# Patient Record
Sex: Male | Born: 1945 | Race: Black or African American | Hispanic: No | Marital: Married | Smoking: Former smoker
Health system: Southern US, Community
[De-identification: ages and names within clinical notes are randomized; demographics above are authoritative.]

## PROBLEM LIST (undated history)

## (undated) DIAGNOSIS — R011 Cardiac murmur, unspecified: Secondary | ICD-10-CM

## (undated) DIAGNOSIS — N2889 Other specified disorders of kidney and ureter: Secondary | ICD-10-CM

## (undated) DIAGNOSIS — G629 Polyneuropathy, unspecified: Secondary | ICD-10-CM

## (undated) DIAGNOSIS — E878 Other disorders of electrolyte and fluid balance, not elsewhere classified: Secondary | ICD-10-CM

## (undated) DIAGNOSIS — E119 Type 2 diabetes mellitus without complications: Secondary | ICD-10-CM

## (undated) DIAGNOSIS — C9000 Multiple myeloma not having achieved remission: Secondary | ICD-10-CM

## (undated) DIAGNOSIS — D649 Anemia, unspecified: Secondary | ICD-10-CM

## (undated) DIAGNOSIS — Z95810 Presence of automatic (implantable) cardiac defibrillator: Secondary | ICD-10-CM

## (undated) DIAGNOSIS — I509 Heart failure, unspecified: Secondary | ICD-10-CM

## (undated) DIAGNOSIS — D472 Monoclonal gammopathy: Secondary | ICD-10-CM

## (undated) DIAGNOSIS — I447 Left bundle-branch block, unspecified: Secondary | ICD-10-CM

## (undated) DIAGNOSIS — N189 Chronic kidney disease, unspecified: Secondary | ICD-10-CM

## (undated) DIAGNOSIS — C801 Malignant (primary) neoplasm, unspecified: Secondary | ICD-10-CM

## (undated) DIAGNOSIS — I10 Essential (primary) hypertension: Secondary | ICD-10-CM

## (undated) DIAGNOSIS — Z950 Presence of cardiac pacemaker: Secondary | ICD-10-CM

## (undated) DIAGNOSIS — I499 Cardiac arrhythmia, unspecified: Secondary | ICD-10-CM

## (undated) DIAGNOSIS — I240 Acute coronary thrombosis not resulting in myocardial infarction: Secondary | ICD-10-CM

## (undated) HISTORY — DX: Malignant (primary) neoplasm, unspecified: C80.1

## (undated) HISTORY — PX: CARDIAC SURGERY: SHX584

## (undated) HISTORY — PX: OTHER SURGICAL HISTORY: SHX169

## (undated) HISTORY — PX: PACEMAKER PLACEMENT: SHX43

## (undated) HISTORY — PX: CORONARY ARTERY BYPASS GRAFT: SHX141

---

## 2005-10-22 ENCOUNTER — Ambulatory Visit: Payer: Self-pay | Admitting: Family Medicine

## 2010-06-28 ENCOUNTER — Emergency Department: Payer: Self-pay | Admitting: Emergency Medicine

## 2011-05-28 DIAGNOSIS — I7090 Unspecified atherosclerosis: Secondary | ICD-10-CM | POA: Insufficient documentation

## 2012-03-17 DIAGNOSIS — H3500 Unspecified background retinopathy: Secondary | ICD-10-CM | POA: Insufficient documentation

## 2012-03-17 DIAGNOSIS — I10 Essential (primary) hypertension: Secondary | ICD-10-CM | POA: Insufficient documentation

## 2012-03-17 DIAGNOSIS — R809 Proteinuria, unspecified: Secondary | ICD-10-CM | POA: Insufficient documentation

## 2012-03-17 DIAGNOSIS — I447 Left bundle-branch block, unspecified: Secondary | ICD-10-CM | POA: Insufficient documentation

## 2012-03-17 DIAGNOSIS — R972 Elevated prostate specific antigen [PSA]: Secondary | ICD-10-CM | POA: Insufficient documentation

## 2012-03-18 DIAGNOSIS — Z7689 Persons encountering health services in other specified circumstances: Secondary | ICD-10-CM | POA: Insufficient documentation

## 2012-03-18 DIAGNOSIS — D649 Anemia, unspecified: Secondary | ICD-10-CM | POA: Insufficient documentation

## 2012-03-18 DIAGNOSIS — N390 Urinary tract infection, site not specified: Secondary | ICD-10-CM | POA: Insufficient documentation

## 2012-03-18 DIAGNOSIS — A419 Sepsis, unspecified organism: Secondary | ICD-10-CM | POA: Insufficient documentation

## 2012-04-26 ENCOUNTER — Emergency Department: Payer: Self-pay | Admitting: Internal Medicine

## 2012-05-06 ENCOUNTER — Emergency Department: Payer: Self-pay | Admitting: Internal Medicine

## 2012-05-06 LAB — URINALYSIS, COMPLETE
Bacteria: NONE SEEN
Blood: NEGATIVE
Glucose,UR: NEGATIVE mg/dL (ref 0–75)
Ketone: NEGATIVE
Leukocyte Esterase: NEGATIVE
Nitrite: NEGATIVE
Protein: NEGATIVE
Specific Gravity: 1.009 (ref 1.003–1.030)
WBC UR: <1 /HPF (ref 0–5)

## 2012-06-12 DIAGNOSIS — R972 Elevated prostate specific antigen [PSA]: Secondary | ICD-10-CM | POA: Insufficient documentation

## 2012-06-12 DIAGNOSIS — N400 Enlarged prostate without lower urinary tract symptoms: Secondary | ICD-10-CM | POA: Insufficient documentation

## 2012-10-27 ENCOUNTER — Ambulatory Visit: Payer: Self-pay | Admitting: Family Medicine

## 2012-12-24 ENCOUNTER — Ambulatory Visit: Payer: Self-pay | Admitting: Unknown Physician Specialty

## 2013-01-01 DIAGNOSIS — K635 Polyp of colon: Secondary | ICD-10-CM | POA: Insufficient documentation

## 2014-05-11 DIAGNOSIS — I420 Dilated cardiomyopathy: Secondary | ICD-10-CM | POA: Insufficient documentation

## 2014-05-21 ENCOUNTER — Ambulatory Visit: Payer: Self-pay | Admitting: Family Medicine

## 2014-09-02 ENCOUNTER — Ambulatory Visit: Payer: Self-pay | Admitting: Physician Assistant

## 2014-10-18 DIAGNOSIS — N390 Urinary tract infection, site not specified: Secondary | ICD-10-CM | POA: Insufficient documentation

## 2014-10-18 DIAGNOSIS — N179 Acute kidney failure, unspecified: Secondary | ICD-10-CM | POA: Insufficient documentation

## 2014-10-18 DIAGNOSIS — N12 Tubulo-interstitial nephritis, not specified as acute or chronic: Secondary | ICD-10-CM | POA: Insufficient documentation

## 2014-10-25 DIAGNOSIS — I5022 Chronic systolic (congestive) heart failure: Secondary | ICD-10-CM | POA: Insufficient documentation

## 2015-03-02 DIAGNOSIS — I10 Essential (primary) hypertension: Secondary | ICD-10-CM | POA: Insufficient documentation

## 2015-03-22 DIAGNOSIS — I340 Nonrheumatic mitral (valve) insufficiency: Secondary | ICD-10-CM | POA: Insufficient documentation

## 2015-03-30 DIAGNOSIS — D509 Iron deficiency anemia, unspecified: Secondary | ICD-10-CM | POA: Insufficient documentation

## 2015-03-30 DIAGNOSIS — I10 Essential (primary) hypertension: Secondary | ICD-10-CM | POA: Insufficient documentation

## 2015-03-30 DIAGNOSIS — R0989 Other specified symptoms and signs involving the circulatory and respiratory systems: Secondary | ICD-10-CM | POA: Insufficient documentation

## 2015-04-05 ENCOUNTER — Other Ambulatory Visit: Payer: Self-pay | Admitting: Family Medicine

## 2015-04-05 DIAGNOSIS — R0989 Other specified symptoms and signs involving the circulatory and respiratory systems: Secondary | ICD-10-CM

## 2015-04-11 ENCOUNTER — Ambulatory Visit: Payer: Medicare Other

## 2015-04-11 DIAGNOSIS — I5041 Acute combined systolic (congestive) and diastolic (congestive) heart failure: Secondary | ICD-10-CM | POA: Insufficient documentation

## 2015-04-12 DIAGNOSIS — Z95810 Presence of automatic (implantable) cardiac defibrillator: Secondary | ICD-10-CM | POA: Insufficient documentation

## 2015-05-09 DIAGNOSIS — R600 Localized edema: Secondary | ICD-10-CM | POA: Insufficient documentation

## 2015-05-09 DIAGNOSIS — Z95810 Presence of automatic (implantable) cardiac defibrillator: Secondary | ICD-10-CM | POA: Insufficient documentation

## 2015-10-21 ENCOUNTER — Ambulatory Visit
Admission: EM | Admit: 2015-10-21 | Discharge: 2015-10-21 | Disposition: A | Discharge status: Home / Self Care | Payer: Medicare Other | Attending: Family Medicine | Admitting: Family Medicine

## 2015-10-21 DIAGNOSIS — N390 Urinary tract infection, site not specified: Secondary | ICD-10-CM | POA: Diagnosis not present

## 2015-10-21 HISTORY — DX: Type 2 diabetes mellitus without complications: E11.9

## 2015-10-21 HISTORY — DX: Anemia, unspecified: D64.9

## 2015-10-21 HISTORY — DX: Other disorders of electrolyte and fluid balance, not elsewhere classified: E87.8

## 2015-10-21 HISTORY — DX: Polyneuropathy, unspecified: G62.9

## 2015-10-21 HISTORY — DX: Acute coronary thrombosis not resulting in myocardial infarction: I24.0

## 2015-10-21 LAB — URINALYSIS COMPLETE WITH MICROSCOPIC (ARMC ONLY)
BILIRUBIN URINE: NEGATIVE
GLUCOSE, UA: NEGATIVE mg/dL
Hgb urine dipstick: NEGATIVE
KETONES UR: NEGATIVE mg/dL
NITRITE: NEGATIVE
Protein, ur: 100 mg/dL — AB
RBC / HPF: NONE SEEN RBC/hpf (ref 0–5)
SQUAMOUS EPITHELIAL / LPF: NONE SEEN
Specific Gravity, Urine: 1.015 (ref 1.005–1.030)
pH: 7 (ref 5.0–8.0)

## 2015-10-21 MED ORDER — CIPROFLOXACIN HCL 500 MG PO TABS: 500.0000 mg | ORAL_TABLET | Freq: Two times a day (BID) | ORAL | Status: DC

## 2015-10-21 NOTE — ED Provider Notes (Signed)
CSN: [id number]     Arrival date & time 10/21/15  0809 History   First MD Initiated Contact with Patient 10/21/15 0848     Chief Complaint  Patient presents with  . Hematuria   (Consider location/radiation/quality/duration/timing/severity/associated sxs/prior Treatment) Patient is a 70 y.o. male presenting with hematuria.  Hematuria This is a new problem. The current episode started yesterday. Episode frequency: intermittently. Pertinent negatives include no chest pain, no abdominal pain, no headaches and no shortness of breath. Associated symptoms comments: Denies abdominal pain, fevers, chills, flank pain, dysuria.    Past Medical History  Diagnosis Date  . Diabetes mellitus without complication ([place])   . Anemia   . Blockage of coronary artery of heart ([place])   . Hypochloremia   . Neuropathy [facility])    Past Surgical History  Procedure Laterality Date  . Cardiac surgery    . Heart bypass    . Pacemaker placement     Family History  Problem Relation Age of Onset  . Heart failure Paternal Grandmother    Social History  Substance Use Topics  . Smoking status: Former Research scientist (life sciences)  . Smokeless tobacco: Never Used  . Alcohol Use: No    Review of Systems  Respiratory: Negative for shortness of breath.   Cardiovascular: Negative for chest pain.  Gastrointestinal: Negative for abdominal pain.  Genitourinary: Positive for hematuria.  Neurological: Negative for headaches.    Allergies  Review of patient's allergies indicates no known allergies.  Home Medications   Prior to Admission medications   Medication Sig Start Date End Date Taking? Authorizing Provider  aspirin 81 MG tablet Take 81 mg by mouth daily.   Yes Historical Provider, MD  atorvastatin (LIPITOR) 10 MG tablet Take 10 mg by mouth daily.   Yes Historical Provider, MD  carvedilol (COREG) 25 MG tablet Take 25 mg by mouth 2 (two) times daily with a meal.   Yes Historical Provider, MD  Cyanocobalamin (VITAMIN B-12 PO)  Take by mouth.   Yes Historical Provider, MD  ferrous gluconate (FERGON) 324 MG tablet Take 324 mg by mouth daily with breakfast.   Yes Historical Provider, MD  furosemide (LASIX) 20 MG tablet Take 20 mg by mouth.   Yes Historical Provider, MD  glimepiride (AMARYL) 4 MG tablet Take 4 mg by mouth daily with breakfast.   Yes Historical Provider, MD  hydrALAZINE (APRESOLINE) 25 MG tablet Take 25 mg by mouth 3 (three) times daily.   Yes Historical Provider, MD  isosorbide dinitrate (ISORDIL) 10 MG tablet Take 10 mg by mouth 3 (three) times daily.   Yes Historical Provider, MD  metFORMIN (GLUCOPHAGE) 1000 MG tablet Take 1,000 mg by mouth 2 (two) times daily with a meal.   Yes Historical Provider, MD  sacubitril-valsartan (ENTRESTO) 49-51 MG Take 1 tablet by mouth 2 (two) times daily.   Yes Historical Provider, MD  sitaGLIPtin (JANUVIA) 100 MG tablet Take 100 mg by mouth daily.   Yes Historical Provider, MD  vitamin C (ASCORBIC ACID) 500 MG tablet Take 500 mg by mouth daily.   Yes Historical Provider, MD  ciprofloxacin (CIPRO) 500 MG tablet Take 1 tablet (500 mg total) by mouth every 12 (twelve) hours. 10/21/15   Norval Gable, MD  gabapentin (NEURONTIN) 100 MG capsule Take 100 mg by mouth as needed.    Historical Provider, MD   Meds Ordered and Administered this Visit  Medications - No data to display  BP 102/60 mmHg  Pulse 80  Temp(Src) 98 F (36.7 C) (  Oral)  Resp 17  Ht 6' (1.829 m)  Wt 195 lb (88.451 kg)  BMI 26.44 kg/m2  SpO2 97% No data found.   Physical Exam  Constitutional: He is oriented to person, place, and time. He appears well-developed and well-nourished. No distress.  HENT:  Head: Normocephalic and atraumatic.  Cardiovascular: Normal rate.   Pulmonary/Chest: Effort normal. No respiratory distress.  Abdominal: Soft. Bowel sounds are normal. He exhibits no distension and no mass. There is no tenderness. There is no rebound and no guarding.  Neurological: He is alert and  oriented to person, place, and time.  Skin: No rash noted. He is not diaphoretic.  Nursing note and vitals reviewed.   ED Course  Procedures (including critical care time)  Labs Review Labs Reviewed  URINALYSIS COMPLETEWITH MICROSCOPIC ([facility] ONLY) - Abnormal; Notable for the following:    APPearance HAZY (*)    Protein, ur 100 (*)    Leukocytes, UA 1+ (*)    Bacteria, UA MANY (*)    All other components within normal limits  URINE CULTURE    Imaging Review No results found.   Visual Acuity Review  Right Eye Distance:   Left Eye Distance:   Bilateral Distance:    Right Eye Near:   Left Eye Near:    Bilateral Near:         MDM   1. UTI (lower urinary tract infection)    Discharge Medication List as of 10/21/2015  9:18 AM    START taking these medications   Details  ciprofloxacin (CIPRO) 500 MG tablet Take 1 tablet (500 mg total) by mouth every 12 (twelve) hours., Starting 10/21/2015, Until Discontinued, Normal       1. Lab results and diagnosis reviewed with patient 2. rx as per orders above; reviewed possible side effects, interactions, risks and benefits  3. Recommend supportive treatment with increased water intake 4. Follow-up prn if symptoms worsen or don't improve    Norval Gable, MD 10/21/15 [phone]

## 2015-10-21 NOTE — ED Notes (Signed)
Patient c/o blood in his urine which he noticed yesterday afternoon.  However, he states that this morning he did not notice any blood in his urine.

## 2015-10-24 LAB — URINE CULTURE: Culture: >=100000

## 2016-07-02 ENCOUNTER — Other Ambulatory Visit (INDEPENDENT_AMBULATORY_CARE_PROVIDER_SITE_OTHER): Payer: Self-pay | Admitting: Vascular Surgery

## 2016-07-02 DIAGNOSIS — M79604 Pain in right leg: Secondary | ICD-10-CM

## 2016-07-02 DIAGNOSIS — M79605 Pain in left leg: Secondary | ICD-10-CM

## 2016-07-02 DIAGNOSIS — I739 Peripheral vascular disease, unspecified: Secondary | ICD-10-CM

## 2016-07-08 ENCOUNTER — Ambulatory Visit (INDEPENDENT_AMBULATORY_CARE_PROVIDER_SITE_OTHER): Payer: Medicare Other

## 2016-07-08 ENCOUNTER — Ambulatory Visit (INDEPENDENT_AMBULATORY_CARE_PROVIDER_SITE_OTHER): Payer: Medicare Other | Admitting: Vascular Surgery

## 2016-07-08 ENCOUNTER — Encounter (INDEPENDENT_AMBULATORY_CARE_PROVIDER_SITE_OTHER): Payer: Self-pay | Admitting: Vascular Surgery

## 2016-07-08 DIAGNOSIS — E785 Hyperlipidemia, unspecified: Secondary | ICD-10-CM | POA: Insufficient documentation

## 2016-07-08 DIAGNOSIS — I739 Peripheral vascular disease, unspecified: Secondary | ICD-10-CM

## 2016-07-08 DIAGNOSIS — M79604 Pain in right leg: Secondary | ICD-10-CM | POA: Diagnosis not present

## 2016-07-08 DIAGNOSIS — E782 Mixed hyperlipidemia: Secondary | ICD-10-CM | POA: Diagnosis not present

## 2016-07-08 DIAGNOSIS — I25118 Atherosclerotic heart disease of native coronary artery with other forms of angina pectoris: Secondary | ICD-10-CM | POA: Diagnosis not present

## 2016-07-08 DIAGNOSIS — I70213 Atherosclerosis of native arteries of extremities with intermittent claudication, bilateral legs: Secondary | ICD-10-CM | POA: Diagnosis not present

## 2016-07-08 DIAGNOSIS — E1151 Type 2 diabetes mellitus with diabetic peripheral angiopathy without gangrene: Secondary | ICD-10-CM | POA: Diagnosis not present

## 2016-07-08 DIAGNOSIS — E119 Type 2 diabetes mellitus without complications: Secondary | ICD-10-CM | POA: Insufficient documentation

## 2016-07-08 DIAGNOSIS — I2510 Atherosclerotic heart disease of native coronary artery without angina pectoris: Secondary | ICD-10-CM | POA: Insufficient documentation

## 2016-07-08 DIAGNOSIS — I70219 Atherosclerosis of native arteries of extremities with intermittent claudication, unspecified extremity: Secondary | ICD-10-CM | POA: Insufficient documentation

## 2016-07-08 DIAGNOSIS — M79605 Pain in left leg: Secondary | ICD-10-CM | POA: Diagnosis not present

## 2016-07-08 NOTE — Progress Notes (Signed)
MRN : [id number]  Adam Wall is a 70 y.o. ([birth year]) male who presents with chief complaint of  Chief Complaint  Patient presents with  . Follow-up  .  History of Present Illness:The patient returns to the office for followup and review of the noninvasive studies. There have been no interval changes in lower extremity symptoms. No interval shortening of the patient's claudication distance or development of rest pain symptoms. No new ulcers or wounds have occurred since the last visit.  There have been no significant changes to the patient's overall health care.  The patient denies amaurosis fugax or recent TIA symptoms. There are no recent neurological changes noted. The patient denies history of DVT, PE or superficial thrombophlebitis. The patient denies recent episodes of angina or shortness of breath.     Current Meds  Medication Sig  . aspirin 81 MG tablet Take 81 mg by mouth daily.  Marland Kitchen atorvastatin (LIPITOR) 10 MG tablet Take 10 mg by mouth daily.  . carvedilol (COREG) 25 MG tablet Take 25 mg by mouth 2 (two) times daily with a meal.  . Cyanocobalamin (VITAMIN B-12 PO) Take by mouth.  . ferrous gluconate (FERGON) 324 MG tablet Take 324 mg by mouth daily with breakfast.  . furosemide (LASIX) 20 MG tablet Take 20 mg by mouth.  . gabapentin (NEURONTIN) 100 MG capsule Take 100 mg by mouth as needed.  Marland Kitchen glimepiride (AMARYL) 4 MG tablet Take 4 mg by mouth daily with breakfast.  . hydrALAZINE (APRESOLINE) 25 MG tablet Take 25 mg by mouth 3 (three) times daily.  . isosorbide dinitrate (ISORDIL) 10 MG tablet Take 10 mg by mouth 3 (three) times daily.  . metFORMIN (GLUCOPHAGE) 1000 MG tablet Take 1,000 mg by mouth 2 (two) times daily with a meal.  . sacubitril-valsartan (ENTRESTO) 49-51 MG Take 1 tablet by mouth 2 (two) times daily.  . sitaGLIPtin (JANUVIA) 100 MG tablet Take 100 mg by mouth daily.  . vitamin C (ASCORBIC ACID) 500 MG tablet Take 500 mg by mouth daily.     Past Medical History:  Diagnosis Date  . Anemia   . Blockage of coronary artery of heart ([place])   . Diabetes mellitus without complication ([place])   . Hypochloremia   . Neuropathy [facility])     Past Surgical History:  Procedure Laterality Date  . CARDIAC SURGERY    . Heart Bypass    . PACEMAKER PLACEMENT      Social History Social History  Substance Use Topics  . Smoking status: Former Research scientist (life sciences)  . Smokeless tobacco: Never Used  . Alcohol use No    Family History Family History  Problem Relation Age of Onset  . Heart failure Paternal Grandmother     No Known Allergies   REVIEW OF SYSTEMS (Negative unless checked)  Constitutional: [] Weight loss  [] Fever  [] Chills Cardiac: [] Chest pain   [] Chest pressure   [] Palpitations   [] Shortness of breath when laying flat   [] Shortness of breath with exertion. Vascular:  [] Pain in legs with walking   [] Pain in legs at rest  [] History of DVT   [] Phlebitis   [] Swelling in legs   [] Varicose veins   [] Non-healing ulcers Pulmonary:   [] Uses home oxygen   [] Productive cough   [] Hemoptysis   [] Wheeze  [] COPD   [] Asthma Neurologic:  [] Dizziness   [] Seizures   [] History of stroke   [] History of TIA  [] Aphasia   [] Vissual changes   [] Weakness or numbness in arm   []   Weakness or numbness in leg Musculoskeletal:   [] Joint swelling   [] Joint pain   [] Low back pain Hematologic:  [] Easy bruising  [] Easy bleeding   [] Hypercoagulable state   [] Anemic Gastrointestinal:  [] Diarrhea   [] Vomiting  [] Gastroesophageal reflux/heartburn   [] Difficulty swallowing. Genitourinary:  [] Chronic kidney disease   [] Difficult urination  [] Frequent urination   [] Blood in urine Skin:  [] Rashes   [] Ulcers  Psychological:  [] History of anxiety   []  History of major depression.  Physical Examination  Vitals:   07/08/16 0912  BP: (!) 141/72  Pulse: 76  Resp: 17  Weight: 194 lb (88 kg)  Height: 6' (1.829 m)   Body mass index is 26.31 kg/m. Gen: WD/WN, NAD Head:  [place]/AT, No temporalis wasting.  Ear/Nose/Throat: Hearing grossly intact, nares w/o erythema or drainage, poor dentition Eyes: PER, EOMI, sclera nonicteric.  Neck: Supple, no masses.  No bruit or JVD.  Pulmonary:  Good air movement, clear to auscultation bilaterally, no use of accessory muscles.  Cardiac: RRR, normal S1, S2, no Murmurs. Vascular:  Vessel Right Left  Radial Palpable Palpable  Ulnar Palpable Palpable  Brachial Palpable Palpable  Carotid Palpable Palpable  Femoral Palpable Palpable  Popliteal Not Palpable Not Palpable  PT Not Palpable Not Palpable  DP Not Palpable Not Palpable   Gastrointestinal: soft, non-distended. No guarding/no peritoneal signs.  Musculoskeletal: M/S 5/5 throughout.  No deformity or atrophy.  Neurologic: CN 2-12 intact. Pain and light touch intact in extremities.  Symmetrical.  Speech is fluent. Motor exam as listed above. Psychiatric: Judgment intact, Mood & affect appropriate for pt's clinical situation. Dermatologic: No rashes or ulcers noted.  No changes consistent with cellulitis. Lymph : No Cervical lymphadenopathy, no lichenification or skin changes of chronic lymphedema.  CBC No results found for: WBC, HGB, HCT, MCV, PLT  BMET No results found for: NA, K, CL, CO2, GLUCOSE, BUN, CREATININE, CALCIUM, GFRNONAA, GFRAA CrCl cannot be calculated (No order found.).  COAG No results found for: INR, PROTIME  Radiology No results found.  Assessment/Plan 1. Atherosclerosis of native artery of both lower extremities with intermittent claudication (HCC)  Recommend:  The patient has evidence of atherosclerosis of the lower extremities with claudication.  The patient does not voice lifestyle limiting changes at this point in time.  Noninvasive studies do not suggest clinically significant change.  No invasive studies, angiography or surgery at this time The patient should continue walking and begin a more formal exercise program.  The patient  should continue antiplatelet therapy and aggressive treatment of the lipid abnormalities  No changes in the patient's medications at this time  The patient should continue wearing graduated compression socks 10-15 mmHg strength to control the mild edema.   - VAS [place] ABI WITH/WO TBI; Future - VAS [place] LOWER EXTREMITY ARTERIAL DUPLEX; Future  2. Mixed hyperlipidemia continue statin  3. 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  4. Type 2 diabetes mellitus with diabetic peripheral angiopathy without gangrene, without long-term current use of insulin ([place]) Continue hypoglycemic medications as already ordered and reviewed, no changes at this time.  Hortencia Pilar, MD  07/08/2016 1:07 PM

## 2016-07-08 NOTE — Progress Notes (Deleted)
rm 3

## 2016-07-19 ENCOUNTER — Ambulatory Visit
Admission: EM | Admit: 2016-07-19 | Discharge: 2016-07-19 | Disposition: A | Discharge status: Home / Self Care | Payer: Medicare Other | Attending: Family Medicine | Admitting: Family Medicine

## 2016-07-19 ENCOUNTER — Encounter: Payer: Self-pay | Admitting: *Deleted

## 2016-07-19 DIAGNOSIS — R059 Cough, unspecified: Secondary | ICD-10-CM

## 2016-07-19 DIAGNOSIS — R05 Cough: Secondary | ICD-10-CM | POA: Diagnosis not present

## 2016-07-19 DIAGNOSIS — R1084 Generalized abdominal pain: Secondary | ICD-10-CM

## 2016-07-19 LAB — URINALYSIS COMPLETE WITH MICROSCOPIC (ARMC ONLY)
Bilirubin Urine: NEGATIVE
GLUCOSE, UA: NEGATIVE mg/dL
HGB URINE DIPSTICK: NEGATIVE
Ketones, ur: NEGATIVE mg/dL
NITRITE: NEGATIVE
PH: 6.5 (ref 5.0–8.0)
Protein, ur: 100 mg/dL — AB
SPECIFIC GRAVITY, URINE: 1.015 (ref 1.005–1.030)
Squamous Epithelial / LPF: NONE SEEN

## 2016-07-19 LAB — GLUCOSE, CAPILLARY: GLUCOSE-CAPILLARY: 176 mg/dL — AB (ref 65–99)

## 2016-07-19 MED ORDER — ONDANSETRON HCL 8 MG PO TABS: 8.0000 mg | ORAL_TABLET | Freq: Three times a day (TID) | ORAL | 0 refills | Status: AC | PRN

## 2016-07-19 MED ORDER — ONDANSETRON 8 MG PO TBDP
8.0000 mg | ORAL_TABLET | Freq: Once | ORAL | Status: AC
  Administered 2016-07-19: 8 mg via ORAL

## 2016-07-19 NOTE — ED Provider Notes (Signed)
CSN: [id number]     Arrival date & time 07/19/16  1125 History   First MD Initiated Contact with Patient 07/19/16 1315     Chief Complaint  Patient presents with  . Abdominal Pain  . Cough  . Urinary Frequency   (Consider location/radiation/quality/duration/timing/severity/associated sxs/prior Treatment) Patient also is concern for pneumonia because of "this cough". Patient wants to make sure he is okay and wants to "catch it early" if we find anything. He reports the Coricidin is helping the cough.    The history is provided by the patient.  Abdominal Pain  Pain location:  Generalized Pain quality: aching   Pain radiates to:  Does not radiate Pain severity now: 4/10. Onset quality:  Sudden Duration:  2 days Timing: Constant last night, intermittent today. Progression:  Unable to specify Chronicity:  New Context: awakening from sleep   Context: not alcohol use, not diet changes, not previous surgeries, not recent travel, not sick contacts and not suspicious food intake   Relieved by:  Nothing Worsened by:  Nothing Ineffective treatments:  Acetaminophen (and corcidin) Associated symptoms: chills, cough, fatigue and nausea   Associated symptoms: no anorexia, no belching, no chest pain, no constipation, no diarrhea, no dysuria, no fever, no melena, no shortness of breath, no sore throat and no vomiting     Past Medical History:  Diagnosis Date  . Anemia   . Blockage of coronary artery of heart ([place])   . Diabetes mellitus without complication ([place])   . Hypochloremia   . Neuropathy [facility])    Past Surgical History:  Procedure Laterality Date  . CARDIAC SURGERY    . Heart Bypass    . PACEMAKER PLACEMENT     Family History  Problem Relation Age of Onset  . Heart failure Paternal Grandmother    Social History  Substance Use Topics  . Smoking status: Former Research scientist (life sciences)  . Smokeless tobacco: Never Used  . Alcohol use No    Review of Systems  Constitutional: Positive for  chills and fatigue. Negative for fever.  HENT: Negative for congestion, ear pain, rhinorrhea, sinus pain, sneezing and sore throat.   Respiratory: Positive for cough. Negative for shortness of breath.   Cardiovascular: Negative for chest pain and palpitations.  Gastrointestinal: Positive for abdominal pain and nausea. Negative for anorexia, constipation, diarrhea, melena and vomiting.       -blood in stool  Genitourinary: Negative for dysuria, frequency and urgency.  Neurological: Positive for light-headedness. Negative for headaches.    Allergies  Patient has no known allergies.  Home Medications   Prior to Admission medications   Medication Sig Start Date End Date Taking? Authorizing Provider  aspirin 81 MG tablet Take 81 mg by mouth daily.   Yes Historical Provider, MD  atorvastatin (LIPITOR) 10 MG tablet Take 10 mg by mouth daily.   Yes Historical Provider, MD  Cyanocobalamin (VITAMIN B-12 PO) Take by mouth.   Yes Historical Provider, MD  ferrous gluconate (FERGON) 324 MG tablet Take 324 mg by mouth daily with breakfast.   Yes Historical Provider, MD  furosemide (LASIX) 20 MG tablet Take 20 mg by mouth.   Yes Historical Provider, MD  gabapentin (NEURONTIN) 100 MG capsule Take 100 mg by mouth as needed.   Yes Historical Provider, MD  glimepiride (AMARYL) 4 MG tablet Take 4 mg by mouth daily with breakfast.   Yes Historical Provider, MD  hydrALAZINE (APRESOLINE) 25 MG tablet Take 25 mg by mouth 3 (three) times daily.   Yes  Historical Provider, MD  isosorbide dinitrate (ISORDIL) 10 MG tablet Take 10 mg by mouth 3 (three) times daily.   Yes Historical Provider, MD  metFORMIN (GLUCOPHAGE) 1000 MG tablet Take 1,000 mg by mouth 2 (two) times daily with a meal.   Yes Historical Provider, MD  sacubitril-valsartan (ENTRESTO) 49-51 MG Take 1 tablet by mouth 2 (two) times daily.   Yes Historical Provider, MD  sitaGLIPtin (JANUVIA) 100 MG tablet Take 100 mg by mouth daily.   Yes Historical  Provider, MD  vitamin C (ASCORBIC ACID) 500 MG tablet Take 500 mg by mouth daily.   Yes Historical Provider, MD  carvedilol (COREG) 25 MG tablet Take 25 mg by mouth 2 (two) times daily with a meal.    Historical Provider, MD  ciprofloxacin (CIPRO) 500 MG tablet Take 1 tablet (500 mg total) by mouth every 12 (twelve) hours. Patient not taking: Reported on 07/08/2016 10/21/15   Norval Gable, MD   Meds Ordered and Administered this Visit   Medications  ondansetron (ZOFRAN-ODT) disintegrating tablet 8 mg (not administered)    BP (!) 164/68 (BP Location: Left Arm)   Pulse 74   Temp 98 F (36.7 C) (Oral)   Resp 16   Ht 6' (1.829 m)   Wt 195 lb (88.5 kg)   SpO2 98%   BMI 26.45 kg/m  No data found.   Physical Exam  Constitutional: He is oriented to person, place, and time. He appears well-developed and well-nourished.  HENT:  Head: Normocephalic and atraumatic.  Right Ear: External ear normal.  Left Ear: External ear normal.  Nose: Nose normal.  Mouth/Throat: Oropharynx is clear and moist. No oropharyngeal exudate.  TM Normal bilaterally  Eyes: Conjunctivae and EOM are normal. Pupils are equal, round, and reactive to light.  Neck: Normal range of motion. Neck supple.  Cardiovascular: Normal rate, regular rhythm and intact distal pulses.   Murmur heard. Pulmonary/Chest: Effort normal and breath sounds normal. No respiratory distress. He has no wheezes.  Abdominal: Soft. Bowel sounds are normal. He exhibits no distension and no mass. There is no tenderness.  Lymphadenopathy:    He has no cervical adenopathy.  Neurological: He is alert and oriented to person, place, and time.  Skin: Skin is warm and dry.  Nursing note and vitals reviewed.   Urgent Care Course   Clinical Course     Procedures (including critical care time)  Labs Review Labs Reviewed  URINALYSIS COMPLETEWITH MICROSCOPIC ([facility] ONLY) - Abnormal; Notable for the following:       Result Value   Protein, ur  100 (*)    Leukocytes, UA SMALL (*)    Bacteria, UA RARE (*)    All other components within normal limits  GLUCOSE, CAPILLARY - Abnormal; Notable for the following:    Glucose-Capillary 176 (*)    All other components within normal limits  URINE CULTURE    Imaging Review No results found.   MDM   1. Generalized abdominal pain   2. Coughing    UA has Macroalbuminuria, small Leukocytes and Rare amt of Bacteria. His PCP already knows about the proteinuria.  Doubt UTI as he does not have dysuria, frequency or urgency. Will send urine off for urine culture  Abdominal pain and nausea most likely viral in etiology. RX for Zofran given. Informed to continue to monitor his abdominal pain at home. F/u with PCP next week if it does not improve, and ER return precaution strictly discussed.   May continue to take  Coricidin for cough. No respiratory abnormality noted on exam. Has no concern for pneumonia at this time.     Barry Dienes, NP 07/19/16 1408

## 2016-07-19 NOTE — ED Triage Notes (Signed)
Onset yesterday of diffuse abd pain and urinary frequency. Also c/o productive cough- clear. Denies N/V/D, & fever.

## 2016-07-19 NOTE — Discharge Instructions (Signed)
Follow up with your regular doctor next week if you pain does not improve. If it gets worst, then you need to go to the ER.

## 2016-07-23 LAB — URINE CULTURE: Culture: >=100000 — AB

## 2016-07-25 ENCOUNTER — Telehealth: Payer: Self-pay | Admitting: *Deleted

## 2016-07-25 MED ORDER — CIPROFLOXACIN HCL 500 MG PO TABS: 500.0000 mg | ORAL_TABLET | Freq: Two times a day (BID) | ORAL | 0 refills | Status: AC

## 2016-07-25 NOTE — Telephone Encounter (Signed)
Called patient, verified DOB, communicated positive for bacteria urine culture result. Directed patient to go to pharmacy on record to pick up prescription for antibiotic. Patient did report feeling about the same, and confirmed understanding of test results and antibiotic availability.

## 2016-10-04 ENCOUNTER — Inpatient Hospital Stay: Payer: Medicare Other

## 2016-10-04 ENCOUNTER — Encounter: Payer: Self-pay | Admitting: Oncology

## 2016-10-04 ENCOUNTER — Ambulatory Visit
Admission: RE | Admit: 2016-10-04 | Discharge: 2016-10-04 | Disposition: A | Discharge status: Home / Self Care | Payer: Medicare Other | Source: Ambulatory Visit | Attending: Oncology | Admitting: Oncology

## 2016-10-04 ENCOUNTER — Inpatient Hospital Stay: Payer: Medicare Other | Attending: Oncology | Admitting: Oncology

## 2016-10-04 VITALS — BP 159/84 | HR 80 | Temp 97.8°F | Ht 72.0 in | Wt 195.5 lb

## 2016-10-04 DIAGNOSIS — D509 Iron deficiency anemia, unspecified: Secondary | ICD-10-CM | POA: Insufficient documentation

## 2016-10-04 DIAGNOSIS — E878 Other disorders of electrolyte and fluid balance, not elsewhere classified: Secondary | ICD-10-CM | POA: Insufficient documentation

## 2016-10-04 DIAGNOSIS — D472 Monoclonal gammopathy: Secondary | ICD-10-CM | POA: Insufficient documentation

## 2016-10-04 DIAGNOSIS — E119 Type 2 diabetes mellitus without complications: Secondary | ICD-10-CM | POA: Diagnosis not present

## 2016-10-04 DIAGNOSIS — G629 Polyneuropathy, unspecified: Secondary | ICD-10-CM | POA: Diagnosis not present

## 2016-10-04 DIAGNOSIS — C9000 Multiple myeloma not having achieved remission: Secondary | ICD-10-CM | POA: Diagnosis present

## 2016-10-04 DIAGNOSIS — I2510 Atherosclerotic heart disease of native coronary artery without angina pectoris: Secondary | ICD-10-CM

## 2016-10-04 DIAGNOSIS — E785 Hyperlipidemia, unspecified: Secondary | ICD-10-CM | POA: Insufficient documentation

## 2016-10-04 DIAGNOSIS — I70219 Atherosclerosis of native arteries of extremities with intermittent claudication, unspecified extremity: Secondary | ICD-10-CM | POA: Diagnosis not present

## 2016-10-04 DIAGNOSIS — I509 Heart failure, unspecified: Secondary | ICD-10-CM | POA: Diagnosis not present

## 2016-10-04 DIAGNOSIS — Z7982 Long term (current) use of aspirin: Secondary | ICD-10-CM | POA: Insufficient documentation

## 2016-10-04 DIAGNOSIS — D563 Thalassemia minor: Secondary | ICD-10-CM | POA: Insufficient documentation

## 2016-10-04 DIAGNOSIS — Z7984 Long term (current) use of oral hypoglycemic drugs: Secondary | ICD-10-CM | POA: Insufficient documentation

## 2016-10-04 DIAGNOSIS — I10 Essential (primary) hypertension: Secondary | ICD-10-CM | POA: Insufficient documentation

## 2016-10-04 DIAGNOSIS — Z87891 Personal history of nicotine dependence: Secondary | ICD-10-CM

## 2016-10-04 DIAGNOSIS — Z79899 Other long term (current) drug therapy: Secondary | ICD-10-CM | POA: Insufficient documentation

## 2016-10-04 LAB — COMPREHENSIVE METABOLIC PANEL
ALT: 15 U/L — ABNORMAL LOW (ref 17–63)
ANION GAP: 5 (ref 5–15)
AST: 19 U/L (ref 15–41)
Albumin: 3.3 g/dL — ABNORMAL LOW (ref 3.5–5.0)
Alkaline Phosphatase: 97 U/L (ref 38–126)
BUN: 22 mg/dL — ABNORMAL HIGH (ref 6–20)
CHLORIDE: 103 mmol/L (ref 101–111)
CO2: 27 mmol/L (ref 22–32)
Calcium: 9.2 mg/dL (ref 8.9–10.3)
Creatinine, Ser: 1.15 mg/dL (ref 0.61–1.24)
GFR calc non Af Amer: >60 mL/min (ref >60)
Glucose, Bld: 231 mg/dL — ABNORMAL HIGH (ref 65–99)
POTASSIUM: 4.3 mmol/L (ref 3.5–5.1)
SODIUM: 135 mmol/L (ref 135–145)
Total Bilirubin: 0.6 mg/dL (ref 0.3–1.2)
Total Protein: 8 g/dL (ref 6.5–8.1)

## 2016-10-04 LAB — CBC
HCT: 31.4 % — ABNORMAL LOW (ref 40.0–52.0)
Hemoglobin: 10 g/dL — ABNORMAL LOW (ref 13.0–18.0)
MCH: 21.6 pg — AB (ref 26.0–34.0)
MCHC: 31.9 g/dL — AB (ref 32.0–36.0)
MCV: 67.8 fL — ABNORMAL LOW (ref 80.0–100.0)
PLATELETS: 195 10*3/uL (ref 150–440)
RBC: 4.64 MIL/uL (ref 4.40–5.90)
RDW: 17.4 % — ABNORMAL HIGH (ref 11.5–14.5)
WBC: 4.7 10*3/uL (ref 3.8–10.6)

## 2016-10-04 LAB — IRON AND TIBC
IRON: 39 ug/dL — AB (ref 45–182)
Saturation Ratios: 17 % — ABNORMAL LOW (ref 17.9–39.5)
TIBC: 230 ug/dL — ABNORMAL LOW (ref 250–450)
UIBC: 191 ug/dL

## 2016-10-04 LAB — FOLATE: Folate: 8.9 ng/mL (ref >5.9)

## 2016-10-04 LAB — VITAMIN B12: Vitamin B-12: 331 pg/mL (ref 180–914)

## 2016-10-04 LAB — RETICULOCYTES
RBC.: 4.7 MIL/uL (ref 4.40–5.90)
RETIC CT PCT: 0.8 % (ref 0.4–3.1)
Retic Count, Absolute: 37.6 10*3/uL (ref 19.0–183.0)

## 2016-10-04 LAB — FERRITIN: Ferritin: 22 ng/mL — ABNORMAL LOW (ref 24–336)

## 2016-10-04 LAB — LACTATE DEHYDROGENASE: LDH: 90 U/L — ABNORMAL LOW (ref 98–192)

## 2016-10-04 NOTE — Progress Notes (Signed)
Patient here for initial visit. Patient states that feels tired and has stiffness in joints for the past 3 or four months.

## 2016-10-04 NOTE — Progress Notes (Signed)
Hematology/Oncology Consult note [facility] [phone] [phone]  Patient Care Team: Clarisse Gouge, MD as PCP - General (Family Medicine)   Name of the patient: Adam Wall  [id number]  10/04/1945    Reason for referral- monoclonal gammopathy   Referring physician- Dr. Katy Apo  Date of visit: 10/04/16   History of presenting illness- patient is a 71 year old male with a past medical history significant for congestive heart failure with an ICD in place, hypertension and diabetes among other comorbidities. Recent SPEP on 09/19/2016 revealed a monoclonal M spike of 1.9 g of IgG kappa. Recent CBC showed white count of 5.5, H&H of 10.1/31.9 with an MCV of 69 of note patient has always been anemic with a hemoglobin between 9.6-10.7 over the last 2 years. Also his MCV has always been between 68-70 in the past. Iron studies from 06/13/2016 revealed ferritin of 18 and a low TIBC of 224. 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 CMP has always revealed normal kidney function and normal calcium level.  ECOG PS- 0  Pain scale- 0   Review of systems- Review of Systems  Constitutional: Negative for chills, fever, malaise/fatigue and weight loss.  HENT: Negative for congestion, ear discharge and nosebleeds.   Eyes: Negative for blurred vision.  Respiratory: Negative for cough, hemoptysis, sputum production, shortness of breath and wheezing.   Cardiovascular: Negative for chest pain, palpitations, orthopnea and claudication.  Gastrointestinal: Negative for abdominal pain, blood in stool, constipation, diarrhea, heartburn, melena, nausea and vomiting.  Genitourinary: Negative for dysuria, flank pain, frequency, hematuria and urgency.  Musculoskeletal: Negative for back pain, joint pain and myalgias.  Skin: Negative for rash.  Neurological: Negative for dizziness, tingling, focal weakness, seizures, weakness and headaches.  Endo/Heme/Allergies: Does  not bruise/bleed easily.  Psychiatric/Behavioral: Negative for depression and suicidal ideas. The patient does not have insomnia.     No Known Allergies  Patient Active Problem List   Diagnosis Date Noted  . Atherosclerosis of native arteries of extremity with intermittent claudication ([place]) 07/08/2016  . Hyperlipidemia 07/08/2016  . Coronary atherosclerosis 07/08/2016  . Diabetes ([place]) 07/08/2016     Past Medical History:  Diagnosis Date  . Anemia   . Blockage of coronary artery of heart ([place])   . Diabetes mellitus without complication ([place])   . Hypochloremia   . Neuropathy [facility])      Past Surgical History:  Procedure Laterality Date  . CARDIAC SURGERY    . Heart Bypass    . PACEMAKER PLACEMENT      Social History   Social History  . Marital status: Married    Spouse name: N/A  . Number of children: N/A  . Years of education: N/A   Occupational History  . Not on file.   Social History Main Topics  . Smoking status: Former Research scientist (life sciences)  . Smokeless tobacco: Never Used  . Alcohol use No  . Drug use: No  . Sexual activity: Not on file   Other Topics Concern  . Not on file   Social History Narrative  . No narrative on file     Family History  Problem Relation Age of Onset  . Heart failure Paternal Grandmother      Current Outpatient Prescriptions:  .  aspirin 81 MG tablet, Take 81 mg by mouth daily., Disp: , Rfl:  .  atorvastatin (LIPITOR) 10 MG tablet, Take 10 mg by mouth daily., Disp: , Rfl:  .  carvedilol (COREG) 25 MG tablet,  Take 25 mg by mouth 2 (two) times daily with a meal., Disp: , Rfl:  .  ciprofloxacin (CIPRO) 500 MG tablet, Take 1 tablet (500 mg total) by mouth every 12 (twelve) hours. (Patient not taking: Reported on 07/08/2016), Disp: 14 tablet, Rfl: 0 .  Cyanocobalamin (VITAMIN B-12 PO), Take by mouth., Disp: , Rfl:  .  ferrous gluconate (FERGON) 324 MG tablet, Take 324 mg by mouth daily with breakfast., Disp: , Rfl:  .  furosemide (LASIX)  20 MG tablet, Take 20 mg by mouth., Disp: , Rfl:  .  gabapentin (NEURONTIN) 100 MG capsule, Take 100 mg by mouth as needed., Disp: , Rfl:  .  glimepiride (AMARYL) 4 MG tablet, Take 4 mg by mouth daily with breakfast., Disp: , Rfl:  .  hydrALAZINE (APRESOLINE) 25 MG tablet, Take 25 mg by mouth 3 (three) times daily., Disp: , Rfl:  .  isosorbide dinitrate (ISORDIL) 10 MG tablet, Take 10 mg by mouth 3 (three) times daily., Disp: , Rfl:  .  metFORMIN (GLUCOPHAGE) 1000 MG tablet, Take 1,000 mg by mouth 2 (two) times daily with a meal., Disp: , Rfl:  .  sacubitril-valsartan (ENTRESTO) 49-51 MG, Take 1 tablet by mouth 2 (two) times daily., Disp: , Rfl:  .  sitaGLIPtin (JANUVIA) 100 MG tablet, Take 100 mg by mouth daily., Disp: , Rfl:  .  vitamin C (ASCORBIC ACID) 500 MG tablet, Take 500 mg by mouth daily., Disp: , Rfl:    Physical exam:  Vitals:   10/04/16 0851  BP: (!) 159/84  Pulse: 80  Temp: 97.8 F (36.6 C)  TempSrc: Tympanic  Weight: 195 lb 8.8 oz (88.7 kg)  Height: 6' (1.829 m)   Physical Exam  Constitutional: He is oriented to person, place, and time and well-developed, well-nourished, and in no distress.  HENT:  Head: Normocephalic and atraumatic.  Eyes: EOM are normal. Pupils are equal, round, and reactive to light.  Neck: Normal range of motion.  Cardiovascular: Normal rate, regular rhythm and normal heart sounds.   Pulmonary/Chest: Effort normal and breath sounds normal.  Abdominal: Soft. Bowel sounds are normal.  Neurological: He is alert and oriented to person, place, and time.  Skin: Skin is warm and dry.     Assessment and plan- Patient is a 71 y.o. 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 I will also check 24-hour urine protein with UPEP and urine immunofixation. I will also obtain a skeletal survey. Given that his recent  monoclonal protein was more than 1.5 g, I will obtain a bone marrow biopsy as well to rule out multiple myeloma although my suspicion for that is low at this point  2.  Patient does have chronic microcytic anemia which I don't think is related to his MGUS. He is always had a significantly low MCV disproportionate degree of anemia suggestive of thalassemia. I will obtain a complete anemia workup at this time including iron studies, B12, folate, reticulocyte count and hemoglobin electrophoresis   I will see the patient back in 2 weeks' time to discuss the results of his blood work and decide about further management at that time   Thank you for this kind referral and the opportunity to participate in the care of this patient   Visit Diagnosis 1. IgG monoclonal gammopathy   2. MGUS (monoclonal gammopathy of unknown significance)  Dr. Randa Evens, MD, MPH [place] at [facility] Pager- [phone] 10/04/2016 9:44 AM

## 2016-10-05 LAB — BETA 2 MICROGLOBULIN, SERUM: BETA 2 MICROGLOBULIN: 3.3 mg/L — AB (ref 0.6–2.4)

## 2016-10-05 LAB — IGG, IGA, IGM
IGG (IMMUNOGLOBIN G), SERUM: 2920 mg/dL — AB (ref 700–1600)
IgA: 26 mg/dL — ABNORMAL LOW (ref 61–437)
IgM, Serum: 19 mg/dL (ref 15–143)

## 2016-10-06 ENCOUNTER — Other Ambulatory Visit: Payer: Self-pay | Admitting: *Deleted

## 2016-10-06 DIAGNOSIS — D472 Monoclonal gammopathy: Secondary | ICD-10-CM

## 2016-10-07 DIAGNOSIS — C9000 Multiple myeloma not having achieved remission: Secondary | ICD-10-CM | POA: Diagnosis not present

## 2016-10-07 LAB — HEMOGLOBINOPATHY EVALUATION
HGB A2 QUANT: 4.9 % — AB (ref 1.8–3.2)
HGB F QUANT: 3.4 % — AB (ref 0.0–2.0)
HGB S QUANTITAION: 0 %
HGB VARIANT: 0 %
Hgb A: 91.7 % — ABNORMAL LOW (ref 96.4–98.8)
Hgb C: 0 %

## 2016-10-07 LAB — KAPPA/LAMBDA LIGHT CHAINS
KAPPA FREE LGHT CHN: 193.6 mg/L — AB (ref 3.3–19.4)
KAPPA, LAMDA LIGHT CHAIN RATIO: 14.45 — AB (ref 0.26–1.65)
LAMDA FREE LIGHT CHAINS: 13.4 mg/L (ref 5.7–26.3)

## 2016-10-08 ENCOUNTER — Other Ambulatory Visit: Payer: Self-pay

## 2016-10-08 DIAGNOSIS — D472 Monoclonal gammopathy: Secondary | ICD-10-CM

## 2016-10-09 ENCOUNTER — Telehealth: Payer: Self-pay | Admitting: *Deleted

## 2016-10-09 LAB — IFE+PROTEIN ELECTRO, 24-HR UR
% BETA, Urine: 9.7 %
ALPHA 1 URINE: 3.7 %
ALPHA 2 UR: 7.3 %
Albumin, U: 61.4 %
GAMMA GLOBULIN URINE: 17.8 %
M-SPIKE %, URINE: 10.8 % — AB
M-Spike, Mg/24 Hr: 66 mg/24 hr — ABNORMAL HIGH
Total Protein, Urine-Ur/day: 610 mg/24 hr — ABNORMAL HIGH (ref 30–150)
Total Protein, Urine: 46.9 mg/dL
Total Volume: 1300

## 2016-10-09 NOTE — Telephone Encounter (Signed)
Called pt to give date of biopsy which is 10/14/16 and pt to arrive at 8 am for a 9 am procedure.  Patient was told to be NPO after midnight and any meds the morning of procedure to take with sips of water only. He was told that he will need to have a driver to take him home and he is ok with that.  He has an appt.2/27 to see [place] and he is agreeable to the plan

## 2016-10-10 LAB — MULTIPLE MYELOMA PANEL, SERUM
ALBUMIN SERPL ELPH-MCNC: 3.4 g/dL (ref 2.9–4.4)
ALPHA2 GLOB SERPL ELPH-MCNC: 0.7 g/dL (ref 0.4–1.0)
Albumin/Glob SerPl: 0.8 (ref 0.7–1.7)
Alpha 1: 0.3 g/dL (ref 0.0–0.4)
B-Globulin SerPl Elph-Mcnc: 0.8 g/dL (ref 0.7–1.3)
Gamma Glob SerPl Elph-Mcnc: 2.5 g/dL — ABNORMAL HIGH (ref 0.4–1.8)
Globulin, Total: 4.3 g/dL — ABNORMAL HIGH (ref 2.2–3.9)
IGG (IMMUNOGLOBIN G), SERUM: 2875 mg/dL — AB (ref 700–1600)
IgA: 26 mg/dL — ABNORMAL LOW (ref 61–437)
IgM, Serum: 19 mg/dL (ref 15–143)
M PROTEIN SERPL ELPH-MCNC: 2.3 g/dL — AB
TOTAL PROTEIN ELP: 7.7 g/dL (ref 6.0–8.5)

## 2016-10-11 ENCOUNTER — Other Ambulatory Visit: Payer: Self-pay | Admitting: Radiology

## 2016-10-14 ENCOUNTER — Other Ambulatory Visit (HOSPITAL_COMMUNITY)
Admission: RE | Admit: 2016-10-14 | Disposition: A | Discharge status: Home / Self Care | Payer: Medicare Other | Source: Ambulatory Visit | Attending: Oncology | Admitting: Oncology

## 2016-10-14 ENCOUNTER — Ambulatory Visit
Admission: RE | Admit: 2016-10-14 | Discharge: 2016-10-14 | Disposition: A | Discharge status: Home / Self Care | Payer: Medicare Other | Source: Ambulatory Visit | Attending: Oncology | Admitting: Oncology

## 2016-10-14 DIAGNOSIS — D472 Monoclonal gammopathy: Secondary | ICD-10-CM | POA: Insufficient documentation

## 2016-10-14 DIAGNOSIS — D509 Iron deficiency anemia, unspecified: Secondary | ICD-10-CM | POA: Diagnosis not present

## 2016-10-14 LAB — CBC
HCT: 30.6 % — ABNORMAL LOW (ref 40.0–52.0)
Hemoglobin: 9.9 g/dL — ABNORMAL LOW (ref 13.0–18.0)
MCH: 21.8 pg — ABNORMAL LOW (ref 26.0–34.0)
MCHC: 32.4 g/dL (ref 32.0–36.0)
MCV: 67.1 fL — ABNORMAL LOW (ref 80.0–100.0)
PLATELETS: 200 10*3/uL (ref 150–440)
RBC: 4.55 MIL/uL (ref 4.40–5.90)
RDW: 17.6 % — AB (ref 11.5–14.5)
WBC: 6.2 10*3/uL (ref 3.8–10.6)

## 2016-10-14 LAB — PROTIME-INR
INR: 1.01
Prothrombin Time: 13.3 seconds (ref 11.4–15.2)

## 2016-10-14 LAB — DIFFERENTIAL
Basophils Absolute: 0 10*3/uL (ref 0–0.1)
Basophils Relative: 0 %
EOS PCT: 3 %
Eosinophils Absolute: 0.2 10*3/uL (ref 0–0.7)
LYMPHS ABS: 0.9 10*3/uL — AB (ref 1.0–3.6)
Lymphocytes Relative: 15 %
MONO ABS: 0.6 10*3/uL (ref 0.2–1.0)
Monocytes Relative: 10 %
Neutro Abs: 4.4 10*3/uL (ref 1.4–6.5)
Neutrophils Relative %: 72 %

## 2016-10-14 LAB — GLUCOSE, CAPILLARY: GLUCOSE-CAPILLARY: 174 mg/dL — AB (ref 65–99)

## 2016-10-14 LAB — APTT: APTT: 28 s (ref 24–36)

## 2016-10-14 MED ORDER — SODIUM CHLORIDE 0.9 % IV SOLN
INTRAVENOUS | Status: DC
  Administered 2016-10-14: 09:00:00 via INTRAVENOUS

## 2016-10-14 MED ORDER — MIDAZOLAM HCL 2 MG/2ML IJ SOLN
INTRAMUSCULAR | Status: AC | PRN
  Administered 2016-10-14 (×2): 1 mg via INTRAVENOUS

## 2016-10-14 MED ORDER — HYDROCODONE-ACETAMINOPHEN 5-325 MG PO TABS: 1.0000 | ORAL_TABLET | ORAL | Status: DC | PRN

## 2016-10-14 MED ORDER — FENTANYL CITRATE (PF) 100 MCG/2ML IJ SOLN
INTRAMUSCULAR | Status: AC | PRN
  Administered 2016-10-14 (×2): 50 ug via INTRAVENOUS

## 2016-10-14 NOTE — Procedures (Signed)
CT-guided LEFT iliac bone marrow aspiration and core biopsy No complication No blood loss. See complete dictation in [facility] PACS  

## 2016-10-14 NOTE — Discharge Instructions (Signed)
Needle Biopsy of the Bone °Introduction °A bone biopsy is a procedure in which a small sample of bone is removed. The sample is taken with a needle. Then, the bone sample is looked at under a microscope to check for abnormalities. The sample is usually taken from a bone that is close to the skin. This procedure may be done to check for various problems with the bone. You may need this procedure if imaging tests or blood tests have indicated a possible problem. This procedure may be done to help determine if a bone tumor is cancerous (malignant). A bone biopsy can help to diagnose problems such as: °· Tumors of the bone (sarcomas) and bone marrow (multiple myeloma). °· Bone that forms abnormally (Paget disease). °· Noncancerous (benign) bone cysts. °· Bony growths. °· Infections in the bone. °Tell a health care provider about: °· Any allergies you have. °· All medicines you are taking, including vitamins, herbs, eye drops, creams, and over-the-counter medicines. °· Any problems you or family members have had with anesthetic medicines. °· Any blood disorders you have. °· Any surgeries you have had. °· Any medical conditions you have. °What are the risks? °Generally, this is a safe procedure. However, problems may occur, including: °· Excessive bleeding. °· Infection. °· Injury to surrounding tissue. °What happens before the procedure? °· Ask your health care provider about: °¨ Changing or stopping your regular medicines. This is especially important if you are taking diabetes medicines or blood thinners. °¨ Taking medicines such as aspirin and ibuprofen. These medicines can thin your blood. Do not take these medicines before your procedure if your health care provider instructs you not to. °· Follow instructions from your health care provider about eating or drinking restrictions. °· Plan to have someone take you home after the procedure. °· If you go home right after the procedure, plan to have someone with you for  24 hours. °What happens during the procedure? °· An IV tube may be inserted into one of your veins. °· The injection site will be cleaned with a germ-killing solution (antiseptic). °· You will be given one or more of the following: °¨ A medicine to help you relax (sedative). °¨ A medicine to numb the area (local anesthetic). °· The sample of bone will be removed by putting a large needle through the skin and into the bone. °· The needle will be removed. °· A bandage (dressing) will be placed over the insertion site and taped in place. °The procedure may vary among health care providers and hospitals. °What happens after the procedure? °· Your blood pressure, heart rate, breathing rate, and blood oxygen level will be monitored often until the medicines you were given have worn off. °· Return to your normal activities as told by your health care provider. °This information is not intended to replace advice given to you by your health care provider. Make sure you discuss any questions you have with your health care provider. °Document Released: 06/20/2004 Document Revised: 01/18/2016 Document Reviewed: 09/19/2014 °© 2017 Elsevier ° °

## 2016-10-17 LAB — BONE MARROW EXAM

## 2016-10-21 NOTE — Progress Notes (Signed)
Hematology/Oncology Consult note [facility]  [phone] [phone]  Patient Care Team: Clarisse Gouge, MD as PCP - General (Family Medicine)   Name of the patient: Adam Wall  [id number]  Mar 02, 1946   Date of visit: 10/21/16  Diagnosis- smoldering multiple myeloma  Chief complaint/ Reason for visit- discuss results of bloodwork  Heme/Onc history: patient is a 71 year old male with a past medical history significant for congestive heart failure with an ICD in place, hypertension and diabetes among other comorbidities. Recent SPEP on 09/19/2016 revealed a monoclonal M spike of 1.9 g of IgG kappa. Recent CBC showed white count of 5.5, H&H of 10.1/31.9 with an MCV of 69 of note patient has always been anemic with a hemoglobin between 9.6-10.7 over the last 2 years. Also his MCV has always been between 68-70 in the past. Iron studies from 06/13/2016 revealed ferritin of 18 and a low TIBC of 224. 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 CMP has always revealed normal kidney function and normal calcium level.  Results of blood work from 10/04/2016 breast follows: CBC showed white count of 4.7, H&H of 10/31.4 with an MCV of 67.8 and a platelet count of 195. CMP was within normal limits with albumin of 3.3 and a calcium of 9.2. Beta 2 microglobulin was mildly elevated at 3. and LDH was mildly low at 90. Ferritin was low at 22 and iron studies showed a low serum iron of 39 and a TIBC low at 230. 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 B12 level was normal at 331.  Quantitative immunoglobulins revealed an elevated IgG of 2875 and IgG kappa monoclonal M spike of 2.3 g. kappa lambda free light chain ratio was abnormal at 14.45  Bone marrow biopsy showed hypercellular bone marrow with 36% atypical plasma cells. Cytogenetics and FISH studies are currently pending  Skeletal survey showed no definitive evidence of lytic destruction  or lesions in the skeleton.  24 hour urine protein showed 610 mg and M spike of 66 mg    Interval history- doing well. Has chronic back pain and leg pain. unchanged    Review of systems- Review of Systems  Constitutional: Negative for chills, fever, malaise/fatigue and weight loss.  HENT: Negative for congestion, ear discharge and nosebleeds.   Eyes: Negative for blurred vision.  Respiratory: Negative for cough, hemoptysis, sputum production, shortness of breath and wheezing.   Cardiovascular: Negative for chest pain, palpitations, orthopnea and claudication.  Gastrointestinal: Negative for abdominal pain, blood in stool, constipation, diarrhea, heartburn, melena, nausea and vomiting.  Genitourinary: Negative for dysuria, flank pain, frequency, hematuria and urgency.  Musculoskeletal: Positive for back pain. Negative for joint pain and myalgias.       Leg pain  Skin: Negative for rash.  Neurological: Negative for dizziness, tingling, focal weakness, seizures, weakness and headaches.  Endo/Heme/Allergies: Does not bruise/bleed easily.  Psychiatric/Behavioral: Negative for depression and suicidal ideas. The patient does not have insomnia.      Current treatment- observation  No Known Allergies   Past Medical History:  Diagnosis Date  . Anemia   . Blockage of coronary artery of heart ([place])   . Diabetes mellitus without complication ([place])   . Hypochloremia   . Neuropathy [facility])      Past Surgical History:  Procedure Laterality Date  . CARDIAC SURGERY    . Heart Bypass    . PACEMAKER PLACEMENT      Social History   Social History  .  Marital status: Married    Spouse name: N/A  . Number of children: N/A  . Years of education: N/A   Occupational History  . Not on file.   Social History Main Topics  . Smoking status: Former Smoker    Quit date: 10/04/1993  . Smokeless tobacco: Never Used  . Alcohol use No  . Drug use: No  . Sexual activity: Not on file   Other  Topics Concern  . Not on file   Social History Narrative  . No narrative on file    Family History  Problem Relation Age of Onset  . Heart failure Paternal Grandmother      Current Outpatient Prescriptions:  .  aspirin 81 MG tablet, Take 81 mg by mouth daily., Disp: , Rfl:  .  atorvastatin (LIPITOR) 10 MG tablet, Take 10 mg by mouth daily., Disp: , Rfl:  .  carvedilol (COREG) 25 MG tablet, Take 25 mg by mouth 2 (two) times daily with a meal., Disp: , Rfl:  .  Cyanocobalamin (VITAMIN B-12 PO), Take by mouth., Disp: , Rfl:  .  ferrous gluconate (FERGON) 324 MG tablet, Take 324 mg by mouth daily with breakfast., Disp: , Rfl:  .  furosemide (LASIX) 20 MG tablet, Take 20 mg by mouth., Disp: , Rfl:  .  gabapentin (NEURONTIN) 100 MG capsule, Take 100 mg by mouth as needed., Disp: , Rfl:  .  glimepiride (AMARYL) 4 MG tablet, Take 4 mg by mouth daily with breakfast., Disp: , Rfl:  .  hydrALAZINE (APRESOLINE) 25 MG tablet, Take 25 mg by mouth 3 (three) times daily., Disp: , Rfl:  .  isosorbide dinitrate (ISORDIL) 10 MG tablet, Take 10 mg by mouth 3 (three) times daily., Disp: , Rfl:  .  metFORMIN (GLUCOPHAGE) 1000 MG tablet, Take 1,000 mg by mouth 2 (two) times daily with a meal., Disp: , Rfl:  .  sacubitril-valsartan (ENTRESTO) 49-51 MG, Take 1 tablet by mouth 2 (two) times daily., Disp: , Rfl:  .  sitaGLIPtin (JANUVIA) 100 MG tablet, Take 100 mg by mouth daily., Disp: , Rfl:  .  vitamin C (ASCORBIC ACID) 500 MG tablet, Take 500 mg by mouth daily., Disp: , Rfl:   Physical exam:  Vitals:   10/22/16 0926  BP: (!) 186/83  Pulse: 80  Resp: 20  Temp: (!) 96.4 F (35.8 C)  TempSrc: Tympanic  Weight: 197 lb 5 oz (89.5 kg)   Physical Exam  Constitutional: He is oriented to person, place, and time and well-developed, well-nourished, and in no distress.  HENT:  Head: Normocephalic and atraumatic.  Eyes: EOM are normal. Pupils are equal, round, and reactive to light.  Neck: Normal range of  motion.  Cardiovascular: Normal rate, regular rhythm and normal heart sounds.   Pulmonary/Chest: Effort normal and breath sounds normal.  Abdominal: Soft. Bowel sounds are normal.  Neurological: He is alert and oriented to person, place, and time.  Skin: Skin is warm and dry.     CMP Latest Ref Rng & Units 10/04/2016  Glucose 65 - 99 mg/dL 231(H)  BUN 6 - 20 mg/dL 22(H)  Creatinine 0.61 - 1.24 mg/dL 1.15  Sodium 135 - 145 mmol/L 135  Potassium 3.5 - 5.1 mmol/L 4.3  Chloride 101 - 111 mmol/L 103  CO2 22 - 32 mmol/L 27  Calcium 8.9 - 10.3 mg/dL 9.2  Total Protein 6.5 - 8.1 g/dL 8.0  Total Bilirubin 0.3 - 1.2 mg/dL 0.6  Alkaline Phos 38 - 126 U/L  97  AST 15 - 41 U/L 19  ALT 17 - 63 U/L 15(L)   CBC Latest Ref Rng & Units 10/14/2016  WBC 3.8 - 10.6 K/uL 6.2  Hemoglobin 13.0 - 18.0 g/dL 9.9(L)  Hematocrit 40.0 - 52.0 % 30.6(L)  Platelets 150 - 440 K/uL 200    No images are attached to the encounter.  Dg Bone Survey Met  Result Date: 10/04/2016 CLINICAL DATA:  IgG monoclonal gammopathy. EXAM: METASTATIC BONE SURVEY COMPARISON:  None. FINDINGS: No definite evidence of lytic destruction or lesions seen in the skeleton, including the skull, spine, ribs, pelvis and extremities. IMPRESSION: No definite evidence of lytic destruction or lesions seen in the skeleton. Electronically Signed   By: Marijo Conception, M.D.   On: 10/04/2016 12:55   Ct Bone Marrow Biopsy & Aspiration  Result Date: 10/14/2016 CLINICAL DATA:  Multiple myeloma EXAM: CT GUIDED DEEP ILIAC BONE ASPIRATION AND CORE BIOPSY TECHNIQUE: The procedure, risks (including but not limited to bleeding, infection, organ damage ), benefits, and alternatives were explained to the patient. Questions regarding the procedure were encouraged and answered. The patient understands and consents to the procedure. Patient was placed supine on the CT gantry and limited axial scans through the pelvis were obtained. Appropriate skin entry site was  identified. Skin site was marked, prepped with chlorhexidine, draped in usual sterile fashion, and infiltrated locally with 1% lidocaine. Intravenous Fentanyl and Versed were administered as conscious sedation during continuous monitoring of the patient's level of consciousness and physiological / cardiorespiratory status by the radiology RN, with a total moderate sedation time of less than 30 minutes. Under CT fluoroscopic guidance an 11-gauge Cook trocar bone needle was advanced into the left iliac bone just lateral to the sacroiliac joint. Once needle tip position was confirmed, core x2 and aspiration samples were obtained. Post procedure scans show no hematoma or fracture. Patient tolerated procedure well. COMPLICATIONS: COMPLICATIONS none IMPRESSION: 1. Technically successful CT guided left iliac bone core and aspiration biopsy. Electronically Signed   By: Lucrezia Europe M.D.   On: 10/14/2016 10:24     Assessment and plan- Patient is a 71 y.o. male with newly diagnosed IgG kappa smoldering multiple myeloma   1. IgG kappa monoclonal protein- I discussed the results of the blood work with the patient which is consistent with smoldering multiple myeloma given that he does not yet meet IMWG criteria for multiple myeloma. He does not have any evidence of end organ damage including renal failure, hypercalcemia or bone lesions. He does have chronic microcytic anemia but I do not think that this is secondary to underlying myeloma given the reasons below. I will proceed with a PET CT scan to see if he has any bony lesions which was not seen on skeletal survey as that will upstage him to overt multiple myeloma.  2. Microcytic anemia- He does have evidence of chronic microcytic anemia- which is likely multifactorial secondary to iron deficiency anemia as well as pre-existing beta thalassemia minor. I will have him start taking oral iron 325 mg twice a day and recheck his iron studies in 3 months time.  I will see  him back in 2 weeks' time to discuss the results of blood work and hopefully we will have the [place] studies back fom his bone marrow as well at that time. If this is smoldering multiple myeloma, he does have 2 out of 3 high risk factors as per [facility] guidelines given that his free light chain ratio was  more than a as well as plasma cells in his bone marrow greater than 10% and the risk of progression to overt multiple myeloma at 5 years for him would be close to the 51%.    Total face to face encounter time for this patient visit was 30 min. >50% of the time was  spent in counseling and coordination of care.     Visit Diagnosis 1. Smoldering myeloma ([place])      Dr. Randa Evens, MD, MPH [facility] at [facility] Pager- [phone] 10/22/2016 11:56 AM

## 2016-10-22 ENCOUNTER — Inpatient Hospital Stay (HOSPITAL_BASED_OUTPATIENT_CLINIC_OR_DEPARTMENT_OTHER): Payer: Medicare Other | Admitting: Oncology

## 2016-10-22 VITALS — BP 186/83 | HR 80 | Temp 96.4°F | Resp 20 | Wt 197.3 lb

## 2016-10-22 DIAGNOSIS — E878 Other disorders of electrolyte and fluid balance, not elsewhere classified: Secondary | ICD-10-CM

## 2016-10-22 DIAGNOSIS — I70219 Atherosclerosis of native arteries of extremities with intermittent claudication, unspecified extremity: Secondary | ICD-10-CM

## 2016-10-22 DIAGNOSIS — I509 Heart failure, unspecified: Secondary | ICD-10-CM

## 2016-10-22 DIAGNOSIS — C9000 Multiple myeloma not having achieved remission: Secondary | ICD-10-CM

## 2016-10-22 DIAGNOSIS — Z87891 Personal history of nicotine dependence: Secondary | ICD-10-CM

## 2016-10-22 DIAGNOSIS — D563 Thalassemia minor: Secondary | ICD-10-CM | POA: Diagnosis not present

## 2016-10-22 DIAGNOSIS — E119 Type 2 diabetes mellitus without complications: Secondary | ICD-10-CM

## 2016-10-22 DIAGNOSIS — G629 Polyneuropathy, unspecified: Secondary | ICD-10-CM

## 2016-10-22 DIAGNOSIS — Z79899 Other long term (current) drug therapy: Secondary | ICD-10-CM

## 2016-10-22 DIAGNOSIS — E785 Hyperlipidemia, unspecified: Secondary | ICD-10-CM

## 2016-10-22 DIAGNOSIS — I10 Essential (primary) hypertension: Secondary | ICD-10-CM | POA: Diagnosis not present

## 2016-10-22 DIAGNOSIS — D509 Iron deficiency anemia, unspecified: Secondary | ICD-10-CM

## 2016-10-22 DIAGNOSIS — Z7984 Long term (current) use of oral hypoglycemic drugs: Secondary | ICD-10-CM

## 2016-10-22 DIAGNOSIS — I2510 Atherosclerotic heart disease of native coronary artery without angina pectoris: Secondary | ICD-10-CM

## 2016-10-22 DIAGNOSIS — D472 Monoclonal gammopathy: Secondary | ICD-10-CM

## 2016-10-22 DIAGNOSIS — Z7982 Long term (current) use of aspirin: Secondary | ICD-10-CM

## 2016-10-22 NOTE — Progress Notes (Signed)
No complaints at this time.

## 2016-10-23 ENCOUNTER — Telehealth: Payer: Self-pay | Admitting: *Deleted

## 2016-10-23 NOTE — Telephone Encounter (Signed)
-----   Message from [place] sent at 10/23/2016  9:26 AM EST ----- Pt is concerned on how to take meds before PET can you please call him to explain I got call by mistake.

## 2016-10-23 NOTE — Telephone Encounter (Signed)
Pt called with question about what to do about diabetic meds and eating for the morning of scan.  I called pt and he usually takes sugar level, takes diabetic pill and eats each morning. His scan was moved to 9 am arrival and for him to be NPO for 4hours he would not be able to eat. What to do.  I spoke with nuclear med. They rec: that pt have late night snack at bedtime the night before and then he hold his med. That am and bring med with him to procedure and they will check his sugar and give him snack after a scan. I checked to see what his sugars are every morning and he said 110 to 150.  I also gave him the number to nuclear med in case he has additional questions.

## 2016-10-24 ENCOUNTER — Ambulatory Visit: Payer: Medicare Other

## 2016-10-24 LAB — CHROMOSOME ANALYSIS, BONE MARROW

## 2016-10-24 LAB — TISSUE HYBRIDIZATION (BONE MARROW)-NCBH

## 2016-10-28 ENCOUNTER — Other Ambulatory Visit: Payer: Self-pay | Admitting: *Deleted

## 2016-10-28 ENCOUNTER — Ambulatory Visit
Admission: RE | Admit: 2016-10-28 | Discharge: 2016-10-28 | Disposition: A | Discharge status: Home / Self Care | Payer: Medicare Other | Source: Ambulatory Visit | Attending: Oncology | Admitting: Oncology

## 2016-10-28 DIAGNOSIS — K229 Disease of esophagus, unspecified: Secondary | ICD-10-CM | POA: Insufficient documentation

## 2016-10-28 DIAGNOSIS — C9000 Multiple myeloma not having achieved remission: Secondary | ICD-10-CM | POA: Diagnosis present

## 2016-10-28 DIAGNOSIS — D472 Monoclonal gammopathy: Secondary | ICD-10-CM

## 2016-10-28 LAB — GLUCOSE, CAPILLARY: GLUCOSE-CAPILLARY: 122 mg/dL — AB (ref 65–99)

## 2016-10-28 MED ORDER — FLUDEOXYGLUCOSE F - 18 (FDG) INJECTION
12.0000 | Freq: Once | INTRAVENOUS | Status: AC | PRN
  Administered 2016-10-28: 12.93 via INTRAVENOUS

## 2016-10-29 ENCOUNTER — Encounter (HOSPITAL_COMMUNITY): Payer: Self-pay

## 2016-11-06 ENCOUNTER — Telehealth: Payer: Self-pay | Admitting: *Deleted

## 2016-11-06 NOTE — Telephone Encounter (Signed)
Asking for PET scan results, please advise. He has no follow up scheduled

## 2016-11-07 ENCOUNTER — Other Ambulatory Visit: Payer: Self-pay | Admitting: *Deleted

## 2016-11-07 ENCOUNTER — Telehealth: Payer: Self-pay | Admitting: *Deleted

## 2016-11-07 DIAGNOSIS — R948 Abnormal results of function studies of other organs and systems: Secondary | ICD-10-CM

## 2016-11-07 DIAGNOSIS — C9000 Multiple myeloma not having achieved remission: Secondary | ICD-10-CM

## 2016-11-07 DIAGNOSIS — D472 Monoclonal gammopathy: Secondary | ICD-10-CM

## 2016-11-07 NOTE — Telephone Encounter (Signed)
I have called patient today

## 2016-11-07 NOTE — Telephone Encounter (Signed)
I discussed results of PET/CT scan with the patient. Patient does not have any bone lesions noted on PET/CT and hence he does not have overt multiple myeloma at this point. He does have smoldering multiple myeloma which needs to be monitored closely with blood work every 3 months.  Patient was also noted to have hilar and precarinal adenopathy. I discussed these findings at our tumor board and the consensus was to get a repeat CT scan in 3 months time as we do not have prior CT thorax for comparison.  All this was communicated to the patient and he agrees to have a CT scan and blood work in 3 months time and see me after that  Dr. Randa Evens, MD, MPH [place] at [facility] Pager- [phone] 11/07/2016 9:15 AM

## 2016-11-07 NOTE — Telephone Encounter (Signed)
Called the pt and let him know that after his phone call from Dr. Janese Banks she came and told me to do ct scan in 3 months and labs at same time and then see Dr. Janese Banks a week later to get all results and he said he is agreeable. I have entered the orders for labs and scan and sent message to scheduling to make appts and call pt.

## 2016-12-03 ENCOUNTER — Other Ambulatory Visit: Payer: Medicare Other

## 2016-12-06 ENCOUNTER — Encounter: Payer: Self-pay | Admitting: Oncology

## 2017-02-03 ENCOUNTER — Ambulatory Visit: Payer: Medicare Other

## 2017-02-03 ENCOUNTER — Inpatient Hospital Stay: Payer: Medicare Other

## 2017-02-07 ENCOUNTER — Inpatient Hospital Stay: Payer: Medicare Other | Admitting: Oncology

## 2017-02-19 ENCOUNTER — Ambulatory Visit
Admission: RE | Admit: 2017-02-19 | Discharge: 2017-02-19 | Disposition: A | Discharge status: Home / Self Care | Payer: Medicare Other | Source: Ambulatory Visit | Attending: Oncology | Admitting: Oncology

## 2017-02-19 ENCOUNTER — Inpatient Hospital Stay: Payer: Medicare Other | Attending: Oncology

## 2017-02-19 ENCOUNTER — Telehealth: Payer: Self-pay | Admitting: *Deleted

## 2017-02-19 DIAGNOSIS — D563 Thalassemia minor: Secondary | ICD-10-CM | POA: Diagnosis not present

## 2017-02-19 DIAGNOSIS — D509 Iron deficiency anemia, unspecified: Secondary | ICD-10-CM | POA: Diagnosis not present

## 2017-02-19 DIAGNOSIS — Z7982 Long term (current) use of aspirin: Secondary | ICD-10-CM | POA: Insufficient documentation

## 2017-02-19 DIAGNOSIS — Z950 Presence of cardiac pacemaker: Secondary | ICD-10-CM | POA: Insufficient documentation

## 2017-02-19 DIAGNOSIS — D472 Monoclonal gammopathy: Secondary | ICD-10-CM

## 2017-02-19 DIAGNOSIS — I10 Essential (primary) hypertension: Secondary | ICD-10-CM | POA: Insufficient documentation

## 2017-02-19 DIAGNOSIS — I700 Atherosclerosis of aorta: Secondary | ICD-10-CM | POA: Diagnosis not present

## 2017-02-19 DIAGNOSIS — R948 Abnormal results of function studies of other organs and systems: Secondary | ICD-10-CM | POA: Insufficient documentation

## 2017-02-19 DIAGNOSIS — Z87891 Personal history of nicotine dependence: Secondary | ICD-10-CM | POA: Diagnosis not present

## 2017-02-19 DIAGNOSIS — Z79899 Other long term (current) drug therapy: Secondary | ICD-10-CM | POA: Insufficient documentation

## 2017-02-19 DIAGNOSIS — R590 Localized enlarged lymph nodes: Secondary | ICD-10-CM | POA: Diagnosis not present

## 2017-02-19 DIAGNOSIS — I2510 Atherosclerotic heart disease of native coronary artery without angina pectoris: Secondary | ICD-10-CM | POA: Diagnosis not present

## 2017-02-19 DIAGNOSIS — G629 Polyneuropathy, unspecified: Secondary | ICD-10-CM | POA: Diagnosis not present

## 2017-02-19 DIAGNOSIS — Z7984 Long term (current) use of oral hypoglycemic drugs: Secondary | ICD-10-CM | POA: Insufficient documentation

## 2017-02-19 DIAGNOSIS — N2889 Other specified disorders of kidney and ureter: Secondary | ICD-10-CM | POA: Insufficient documentation

## 2017-02-19 DIAGNOSIS — E119 Type 2 diabetes mellitus without complications: Secondary | ICD-10-CM | POA: Diagnosis not present

## 2017-02-19 DIAGNOSIS — I509 Heart failure, unspecified: Secondary | ICD-10-CM | POA: Diagnosis not present

## 2017-02-19 DIAGNOSIS — C9000 Multiple myeloma not having achieved remission: Secondary | ICD-10-CM

## 2017-02-19 DIAGNOSIS — E878 Other disorders of electrolyte and fluid balance, not elsewhere classified: Secondary | ICD-10-CM | POA: Insufficient documentation

## 2017-02-19 HISTORY — DX: Essential (primary) hypertension: I10

## 2017-02-19 HISTORY — DX: Heart failure, unspecified: I50.9

## 2017-02-19 LAB — COMPREHENSIVE METABOLIC PANEL
ALK PHOS: 96 U/L (ref 38–126)
ALT: 10 U/L — ABNORMAL LOW (ref 17–63)
ANION GAP: 4 — AB (ref 5–15)
AST: 18 U/L (ref 15–41)
Albumin: 3.3 g/dL — ABNORMAL LOW (ref 3.5–5.0)
BILIRUBIN TOTAL: 0.5 mg/dL (ref 0.3–1.2)
BUN: 19 mg/dL (ref 6–20)
CALCIUM: 9.1 mg/dL (ref 8.9–10.3)
CO2: 27 mmol/L (ref 22–32)
Chloride: 105 mmol/L (ref 101–111)
Creatinine, Ser: 1.3 mg/dL — ABNORMAL HIGH (ref 0.61–1.24)
GFR, EST NON AFRICAN AMERICAN: 53 mL/min — AB (ref >60)
Glucose, Bld: 199 mg/dL — ABNORMAL HIGH (ref 65–99)
Potassium: 4.5 mmol/L (ref 3.5–5.1)
Sodium: 136 mmol/L (ref 135–145)
TOTAL PROTEIN: 7.7 g/dL (ref 6.5–8.1)

## 2017-02-19 LAB — CBC
HEMATOCRIT: 30.3 % — AB (ref 40.0–52.0)
HEMOGLOBIN: 9.7 g/dL — AB (ref 13.0–18.0)
MCH: 21.7 pg — AB (ref 26.0–34.0)
MCHC: 32.2 g/dL (ref 32.0–36.0)
MCV: 67.5 fL — AB (ref 80.0–100.0)
Platelets: 200 10*3/uL (ref 150–440)
RBC: 4.48 MIL/uL (ref 4.40–5.90)
RDW: 17.4 % — ABNORMAL HIGH (ref 11.5–14.5)
WBC: 6.1 10*3/uL (ref 3.8–10.6)

## 2017-02-19 LAB — IRON AND TIBC
IRON: 24 ug/dL — AB (ref 45–182)
Saturation Ratios: 11 % — ABNORMAL LOW (ref 17.9–39.5)
TIBC: 216 ug/dL — AB (ref 250–450)
UIBC: 192 ug/dL

## 2017-02-19 LAB — FERRITIN: Ferritin: 16 ng/mL — ABNORMAL LOW (ref 24–336)

## 2017-02-19 MED ORDER — IOPAMIDOL (ISOVUE-300) INJECTION 61%
75.0000 mL | Freq: Once | INTRAVENOUS | Status: AC | PRN
  Administered 2017-02-19: 75 mL via INTRAVENOUS

## 2017-02-19 NOTE — Telephone Encounter (Signed)
Called report results, I showed it to Dr Rogue Bussing and he said this could wait :   IMPRESSION: 1. There appears to be a mass associated with the right kidney, only partially imaged. This region was not as well evaluated on a previous PET-CT as above. However, I am concerned for a possible solid renal mass which could represent a renal cell carcinoma. The patient has a pacemaker and is likely not eligible for an MRI. Recommend a CT scan with a renal mass protocol for better assessment. 2. Mild adenopathy in the mediastinum and hila on the previous PET-CT which was mildly FDG avid is stable on CT images today. These nodes are not specific, especially given the possibility of a right renal mass. 3. Thickening of the distal esophagus is favored to represent esophagitis. 4. A few lucent lesions are seen in the upper thoracic spine as above. In retrospect, they are probably unchanged since the PET-CT. They demonstrated no abnormal FDG uptake on the PET-CT. Recommend attention on follow-up. 5. Atherosclerosis in the aorta. These results will be called to the ordering clinician or representative by the Radiologist Assistant, and communication documented in the PACS or zVision Dashboard.  Aortic Atherosclerosis (ICD10-I70.0).   Electronically Signed   By: Dorise Bullion III M.D   On: 02/19/2017 12:17

## 2017-02-20 LAB — KAPPA/LAMBDA LIGHT CHAINS
KAPPA FREE LGHT CHN: 271.3 mg/L — AB (ref 3.3–19.4)
KAPPA, LAMDA LIGHT CHAIN RATIO: 18.71 — AB (ref 0.26–1.65)
Lambda free light chains: 14.5 mg/L (ref 5.7–26.3)

## 2017-02-21 ENCOUNTER — Inpatient Hospital Stay (HOSPITAL_BASED_OUTPATIENT_CLINIC_OR_DEPARTMENT_OTHER): Payer: Medicare Other | Admitting: Oncology

## 2017-02-21 VITALS — BP 162/79 | HR 75 | Temp 97.2°F | Resp 18 | Wt 199.4 lb

## 2017-02-21 DIAGNOSIS — C9000 Multiple myeloma not having achieved remission: Secondary | ICD-10-CM

## 2017-02-21 DIAGNOSIS — I509 Heart failure, unspecified: Secondary | ICD-10-CM

## 2017-02-21 DIAGNOSIS — E119 Type 2 diabetes mellitus without complications: Secondary | ICD-10-CM

## 2017-02-21 DIAGNOSIS — E878 Other disorders of electrolyte and fluid balance, not elsewhere classified: Secondary | ICD-10-CM | POA: Diagnosis not present

## 2017-02-21 DIAGNOSIS — D509 Iron deficiency anemia, unspecified: Secondary | ICD-10-CM | POA: Diagnosis not present

## 2017-02-21 DIAGNOSIS — G629 Polyneuropathy, unspecified: Secondary | ICD-10-CM

## 2017-02-21 DIAGNOSIS — Z7982 Long term (current) use of aspirin: Secondary | ICD-10-CM

## 2017-02-21 DIAGNOSIS — Z87891 Personal history of nicotine dependence: Secondary | ICD-10-CM

## 2017-02-21 DIAGNOSIS — I10 Essential (primary) hypertension: Secondary | ICD-10-CM | POA: Diagnosis not present

## 2017-02-21 DIAGNOSIS — N2889 Other specified disorders of kidney and ureter: Secondary | ICD-10-CM

## 2017-02-21 DIAGNOSIS — I70213 Atherosclerosis of native arteries of extremities with intermittent claudication, bilateral legs: Secondary | ICD-10-CM | POA: Insufficient documentation

## 2017-02-21 DIAGNOSIS — I700 Atherosclerosis of aorta: Secondary | ICD-10-CM

## 2017-02-21 DIAGNOSIS — D563 Thalassemia minor: Secondary | ICD-10-CM

## 2017-02-21 DIAGNOSIS — Z79899 Other long term (current) drug therapy: Secondary | ICD-10-CM

## 2017-02-21 DIAGNOSIS — Z7984 Long term (current) use of oral hypoglycemic drugs: Secondary | ICD-10-CM

## 2017-02-21 DIAGNOSIS — R590 Localized enlarged lymph nodes: Secondary | ICD-10-CM | POA: Diagnosis not present

## 2017-02-21 LAB — MULTIPLE MYELOMA PANEL, SERUM
ALBUMIN/GLOB SERPL: 0.9 (ref 0.7–1.7)
ALPHA2 GLOB SERPL ELPH-MCNC: 0.7 g/dL (ref 0.4–1.0)
Albumin SerPl Elph-Mcnc: 3.4 g/dL (ref 2.9–4.4)
Alpha 1: 0.2 g/dL (ref 0.0–0.4)
B-GLOBULIN SERPL ELPH-MCNC: 0.7 g/dL (ref 0.7–1.3)
GLOBULIN, TOTAL: 4 g/dL — AB (ref 2.2–3.9)
Gamma Glob SerPl Elph-Mcnc: 2.4 g/dL — ABNORMAL HIGH (ref 0.4–1.8)
IGG (IMMUNOGLOBIN G), SERUM: 2797 mg/dL — AB (ref 700–1600)
IGM, SERUM: 18 mg/dL (ref 15–143)
IgA: 26 mg/dL — ABNORMAL LOW (ref 61–437)
M Protein SerPl Elph-Mcnc: 2.2 g/dL — ABNORMAL HIGH
Total Protein ELP: 7.4 g/dL (ref 6.0–8.5)

## 2017-02-21 NOTE — Progress Notes (Signed)
Patient offers no concerns today. 

## 2017-02-22 ENCOUNTER — Other Ambulatory Visit: Payer: Self-pay | Admitting: Oncology

## 2017-02-22 ENCOUNTER — Encounter: Payer: Self-pay | Admitting: Oncology

## 2017-02-22 DIAGNOSIS — D509 Iron deficiency anemia, unspecified: Secondary | ICD-10-CM

## 2017-02-22 NOTE — Progress Notes (Signed)
Hematology/Oncology Consult note [facility]  [phone] [phone]  Patient Care Team: [name], MD as PCP - General (Pediatrics)   Name of the patient: Adam Wall  [id number]  December 14, 1945   Date of visit: 02/22/17  Diagnosis- 1. Smoldering multiple myeloma 2. Right renal mass 3. Mediastinal adenopathy of uncertain etiology 4. Iron deficiency anemia  Chief complaint/ Reason for visit- discuss results of CT thorax  Heme/Onc history:  patient is a 71 year old male with a past medical history significant for congestive heart failure with an ICD in place, hypertension and diabetes among other comorbidities. Recent SPEP on 09/19/2016 revealed a monoclonal M spike of 1.9 g of IgG kappa. Recent CBC showed white count of 5.5, H&H of 10.1/31.9 with an MCV of 69 of note patient has always been anemic with a hemoglobin between 9.6-10.7 over the last 2 years. Also his MCV has always been between 68-70 inthe past. Iron studies from 06/13/2016 revealed ferritin of 18 and a low TIBC of 224. 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 CMP has always revealed normal kidney function and normal calcium level.  Results of blood work from 10/04/2016 breast follows: CBC showed white count of 4.7, H&H of 10/31.4 with an MCV of 67.8 and a platelet count of 195. CMP was within normal limits with albumin of 3.3 and a calcium of 9.2. Beta 2 microglobulin was mildly elevated at 3. and LDH was mildly low at 90. Ferritin was low at 22 and iron studies showed a low serum iron of 39 and a TIBC low at 230. 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 B12 level was normal at 331.  Quantitative immunoglobulins revealed an elevated IgG of 2875 and IgG kappa monoclonal M spike of 2.3 g. kappa lambda free light chain ratio was abnormal at 14.45  Bone marrow biopsy showed hypercellular bone marrow with 36% atypical plasma cells. Cytogenetics and FISH  studies are currently pending  Skeletal survey showed no definitive evidence of lytic destruction or lesions in the skeleton.  24 hour urine protein showed 610 mg and M spike of 66 mg   Interval history- doing well. Denies any complaints  ECOG PS- 0 Pain scale- 0   Review of systems- Review of Systems  Constitutional: Negative for chills, fever, malaise/fatigue and weight loss.  HENT: Negative for congestion, ear discharge and nosebleeds.   Eyes: Negative for blurred vision.  Respiratory: Negative for cough, hemoptysis, sputum production, shortness of breath and wheezing.   Cardiovascular: Negative for chest pain, palpitations, orthopnea and claudication.  Gastrointestinal: Negative for abdominal pain, blood in stool, constipation, diarrhea, heartburn, melena, nausea and vomiting.  Genitourinary: Negative for dysuria, flank pain, frequency, hematuria and urgency.  Musculoskeletal: Negative for back pain, joint pain and myalgias.  Skin: Negative for rash.  Neurological: Negative for dizziness, tingling, focal weakness, seizures, weakness and headaches.  Endo/Heme/Allergies: Does not bruise/bleed easily.  Psychiatric/Behavioral: Negative for depression and suicidal ideas. The patient does not have insomnia.       No Known Allergies   Past Medical History:  Diagnosis Date  . Anemia   . Blockage of coronary artery of heart ([place])   . CHF (congestive heart failure) ([place])   . Diabetes mellitus without complication ([place])   . Hypertension   . Hypochloremia   . Neuropathy      Past Surgical History:  Procedure Laterality Date  . CARDIAC SURGERY    . Heart Bypass    . PACEMAKER  PLACEMENT      Social History   Social History  . Marital status: Married    Spouse name: N/A  . Number of children: N/A  . Years of education: N/A   Occupational History  . Not on file.   Social History Main Topics  . Smoking status: Former Smoker    Quit date: 10/04/1993  . Smokeless  tobacco: Never Used  . Alcohol use No  . Drug use: No  . Sexual activity: Not on file   Other Topics Concern  . Not on file   Social History Narrative  . No narrative on file    Family History  Problem Relation Age of Onset  . Heart failure Paternal Grandmother      Current Outpatient Prescriptions:  .  aspirin 81 MG tablet, Take 81 mg by mouth daily., Disp: , Rfl:  .  atorvastatin (LIPITOR) 10 MG tablet, Take 10 mg by mouth daily., Disp: , Rfl:  .  carvedilol (COREG) 25 MG tablet, Take 25 mg by mouth 2 (two) times daily with a meal., Disp: , Rfl:  .  Cyanocobalamin (VITAMIN B-12 PO), Take by mouth., Disp: , Rfl:  .  ferrous gluconate (FERGON) 324 MG tablet, Take 324 mg by mouth daily with breakfast., Disp: , Rfl:  .  furosemide (LASIX) 20 MG tablet, Take 20 mg by mouth., Disp: , Rfl:  .  gabapentin (NEURONTIN) 100 MG capsule, Take 100 mg by mouth as needed., Disp: , Rfl:  .  glimepiride (AMARYL) 4 MG tablet, Take 4 mg by mouth daily with breakfast., Disp: , Rfl:  .  hydrALAZINE (APRESOLINE) 25 MG tablet, Take 25 mg by mouth 3 (three) times daily., Disp: , Rfl:  .  isosorbide dinitrate (ISORDIL) 10 MG tablet, Take 10 mg by mouth 3 (three) times daily., Disp: , Rfl:  .  metFORMIN (GLUCOPHAGE) 1000 MG tablet, Take 1,000 mg by mouth 2 (two) times daily with a meal., Disp: , Rfl:  .  sacubitril-valsartan (ENTRESTO) 49-51 MG, Take 1 tablet by mouth 2 (two) times daily., Disp: , Rfl:  .  sitaGLIPtin (JANUVIA) 100 MG tablet, Take 100 mg by mouth daily., Disp: , Rfl:  .  vitamin C (ASCORBIC ACID) 500 MG tablet, Take 500 mg by mouth daily., Disp: , Rfl:   Physical exam:  Vitals:   02/21/17 1506  BP: (!) 162/79  Pulse: 75  Resp: 18  Temp: 97.2 F (36.2 C)  TempSrc: Tympanic  Weight: 199 lb 7 oz (90.5 kg)   Physical Exam  Constitutional: He is oriented to person, place, and time and well-developed, well-nourished, and in no distress.  HENT:  Head: Normocephalic and atraumatic.    Eyes: EOM are normal. Pupils are equal, round, and reactive to light.  Neck: Normal range of motion.  Cardiovascular: Normal rate, regular rhythm and normal heart sounds.   Pulmonary/Chest: Effort normal and breath sounds normal.  Abdominal: Soft. Bowel sounds are normal.  Neurological: He is alert and oriented to person, place, and time.  Skin: Skin is warm and dry.     CMP Latest Ref Rng & Units 02/19/2017  Glucose 65 - 99 mg/dL 199(H)  BUN 6 - 20 mg/dL 19  Creatinine 0.61 - 1.24 mg/dL 1.30(H)  Sodium 135 - 145 mmol/L 136  Potassium 3.5 - 5.1 mmol/L 4.5  Chloride 101 - 111 mmol/L 105  CO2 22 - 32 mmol/L 27  Calcium 8.9 - 10.3 mg/dL 9.1  Total Protein 6.5 - 8.1 g/dL 7.7  Total Bilirubin 0.3 - 1.2 mg/dL 0.5  Alkaline Phos 38 - 126 U/L 96  AST 15 - 41 U/L 18  ALT 17 - 63 U/L 10(L)   CBC Latest Ref Rng & Units 02/19/2017  WBC 3.8 - 10.6 K/uL 6.1  Hemoglobin 13.0 - 18.0 g/dL 9.7(L)  Hematocrit 40.0 - 52.0 % 30.3(L)  Platelets 150 - 440 K/uL 200    No images are attached to the encounter.  Ct Chest W Contrast  Result Date: 02/19/2017 CLINICAL DATA:  Multiple myeloma.  Follow-up PET-CT. EXAM: CT CHEST WITH CONTRAST TECHNIQUE: Multidetector CT imaging of the chest was performed during intravenous contrast administration. CONTRAST:  38m ISOVUE-300 IOPAMIDOL (ISOVUE-300) INJECTION 61% COMPARISON:  PET-CT October 28, 2016 FINDINGS: Cardiovascular: Atherosclerotic changes are seen in the non aneurysmal aorta. Coronary artery calcifications are noted. The central pulmonary artery is normal. Mediastinum/Nodes: The thyroid is normal in appearance. Mild thickening of the distal esophagus may represent esophagitis as described on the previous PET-CT. A mildly prominent node posterior to the distal esophagus measures 9 mm, likely reactive. This is unchanged. The representative precarinal node measures 2.3 by 1.1 cm today versus 2.3 by 1.8 cm previously. The 1.8 cm dimension previously was likely an  overestimation as the node was harder to see on that study. I do not believe there has really been a true decrease in size. Other mediastinal nodes are stable. Some prominent nodes in the right hilum are difficult to compare given the lack of contrast on the PET-CT but there is calcification within these nodes suggesting sequela of previous granulomatous disease. No definitive new adenopathy. Lungs/Pleura: The central airways are normal. No pneumothorax. A few scattered calcified pulmonary nodules are identified consistent with previous granulomatous disease. No suspicious pulmonary nodules. No masses or infiltrates. Upper Abdomen: There appears to be an enhancing mass off the lateral right kidney, only partially visualized, measuring up to 3.6 cm. This region was not as well assessed on the previous PET-CT due to the lack of contrast on CT images and the fact that renal masses are often difficult to assess with PET imaging given the lack of significant uptake within many renal masses. However, in retrospect, there is likely a contour abnormality in this region which demonstrated an attenuation of 23 Hounsfield units. A low-attenuation lesion in the spleen is of doubtful significance and stable. Adrenal thickening on the left is similar in the interval, likely hyperplasia. There was no discrete abnormal uptake in the left adrenal gland on the previous PET-CT. No other abnormalities are seen in the upper abdomen. Musculoskeletal: There is a lucent lesion along the anterior inferior aspect of T2 which is unchanged since the previous PET-CT. There was no increased FDG uptake within this lesion. Mildly heterogeneous appearance to the T3 vertebral body, also without abnormal FDG uptake on the previous study. Anterior wedging of L1 is identified. IMPRESSION: 1. There appears to be a mass associated with the right kidney, only partially imaged. This region was not as well evaluated on a previous PET-CT as above. However, I  am concerned for a possible solid renal mass which could represent a renal cell carcinoma. The patient has a pacemaker and is likely not eligible for an MRI. Recommend a CT scan with a renal mass protocol for better assessment. 2. Mild adenopathy in the mediastinum and hila on the previous PET-CT which was mildly FDG avid is stable on CT images today. These nodes are not specific, especially given the possibility of a right renal mass.  3. Thickening of the distal esophagus is favored to represent esophagitis. 4. A few lucent lesions are seen in the upper thoracic spine as above. In retrospect, they are probably unchanged since the PET-CT. They demonstrated no abnormal FDG uptake on the PET-CT. Recommend attention on follow-up. 5. Atherosclerosis in the aorta. These results will be called to the ordering clinician or representative by the Radiologist Assistant, and communication documented in the PACS or zVision Dashboard. Aortic Atherosclerosis (ICD10-I70.0). Electronically Signed   By: Dorise Bullion III M.D   On: 02/19/2017 12:17     Assessment and plan- Patient is a 71 y.o. male who sees me for following issues:  1. Smoldering multiple myeloma- reviewed repeat myeloma labs which are stable continue close monitoring and repeat cbc, cmp, myeloma panel and free light chains in 3 months  2. microcytic anemia- combination of iron deficiency and thalassemia trait. Oral iron has not improved his anemia. Will plan to give 2 doses of feraheme weekly. Discussed risks and benefits of IV iron including all but not limited to headache, leg swelling, risk of infuison reactions. Patient understands and agrees to proceed. He is due for repeat colonoscopy this year but would would like to defer this discussion for 3 months down the line given ongoing issues  3. Mediastinal adenopathy- adenopathy persists 3 months later on CT as compared to PET/CT. Images were reviewed and discussed with pulmonary at tumor board  yesterday. These nodes are amenable to EBUS for definitive diagnosis. I will therefore refer him to pulmonary DR. Kasa for the same  4. Right kidney mass- this was less obvious in pet ct scan and is partially imaged on CT thorax. It warrants further work up. Patient has seen Dr. Bernardo Heater in the past and will get in touch with him. I will defer CT abdomen with renal mass protocol to be ordered by DR. Stoioff   Visit Diagnosis 1. Right renal mass   2. Multiple myeloma, remission status unspecified ([place])   3. Microcytic anemia   4. Iron deficiency anemia, unspecified iron deficiency anemia type   5. Mediastinal adenopathy      Dr. Randa Evens, MD, MPH [facility] at [facility] Pager- [phone] 02/22/2017 3:13 PM

## 2017-02-26 DIAGNOSIS — D472 Monoclonal gammopathy: Secondary | ICD-10-CM | POA: Insufficient documentation

## 2017-02-26 DIAGNOSIS — C9000 Multiple myeloma not having achieved remission: Secondary | ICD-10-CM | POA: Insufficient documentation

## 2017-02-28 ENCOUNTER — Inpatient Hospital Stay: Payer: Medicare Other | Attending: Oncology

## 2017-02-28 VITALS — BP 151/78 | HR 75 | Temp 98.9°F | Resp 18

## 2017-02-28 DIAGNOSIS — Z79899 Other long term (current) drug therapy: Secondary | ICD-10-CM | POA: Insufficient documentation

## 2017-02-28 DIAGNOSIS — D509 Iron deficiency anemia, unspecified: Secondary | ICD-10-CM | POA: Insufficient documentation

## 2017-02-28 MED ORDER — SODIUM CHLORIDE 0.9 % IV SOLN
510.0000 mg | Freq: Once | INTRAVENOUS | Status: AC
  Administered 2017-02-28: 510 mg via INTRAVENOUS
  Filled 2017-02-28: qty 17

## 2017-02-28 MED ORDER — SODIUM CHLORIDE 0.9 % IV SOLN
Freq: Once | INTRAVENOUS | Status: AC
  Administered 2017-02-28: 14:00:00 via INTRAVENOUS
  Filled 2017-02-28: qty 1000

## 2017-03-03 DIAGNOSIS — N2889 Other specified disorders of kidney and ureter: Secondary | ICD-10-CM | POA: Insufficient documentation

## 2017-03-07 ENCOUNTER — Inpatient Hospital Stay: Payer: Medicare Other

## 2017-03-07 VITALS — BP 184/84 | HR 71 | Temp 97.2°F | Resp 18

## 2017-03-07 DIAGNOSIS — D509 Iron deficiency anemia, unspecified: Secondary | ICD-10-CM

## 2017-03-07 MED ORDER — SODIUM CHLORIDE 0.9 % IV SOLN
Freq: Once | INTRAVENOUS | Status: AC
  Administered 2017-03-07: 13:00:00 via INTRAVENOUS
  Filled 2017-03-07: qty 1000

## 2017-03-07 MED ORDER — SODIUM CHLORIDE 0.9 % IV SOLN
510.0000 mg | Freq: Once | INTRAVENOUS | Status: AC
  Administered 2017-03-07: 510 mg via INTRAVENOUS
  Filled 2017-03-07: qty 17

## 2017-03-13 DIAGNOSIS — R0602 Shortness of breath: Secondary | ICD-10-CM | POA: Insufficient documentation

## 2017-03-14 ENCOUNTER — Other Ambulatory Visit: Payer: Self-pay | Admitting: *Deleted

## 2017-03-14 ENCOUNTER — Ambulatory Visit (INDEPENDENT_AMBULATORY_CARE_PROVIDER_SITE_OTHER): Payer: Medicare Other | Admitting: Pulmonary Disease

## 2017-03-14 ENCOUNTER — Encounter: Payer: Self-pay | Admitting: Pulmonary Disease

## 2017-03-14 VITALS — BP 100/78 | HR 85 | Resp 16 | Ht 72.0 in | Wt 197.0 lb

## 2017-03-14 DIAGNOSIS — R590 Localized enlarged lymph nodes: Secondary | ICD-10-CM | POA: Diagnosis not present

## 2017-03-14 NOTE — Patient Instructions (Addendum)
We will schedule an EBUS bronchoscopy with Dr. Mortimer Fries. Our office will call you with the timing of that and let you know when to hold the aspirin.  No follow-up has been scheduled for now. Dr. Mortimer Fries will decide whether further follow-up in this office as necessary

## 2017-03-16 NOTE — Progress Notes (Signed)
PULMONARY CONSULT NOTE  Requesting MD/Service: Janese Banks Date of initial consultation: 03/14/17 Reason for consultation:   PT PROFILE: 71 y.o. male referred for evaluation of mild mediastinal and hilar adenopathy  DATA: 10/28/16 PET scan: IMPRESSION: 1. FDG avid hilar and mediastinal nodes. These nodes are nonspecific but would represent an unusual metastatic pattern for multiple myeloma. These nodes could be reactive. Lymphoma or metastatic disease from a another unidentified source is not completely excluded on this study. Recommend clinical correlation and attention on follow-up. 2. Mild uptake in the left posterior T8 and T10 vertebral bodies with no CT correlate. These findings are not diagnostic for myelomatous involvement. Recommend attention on follow-up. 3. Uptake in the left posterior iliac bone is likely due to recent biopsy. 4. Uptake in the distal esophagus with mild esophageal wall thickening. Given the CT appearance and level of uptake, I suspect esophagitis rather than underlying neoplasm. This could be further worked up with direct visualization or upper GI as clinically warranted.  02/19/17 CT chest: IMPRESSION: 1. There appears to be a mass associated with the right kidney, only partially imaged. This region was not as well evaluated on a previous PET-CT as above. However, I am concerned for a possible solid renal mass which could represent a renal cell carcinoma. The patient has a pacemaker and is likely not eligible for an MRI. Recommend a CT scan with a renal mass protocol for better assessment. 2. Mild adenopathy in the mediastinum and hila on the previous PET-CT which was mildly FDG avid is stable on CT images today. These nodes are not specific, especially given the possibility of a right renal mass. 3. Thickening of the distal esophagus is favored to represent esophagitis. 4. A few lucent lesions are seen in the upper thoracic spine as above. In  retrospect, they are probably unchanged since the PET-CT. They demonstrated no abnormal FDG uptake on the PET-CT. Recommend attention on follow-up. 5. Atherosclerosis in the aorta. These results will be called to the ordering clinician or representative by the Radiologist Assistant, and communication documented in the PACS or zVision Dashboard.  HPI:  As above. The patient has been seen by heme onc for chronic anemia. A bone marrow biopsy was performed in February 2018 which revealed plasma cell neoplasm. A PET scan was performed as noted above. The CT chest was performed in evaluation of the adenopathy noted on PET scan. He is referred now for consideration of mediastinal lymph node biopsy. He presently has no significant restaurant symptoms. He has minimal smoking history (only smoked occasional cigars for a few years). He has no other respiratory symptoms. The plan is for resection of the apparent right kidney tumor assuming the mediastinal lymphadenopathy does not represent metastatic disease.  Past Medical History:  Diagnosis Date  . Anemia   . Blockage of coronary artery of heart ([place])   . CHF (congestive heart failure) ([place])   . Diabetes mellitus without complication ([place])   . Hypertension   . Hypochloremia   . Neuropathy     Past Surgical History:  Procedure Laterality Date  . CARDIAC SURGERY    . Heart Bypass    . PACEMAKER PLACEMENT      MEDICATIONS: I have reviewed all medications and confirmed regimen as documented  Social History   Social History  . Marital status: Married    Spouse name: N/A  . Number of children: N/A  . Years of education: N/A   Occupational History  . Not on file.   Social  History Main Topics  . Smoking status: Former Smoker    Quit date: 10/04/1993  . Smokeless tobacco: Never Used  . Alcohol use No  . Drug use: No  . Sexual activity: Not on file   Other Topics Concern  . Not on file   Social History Narrative  . No narrative on  file    Family History  Problem Relation Age of Onset  . Heart failure Paternal Grandmother     ROS: No fever, myalgias/arthralgias, unexplained weight loss or weight gain No new focal weakness or sensory deficits No otalgia, hearing loss, visual changes, nasal and sinus symptoms, mouth and throat problems No neck pain or adenopathy No abdominal pain, N/V/D, diarrhea, change in bowel pattern No dysuria, change in urinary pattern   Vitals:   03/14/17 1039  BP: 100/78  Pulse: 85  Resp: 16  SpO2: 97%  Weight: 197 lb (89.4 kg)  Height: 6' (1.829 m)     EXAM:  Gen: WDWN, No overt respiratory distress HEENT: NCAT, sclera white, oropharynx normal Neck: Supple without LAN, thyromegaly, JVD Lungs: breath sounds Full, percussion normal, no adventitious sounds  Cardiovascular: RRR, soft systolic murmur radiating towards neck Abdomen: Soft, nontender, normal BS Ext: without clubbing, cyanosis, edema Neuro: CNs grossly intact, motor and sensory intact Skin: Limited exam, no lesions noted  DATA:   BMP Latest Ref Rng & Units 02/19/2017 10/04/2016  Glucose 65 - 99 mg/dL 199(H) 231(H)  BUN 6 - 20 mg/dL 19 22(H)  Creatinine 0.61 - 1.24 mg/dL 1.30(H) 1.15  Sodium 135 - 145 mmol/L 136 135  Potassium 3.5 - 5.1 mmol/L 4.5 4.3  Chloride 101 - 111 mmol/L 105 103  CO2 22 - 32 mmol/L 27 27  Calcium 8.9 - 10.3 mg/dL 9.1 9.2    CBC Latest Ref Rng & Units 02/19/2017 10/14/2016 10/04/2016  WBC 3.8 - 10.6 K/uL 6.1 6.2 4.7  Hemoglobin 13.0 - 18.0 g/dL 9.7(L) 9.9(L) 10.0(L)  Hematocrit 40.0 - 52.0 % 30.3(L) 30.6(L) 31.4(L)  Platelets 150 - 440 K/uL 200 200 195    CXR:    IMPRESSION:   1) multiple myeloma 2) right kidney mass 3) mediastinal and hilar lymph adenopathy positive on PET scan. Concern for metastatic disease.  PLAN:  He was discussed previously at tumor board. The plan as outlined during that meeting was to proceed with EBUS and lymph node biopsy. I have discussed this procedure  with him in detail including its indication, risks and alternatives. Given his overall good cardiac and pulmonary status, he is low risk for major complications. He agrees to proceed. I have discussed this with Dr. Mortimer Fries who has also reviewed the CT chest. The procedure will be arranged by Dr. Mortimer Fries. Further follow-up in pulmonary office will be arranged as needed   Merton Border, MD PCCM service Mobile [phone] Pager [phone] 03/16/2017 2:27 PM

## 2017-03-26 ENCOUNTER — Encounter
Admission: RE | Admit: 2017-03-26 | Discharge: 2017-03-26 | Disposition: A | Discharge status: Home / Self Care | Payer: Medicare Other | Source: Ambulatory Visit | Attending: Internal Medicine | Admitting: Internal Medicine

## 2017-03-26 HISTORY — DX: Cardiac arrhythmia, unspecified: I49.9

## 2017-03-26 HISTORY — DX: Cardiac murmur, unspecified: R01.1

## 2017-03-26 HISTORY — DX: Presence of automatic (implantable) cardiac defibrillator: Z95.810

## 2017-03-26 HISTORY — DX: Other specified disorders of kidney and ureter: N28.89

## 2017-03-26 HISTORY — DX: Left bundle-branch block, unspecified: I44.7

## 2017-03-26 NOTE — Patient Instructions (Addendum)
  Your procedure is scheduled on: 04-03-17 THURSDAY Report to Same Day Surgery 2nd floor medical mall [facility] Entrance-take elevator on left to 2nd floor.  Check in with surgery information desk.) To find out your arrival time please call [phone] between 1PM - 3PM on 04-02-17 [facility]  Remember: Instructions that are not followed completely may result in serious medical risk, up to and including death, or upon the discretion of your surgeon and anesthesiologist your surgery may need to be rescheduled.    _x___ 1. Do not eat food or drink liquids after midnight. No gum chewing or hard candies.     __x__ 2. No Alcohol for 24 hours before or after surgery.   __x__3. No Smoking for 24 prior to surgery.   ____  4. Bring all medications with you on the day of surgery if instructed.    __x__ 5. Notify your doctor if there is any change in your medical condition     (cold, fever, infections).     Do not wear jewelry, make-up, hairpins, clips or nail polish.  Do not wear lotions, powders, or perfumes. You may wear deodorant.  Do not shave 48 hours prior to surgery. Men may shave face and neck.  Do not bring valuables to the hospital.    [facility] is not responsible for any belongings or valuables.               Contacts, dentures or bridgework may not be worn into surgery.  Leave your suitcase in the car. After surgery it may be brought to your room.  For patients admitted to the hospital, discharge time is determined by your treatment team.   Patients discharged the day of surgery will not be allowed to drive home.  You will need someone to drive you home and stay with you the night of your procedure.    Please read over the following fact sheets that you were given:     _x___ [place] WITH A SMALL SIP OF WATER. These include:  1. COREG (CARVEDILOL)  2. HYDRAZALINE  3 .ENTRESTO  4. ISOSORBIDE  5.  6.  ____Fleets enema or  Magnesium Citrate as directed.   ____ Use CHG Soap or sage wipes as directed on instruction sheet   ____ Use inhalers on the day of surgery and bring to hospital day of surgery  _X___ Stop Metformin and Janumet 2 days prior to surgery-LAST DOSE OF METFORMIN ON Monday, AUGUST 6TH    ____ Take 1/2 of usual insulin dose the night before surgery and none on the morning surgery.   _x___ Follow recommendations from Cardiologist, Pulmonologist or PCP regarding stopping Aspirin, Coumadin, Pllavix ,Eliquis, Effient, or Pradaxa, and Pletal-PT STATES HE WAS INSTRUCTED TO STOP ASPIRIN 7 DAYS PRIOR TO SURGERY  X____Stop Anti-inflammatories such as Advil, Aleve, Ibuprofen, Motrin, Naproxen, Naprosyn, Goodies powders or aspirin products NOW-OK to take Tylenol .   ____ Stop supplements until after surgery.  B   ____ Bring C-Pap to the hospital.

## 2017-03-26 NOTE — Pre-Procedure Instructions (Signed)
ECG 12-lead1/24/2018 [place] Component Name Value Ref Range  Vent Rate (bpm) 79   QRS Interval (msec) 142   QT Interval (msec) 428   QTc (msec) 490   Result Narrative  Ventricular-paced rhythm Abnormal ECG No previous ECGs available I reviewed and concur with this report. Electronically signed NU:[id number] MD, Darnell Level [phone]) on 09/24/2016 1:17:49 PM  Status Results Details    Office Visit on 09/18/2016 [place]")' [email or web address]

## 2017-03-26 NOTE — Pre-Procedure Instructions (Signed)
Flossie Dibble, MD - 03/13/2017 9:15 AM EDT Formatting of this note may be different from the original. Established Patient Visit   Chief Complaint: Chief Complaint  Patient presents with  . Follow-up  6 mo  . Hypertension  . Cardiomyopathy  Date of Service: 03/13/2017 Date of Birth: 1946-07-08 PCP: Janifer Adie, MD  History of Present Illness: Mr. Adam Wall is a 71 y.o.male patient  Shortness of breath The patient presents with acute on chronic shortness of breath worsening with increased severity over the last 3 months which occurs with moderate exertion but does not limits ADLs associated with walking fast and relived by rest and lasting intermittent (1-10 minutes). Other related symptoms include fatigue. The differential diagnosis includes congestive heart failure, hypertension, valve disease, anginal equivalent and decrease exercise tolerance\ Congestive heart failure The patient has acute on chronic moderate systolic heart failure secondary to coronary artery disease (CAD). Congestive Heart Failure has been manifested by dyspnea with a [place] Functional Class of III. The Patient appears euvolemic. and is on beta-blocker and statin therapy with diuretics unstable over the last 2 weeks  Coronary artery disease The patient does have a history of coronary atherosclerosis and coronary artery risk factors previously diagnosed by imaging studies and laboratory work and now has concerning signs and symptoms consistent with anginal equivalent and/or progression of this atherosclerosis.  Essential hypertension The patient currently has a diagnosis of essential hypertension with no evidence of secondary causes of high blood pressure at this time. The patient has been on appropriate medication management without current evidence of significant side effects. This regimen is for risk reduction of cardiovascular disease and possible future complication. The blood pressure appears stable today.  We have discussed treatment goals and current guidelines for hypertension therapy. The patient understands risks and benefits of medication management and risk factor modification. They agree to continuation of this current medical regimen.  Past Medical and Surgical History  Past Medical History Past Medical History:  Diagnosis Date  . ASHD (arteriosclerotic heart disease)  . Diabetes mellitus type 2, uncomplicated (CMS-HCC)  . Dilated cardiomyopathy (CMS-HCC)  . Hyperlipidemia, unspecified  . Hypertension  . 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 VHD (valvular heart disease)   Past Surgical History He has a past surgical history that includes Hernia repair (Right, 1968); Coronary artery bypass graft (2001); laser-retina (2004); and Colonoscopy (12/24/2012).   Medications and Allergies  Current Medications  Current Outpatient Prescriptions on File Prior to Visit  Medication Sig Dispense Refill  . aspirin 81 MG EC tablet Take 81 mg by mouth daily.  . blood glucose diagnostic test strip Check fasting blood sugar daily. E11.9 100 each 12  . blood glucose meter kit Check fasting blood sugar daily. E11.9 1 each 0  . FUROsemide (LASIX) 40 MG tablet Take 0.5 tablets (20 mg total) by mouth once daily as needed. 90 tablet 1  . gabapentin (NEURONTIN) 100 MG capsule Take 1 capsule (100 mg total) by mouth nightly. Increase to 3 as needed 270 capsule 3  . metFORMIN (GLUCOPHAGE) 1000 MG tablet 1 1/2 in am and 1 in pm 225 tablet 3  . sitaGLIPtin (JANUVIA) 100 MG tablet Take 1 tablet (100 mg total) by mouth once daily. 90 tablet 3   No current facility-administered medications on file prior to visit.   Allergies: Patient has no known allergies.  Social and Family History  Social History reports that he has quit smoking. He has never used smokeless tobacco. He reports that he  does not drink alcohol or use drugs.  Family History Family History  Problem Relation Age of Onset  . Coronary  Artery Disease (Blocked arteries around heart) Mother  . Alzheimer's disease Mother  . Cancer Neg Hx   Review of Systems   Review of Systems  Positive for sob Negative for weight gain weight loss, weakness, vision change, hearing loss, cough, congestion, PND, orthopnea, heartburn, nausea, diaphoresis, vomiting, diarrhea, bloody stool, melena, stomach pain, extremity pain, leg weakness, leg cramping, leg blood clots, blackouts, nosebleed, trouble swallowing, mouth pain, urinary frequency, urination at night, muscle weakness, skin lesions, skin rashes, tingling ,ulcers, numbness, anxiety, and/or depression Physical Examination   Vitals:BP 128/82  Pulse 85  Ht 182.9 cm (6')  Wt 88.9 kg (196 lb)  SpO2 98%  BMI 26.58 kg/m  Ht:182.9 cm (6') Wt:88.9 kg (196 lb) QAE:SLPN surface area is 2.13 meters squared. Body mass index is 26.58 kg/m. Appearance: well appearing in no acute distress HEENT: Pupils equally reactive to light and accomodation, no xanthalasma  Neck: Supple, no apparent thyromegaly, masses, or lymphadenopathy  Lungs: normal respiratory effort; no crackles, no rhonchi, no wheezes Heart: Regular rate and rhythm. Normal S1 S2 No gallops, 3+ AORTIC murmur, no rub, PMI is normal size and placement. carotid upstroke normal with bruit. Jugular venous pressure is normal Abdomen: soft, nontender, not distended with normal bowel sounds. No apparent hepatosplenomegally. Abdominal aorta is normal size without bruit Extremities:traceedema, no ulcers, no clubbing, no cyanosis Peripheral Pulses: 2+ in upper extremities, 2+ femoral pulses bilaterally, 2+lower extremity  Musculoskeletal; Normal muscle tone without kyphosis Neurological: Oriented and Alert, Cranial nerves intact  Assessment   71 y.o. male with  Encounter Diagnoses  Name Primary?  . Chronic systolic CHF (congestive heart failure), NYHA class 2 (CMS-HCC)  . Coronary artery disease involving coronary bypass graft of native  heart without angina pectoris  . Benign essential hypertension  . SOBOE (shortness of breath on exertion) Yes   Plan  -Persantine SESTAMIBI for shortness of breath and angina  -There has been a review of medication management for hypertension control and of the current medical regimen used. The patient understands all the risks and benefits of hypertension control with these medications to reduce possible future cardiovascular complications and risk of disease. There will be no changes in medication regimen necessary today.  Orders Placed This Encounter  Procedures  . NM myocardial perfusion SPECT multiple (stress and rest)  . ECG stress test only  . Echo complete   No Follow-up on file.  Flossie Dibble, MD       Plan of Treatment - as of this encounter  Upcoming Encounters Upcoming Encounters  Date Type Specialty Care Team Description  04/14/2017 Appointment Cardiology Flossie Dibble, MD  [address]  [facility]  [place], [place] [address]  [phone]  [phone] ([phone]    04/14/2017 Appointment Cardiology Flossie Dibble, MD  [address]  [facility]  [place], [place] [address]  [phone]  [phone] ([phone]    04/14/2017 Office Visit Cardiology Flossie Dibble, MD  [address]  [facility]  [place], [place] [address]  [phone]  [phone] ([phone]    06/03/2017 Office Visit Family Medicine [place] [place], Bella Kennedy, MD  [address] [facility]  [place], [place] [address]  [phone]  [phone] (Fax)     Scheduled Tests Scheduled Tests  Name Priority Associated Diagnoses Order Schedule  Echo complete Routine SOBOE (shortness of breath on exertion)  1 Occurrences  starting 03/13/2017 until 03/14/2018  NM myocardial perfusion SPECT multiple (stress and rest) Routine SOBOE (shortness of breath on exertion)  1 Occurrences starting 03/13/2017 until  03/14/2018  ECG stress test only Routine SOBOE (shortness of breath on exertion)  1 Occurrences starting 03/13/2017 until 03/14/2018   Visit Diagnoses   Diagnosis  SOBOE (shortness of breath on exertion) - Primary  Shortness of breath   Chronic systolic CHF (congestive heart failure), NYHA class 2 (CMS-HCC)  Coronary artery disease involving coronary bypass graft of native heart without angina pectoris  Benign essential hypertension  Essential hypertension, benign    Discontinued Medications - as of this encounter  Prescription Sig. Discontinue Reason Start Date End Date  atorvastatin (LIPITOR) 40 MG tablet  Take 1 tablet (40 mg total) by mouth once daily. Reorder 02/27/2017 03/13/2017  carvedilol (COREG) 25 MG tablet  Take 1 tablet (25 mg total) by mouth 2 (two) times daily with meals. Reorder 02/27/2017 03/13/2017  hydrALAZINE (APRESOLINE) 25 MG tablet  Take 1 tablet (25 mg total) by mouth 3 (three) times daily. Reorder 03/20/2016 03/13/2017  isosorbide dinitrate (ISORDIL) 10 MG tablet  Take 1 tablet (10 mg total) by mouth 3 (three) times daily. Reorder 03/20/2016 03/13/2017  sacubitril-valsartan (ENTRESTO) 49-51 mg tablet  Take 1 tablet by mouth 2 (two) times daily. Reorder 03/20/2016 03/13/2017   Images Document Information  Primary Care Provider Janifer Adie MD (Jul. 17, 2017 - Present) [phone] (Work) [phone] (Fax) [place], [place] [address]  Document Coverage Dates Jul. 19, 2018  [place], [place] [address]   Encounter Providers Flossie Dibble MD (Attending) [phone] (Work) [phone] (Fax) [place] [facility] [place], [place] [address]   Encounter Date Jul. 19, 2018

## 2017-03-28 ENCOUNTER — Telehealth: Payer: Self-pay | Admitting: Internal Medicine

## 2017-03-28 ENCOUNTER — Institutional Professional Consult (permissible substitution): Payer: Medicare Other | Admitting: Internal Medicine

## 2017-03-28 NOTE — Telephone Encounter (Signed)
Patient scheduled for EBUS on 04/03/17 at [facility] .  Patient needs instructions and arrival time .  Please advise.

## 2017-03-28 NOTE — Telephone Encounter (Signed)
Procedure 04/03/17 at 1 pm. Pt to arrive at [address] registration. Nothing to eat or drink after midnight the night before.

## 2017-03-28 NOTE — Telephone Encounter (Signed)
Pt aware of date/time/location. D/C asa 81 mg today.

## 2017-04-01 ENCOUNTER — Encounter
Admission: RE | Admit: 2017-04-01 | Discharge: 2017-04-01 | Disposition: A | Discharge status: Home / Self Care | Payer: Medicare Other | Source: Ambulatory Visit | Attending: Internal Medicine | Admitting: Internal Medicine

## 2017-04-01 DIAGNOSIS — Z950 Presence of cardiac pacemaker: Secondary | ICD-10-CM | POA: Diagnosis not present

## 2017-04-01 DIAGNOSIS — I509 Heart failure, unspecified: Secondary | ICD-10-CM | POA: Diagnosis not present

## 2017-04-01 DIAGNOSIS — D649 Anemia, unspecified: Secondary | ICD-10-CM | POA: Diagnosis not present

## 2017-04-01 DIAGNOSIS — I447 Left bundle-branch block, unspecified: Secondary | ICD-10-CM | POA: Diagnosis not present

## 2017-04-01 DIAGNOSIS — R599 Enlarged lymph nodes, unspecified: Secondary | ICD-10-CM | POA: Diagnosis not present

## 2017-04-01 DIAGNOSIS — C9000 Multiple myeloma not having achieved remission: Secondary | ICD-10-CM | POA: Diagnosis not present

## 2017-04-01 DIAGNOSIS — Z87891 Personal history of nicotine dependence: Secondary | ICD-10-CM | POA: Diagnosis not present

## 2017-04-01 DIAGNOSIS — E119 Type 2 diabetes mellitus without complications: Secondary | ICD-10-CM | POA: Diagnosis not present

## 2017-04-01 DIAGNOSIS — E878 Other disorders of electrolyte and fluid balance, not elsewhere classified: Secondary | ICD-10-CM | POA: Diagnosis not present

## 2017-04-01 DIAGNOSIS — I110 Hypertensive heart disease with heart failure: Secondary | ICD-10-CM | POA: Diagnosis not present

## 2017-04-01 HISTORY — DX: Multiple myeloma not having achieved remission: C90.00

## 2017-04-01 HISTORY — DX: Monoclonal gammopathy: D47.2

## 2017-04-01 LAB — POTASSIUM: POTASSIUM: 4.1 mmol/L (ref 3.5–5.1)

## 2017-04-01 LAB — HEMOGLOBIN: HEMOGLOBIN: 9.7 g/dL — AB (ref 13.0–18.0)

## 2017-04-03 ENCOUNTER — Ambulatory Visit: Payer: Medicare Other | Admitting: Certified Registered Nurse Anesthetist

## 2017-04-03 ENCOUNTER — Encounter
Admission: RE | Disposition: A | Discharge status: Home / Self Care | Payer: Self-pay | Source: Ambulatory Visit | Attending: Internal Medicine

## 2017-04-03 ENCOUNTER — Ambulatory Visit
Admission: RE | Admit: 2017-04-03 | Discharge: 2017-04-03 | Disposition: A | Discharge status: Home / Self Care | Payer: Medicare Other | Source: Ambulatory Visit | Attending: Internal Medicine | Admitting: Internal Medicine

## 2017-04-03 DIAGNOSIS — C9000 Multiple myeloma not having achieved remission: Secondary | ICD-10-CM | POA: Insufficient documentation

## 2017-04-03 DIAGNOSIS — R590 Localized enlarged lymph nodes: Secondary | ICD-10-CM | POA: Insufficient documentation

## 2017-04-03 DIAGNOSIS — R591 Generalized enlarged lymph nodes: Secondary | ICD-10-CM | POA: Diagnosis not present

## 2017-04-03 DIAGNOSIS — R599 Enlarged lymph nodes, unspecified: Secondary | ICD-10-CM

## 2017-04-03 DIAGNOSIS — I447 Left bundle-branch block, unspecified: Secondary | ICD-10-CM | POA: Insufficient documentation

## 2017-04-03 DIAGNOSIS — Z87891 Personal history of nicotine dependence: Secondary | ICD-10-CM | POA: Insufficient documentation

## 2017-04-03 DIAGNOSIS — E119 Type 2 diabetes mellitus without complications: Secondary | ICD-10-CM | POA: Insufficient documentation

## 2017-04-03 DIAGNOSIS — I110 Hypertensive heart disease with heart failure: Secondary | ICD-10-CM | POA: Insufficient documentation

## 2017-04-03 DIAGNOSIS — I509 Heart failure, unspecified: Secondary | ICD-10-CM | POA: Insufficient documentation

## 2017-04-03 DIAGNOSIS — D630 Anemia in neoplastic disease: Secondary | ICD-10-CM | POA: Insufficient documentation

## 2017-04-03 DIAGNOSIS — Z950 Presence of cardiac pacemaker: Secondary | ICD-10-CM | POA: Insufficient documentation

## 2017-04-03 HISTORY — PX: ENDOBRONCHIAL ULTRASOUND: SHX5096

## 2017-04-03 LAB — GLUCOSE, CAPILLARY
GLUCOSE-CAPILLARY: 183 mg/dL — AB (ref 65–99)
GLUCOSE-CAPILLARY: 163 mg/dL — AB (ref 65–99)

## 2017-04-03 SURGERY — ENDOBRONCHIAL ULTRASOUND (EBUS)
Anesthesia: General

## 2017-04-03 MED ORDER — SUGAMMADEX SODIUM 200 MG/2ML IV SOLN
INTRAVENOUS | Status: DC | PRN
  Administered 2017-04-03: 180 mg via INTRAVENOUS

## 2017-04-03 MED ORDER — FAMOTIDINE 20 MG PO TABS
ORAL_TABLET | ORAL | Status: AC
  Administered 2017-04-03: 20 mg via ORAL
  Filled 2017-04-03: qty 1

## 2017-04-03 MED ORDER — SODIUM CHLORIDE 0.9 % IV SOLN
INTRAVENOUS | Status: DC
  Administered 2017-04-03 (×2): via INTRAVENOUS

## 2017-04-03 MED ORDER — LIDOCAINE HCL (CARDIAC) 20 MG/ML IV SOLN
INTRAVENOUS | Status: DC | PRN
  Administered 2017-04-03: 60 mg via INTRAVENOUS

## 2017-04-03 MED ORDER — ONDANSETRON HCL 4 MG/2ML IJ SOLN
INTRAMUSCULAR | Status: DC | PRN
  Administered 2017-04-03: 4 mg via INTRAVENOUS

## 2017-04-03 MED ORDER — SUCCINYLCHOLINE CHLORIDE 20 MG/ML IJ SOLN
INTRAMUSCULAR | Status: DC | PRN
  Administered 2017-04-03: 100 mg via INTRAVENOUS

## 2017-04-03 MED ORDER — FENTANYL CITRATE (PF) 100 MCG/2ML IJ SOLN
INTRAMUSCULAR | Status: AC
  Filled 2017-04-03: qty 2

## 2017-04-03 MED ORDER — PROPOFOL 10 MG/ML IV BOLUS
INTRAVENOUS | Status: AC
  Filled 2017-04-03: qty 20

## 2017-04-03 MED ORDER — ROCURONIUM BROMIDE 100 MG/10ML IV SOLN
INTRAVENOUS | Status: DC | PRN
  Administered 2017-04-03: 10 mg via INTRAVENOUS
  Administered 2017-04-03: 5 mg via INTRAVENOUS

## 2017-04-03 MED ORDER — FENTANYL CITRATE (PF) 100 MCG/2ML IJ SOLN: 25.0000 ug | INTRAMUSCULAR | Status: DC | PRN

## 2017-04-03 MED ORDER — FENTANYL CITRATE (PF) 100 MCG/2ML IJ SOLN
INTRAMUSCULAR | Status: DC | PRN
  Administered 2017-04-03: 100 ug via INTRAVENOUS

## 2017-04-03 MED ORDER — PROPOFOL 10 MG/ML IV BOLUS
INTRAVENOUS | Status: DC | PRN
  Administered 2017-04-03: 150 mg via INTRAVENOUS

## 2017-04-03 MED ORDER — FAMOTIDINE 20 MG PO TABS
20.0000 mg | ORAL_TABLET | Freq: Once | ORAL | Status: AC
Start: 2017-04-03 — End: 2017-04-03
  Administered 2017-04-03: 20 mg via ORAL

## 2017-04-03 MED ORDER — ONDANSETRON HCL 4 MG/2ML IJ SOLN
INTRAMUSCULAR | Status: AC
  Filled 2017-04-03: qty 2

## 2017-04-03 MED ORDER — ROCURONIUM BROMIDE 50 MG/5ML IV SOLN
INTRAVENOUS | Status: AC
  Filled 2017-04-03: qty 1

## 2017-04-03 MED ORDER — ONDANSETRON HCL 4 MG/2ML IJ SOLN: 4.0000 mg | Freq: Once | INTRAMUSCULAR | Status: DC | PRN

## 2017-04-03 MED ORDER — DEXAMETHASONE SODIUM PHOSPHATE 10 MG/ML IJ SOLN
INTRAMUSCULAR | Status: AC
  Filled 2017-04-03: qty 1

## 2017-04-03 MED ORDER — SUCCINYLCHOLINE CHLORIDE 20 MG/ML IJ SOLN
INTRAMUSCULAR | Status: AC
  Filled 2017-04-03: qty 1

## 2017-04-03 MED ORDER — DEXAMETHASONE SODIUM PHOSPHATE 10 MG/ML IJ SOLN
INTRAMUSCULAR | Status: DC | PRN
  Administered 2017-04-03: 5 mg via INTRAVENOUS

## 2017-04-03 NOTE — Op Note (Signed)
PROCEDURE: ENDOBRONCHIAL ULTRASOUND   PROCEDURE DATE: 04/03/2017  TIME:  NAME:  Adam Wall  DOB:09/01/1945  MRN: [id number] LOC:  ARPO/None    HOSP DAY: @LENGTHOFSTAYDAYS @ CODE STATUS:   Code Status History    This patient does not have a recorded code status. Please follow your organizational policy for patients in this situation.          Indications/Preliminary Diagnosis: adenopathy  Consent: (Place X beside choice/s below)  The benefits, risks and possible complications of the procedure were        explained to:  __x_ patient  __x_ patient's family  ___ other:___________  who verbalized understanding and gave:  ___ verbal  ___ written  __x_ verbal and written  ___ telephone  ___ other:________ consent.      Unable to obtain consent; procedure performed on emergent basis.     Other:       PRESEDATION ASSESSMENT: History and Physical has been performed. Patient meds and allergies have been reviewed. Presedation airway examination has been performed and documented. Baseline vital signs, sedation score, oxygenation status, and cardiac rhythm were reviewed. Patient was deemed to be in satisfactory condition to undergo the procedure.    PREMEDICATIONS: SEE ANESTHESIOLOGY RECORDS   Airway Prep (Place X beside choice below)   1% Transtracheal Lidocaine Anesthetization 7 cc   Patient prepped per Bronchoscopy Lab Policy       Insertion Route (Place X beside choice below)   Nasal   Oral  x Endotracheal Tube   Tracheostomy   INTRAPROCEDURE MEDICATIONS: SEE ANESTHESIOLOGY RECORDS   PROCEDURE DETAILS: Timeout performed and correct patient, name, & ID confirmed. Following prep per Pulmonary policy, appropriate sedation was administered.  Airway exam proceeded with findings, technical procedures, and specimen collection as noted below. At the end of exam the scope was withdrawn without incident. Impression and Plan as noted below.       TECHNICAL PROCEDURES: (Place  X beside choice below)   Procedures  Description    None     Electrocautery     Cryotherapy     Balloon Dilatation     Bronchography     Stent Placement     Therapeutic Aspiration     Laser/Argon Plasma    Brachytherapy Catheter Placement    Foreign Body Removal     SPECIMENS (Sites): (Place X beside choice below)  Specimens Description   No Specimens Obtained     [place]    Lavage    Biopsies   x Fine Needle Aspirates 3 passes, transbronchial   Brushings    Sputum    FINDINGS: very small and discrete multiple lymph nodes, no masses seen, "shotty" lymph nodes around trachea, multiple blood vessels surrounding adenopathy  I stopped procedure due to increased risk of bleeding  ESTIMATED BLOOD LOSS: none COMPLICATIONS/RESOLUTION: none       IMPRESSION:POST-PROCEDURE DX:  Adenopathy ?etiology   RECOMMENDATION/PLAN:  Follow up pathology reports Recommend repeat CT chest   Juliani Laduke Patricia Pesa, M.D.  [facility]  Medical Director [place] Director [place] Department

## 2017-04-03 NOTE — Anesthesia Postprocedure Evaluation (Signed)
Anesthesia Post Note  Patient: Adam Wall  Procedure(s) Performed: Procedure(s) (LRB): ENDOBRONCHIAL ULTRASOUND (N/A)  Patient location during evaluation: PACU Anesthesia Type: General Level of consciousness: awake and alert Pain management: pain level controlled Vital Signs Assessment: post-procedure vital signs reviewed and stable Respiratory status: spontaneous breathing, nonlabored ventilation, respiratory function stable and patient connected to nasal cannula oxygen Cardiovascular status: blood pressure returned to baseline and stable Postop Assessment: no signs of nausea or vomiting Anesthetic complications: no     Last Vitals:  Vitals:   04/03/17 1432 04/03/17 1505  BP: (!) 199/79 (!) 196/73  Pulse: 65 66  Resp: 18 18  Temp: (!) 36.1 C   SpO2: 97% 98%    Last Pain:  Vitals:   04/03/17 1432  TempSrc: Temporal                 Martha Clan

## 2017-04-03 NOTE — Anesthesia Procedure Notes (Signed)
Procedure Name: Intubation Date/Time: 04/03/2017 1:06 PM Performed by: Dionne Bucy Pre-anesthesia Checklist: Patient identified, Patient being monitored, Timeout performed, Emergency Drugs available and Suction available Patient Re-evaluated:Patient Re-evaluated prior to induction Oxygen Delivery Method: Circle system utilized Preoxygenation: Pre-oxygenation with 100% oxygen Induction Type: IV induction Ventilation: Mask ventilation without difficulty Laryngoscope Size: Mac and 4 Grade View: Grade I Tube type: Oral Tube size: 8.0 mm Number of attempts: 1 Airway Equipment and Method: Stylet Placement Confirmation: ETT inserted through vocal cords under direct vision,  positive ETCO2 and breath sounds checked- equal and bilateral Secured at: 22 cm Tube secured with: Tape Dental Injury: Teeth and Oropharynx as per pre-operative assessment

## 2017-04-03 NOTE — Transfer of Care (Signed)
Immediate Anesthesia Transfer of Care Note  Patient: Adam Wall  Procedure(s) Performed: Procedure(s): ENDOBRONCHIAL ULTRASOUND (N/A)  Patient Location: PACU  Anesthesia Type:General  Level of Consciousness: sedated  Airway & Oxygen Therapy: Patient Spontanous Breathing and Patient connected to face mask oxygen  Post-op Assessment: Report given to RN and Post -op Vital signs reviewed and stable  Post vital signs: Reviewed and stable  Last Vitals:  Vitals:   04/03/17 1128 04/03/17 1341  BP: (!) 156/71 (!) (P) 201/77  Pulse: 69 72  Resp: 18 18  Temp: 36.4 C (!) (P) 36.1 C  SpO2: 99% 99%    Last Pain:  Vitals:   04/03/17 1128  TempSrc: Tympanic         Complications: No apparent anesthesia complications

## 2017-04-03 NOTE — Anesthesia Post-op Follow-up Note (Signed)
Anesthesia QCDR form completed.        

## 2017-04-03 NOTE — Discharge Instructions (Signed)

## 2017-04-03 NOTE — H&P (View-Only) (Signed)
PULMONARY CONSULT NOTE  Requesting MD/Service: Janese Banks Date of initial consultation: 03/14/17 Reason for consultation:   PT PROFILE: 71 y.o. male referred for evaluation of mild mediastinal and hilar adenopathy  DATA: 10/28/16 PET scan: IMPRESSION: 1. FDG avid hilar and mediastinal nodes. These nodes are nonspecific but would represent an unusual metastatic pattern for multiple myeloma. These nodes could be reactive. Lymphoma or metastatic disease from a another unidentified source is not completely excluded on this study. Recommend clinical correlation and attention on follow-up. 2. Mild uptake in the left posterior T8 and T10 vertebral bodies with no CT correlate. These findings are not diagnostic for myelomatous involvement. Recommend attention on follow-up. 3. Uptake in the left posterior iliac bone is likely due to recent biopsy. 4. Uptake in the distal esophagus with mild esophageal wall thickening. Given the CT appearance and level of uptake, I suspect esophagitis rather than underlying neoplasm. This could be further worked up with direct visualization or upper GI as clinically warranted.  02/19/17 CT chest: IMPRESSION: 1. There appears to be a mass associated with the right kidney, only partially imaged. This region was not as well evaluated on a previous PET-CT as above. However, I am concerned for a possible solid renal mass which could represent a renal cell carcinoma. The patient has a pacemaker and is likely not eligible for an MRI. Recommend a CT scan with a renal mass protocol for better assessment. 2. Mild adenopathy in the mediastinum and hila on the previous PET-CT which was mildly FDG avid is stable on CT images today. These nodes are not specific, especially given the possibility of a right renal mass. 3. Thickening of the distal esophagus is favored to represent esophagitis. 4. A few lucent lesions are seen in the upper thoracic spine as above. In  retrospect, they are probably unchanged since the PET-CT. They demonstrated no abnormal FDG uptake on the PET-CT. Recommend attention on follow-up. 5. Atherosclerosis in the aorta. These results will be called to the ordering clinician or representative by the Radiologist Assistant, and communication documented in the PACS or zVision Dashboard.  HPI:  As above. The patient has been seen by heme onc for chronic anemia. A bone marrow biopsy was performed in February 2018 which revealed plasma cell neoplasm. A PET scan was performed as noted above. The CT chest was performed in evaluation of the adenopathy noted on PET scan. He is referred now for consideration of mediastinal lymph node biopsy. He presently has no significant restaurant symptoms. He has minimal smoking history (only smoked occasional cigars for a few years). He has no other respiratory symptoms. The plan is for resection of the apparent right kidney tumor assuming the mediastinal lymphadenopathy does not represent metastatic disease.  Past Medical History:  Diagnosis Date  . Anemia   . Blockage of coronary artery of heart ([facility])   . CHF (congestive heart failure) ([facility])   . Diabetes mellitus without complication ([place])   . Hypertension   . Hypochloremia   . Neuropathy     Past Surgical History:  Procedure Laterality Date  . CARDIAC SURGERY    . Heart Bypass    . PACEMAKER PLACEMENT      MEDICATIONS: I have reviewed all medications and confirmed regimen as documented  Social History   Social History  . Marital status: Married    Spouse name: N/A  . Number of children: N/A  . Years of education: N/A   Occupational History  . Not on file.   Social  History Main Topics  . Smoking status: Former Smoker    Quit date: 10/04/1993  . Smokeless tobacco: Never Used  . Alcohol use No  . Drug use: No  . Sexual activity: Not on file   Other Topics Concern  . Not on file   Social History Narrative  . No narrative on  file    Family History  Problem Relation Age of Onset  . Heart failure Paternal Grandmother     ROS: No fever, myalgias/arthralgias, unexplained weight loss or weight gain No new focal weakness or sensory deficits No otalgia, hearing loss, visual changes, nasal and sinus symptoms, mouth and throat problems No neck pain or adenopathy No abdominal pain, N/V/D, diarrhea, change in bowel pattern No dysuria, change in urinary pattern   Vitals:   03/14/17 1039  BP: 100/78  Pulse: 85  Resp: 16  SpO2: 97%  Weight: 197 lb (89.4 kg)  Height: 6' (1.829 m)     EXAM:  Gen: WDWN, No overt respiratory distress HEENT: NCAT, sclera white, oropharynx normal Neck: Supple without LAN, thyromegaly, JVD Lungs: breath sounds Full, percussion normal, no adventitious sounds  Cardiovascular: RRR, soft systolic murmur radiating towards neck Abdomen: Soft, nontender, normal BS Ext: without clubbing, cyanosis, edema Neuro: CNs grossly intact, motor and sensory intact Skin: Limited exam, no lesions noted  DATA:   BMP Latest Ref Rng & Units 02/19/2017 10/04/2016  Glucose 65 - 99 mg/dL 199(H) 231(H)  BUN 6 - 20 mg/dL 19 22(H)  Creatinine 0.61 - 1.24 mg/dL 1.30(H) 1.15  Sodium 135 - 145 mmol/L 136 135  Potassium 3.5 - 5.1 mmol/L 4.5 4.3  Chloride 101 - 111 mmol/L 105 103  CO2 22 - 32 mmol/L 27 27  Calcium 8.9 - 10.3 mg/dL 9.1 9.2    CBC Latest Ref Rng & Units 02/19/2017 10/14/2016 10/04/2016  WBC 3.8 - 10.6 K/uL 6.1 6.2 4.7  Hemoglobin 13.0 - 18.0 g/dL 9.7(L) 9.9(L) 10.0(L)  Hematocrit 40.0 - 52.0 % 30.3(L) 30.6(L) 31.4(L)  Platelets 150 - 440 K/uL 200 200 195    CXR:    IMPRESSION:   1) multiple myeloma 2) right kidney mass 3) mediastinal and hilar lymph adenopathy positive on PET scan. Concern for metastatic disease.  PLAN:  He was discussed previously at tumor board. The plan as outlined during that meeting was to proceed with EBUS and lymph node biopsy. I have discussed this procedure  with him in detail including its indication, risks and alternatives. Given his overall good cardiac and pulmonary status, he is low risk for major complications. He agrees to proceed. I have discussed this with Dr. Mortimer Fries who has also reviewed the CT chest. The procedure will be arranged by Dr. Mortimer Fries. Further follow-up in pulmonary office will be arranged as needed   Merton Border, MD PCCM service Mobile [phone] Pager [phone] 03/16/2017 2:27 PM

## 2017-04-03 NOTE — Interval H&P Note (Signed)
History and Physical Interval Note:  04/03/2017 12:27 PM  Adam Wall  has presented today for surgery, with the diagnosis of LUNG MASS  The various methods of treatment have been discussed with the patient and family. After consideration of risks, benefits and other options for treatment, the patient has consented to  Procedure(s): ENDOBRONCHIAL ULTRASOUND (N/A) as a surgical intervention .  The patient's history has been reviewed, patient examined, no change in status, stable for surgery.  I have reviewed the patient's chart and labs.  Questions were answered to the patient's satisfaction.     Flora Lipps

## 2017-04-03 NOTE — Anesthesia Preprocedure Evaluation (Signed)
Anesthesia Evaluation  Patient identified by MRN, date of birth, ID band Patient awake    Reviewed: Allergy & Precautions, H&P , NPO status , Patient's Chart, lab work & pertinent test results, reviewed documented beta blocker date and time   History of Anesthesia Complications Negative for: history of anesthetic complications  Airway Mallampati: I  TM Distance: >3 FB Neck ROM: full    Dental  (+) Partial Lower, Dental Advidsory Given   Pulmonary neg pulmonary ROS, former smoker,           Cardiovascular Exercise Tolerance: Good hypertension, On Medications and On Home Beta Blockers (-) angina+ CAD, + CABG, + Peripheral Vascular Disease and +CHF  (-) Past MI and (-) Cardiac Stents + dysrhythmias (-) pacemaker+ Cardiac Defibrillator + Valvular Problems/Murmurs      Neuro/Psych negative neurological ROS  negative psych ROS   GI/Hepatic negative GI ROS, Neg liver ROS,   Endo/Other  diabetes  Renal/GU negative Renal ROS  negative genitourinary   Musculoskeletal   Abdominal   Peds  Hematology negative hematology ROS (+)   Anesthesia Other Findings Past Medical History: No date: AICD (automatic cardioverter/defibrillator) present No date: Anemia No date: Blockage of coronary artery of heart (HCC) No date: CHF (congestive heart failure) (HCC) No date: Diabetes mellitus without complication (HCC) No date: Dysrhythmia No date: Heart murmur No date: Hypertension No date: Hypochloremia No date: LBBB (left bundle branch block) No date: Neuropathy No date: Right renal mass No date: Smoldering myeloma (HCC)   Reproductive/Obstetrics negative OB ROS                             Anesthesia Physical Anesthesia Plan  ASA: III  Anesthesia Plan: General   Post-op Pain Management:    Induction: Intravenous  PONV Risk Score and Plan: 2 and Ondansetron and Dexamethasone  Airway Management  Planned: Oral ETT  Additional Equipment:   Intra-op Plan:   Post-operative Plan: Extubation in OR  Informed Consent: I have reviewed the patients History and Physical, chart, labs and discussed the procedure including the risks, benefits and alternatives for the proposed anesthesia with the patient or authorized representative who has indicated his/her understanding and acceptance.   Dental Advisory Given  Plan Discussed with: Anesthesiologist, CRNA and Surgeon  Anesthesia Plan Comments:         Anesthesia Quick Evaluation

## 2017-04-04 ENCOUNTER — Encounter: Payer: Self-pay | Admitting: Internal Medicine

## 2017-04-04 LAB — CYTOLOGY - NON PAP

## 2017-05-16 ENCOUNTER — Other Ambulatory Visit: Payer: Medicare Other

## 2017-05-22 DIAGNOSIS — N189 Chronic kidney disease, unspecified: Secondary | ICD-10-CM

## 2017-05-22 HISTORY — DX: Chronic kidney disease, unspecified: N18.9

## 2017-05-23 ENCOUNTER — Ambulatory Visit: Payer: Medicare Other | Admitting: Oncology

## 2017-07-29 ENCOUNTER — Other Ambulatory Visit: Payer: Medicare Other

## 2017-07-30 ENCOUNTER — Inpatient Hospital Stay: Payer: Medicare Other | Attending: Oncology

## 2017-07-30 DIAGNOSIS — M7989 Other specified soft tissue disorders: Secondary | ICD-10-CM | POA: Insufficient documentation

## 2017-07-30 DIAGNOSIS — C9000 Multiple myeloma not having achieved remission: Secondary | ICD-10-CM

## 2017-07-30 DIAGNOSIS — N2889 Other specified disorders of kidney and ureter: Secondary | ICD-10-CM

## 2017-07-30 DIAGNOSIS — I129 Hypertensive chronic kidney disease with stage 1 through stage 4 chronic kidney disease, or unspecified chronic kidney disease: Secondary | ICD-10-CM | POA: Insufficient documentation

## 2017-07-30 DIAGNOSIS — M549 Dorsalgia, unspecified: Secondary | ICD-10-CM | POA: Insufficient documentation

## 2017-07-30 DIAGNOSIS — F17210 Nicotine dependence, cigarettes, uncomplicated: Secondary | ICD-10-CM | POA: Insufficient documentation

## 2017-07-30 DIAGNOSIS — R590 Localized enlarged lymph nodes: Secondary | ICD-10-CM | POA: Diagnosis not present

## 2017-07-30 DIAGNOSIS — R011 Cardiac murmur, unspecified: Secondary | ICD-10-CM | POA: Diagnosis not present

## 2017-07-30 DIAGNOSIS — I509 Heart failure, unspecified: Secondary | ICD-10-CM | POA: Insufficient documentation

## 2017-07-30 DIAGNOSIS — D509 Iron deficiency anemia, unspecified: Secondary | ICD-10-CM | POA: Insufficient documentation

## 2017-07-30 DIAGNOSIS — N189 Chronic kidney disease, unspecified: Secondary | ICD-10-CM | POA: Insufficient documentation

## 2017-07-30 DIAGNOSIS — G629 Polyneuropathy, unspecified: Secondary | ICD-10-CM | POA: Insufficient documentation

## 2017-07-30 DIAGNOSIS — Z7982 Long term (current) use of aspirin: Secondary | ICD-10-CM | POA: Diagnosis not present

## 2017-07-30 DIAGNOSIS — Z95810 Presence of automatic (implantable) cardiac defibrillator: Secondary | ICD-10-CM | POA: Diagnosis not present

## 2017-07-30 DIAGNOSIS — D563 Thalassemia minor: Secondary | ICD-10-CM | POA: Insufficient documentation

## 2017-07-30 DIAGNOSIS — E119 Type 2 diabetes mellitus without complications: Secondary | ICD-10-CM | POA: Diagnosis not present

## 2017-07-30 DIAGNOSIS — Z79899 Other long term (current) drug therapy: Secondary | ICD-10-CM | POA: Diagnosis not present

## 2017-07-30 DIAGNOSIS — R5383 Other fatigue: Secondary | ICD-10-CM | POA: Insufficient documentation

## 2017-07-30 LAB — CBC
HCT: 22.9 % — ABNORMAL LOW (ref 40.0–52.0)
HEMOGLOBIN: 7.5 g/dL — AB (ref 13.0–18.0)
MCH: 23.7 pg — ABNORMAL LOW (ref 26.0–34.0)
MCHC: 32.5 g/dL (ref 32.0–36.0)
MCV: 72.8 fL — ABNORMAL LOW (ref 80.0–100.0)
PLATELETS: 131 10*3/uL — AB (ref 150–440)
RBC: 3.15 MIL/uL — AB (ref 4.40–5.90)
RDW: 21.1 % — ABNORMAL HIGH (ref 11.5–14.5)
WBC: 4.1 10*3/uL (ref 3.8–10.6)

## 2017-07-30 LAB — COMPREHENSIVE METABOLIC PANEL
ALBUMIN: 3.3 g/dL — AB (ref 3.5–5.0)
ALK PHOS: 120 U/L (ref 38–126)
ALT: 92 U/L — ABNORMAL HIGH (ref 17–63)
ANION GAP: 6 (ref 5–15)
AST: 57 U/L — ABNORMAL HIGH (ref 15–41)
BUN: 65 mg/dL — ABNORMAL HIGH (ref 6–20)
CALCIUM: 9.2 mg/dL (ref 8.9–10.3)
CHLORIDE: 103 mmol/L (ref 101–111)
CO2: 27 mmol/L (ref 22–32)
Creatinine, Ser: 3.26 mg/dL — ABNORMAL HIGH (ref 0.61–1.24)
GFR calc Af Amer: 20 mL/min — ABNORMAL LOW (ref >60)
GFR calc non Af Amer: 17 mL/min — ABNORMAL LOW (ref >60)
GLUCOSE: 128 mg/dL — AB (ref 65–99)
Potassium: 4.3 mmol/L (ref 3.5–5.1)
SODIUM: 136 mmol/L (ref 135–145)
Total Bilirubin: 0.5 mg/dL (ref 0.3–1.2)
Total Protein: 8.9 g/dL — ABNORMAL HIGH (ref 6.5–8.1)

## 2017-07-30 LAB — IRON AND TIBC
Iron: 43 ug/dL — ABNORMAL LOW (ref 45–182)
SATURATION RATIOS: 22 % (ref 17.9–39.5)
TIBC: 195 ug/dL — ABNORMAL LOW (ref 250–450)
UIBC: 152 ug/dL

## 2017-07-30 LAB — FERRITIN: FERRITIN: 169 ng/mL (ref 24–336)

## 2017-07-31 ENCOUNTER — Other Ambulatory Visit: Payer: Self-pay | Admitting: *Deleted

## 2017-07-31 ENCOUNTER — Encounter: Payer: Self-pay | Admitting: Oncology

## 2017-07-31 ENCOUNTER — Inpatient Hospital Stay (HOSPITAL_BASED_OUTPATIENT_CLINIC_OR_DEPARTMENT_OTHER): Payer: Medicare Other | Admitting: Oncology

## 2017-07-31 ENCOUNTER — Telehealth: Payer: Self-pay | Admitting: *Deleted

## 2017-07-31 ENCOUNTER — Other Ambulatory Visit: Payer: Self-pay | Admitting: Oncology

## 2017-07-31 ENCOUNTER — Inpatient Hospital Stay: Payer: Medicare Other

## 2017-07-31 ENCOUNTER — Telehealth: Payer: Self-pay | Admitting: Oncology

## 2017-07-31 VITALS — BP 135/70 | HR 71 | Temp 97.1°F | Resp 18 | Wt 207.9 lb

## 2017-07-31 DIAGNOSIS — D563 Thalassemia minor: Secondary | ICD-10-CM

## 2017-07-31 DIAGNOSIS — M7989 Other specified soft tissue disorders: Secondary | ICD-10-CM

## 2017-07-31 DIAGNOSIS — R011 Cardiac murmur, unspecified: Secondary | ICD-10-CM | POA: Diagnosis not present

## 2017-07-31 DIAGNOSIS — Z95810 Presence of automatic (implantable) cardiac defibrillator: Secondary | ICD-10-CM

## 2017-07-31 DIAGNOSIS — C9000 Multiple myeloma not having achieved remission: Secondary | ICD-10-CM

## 2017-07-31 DIAGNOSIS — F17210 Nicotine dependence, cigarettes, uncomplicated: Secondary | ICD-10-CM

## 2017-07-31 DIAGNOSIS — I509 Heart failure, unspecified: Secondary | ICD-10-CM | POA: Diagnosis not present

## 2017-07-31 DIAGNOSIS — N189 Chronic kidney disease, unspecified: Principal | ICD-10-CM

## 2017-07-31 DIAGNOSIS — R590 Localized enlarged lymph nodes: Secondary | ICD-10-CM

## 2017-07-31 DIAGNOSIS — G629 Polyneuropathy, unspecified: Secondary | ICD-10-CM

## 2017-07-31 DIAGNOSIS — E119 Type 2 diabetes mellitus without complications: Secondary | ICD-10-CM | POA: Diagnosis not present

## 2017-07-31 DIAGNOSIS — D509 Iron deficiency anemia, unspecified: Secondary | ICD-10-CM

## 2017-07-31 DIAGNOSIS — D631 Anemia in chronic kidney disease: Secondary | ICD-10-CM

## 2017-07-31 DIAGNOSIS — R5383 Other fatigue: Secondary | ICD-10-CM | POA: Diagnosis not present

## 2017-07-31 DIAGNOSIS — D472 Monoclonal gammopathy: Secondary | ICD-10-CM

## 2017-07-31 DIAGNOSIS — Z7982 Long term (current) use of aspirin: Secondary | ICD-10-CM

## 2017-07-31 DIAGNOSIS — D649 Anemia, unspecified: Secondary | ICD-10-CM

## 2017-07-31 DIAGNOSIS — Z79899 Other long term (current) drug therapy: Secondary | ICD-10-CM

## 2017-07-31 DIAGNOSIS — I129 Hypertensive chronic kidney disease with stage 1 through stage 4 chronic kidney disease, or unspecified chronic kidney disease: Secondary | ICD-10-CM | POA: Diagnosis not present

## 2017-07-31 LAB — PREPARE RBC (CROSSMATCH)

## 2017-07-31 LAB — KAPPA/LAMBDA LIGHT CHAINS
Kappa free light chain: 1030.3 mg/L — ABNORMAL HIGH (ref 3.3–19.4)
Kappa, lambda light chain ratio: 48.37 — ABNORMAL HIGH (ref 0.26–1.65)
Lambda free light chains: 21.3 mg/L (ref 5.7–26.3)

## 2017-07-31 LAB — ABO/RH: ABO/RH(D): O POS

## 2017-07-31 LAB — SAMPLE TO BLOOD BANK

## 2017-07-31 NOTE — Progress Notes (Addendum)
Hematology/Oncology Consult note [facility]  Telephone:([phone] [phone]  Patient Care Team: [name], MD as PCP - General (Pediatrics)   Name of the patient: Adam Wall  [id number]  [birth year]   Date of visit: 07/31/17  Diagnosis- 1. Smoldering multiple myeloma 2. RCC s/p right nephrectomy 3. Mediastinal adenopathy of uncertain etiology 4. Iron deficiency anemia   Chief complaint/ Reason for visit- routine f/u of smoldering multiple myeloma  Heme/Onc history: patient is a 70 year old male with a past medical history significant for congestive heart failure with an ICD in place, hypertension and diabetes among other comorbidities. Recent SPEP on 09/19/2016 revealed a monoclonal M spike of 1.9 g of IgG kappa. Recent CBC showed white count of 5.5, H&H of 10.1/31.9 with an MCV of 69 of note patient has always been anemic with a hemoglobin between 9.6-10.7 over the last 2 years. Also his MCV has always been between 68-70 inthe past. Iron studies from 06/13/2016 revealed ferritin of 18 and a low TIBC of 224. 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 CMP has always revealed normal kidney function and normal calcium level.  Results of blood work from 10/04/2016 breast follows: CBC showed white count of 4.7, H&H of 10/31.4 with an MCV of 67.8 and a platelet count of 195. CMP was within normal limits with albumin of 3.3 and a calcium of 9.2. Beta 2 microglobulin was mildly elevated at 3. and LDH was mildly low at 90. Ferritin was low at 22 and iron studies showed a low serum iron of 39 and a TIBC low at 230. 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 B12 level was normal at 331.  Patient's chronic anemia has been attributed to iron deficiency and thalassemia minor causing microcytosis  Quantitative immunoglobulins revealed an elevated IgG of 2875 and IgG kappa monoclonal M spike of 2.3 g. kappa lambda free light chain ratio  was abnormal at 14.45  Bone marrow biopsy showed hypercellular bone marrow with 36% atypical plasma cells. Cytogenetics and FISH studies were normal  Skeletal survey showed no definitive evidence of lytic destruction or lesions inthe skeleton.  PET scan showed no lytic lesions.  Right renal mass was noted as well as FDG avid mediastinal adenopathy  24 hour urine protein showed 610 mg and M spike of 66 mg  Patient was referred to pulmonary for a EBUS for mediastinal adenopathy.  At the time of procedure the lymph nodes were noted to be small and shotty and procedure was abandoned due to risk of bleeding.  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  Biopsy was complicated by bleeding complications for which patient was admitted.  He subsequently underwent laparoscopic right radical nephrectomy in September 2018.  Post surgery patient had worsening renal functions.  His postoperative course was complicated by bacteremia and urinary tract infection for which she was again hospitalized.  Creatinine at that time was 3.5.  Also his hemoglobin went down from a baseline of 10 down to 7.2.  He did receive blood transfusion sometime in October 2018.  He is also being followed by [facility] nephrology.   Interval history- patient has had 3 back-to-back hospitalizations over the last 3 months.  He does report feeling being fatigued.  Also reports some leg swelling.  ECOG PS- 1 Pain scale- 0   Review of systems- Review of Systems  Constitutional: Positive for malaise/fatigue.  Cardiovascular: Positive  for leg swelling.      No Known Allergies   Past Medical History:  Diagnosis Date  . AICD (automatic cardioverter/defibrillator) present   . Anemia   . Blockage of coronary artery of heart ([place])   . CHF (congestive heart failure) ([place])   . Diabetes mellitus without complication ([place])   . Dysrhythmia   . Heart murmur   .  Hypertension   . Hypochloremia   . LBBB (left bundle branch block)   . Neuropathy   . Right renal mass   . Smoldering myeloma [facility])      Past Surgical History:  Procedure Laterality Date  . CARDIAC SURGERY    . ENDOBRONCHIAL ULTRASOUND N/A 04/03/2017   Procedure: ENDOBRONCHIAL ULTRASOUND;  Surgeon: Flora Lipps, MD;  Location: [facility] ORS;  Service: Cardiopulmonary;  Laterality: N/A;  . Heart Bypass    . PACEMAKER PLACEMENT     ICD    Social History   Socioeconomic History  . Marital status: Married    Spouse name: Not on file  . Number of children: Not on file  . Years of education: Not on file  . Highest education level: Not on file  Social Needs  . Financial resource strain: Not on file  . Food insecurity - worry: Not on file  . Food insecurity - inability: Not on file  . Transportation needs - medical: Not on file  . Transportation needs - non-medical: Not on file  Occupational History  . Not on file  Tobacco Use  . Smoking status: Former Smoker    Years: 10.00    Types: Cigars    Last attempt to quit: 10/04/1993    Years since quitting: 23.8  . Smokeless tobacco: Never Used  . Tobacco comment: 3 CIGARS PER DAY  Substance and Sexual Activity  . Alcohol use: No  . Drug use: No  . Sexual activity: Not on file  Other Topics Concern  . Not on file  Social History Narrative  . Not on file    Family History  Problem Relation Age of Onset  . Heart failure Paternal Grandmother      Current Outpatient Medications:  .  acetaminophen (TYLENOL) 500 MG tablet, Take 1,000 mg by mouth every 8 (eight) hours as needed for mild pain or moderate pain., Disp: , Rfl:  .  aspirin 81 MG tablet, Take 81 mg by mouth daily., Disp: , Rfl:  .  atorvastatin (LIPITOR) 40 MG tablet, Take 40 mg by mouth at bedtime., Disp: , Rfl:  .  carvedilol (COREG) 25 MG tablet, Take 25 mg by mouth 2 (two) times daily with a meal., Disp: , Rfl:  .  furosemide (LASIX) 40 MG tablet, Take 20 mg by mouth  daily., Disp: , Rfl:  .  gabapentin (NEURONTIN) 100 MG capsule, Take 100 mg by mouth daily as needed. , Disp: , Rfl:  .  hydrALAZINE (APRESOLINE) 25 MG tablet, Take 25 mg by mouth 3 (three) times daily., Disp: , Rfl:  .  isosorbide dinitrate (ISORDIL) 10 MG tablet, Take 10 mg by mouth 3 (three) times daily., Disp: , Rfl:  .  metFORMIN (GLUCOPHAGE) 1000 MG tablet, Take 1,000-1,500 mg by mouth 2 (two) times daily with a meal. Takes 1500 mg in the morning and 1000 mg in the evening, Disp: , Rfl:  .  sacubitril-valsartan (ENTRESTO) 49-51 MG, Take 1 tablet by mouth 2 (two) times daily., Disp: , Rfl:  .  sitaGLIPtin (JANUVIA) 100 MG tablet, Take 100 mg by  mouth daily., Disp: , Rfl:  .  vitamin B-12 (CYANOCOBALAMIN) 1000 MCG tablet, Take 1,000 mcg by mouth daily., Disp: , Rfl:  .  vitamin C (ASCORBIC ACID) 500 MG tablet, Take 500 mg by mouth daily., Disp: , Rfl:   Physical exam:  Vitals:   07/31/17 1129  BP: 135/70  Pulse: 71  Resp: 18  Temp: (!) 97.1 F (36.2 C)  TempSrc: Tympanic  Weight: 207 lb 14.4 oz (94.3 kg)   Physical Exam  Constitutional: He is oriented to person, place, and time and well-developed, well-nourished, and in no distress.  HENT:  Head: Normocephalic and atraumatic.  Eyes: EOM are normal. Pupils are equal, round, and reactive to light.  Neck: Normal range of motion.  Cardiovascular: Normal rate, regular rhythm and normal heart sounds.  Pulmonary/Chest: Effort normal and breath sounds normal.  Abdominal: Soft. Bowel sounds are normal.  Laparoscopy scars of right radical nephrectomy have healed well.  Musculoskeletal: He exhibits edema (Trace bilateral).  Neurological: He is alert and oriented to person, place, and time.  Skin: Skin is warm and dry.     CMP Latest Ref Rng & Units 07/30/2017  Glucose 65 - 99 mg/dL 128(H)  BUN 6 - 20 mg/dL 65(H)  Creatinine 0.61 - 1.24 mg/dL 3.26(H)  Sodium 135 - 145 mmol/L 136  Potassium 3.5 - 5.1 mmol/L 4.3  Chloride 101 - 111  mmol/L 103  CO2 22 - 32 mmol/L 27  Calcium 8.9 - 10.3 mg/dL 9.2  Total Protein 6.5 - 8.1 g/dL 8.9(H)  Total Bilirubin 0.3 - 1.2 mg/dL 0.5  Alkaline Phos 38 - 126 U/L 120  AST 15 - 41 U/L 57(H)  ALT 17 - 63 U/L 92(H)   CBC Latest Ref Rng & Units 07/30/2017  WBC 3.8 - 10.6 K/uL 4.1  Hemoglobin 13.0 - 18.0 g/dL 7.5(L)  Hematocrit 40.0 - 52.0 % 22.9(L)  Platelets 150 - 440 K/uL 131(L)   Pathology report from right radical nephrectomy on 05/27/2017 showed:A: Lymph node, precaval, lymphadenectomy  Fibroadipose soft tissue, no carcinoma identified No lymph node for evaluation  B: Kidney, right, radical nephrectomy  Superior mass: Renal cell carcinoma, papillary type 1, ISUP grade 2, see synoptic report below Inferior mass: Renal cell carcinoma, papillary type 2, ISUP grade 2    Assessment and plan- Patient is a 71 y.o. male who sees me for following medical issues:  1.  Smoldering multiple myeloma: Myeloma labs that were done yesterday are currently pending.  Since patient's last visit he has undergone a right radical nephrectomy which was complicated by worsening kidney functions and his creatinine is increased from a baseline of 1.3-3.2.  Also his anemia is significantly worse from a baseline of 10-7.5 today.  This is likely due to acute events and not from progression of his existing smoldering multiple myeloma to overt multiple myeloma.  I have spoken to Dr. Elvera Maria from [facility] pathology to take a relook at his right kidney specimen and see if he is seeing any changes of multiple myeloma that would change management at this time.  I will continue to observe him and see which way his myeloma labs go as well as his kidney functions go in the future.  I will not be repeating a bone marrow biopsy at this time  2.  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  He did have evidence of iron deficiency in the past and we gave him 2 doses of Feraheme and recent  iron  studies that were done yesterday were consistent with anemia of chronic disease.Marland Kitchen  His anemia has been acutely worse over the last 3 months after his right radical nephrectomy which I suspect is due to anemia of acute on chronic kidney disease and not from myeloma.  Given that he has symptomatic fatigue I will proceed with 1 unit of blood transfusion tomorrow.  I will also start him on weekly Procrit 10,000 units for anemia of chronic kidney disease with a goal to keep his hemoglobin between 10-11.  Hold Procrit if his hemoglobin is more than 10.  I will discuss Procrit with the patient when he comes for his blood transfusion tomorrow.  3.  Mediastinal adenopathy: There is no evidence of pathologically enlarged lymph nodes at the time of ebus and subsequently a mediastinal adenopathy was not biopsied.  Patient has significant acute issues going on with his chronic kidney disease and anemia presently.  I will consider doing a CT chest without contrast in 6 months time  I will see him back in 3 months with CBC, CMP, myeloma panel serum free light chains.  I will be checking a 24-hour urine protein electrophoresis specimen at this time     Visit Diagnosis 1. Symptomatic anemia   2. Smoldering myeloma ([place])   3. Anemia of chronic renal failure, unspecified CKD stage   4. Mediastinal adenopathy      Dr. Randa Evens, MD, MPH [facility] Pager- [phone] 07/31/2017 1:38 PM   Addendum: got a call from [facility] pathology. Dr. Elvera Maria did not note any plasma cells in his right kidney that would be suggestive of myeloma

## 2017-07-31 NOTE — Progress Notes (Signed)
Patient had labs yesterday and is in need of a blood transfusion.  Going to see MD today and return tomorrow for transfusion.  Has been feeling weak with exertional SOBr. but on 05/22/17 he had a nephrectomy and states he has been trough a lot so he doesn't quite feel like himself since.

## 2017-07-31 NOTE — Telephone Encounter (Signed)
Called pt to let him know that hgb dropped and creat inc. Pt states that he only has 1 kidney now. He had nephrectomy 9/26 and went back in hosp. X 2 for infection. She wants him to come in today and get blood work and get 1 unit of blood tom.  In order to get blood we have to get type and screen to get his blood prepared. He is agreeable to coming in 11 am labs and 11:15 to see md

## 2017-07-31 NOTE — Addendum Note (Signed)
Addended by: Luella Cook on: 07/31/2017 05:38 PM   Modules accepted: Orders

## 2017-07-31 NOTE — Telephone Encounter (Signed)
No orders per 07/31/17 los.  OK to check out , per Sherry/verbal.

## 2017-08-01 ENCOUNTER — Inpatient Hospital Stay: Payer: Medicare Other

## 2017-08-01 ENCOUNTER — Other Ambulatory Visit: Payer: Self-pay | Admitting: Oncology

## 2017-08-01 DIAGNOSIS — D649 Anemia, unspecified: Secondary | ICD-10-CM

## 2017-08-01 DIAGNOSIS — C9000 Multiple myeloma not having achieved remission: Secondary | ICD-10-CM

## 2017-08-01 DIAGNOSIS — D472 Monoclonal gammopathy: Secondary | ICD-10-CM

## 2017-08-01 LAB — MULTIPLE MYELOMA PANEL, SERUM
ALBUMIN SERPL ELPH-MCNC: 3.7 g/dL (ref 2.9–4.4)
ALPHA2 GLOB SERPL ELPH-MCNC: 0.7 g/dL (ref 0.4–1.0)
Albumin/Glob SerPl: 0.8 (ref 0.7–1.7)
Alpha 1: 0.2 g/dL (ref 0.0–0.4)
B-GLOBULIN SERPL ELPH-MCNC: 0.7 g/dL (ref 0.7–1.3)
GAMMA GLOB SERPL ELPH-MCNC: 3.2 g/dL — AB (ref 0.4–1.8)
GLOBULIN, TOTAL: 4.8 g/dL — AB (ref 2.2–3.9)
IgA: 20 mg/dL — ABNORMAL LOW (ref 61–437)
IgG (Immunoglobin G), Serum: 4053 mg/dL — ABNORMAL HIGH (ref 700–1600)
IgM (Immunoglobulin M), Srm: 10 mg/dL — ABNORMAL LOW (ref 15–143)
M PROTEIN SERPL ELPH-MCNC: 3.1 g/dL — AB
Total Protein ELP: 8.5 g/dL (ref 6.0–8.5)

## 2017-08-01 MED ORDER — SODIUM CHLORIDE 0.9 % IV SOLN
250.0000 mL | Freq: Once | INTRAVENOUS | Status: AC
  Administered 2017-08-01: 250 mL via INTRAVENOUS
  Filled 2017-08-01: qty 250

## 2017-08-01 MED ORDER — ACETAMINOPHEN 325 MG PO TABS
650.0000 mg | ORAL_TABLET | Freq: Once | ORAL | Status: AC
  Administered 2017-08-01: 650 mg via ORAL
  Filled 2017-08-01: qty 2

## 2017-08-02 LAB — BPAM RBC
BLOOD PRODUCT EXPIRATION DATE: 201812272359
ISSUE DATE / TIME: 201812071037
Unit Type and Rh: 5100

## 2017-08-02 LAB — TYPE AND SCREEN
ABO/RH(D): O POS
ANTIBODY SCREEN: NEGATIVE
UNIT DIVISION: 0

## 2017-08-05 ENCOUNTER — Ambulatory Visit: Payer: Medicare Other | Admitting: Oncology

## 2017-08-06 ENCOUNTER — Telehealth: Payer: Self-pay | Admitting: *Deleted

## 2017-08-06 DIAGNOSIS — C9000 Multiple myeloma not having achieved remission: Secondary | ICD-10-CM | POA: Diagnosis not present

## 2017-08-06 NOTE — Telephone Encounter (Signed)
Called and spoke to staff and the pathologist is busy with procedures today, she will give him my number. Dr. Janese Banks would like the pathologist to review the kidney specimen and see if there are any m protein in it.  She had previously spoke to pathologist and did say that he did not see any myeloma in the specimen.

## 2017-08-07 ENCOUNTER — Other Ambulatory Visit: Payer: Self-pay

## 2017-08-07 DIAGNOSIS — C9000 Multiple myeloma not having achieved remission: Secondary | ICD-10-CM

## 2017-08-07 DIAGNOSIS — N189 Chronic kidney disease, unspecified: Principal | ICD-10-CM

## 2017-08-07 DIAGNOSIS — D631 Anemia in chronic kidney disease: Secondary | ICD-10-CM

## 2017-08-08 ENCOUNTER — Inpatient Hospital Stay: Payer: Medicare Other

## 2017-08-08 ENCOUNTER — Other Ambulatory Visit: Payer: Self-pay | Admitting: *Deleted

## 2017-08-08 VITALS — BP 161/79 | HR 62

## 2017-08-08 DIAGNOSIS — C9000 Multiple myeloma not having achieved remission: Secondary | ICD-10-CM | POA: Diagnosis not present

## 2017-08-08 DIAGNOSIS — N189 Chronic kidney disease, unspecified: Secondary | ICD-10-CM

## 2017-08-08 DIAGNOSIS — D631 Anemia in chronic kidney disease: Secondary | ICD-10-CM

## 2017-08-08 DIAGNOSIS — D509 Iron deficiency anemia, unspecified: Secondary | ICD-10-CM

## 2017-08-08 LAB — IFE+PROTEIN ELECTRO, 24-HR UR
% BETA, Urine: 10.7 %
ALPHA 1 URINE: 3.2 %
ALPHA 2 UR: 4.3 %
Albumin, U: 42 %
GAMMA GLOBULIN URINE: 39.8 %
M-SPIKE %, URINE: 29.3 % — AB
M-SPIKE, MG/24 HR: 701 mg/(24.h) — AB
TOTAL PROTEIN, URINE-UPE24: 119.7 mg/dL
TOTAL PROTEIN, URINE-UR/DAY: 2394 mg/(24.h) — AB (ref 30–150)
TOTAL VOLUME: 2000

## 2017-08-08 LAB — HEMOGLOBIN: HEMOGLOBIN: 8.2 g/dL — AB (ref 13.0–18.0)

## 2017-08-08 MED ORDER — EPOETIN ALFA 10000 UNIT/ML IJ SOLN
10000.0000 [IU] | Freq: Once | INTRAMUSCULAR | Status: AC
  Administered 2017-08-08: 10000 [IU] via SUBCUTANEOUS
  Filled 2017-08-08: qty 2

## 2017-08-08 NOTE — Progress Notes (Signed)
OK to proceed with procrit today with bp 161/79

## 2017-08-10 ENCOUNTER — Other Ambulatory Visit: Payer: Self-pay | Admitting: *Deleted

## 2017-08-10 DIAGNOSIS — D472 Monoclonal gammopathy: Secondary | ICD-10-CM

## 2017-08-10 DIAGNOSIS — C9000 Multiple myeloma not having achieved remission: Secondary | ICD-10-CM

## 2017-08-11 ENCOUNTER — Inpatient Hospital Stay: Payer: Medicare Other

## 2017-08-11 ENCOUNTER — Telehealth: Payer: Self-pay | Admitting: Oncology

## 2017-08-11 NOTE — Telephone Encounter (Signed)
Add MD appt to pre existing Lab & Inj appts on 08/10/17, per schd msg/Sherry. Appts conf with patient.

## 2017-08-12 ENCOUNTER — Other Ambulatory Visit: Payer: Self-pay | Admitting: *Deleted

## 2017-08-12 ENCOUNTER — Telehealth: Payer: Self-pay | Admitting: *Deleted

## 2017-08-12 NOTE — Telephone Encounter (Signed)
Spoke to patient via telephone about his bone marrow biopsy which is scheduled for 12/26 at 8:30; explained that patient is to arrive at the [facility] at 7:30; NPO after midnight the night before; meds with a sip of water the morning of the procedure; he will need a driver to take him home afterwards. Patient verbalized understanding.       dhs

## 2017-08-13 ENCOUNTER — Inpatient Hospital Stay: Payer: Medicare Other

## 2017-08-14 ENCOUNTER — Inpatient Hospital Stay: Payer: Medicare Other

## 2017-08-14 ENCOUNTER — Other Ambulatory Visit: Payer: Self-pay | Admitting: *Deleted

## 2017-08-14 DIAGNOSIS — D509 Iron deficiency anemia, unspecified: Secondary | ICD-10-CM

## 2017-08-15 ENCOUNTER — Telehealth: Payer: Self-pay | Admitting: Pharmacist

## 2017-08-15 ENCOUNTER — Inpatient Hospital Stay: Payer: Medicare Other | Admitting: *Deleted

## 2017-08-15 ENCOUNTER — Inpatient Hospital Stay (HOSPITAL_BASED_OUTPATIENT_CLINIC_OR_DEPARTMENT_OTHER): Payer: Medicare Other | Admitting: Oncology

## 2017-08-15 ENCOUNTER — Encounter: Payer: Self-pay | Admitting: Oncology

## 2017-08-15 ENCOUNTER — Telehealth: Payer: Self-pay | Admitting: *Deleted

## 2017-08-15 ENCOUNTER — Inpatient Hospital Stay: Payer: Medicare Other

## 2017-08-15 ENCOUNTER — Other Ambulatory Visit: Payer: Self-pay | Admitting: *Deleted

## 2017-08-15 VITALS — BP 162/74 | HR 90 | Temp 97.4°F | Resp 18 | Wt 206.0 lb

## 2017-08-15 DIAGNOSIS — Z79899 Other long term (current) drug therapy: Secondary | ICD-10-CM

## 2017-08-15 DIAGNOSIS — R590 Localized enlarged lymph nodes: Secondary | ICD-10-CM

## 2017-08-15 DIAGNOSIS — D509 Iron deficiency anemia, unspecified: Secondary | ICD-10-CM | POA: Diagnosis not present

## 2017-08-15 DIAGNOSIS — E119 Type 2 diabetes mellitus without complications: Secondary | ICD-10-CM

## 2017-08-15 DIAGNOSIS — R5383 Other fatigue: Secondary | ICD-10-CM

## 2017-08-15 DIAGNOSIS — D563 Thalassemia minor: Secondary | ICD-10-CM

## 2017-08-15 DIAGNOSIS — Z7982 Long term (current) use of aspirin: Secondary | ICD-10-CM

## 2017-08-15 DIAGNOSIS — I129 Hypertensive chronic kidney disease with stage 1 through stage 4 chronic kidney disease, or unspecified chronic kidney disease: Secondary | ICD-10-CM | POA: Diagnosis not present

## 2017-08-15 DIAGNOSIS — M7989 Other specified soft tissue disorders: Secondary | ICD-10-CM

## 2017-08-15 DIAGNOSIS — F17210 Nicotine dependence, cigarettes, uncomplicated: Secondary | ICD-10-CM

## 2017-08-15 DIAGNOSIS — C9000 Multiple myeloma not having achieved remission: Secondary | ICD-10-CM | POA: Diagnosis not present

## 2017-08-15 DIAGNOSIS — Z95810 Presence of automatic (implantable) cardiac defibrillator: Secondary | ICD-10-CM

## 2017-08-15 DIAGNOSIS — N189 Chronic kidney disease, unspecified: Secondary | ICD-10-CM

## 2017-08-15 DIAGNOSIS — I509 Heart failure, unspecified: Secondary | ICD-10-CM

## 2017-08-15 DIAGNOSIS — R011 Cardiac murmur, unspecified: Secondary | ICD-10-CM

## 2017-08-15 DIAGNOSIS — G629 Polyneuropathy, unspecified: Secondary | ICD-10-CM

## 2017-08-15 DIAGNOSIS — Z7189 Other specified counseling: Secondary | ICD-10-CM

## 2017-08-15 LAB — BASIC METABOLIC PANEL
Anion gap: 7 (ref 5–15)
BUN: 65 mg/dL — ABNORMAL HIGH (ref 6–20)
CALCIUM: 9.4 mg/dL (ref 8.9–10.3)
CO2: 25 mmol/L (ref 22–32)
CREATININE: 3.12 mg/dL — AB (ref 0.61–1.24)
Chloride: 103 mmol/L (ref 101–111)
GFR calc non Af Amer: 18 mL/min — ABNORMAL LOW (ref >60)
GFR, EST AFRICAN AMERICAN: 21 mL/min — AB (ref >60)
Glucose, Bld: 60 mg/dL — ABNORMAL LOW (ref 65–99)
Potassium: 4.2 mmol/L (ref 3.5–5.1)
Sodium: 135 mmol/L (ref 135–145)

## 2017-08-15 LAB — HEMOGLOBIN: HEMOGLOBIN: 8.2 g/dL — AB (ref 13.0–18.0)

## 2017-08-15 MED ORDER — LENALIDOMIDE 15 MG PO CAPS: 15.0000 mg | ORAL_CAPSULE | ORAL | 4 refills | Status: DC

## 2017-08-15 MED ORDER — OXYCODONE HCL 5 MG PO TABS: 5.0000 mg | ORAL_TABLET | Freq: Four times a day (QID) | ORAL | 0 refills | Status: DC | PRN

## 2017-08-15 MED ORDER — ACYCLOVIR 400 MG PO TABS: 400.0000 mg | ORAL_TABLET | Freq: Every day | ORAL | 5 refills | Status: DC

## 2017-08-15 NOTE — Telephone Encounter (Signed)
Oral Oncology Pharmacist Encounter  Received new prescription for Revlimid (lenalidomide) for the treatment of multiple myeloma in conjunction with dexamethasone and Velcade.  CBC and CMP from 07/30/17 assessed, SCr is elevated and Revlimid dose is being adjusted to reflect this. Prescription dose and frequency assessed.   Current medication list in Epic reviewed, no DDIs with Revlimid identified.  Prescription has been e-scribed to the [facility] for benefits analysis and approval.  Patient education Counseled patient and his wife following their MD visit on administration, dosing, side effects, monitoring, drug-food interactions, safe handling, storage, and disposal. Patient will take 49m by mouth every other day on day 1-14.  Side effects include but not limited to: rash, N/V/D, constipation, decrease WBC/hgb/plt.    Reviewed with patient importance of keeping a medication schedule and plan for any missed doses.  Also reviewed the use of aspirin and acyclovir.  Adam Wall voiced understanding and appreciation. All questions answered.  Provided them with a medication handout and oral chemo education binder. Will make a calendar for them to use we he starts his RVD.  Oral Oncology Clinic will continue to follow for insurance authorization, copayment issues, and start date.  [facility]. Patient knows to call the office with questions or concerns. Oral Chemotherapy Navigation Clinic will continue to follow.  ADarl Pikes PharmD, BCPS Hematology/Oncology Clinical Pharmacist [phone] 08/15/2017 4:01 PM

## 2017-08-15 NOTE — Telephone Encounter (Signed)
Patient to start Velcade and Revlimid together August 22, 2017.  Due to the holidays we are unable to accommodate chemotherapy teaching for Velcade in the standard chemotherapy class.  I spoke to [employer], and she is willing to allow me to teach the side effects of the Velcade as well as offering the patient the standard chemotherapy class the next following week.  I have discussed the Velcade with patient and wife.  The side effects of Velcade and up to 30% of patients may experience fatigue nausea and vomiting, diarrhea, constipation, low platelet count, fever, low red blood count.  And people less than 30% can experience headache, joint pain ,swelling of the face hands feet or legs, low white blood count, rash, muscle cramps, low blood pressure.  These are just some of the side effects that  Patient ca experience.  Patient was given information about when to contact the doctor based on the side effects that they may experience  And self-care tips.  Patient and wife had no additional questions.  Patient was giving a side effects list from Velcade through chemo cares to go home and read and if they have any additional questions they are welcome to call.  The patient was given the ability if he would like to take the chemo class that is offered but it would be the week of January 1 patient will think about it he has another appointment at [facility] neurology department.  He will think about it and if he wants to take the full chemo class he will at a later date.  Patient also knows that if we do not get the Revlimid and Velcade going in on December 28 for the first appointment r/s to alater date

## 2017-08-15 NOTE — Telephone Encounter (Signed)
Called wife and let her know that Dr. Janese Banks wanted to send in pain med and it is oxycodone instead of the hydrocodone because hydrocodone has tylenol in it and he can still take tylenol and use pain alternating but she did not want him to have too much tylenol so she chose oxycodone to take for pain and it does not have tylenol in it.  She will let husband know. It is already at pharmacy

## 2017-08-18 ENCOUNTER — Other Ambulatory Visit: Payer: Self-pay | Admitting: *Deleted

## 2017-08-18 ENCOUNTER — Other Ambulatory Visit: Payer: Self-pay | Admitting: Radiology

## 2017-08-18 DIAGNOSIS — Z7189 Other specified counseling: Secondary | ICD-10-CM | POA: Insufficient documentation

## 2017-08-18 DIAGNOSIS — C9000 Multiple myeloma not having achieved remission: Secondary | ICD-10-CM | POA: Insufficient documentation

## 2017-08-18 MED ORDER — LENALIDOMIDE 15 MG PO CAPS: 15.0000 mg | ORAL_CAPSULE | ORAL | 4 refills | Status: DC

## 2017-08-18 MED ORDER — DEXAMETHASONE 4 MG PO TABS: ORAL_TABLET | ORAL | 3 refills | Status: DC

## 2017-08-18 MED ORDER — ACYCLOVIR 400 MG PO TABS: 400.0000 mg | ORAL_TABLET | Freq: Two times a day (BID) | ORAL | 3 refills | Status: DC

## 2017-08-18 MED ORDER — PROCHLORPERAZINE MALEATE 10 MG PO TABS: 10.0000 mg | ORAL_TABLET | Freq: Four times a day (QID) | ORAL | 1 refills | Status: DC | PRN

## 2017-08-18 MED ORDER — LENALIDOMIDE 25 MG PO CAPS: ORAL_CAPSULE | ORAL | 3 refills | Status: DC

## 2017-08-18 MED ORDER — ONDANSETRON HCL 8 MG PO TABS: 8.0000 mg | ORAL_TABLET | Freq: Two times a day (BID) | ORAL | 1 refills | Status: DC | PRN

## 2017-08-18 MED ORDER — ACYCLOVIR 400 MG PO TABS: 400.0000 mg | ORAL_TABLET | Freq: Every day | ORAL | 5 refills | Status: DC

## 2017-08-18 MED ORDER — LORAZEPAM 0.5 MG PO TABS: 0.5000 mg | ORAL_TABLET | Freq: Four times a day (QID) | ORAL | 0 refills | Status: DC | PRN

## 2017-08-18 NOTE — Progress Notes (Signed)
Hematology/Oncology Consult note [facility]  Telephone:([phone] [phone]  Patient Care Team: [name], MD as PCP - General (Pediatrics)   Name of the patient: Adam Wall  [id number]  1945/08/30   Date of visit: 08/18/17  Diagnosis- 1. IgG kappa multiple  myeloma 2. RCC s/p right nephrectomy 3. Mediastinal adenopathy of uncertain etiology 4. Iron deficiency anemia    Chief complaint/ Reason for visit- discuss bloodwork results  Heme/Onc history: patient is a 71 year old male with a past medical history significant for congestive heart failure with an ICD in place, hypertension and diabetes among other comorbidities. Recent SPEP on 09/19/2016 revealed a monoclonal M spike of 1.9 g of IgG kappa. Recent CBC showed white count of 5.5, H&H of 10.1/31.9 with an MCV of 69 of note patient has always been anemic with a hemoglobin between 9.6-10.7 over the last 2 years. Also his MCV has always been between 68-70 inthe past. Iron studies from 06/13/2016 revealed ferritin of 18 and a low TIBC of 224. 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 CMP has always revealed normal kidney function and normal calcium level.  Results of blood work from 10/04/2016 breast follows: CBC showed white count of 4.7, H&H of 10/31.4 with an MCV of 67.8 and a platelet count of 195. CMP was within normal limits with albumin of 3.3 and a calcium of 9.2. Beta 2 microglobulin was mildly elevated at 3. and LDH was mildly low at 90. Ferritin was low at 22 and iron studies showed a low serum iron of 39 and a TIBC low at 230. 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 B12 level was normal at 331.  Patient's chronic anemia has been attributed to iron deficiency and thalassemia minor causing microcytosis  Quantitative immunoglobulins revealed an elevated IgG of 2875 and IgG kappa monoclonal M spike of 2.3 g. kappa lambda free light chain ratio was abnormal at  14.45  Bone marrow biopsy showed hypercellular bone marrow with 36% atypical plasma cells. Cytogenetics and FISH studies were normal  Skeletal survey showed no definitive evidence of lytic destruction or lesions inthe skeleton.  PET scan showed no lytic lesions.  Right renal mass was noted as well as FDG avid mediastinal adenopathy. 24 hour urine protein showed 610 mg and M spike of 66 mg  Patient was referred to pulmonary for a EBUS for mediastinal adenopathy.  At the time of procedure the lymph nodes were noted to be small and shotty and procedure was abandoned due to risk of bleeding.  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  Biopsy was complicated by bleeding complications for which patient was admitted.  He subsequently underwent laparoscopic right radical nephrectomy in September 2018.  Post surgery patient had worsening renal functions.  His postoperative course was complicated by bacteremia and urinary tract infection for which she was again hospitalized.  Creatinine at that time was 3.5.  Also his hemoglobin went down from a baseline of 10 down to 7.2.  He did receive blood transfusion sometime in October 2018.  He is also being followed by [facility] nephrology.   Myeloma labs from from 07/30/2017 showed elevated IgG of 4053 up from 2797.  M protein went up from 2.2-3.1 g.  Free light chains were elevated at 1030 from 271 from 6 months prior.  Kappa lambda light chain ratio elevated at 48.37 from a prior value of 18.71.  Interval history- feels fatigued. Complains of generalized body aches affecting his ambulation.   ECOG PS- 1 Pain scale- 8 Opioid associated constipation- no  Review of systems- Review of Systems  Constitutional: Positive for malaise/fatigue. Negative for chills, fever and weight loss.  HENT: Negative for congestion, ear discharge and nosebleeds.   Eyes: Negative for blurred vision.    Respiratory: Negative for cough, hemoptysis, sputum production, shortness of breath and wheezing.   Cardiovascular: Negative for chest pain, palpitations, orthopnea and claudication.  Gastrointestinal: Negative for abdominal pain, blood in stool, constipation, diarrhea, heartburn, melena, nausea and vomiting.  Genitourinary: Negative for dysuria, flank pain, frequency, hematuria and urgency.  Musculoskeletal: Positive for joint pain. Negative for back pain and myalgias.  Skin: Negative for rash.  Neurological: Positive for weakness. Negative for dizziness, tingling, focal weakness, seizures and headaches.  Endo/Heme/Allergies: Does not bruise/bleed easily.  Psychiatric/Behavioral: Negative for depression and suicidal ideas. The patient does not have insomnia.        No Known Allergies   Past Medical History:  Diagnosis Date  . AICD (automatic cardioverter/defibrillator) present   . Anemia   . Blockage of coronary artery of heart ([place])   . CHF (congestive heart failure) ([place])   . Diabetes mellitus without complication ([place])   . Dysrhythmia   . Heart murmur   . Hypertension   . Hypochloremia   . LBBB (left bundle branch block)   . Neuropathy   . Right renal mass   . 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 CARDIAC SURGERY    . ENDOBRONCHIAL ULTRASOUND N/A 04/03/2017   Procedure: ENDOBRONCHIAL ULTRASOUND;  Surgeon: Flora Lipps, MD;  Location: [facility] ORS;  Service: Cardiopulmonary;  Laterality: N/A;  . Heart Bypass    . PACEMAKER PLACEMENT     ICD    Social History   Socioeconomic History  . Marital status: Married    Spouse name: Not on file  . Number of children: Not on file  . Years of education: Not on file  . Highest education level: Not on file  Social Needs  . Financial resource strain: Not on file  . Food insecurity - worry: Not on file  . Food insecurity - inability: Not on file  . Transportation needs - medical:  Not on file  . Transportation needs - non-medical: Not on file  Occupational History  . Not on file  Tobacco Use  . Smoking status: Former Smoker    Years: 10.00    Types: Cigars    Last attempt to quit: 10/04/1993    Years since quitting: 23.8  . Smokeless tobacco: Never Used  . Tobacco comment: 3 CIGARS PER DAY  Substance and Sexual Activity  . Alcohol use: No  . Drug use: No  . Sexual activity: Not on file  Other Topics Concern  . Not on file  Social History Narrative  . Not on file    Family History  Problem Relation Age of Onset  . Heart failure Paternal Grandmother      Current Outpatient Medications:  .  acetaminophen (TYLENOL) 500 MG tablet, Take 1,000 mg by mouth every 8 (eight) hours as needed for mild pain or moderate pain., Disp: , Rfl:  .  aspirin 81 MG tablet, Take 81 mg by mouth daily., Disp: , Rfl:  .  atorvastatin (LIPITOR) 40 MG tablet, Take 40 mg by mouth at bedtime., Disp: , Rfl:  .  carvedilol (COREG) 25  MG tablet, Take 25 mg by mouth 2 (two) times daily with a meal., Disp: , Rfl:  .  furosemide (LASIX) 40 MG tablet, Take 20 mg by mouth daily., Disp: , Rfl:  .  gabapentin (NEURONTIN) 100 MG capsule, Take 100 mg by mouth daily as needed. , Disp: , Rfl:  .  hydrALAZINE (APRESOLINE) 25 MG tablet, Take 25 mg by mouth 3 (three) times daily., Disp: , Rfl:  .  insulin NPH-regular Human (NOVOLIN 70/30) (70-30) 100 UNIT/ML injection, Inject 16 Units into the skin., Disp: , Rfl:  .  isosorbide dinitrate (ISORDIL) 10 MG tablet, Take 10 mg by mouth 3 (three) times daily., Disp: , Rfl:  .  metFORMIN (GLUCOPHAGE) 1000 MG tablet, Take 1,000-1,500 mg by mouth 2 (two) times daily with a meal. Takes 1500 mg in the morning and 1000 mg in the evening, Disp: , Rfl:  .  sacubitril-valsartan (ENTRESTO) 49-51 MG, Take 1 tablet by mouth 2 (two) times daily., Disp: , Rfl:  .  sitaGLIPtin (JANUVIA) 100 MG tablet, Take 100 mg by mouth daily., Disp: , Rfl:  .  tamsulosin (FLOMAX) 0.4  MG CAPS capsule, Take 0.4 mg by mouth daily., Disp: , Rfl:  .  vitamin B-12 (CYANOCOBALAMIN) 1000 MCG tablet, Take 1,000 mcg by mouth daily., Disp: , Rfl:  .  vitamin C (ASCORBIC ACID) 500 MG tablet, Take 500 mg by mouth daily., Disp: , Rfl:  .  acyclovir (ZOVIRAX) 400 MG tablet, Take 1 tablet (400 mg total) by mouth 5 (five) times daily., Disp: 30 tablet, Rfl: 5 .  lenalidomide (REVLIMID) 15 MG capsule, Take 1 capsule (15 mg total) by mouth every other day., Disp: 15 capsule, Rfl: 4 .  oxyCODONE (OXY IR/ROXICODONE) 5 MG immediate release tablet, Take 1 tablet (5 mg total) by mouth every 6 (six) hours as needed for severe pain., Disp: 60 tablet, Rfl: 0  Physical exam:  Vitals:   08/15/17 1433  BP: (!) 162/74  Pulse: 90  Resp: 18  Temp: (!) 97.4 F (36.3 C)  TempSrc: Tympanic  Weight: 206 lb (93.4 kg)   Physical Exam  Constitutional: He is oriented to person, place, and time and well-developed, well-nourished, and in no distress.  HENT:  Head: Normocephalic and atraumatic.  Eyes: EOM are normal. Pupils are equal, round, and reactive to light.  Neck: Normal range of motion.  Cardiovascular: Normal rate, regular rhythm and normal heart sounds.  Pulmonary/Chest: Effort normal and breath sounds normal.  Abdominal: Soft. Bowel sounds are normal.  Musculoskeletal: He exhibits edema (trace).  Neurological: He is alert and oriented to person, place, and time.  Skin: Skin is warm and dry.     CMP Latest Ref Rng & Units 08/15/2017  Glucose 65 - 99 mg/dL 60(L)  BUN 6 - 20 mg/dL 65(H)  Creatinine 0.61 - 1.24 mg/dL 3.12(H)  Sodium 135 - 145 mmol/L 135  Potassium 3.5 - 5.1 mmol/L 4.2  Chloride 101 - 111 mmol/L 103  CO2 22 - 32 mmol/L 25  Calcium 8.9 - 10.3 mg/dL 9.4  Total Protein 6.5 - 8.1 g/dL -  Total Bilirubin 0.3 - 1.2 mg/dL -  Alkaline Phos 38 - 126 U/L -  AST 15 - 41 U/L -  ALT 17 - 63 U/L -   CBC Latest Ref Rng & Units 08/15/2017  WBC 3.8 - 10.6 K/uL -  Hemoglobin 13.0 -  18.0 g/dL 8.2(L)  Hematocrit 40.0 - 52.0 % -  Platelets 150 - 440 K/uL -  Assessment and plan- Patient is a 71 y.o. male who sees me for following issues:  1. Multiple myeloma-based on previous assessments 6 months ago patient was thought to have smoldering multiple myeloma.  At that time his creatinine was 1.3.  He had about 36% plasma cells in his bone marrow.  He had evidence of thalassemia trait and iron deficiency anemia which was contributing to his anemia.  His hemoglobin had been stable around 10 for the past few years and hence he did not meet any crab criteria for multiple myeloma.  Patient was supposed to follow-up with me in 3 months time but that got delayed because of multiple complications following his right nephrectomy.  Repeat labs at this time shows that his creatinine has now worsened from 1.3-3.2.  He is also significantly anemic from a baseline of 10 down to a 7.2.  All this happened right after his nephrectomy and was thought to be 1 of the possible reasons for the decline.  However his multiple myeloma labs reveal an increased M protein as well as increased serum free light chain ratio as compared to 6 months ago which is highly concerning for progression and I would like to treat him as such.  I will repeat a bone marrow biopsy at this time.  I will also repeat a PET scan given his diffuse body aches to rule out any lytic lesions.  I recommend induction chemotherapy with RVD for 6 months.  Given his CKD I will dose reduce Revlimid to 15 mg every 48 hours 2 weeks on and one-week off.  I will plan to give him Velcade at 1.3 mg/m square day 1 12/01/2009 2 weeks on 1 week off along with Decadron on days he receives his Velcade. Treatment will be given with a palliative intent.  Discussed risks and benefits of Revlimid including all but not limited to fatigue, skin rash, diarrhea, low blood counts and risk of DVT and PE.  Patient will be on aspirin for thromboprophylaxis.  Risk of  Velcade including all but not limited to low blood counts and peripheral neuropathy.  There is also a risk of shingles reactivation on Velcade and patient needs to be on zoster prophylaxis.  I will start him on acyclovir 400 mg once a day for the same.  Patient understands and agrees to proceed as planned   I will refer him to Dr. Amalia Hailey from [facility] for second opinion as well as consideration for bone marrow transplant down the line.  Prior to his nephrectomy patient had a good performance status but recently his performance status has declined and given his recent RCC I doubt that he would be a bone marrow transplant candidate down the line.  I will hold off on giving him Procrit for his anemia as I think that his anemia is likely worse because of progression of his myeloma and I will treat him with RVD to see if his anemia improves.  I will see him back in 3 weeks time and start Xgeva at that point  2.  Mediastinal adenopathy: When patient went to get his EBUS done, he did not have any appreciable adenopathy.  We will reevaluate that with PET/CT scan as well.   3. RCC- s/p resection. No role for adjuvant treatment. Continue to monitor.  4. I will start him on oxycodone 5 mg PO Q4 prn for his pain and bodyaches.    Total face to face encounter time for this patient visit was 45 min. >50%  of the time was  spent in counseling and coordination of care.     Visit Diagnosis 1. Multiple myeloma not having achieved remission ([place])   2. Goals of care, counseling/discussion      Dr. Randa Evens, MD, MPH [facility] Pager- [phone] 08/18/2017 9:29 AM

## 2017-08-20 ENCOUNTER — Other Ambulatory Visit: Payer: Self-pay | Admitting: *Deleted

## 2017-08-20 ENCOUNTER — Other Ambulatory Visit (HOSPITAL_COMMUNITY)
Admission: RE | Admit: 2017-08-20 | Disposition: A | Discharge status: Home / Self Care | Payer: Medicare Other | Source: Ambulatory Visit | Attending: Oncology | Admitting: Oncology

## 2017-08-20 ENCOUNTER — Ambulatory Visit
Admission: RE | Admit: 2017-08-20 | Discharge: 2017-08-20 | Disposition: A | Discharge status: Home / Self Care | Payer: Medicare Other | Source: Ambulatory Visit | Attending: Oncology | Admitting: Oncology

## 2017-08-20 ENCOUNTER — Other Ambulatory Visit: Payer: Self-pay | Admitting: Pharmacist

## 2017-08-20 DIAGNOSIS — D472 Monoclonal gammopathy: Secondary | ICD-10-CM

## 2017-08-20 DIAGNOSIS — C9000 Multiple myeloma not having achieved remission: Secondary | ICD-10-CM | POA: Insufficient documentation

## 2017-08-20 DIAGNOSIS — D649 Anemia, unspecified: Secondary | ICD-10-CM | POA: Insufficient documentation

## 2017-08-20 DIAGNOSIS — D696 Thrombocytopenia, unspecified: Secondary | ICD-10-CM | POA: Insufficient documentation

## 2017-08-20 HISTORY — DX: Presence of cardiac pacemaker: Z95.0

## 2017-08-20 HISTORY — DX: Chronic kidney disease, unspecified: N18.9

## 2017-08-20 LAB — BASIC METABOLIC PANEL
Anion gap: 5 (ref 5–15)
BUN: 63 mg/dL — ABNORMAL HIGH (ref 6–20)
CALCIUM: 9.6 mg/dL (ref 8.9–10.3)
CO2: 26 mmol/L (ref 22–32)
CREATININE: 3.26 mg/dL — AB (ref 0.61–1.24)
Chloride: 103 mmol/L (ref 101–111)
GFR calc non Af Amer: 17 mL/min — ABNORMAL LOW (ref >60)
GFR, EST AFRICAN AMERICAN: 20 mL/min — AB (ref >60)
Glucose, Bld: 128 mg/dL — ABNORMAL HIGH (ref 65–99)
Potassium: 4.3 mmol/L (ref 3.5–5.1)
Sodium: 134 mmol/L — ABNORMAL LOW (ref 135–145)

## 2017-08-20 LAB — CBC WITH DIFFERENTIAL/PLATELET
BASOS PCT: 0 %
Basophils Absolute: 0 10*3/uL (ref 0–0.1)
EOS PCT: 4 %
Eosinophils Absolute: 0.1 10*3/uL (ref 0–0.7)
HEMATOCRIT: 26.6 % — AB (ref 40.0–52.0)
Hemoglobin: 8.6 g/dL — ABNORMAL LOW (ref 13.0–18.0)
Lymphocytes Relative: 18 %
Lymphs Abs: 0.8 10*3/uL — ABNORMAL LOW (ref 1.0–3.6)
MCH: 23.1 pg — ABNORMAL LOW (ref 26.0–34.0)
MCHC: 32.1 g/dL (ref 32.0–36.0)
MCV: 71.8 fL — ABNORMAL LOW (ref 80.0–100.0)
MONO ABS: 0.7 10*3/uL (ref 0.2–1.0)
MONOS PCT: 17 %
NEUTROS ABS: 2.6 10*3/uL (ref 1.4–6.5)
Neutrophils Relative %: 61 %
PLATELETS: 128 10*3/uL — AB (ref 150–440)
RBC: 3.71 MIL/uL — ABNORMAL LOW (ref 4.40–5.90)
RDW: 19.8 % — AB (ref 11.5–14.5)
WBC: 4.2 10*3/uL (ref 3.8–10.6)

## 2017-08-20 LAB — APTT: APTT: 31 s (ref 24–36)

## 2017-08-20 LAB — PROTIME-INR
INR: 1.11
PROTHROMBIN TIME: 14.2 s (ref 11.4–15.2)

## 2017-08-20 MED ORDER — FENTANYL CITRATE (PF) 100 MCG/2ML IJ SOLN
INTRAMUSCULAR | Status: AC | PRN
  Administered 2017-08-20: 50 ug via INTRAVENOUS
  Administered 2017-08-20: 25 ug via INTRAVENOUS

## 2017-08-20 MED ORDER — FENTANYL CITRATE (PF) 100 MCG/2ML IJ SOLN
INTRAMUSCULAR | Status: AC
Start: 2017-08-20 — End: 2017-08-20
  Filled 2017-08-20: qty 4

## 2017-08-20 MED ORDER — SODIUM CHLORIDE 0.9 % IV SOLN
INTRAVENOUS | Status: DC
  Administered 2017-08-20: 09:00:00 via INTRAVENOUS

## 2017-08-20 MED ORDER — HEPARIN SOD (PORK) LOCK FLUSH 100 UNIT/ML IV SOLN
INTRAVENOUS | Status: AC
  Filled 2017-08-20: qty 5

## 2017-08-20 MED ORDER — DEXAMETHASONE 4 MG PO TABS: ORAL_TABLET | ORAL | 3 refills | Status: DC

## 2017-08-20 MED ORDER — LIDOCAINE HCL (PF) 1 % IJ SOLN
INTRAMUSCULAR | Status: AC | PRN
  Administered 2017-08-20: 10 mL

## 2017-08-20 MED ORDER — MIDAZOLAM HCL 5 MG/5ML IJ SOLN
INTRAMUSCULAR | Status: AC | PRN
  Administered 2017-08-20 (×3): 1 mg via INTRAVENOUS

## 2017-08-20 MED ORDER — LENALIDOMIDE 15 MG PO CAPS
15.0000 mg | ORAL_CAPSULE | ORAL | 4 refills | Status: DC
Start: 2017-08-20 — End: 2017-09-10

## 2017-08-20 MED ORDER — HEPARIN SOD (PORK) LOCK FLUSH 100 UNIT/ML IV SOLN
INTRAVENOUS | Status: AC | PRN
  Administered 2017-08-20: 50 [IU] via INTRAVENOUS

## 2017-08-20 MED ORDER — MIDAZOLAM HCL 5 MG/5ML IJ SOLN
INTRAMUSCULAR | Status: AC
  Filled 2017-08-20: qty 5

## 2017-08-20 NOTE — Progress Notes (Signed)
Oral Chemotherapy Pharmacist Encounter  Rerouted dexamethasone prescription to [facility] so it could be dispensed with his Revlimid.  Called his local pharmacy, where the prescription was originally sent, and had the old prescription cancelled.  Darl Pikes, PharmD, BCPS Hematology/Oncology Clinical Pharmacist [facility] [phone]  08/20/2017 8:27 AM

## 2017-08-20 NOTE — Discharge Instructions (Signed)
Bone Marrow Aspiration and Bone Marrow Biopsy, Adult, Care After °This sheet gives you information about how to care for yourself after your procedure. Your health care provider may also give you more specific instructions. If you have problems or questions, contact your health care provider. °What can I expect after the procedure? °After the procedure, it is common to have: °· Mild pain and tenderness. °· Swelling. °· Bruising. ° °Follow these instructions at home: °· Take over-the-counter or prescription medicines only as told by your health care provider. °· Do not take baths, swim, or use a hot tub until your health care provider approves. Ask if you can take a shower or have a sponge bath. °· Follow instructions from your health care provider about how to take care of the puncture site. Make sure you: °? Wash your hands with soap and water before you change your bandage (dressing). If soap and water are not available, use hand sanitizer. °? Change your dressing as told by your health care provider. °· Check your puncture site every day for signs of infection. Check for: °? More redness, swelling, or pain. °? More fluid or blood. °? Warmth. °? Pus or a bad smell. °· Return to your normal activities as told by your health care provider. Ask your health care provider what activities are safe for you. °· Do not drive for 24 hours if you were given a medicine to help you relax (sedative). °· Keep all follow-up visits as told by your health care provider. This is important. °Contact a health care provider if: °· You have more redness, swelling, or pain around the puncture site. °· You have more fluid or blood coming from the puncture site. °· Your puncture site feels warm to the touch. °· You have pus or a bad smell coming from the puncture site. °· You have a fever. °· Your pain is not controlled with medicine. °This information is not intended to replace advice given to you by your health care provider. Make sure  you discuss any questions you have with your health care provider. °Document Released: 03/01/2005 Document Revised: 03/01/2016 Document Reviewed: 01/24/2016 °Elsevier Interactive Patient Education © 2018 [employer] Inc. ° °Moderate Conscious Sedation, Adult, Care After °These instructions provide you with information about caring for yourself after your procedure. Your health care provider may also give you more specific instructions. Your treatment has been planned according to current medical practices, but problems sometimes occur. Call your health care provider if you have any problems or questions after your procedure. °What can I expect after the procedure? °After your procedure, it is common: °· To feel sleepy for several hours. °· To feel clumsy and have poor balance for several hours. °· To have poor judgment for several hours. °· To vomit if you eat too soon. ° °Follow these instructions at home: °For at least 24 hours after the procedure: ° °· Do not: °? Participate in activities where you could fall or become injured. °? Drive. °? Use heavy machinery. °? Drink alcohol. °? Take sleeping pills or medicines that cause drowsiness. °? Make important decisions or sign legal documents. °? Take care of children on your own. °· Rest. °Eating and drinking °· Follow the diet recommended by your health care provider. °· If you vomit: °? Drink water, juice, or soup when you can drink without vomiting. °? Make sure you have little or no nausea before eating solid foods. °General instructions °· Have a responsible adult stay with you until you   are awake and alert.  Take over-the-counter and prescription medicines only as told by your health care provider.  If you smoke, do not smoke without supervision.  Keep all follow-up visits as told by your health care provider. This is important. Contact a health care provider if:  You keep feeling nauseous or you keep vomiting.  You feel light-headed.  You develop a  rash.  You have a fever. Get help right away if:  You have trouble breathing. This information is not intended to replace advice given to you by your health care provider. Make sure you discuss any questions you have with your health care provider. Document Released: 06/02/2013 Document Revised: 01/15/2016 Document Reviewed: 12/02/2015 Elsevier Interactive Patient Education  [employer].

## 2017-08-20 NOTE — Progress Notes (Signed)
[id number]

## 2017-08-20 NOTE — Telephone Encounter (Signed)
This 24/18 and we are not giving a 901 day supply as requested by pharmacy

## 2017-08-20 NOTE — Progress Notes (Signed)
Pt sitting up in bed eating and drinking w/o difficulty in NAD, family at bedside, radiologist at bedside earlier to discuss procedure.  Discharge instructions reviewed with pt and family who verbalize understanding.

## 2017-08-20 NOTE — Procedures (Signed)
Interventional Radiology Procedure Note  Procedure: CT guided bone marrow aspiration and biopsy  Complications: None  Findings: Aspirate and core biopsy performed of bone marrow in right iliac bone.  Plan: Bedrest supine x 1 hrs  Fynlee Rowlands T. Kathlene Cote, M.D Pager:  [phone]

## 2017-08-20 NOTE — H&P (Addendum)
Chief Complaint: Patient was seen today for bone marrow biopsy at the request of Rao,Archana C  Referring Physician(s): Rao,Archana C  Patient Status: [facility] - Out-pt  History of Present Illness: Adam Wall is a 71 y.o. male with a history of previous smoldering multiple myeloma.  Known to our service from prior BM biopsy on 10/14/2016 under CT guidance by Dr. Vernard Gambles. 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  Hospital course was complicated at [facility] after nephrectomy. Renal insufficiency worse post-operatively and myeloma labs also now all worse.  BM biopsy in Feb, 2018 demonstrating 36% atypical plasma cells.  Now requires repeat bone marrow biopsy to establish current status of BM involvement.   Past Medical History:  Diagnosis Date  . AICD (automatic cardioverter/defibrillator) present   . Anemia   . Blockage of coronary artery of heart ([place])   . CHF (congestive heart failure) ([place])   . Chronic kidney disease 05/22/2017   nephrectomy-right  . Diabetes mellitus without complication ([place])   . Dysrhythmia   . Heart murmur   . Hypertension   . Hypochloremia   . LBBB (left bundle branch block)   . Neuropathy   . Presence of permanent cardiac pacemaker   . Right renal mass   . Smoldering myeloma [facility])     Past Surgical History:  Procedure Laterality Date  . CARDIAC SURGERY    . CORONARY ARTERY BYPASS GRAFT    . ENDOBRONCHIAL ULTRASOUND N/A 04/03/2017   Procedure: ENDOBRONCHIAL ULTRASOUND;  Surgeon: Flora Lipps, MD;  Location: [facility] ORS;  Service: Cardiopulmonary;  Laterality: N/A;  . Heart Bypass    . PACEMAKER PLACEMENT     ICD    Allergies: Patient has no known allergies.  Medications: Prior to Admission medications   Medication Sig Start Date End Date Taking? Authorizing Provider  acetaminophen (TYLENOL) 500 MG tablet Take 1,000 mg by  mouth every 8 (eight) hours as needed for mild pain or moderate pain.   Yes [provider]  aspirin 81 MG tablet Take 81 mg by mouth daily.   Yes [provider]  atorvastatin (LIPITOR) 40 MG tablet Take 40 mg by mouth at bedtime. 02/27/17  Yes [provider]  carvedilol (COREG) 25 MG tablet Take 25 mg by mouth 2 (two) times daily with a meal.   Yes [provider]  furosemide (LASIX) 40 MG tablet Take 20 mg by mouth daily.   Yes [provider]  gabapentin (NEURONTIN) 100 MG capsule Take 100 mg by mouth daily as needed.    Yes [provider]  hydrALAZINE (APRESOLINE) 25 MG tablet Take 25 mg by mouth 3 (three) times daily.   Yes [provider]  insulin NPH-regular Human (NOVOLIN 70/30) (70-30) 100 UNIT/ML injection Inject 16 Units into the skin.   Yes [provider]  isosorbide dinitrate (ISORDIL) 10 MG tablet Take 10 mg by mouth 3 (three) times daily.   Yes [provider]  oxyCODONE (OXY IR/ROXICODONE) 5 MG immediate release tablet Take 1 tablet (5 mg total) by mouth every 6 (six) hours as needed for severe pain. 08/15/17  Yes Sindy Guadeloupe, MD  tamsulosin (FLOMAX) 0.4 MG CAPS capsule Take 0.4 mg by mouth daily.   Yes [provider]  acyclovir (ZOVIRAX) 400 MG tablet Take 1 tablet (400 mg total) by mouth daily. Patient not taking: Reported on 08/20/2017 08/18/17  Sindy Guadeloupe, MD  dexamethasone (DECADRON) 4 MG tablet Take 10 tablets (40 mg) on days 1, 8, and 15 of chemo. Repeat every 21 days. Patient not taking: Reported on 08/20/2017 08/18/17   Sindy Guadeloupe, MD  dexamethasone (DECADRON) 4 MG tablet Take 10 tablets (40 mg) on days 1, 8, and 15 of chemo. Repeat every 21 days. 08/20/17   Sindy Guadeloupe, MD  lenalidomide (REVLIMID) 15 MG capsule Take 1 capsule (15 mg total) by mouth every other day. For 2 weeks on and 1 week off Patient not taking: Reported on 08/20/2017 08/18/17 09/17/17  Sindy Guadeloupe, MD  LORazepam (ATIVAN) 0.5 MG tablet Take 1 tablet (0.5 mg total) by mouth every 6 (six) hours as needed (Nausea or vomiting). Patient not taking: Reported on 08/20/2017 08/18/17   Sindy Guadeloupe, MD  metFORMIN (GLUCOPHAGE) 1000 MG tablet Take 1,000-1,500 mg by mouth 2 (two) times daily with a meal. Takes 1500 mg in the morning and 1000 mg in the evening    [provider]  ondansetron (ZOFRAN) 8 MG tablet Take 1 tablet (8 mg total) by mouth 2 (two) times daily as needed (Nausea or vomiting). Patient not taking: Reported on 08/20/2017 08/18/17   Sindy Guadeloupe, MD  prochlorperazine (COMPAZINE) 10 MG tablet Take 1 tablet (10 mg total) by mouth every 6 (six) hours as needed (Nausea or vomiting). Patient not taking: Reported on 08/20/2017 08/18/17   Sindy Guadeloupe, MD  sacubitril-valsartan (ENTRESTO) 49-51 MG Take 1 tablet by mouth 2 (two) times daily.    [provider]  sitaGLIPtin (JANUVIA) 100 MG tablet Take 100 mg by mouth daily.    [provider]  vitamin B-12 (CYANOCOBALAMIN) 1000 MCG tablet Take 1,000 mcg by mouth daily.    [provider]  vitamin C (ASCORBIC ACID) 500 MG tablet Take 500 mg by mouth daily.    [provider]     Family History  Problem Relation Age of Onset  . Heart failure Paternal Grandmother     Social History   Socioeconomic History  . Marital status: Married    Spouse name: None  . Number of children: None  . Years of education: None  . Highest education level: None  Social Needs  . Financial resource strain: None  . Food insecurity - worry: None  . Food insecurity - inability: None  . Transportation needs - medical: None  . Transportation needs - non-medical: None  Occupational History  . None  Tobacco Use  . Smoking status: Former Smoker    Years: 10.00    Types: Cigars    Last attempt to quit: 10/04/1993    Years since quitting: 23.8  . Smokeless tobacco: Never Used  . Tobacco comment: 3 CIGARS PER  DAY  Substance and Sexual Activity  . Alcohol use: No  . Drug use: No  . Sexual activity: None  Other Topics Concern  . None  Social History Narrative  . None    ECOG Status: 1 - Symptomatic but completely ambulatory  Review of Systems: A 12 point ROS discussed and pertinent positives are indicated in the HPI above.  All other systems are negative.  Review of Systems  Constitutional: Positive for fatigue.  HENT: Negative.   Respiratory: Negative.   Cardiovascular: Negative.   Gastrointestinal: Negative.   Genitourinary: Negative.   Musculoskeletal: Positive for arthralgias and back pain.  Neurological: Negative.     Vital Signs: BP (!) 176/65   Pulse  80   Temp 98.2 F (36.8 C) (Oral)   Resp 15   Ht 6' (1.829 m)   SpO2 98%   BMI 27.94 kg/m   Physical Exam  Constitutional: He is oriented to person, place, and time. No distress.  HENT:  Head: Normocephalic and atraumatic.  Neck: Neck supple.  Cardiovascular: Normal rate, regular rhythm and normal heart sounds. Exam reveals no gallop and no friction rub.  No murmur heard. Pulmonary/Chest: Effort normal and breath sounds normal. No stridor. No respiratory distress. He has no wheezes. He has no rales.  Abdominal: Soft. Bowel sounds are normal. He exhibits no distension and no mass. There is no tenderness. There is no rebound and no guarding.  Musculoskeletal: He exhibits no edema.  Lymphadenopathy:    He has no cervical adenopathy.  Neurological: He is alert and oriented to person, place, and time.  Skin: Skin is warm and dry. He is not diaphoretic.  Vitals reviewed.   Imaging: No results found.  Labs:  CBC: Recent Labs    10/14/16 0805 02/19/17 0950  07/30/17 0910 08/08/17 1440 08/15/17 1327 08/20/17 0748  WBC 6.2 6.1  --  4.1  --   --  4.2  HGB 9.9* 9.7*   < > 7.5* 8.2* 8.2* 8.6*  HCT 30.6* 30.3*  --  22.9*  --   --  26.6*  PLT 200 200  --  131*  --   --  128*   < > = values in this interval not  displayed.    COAGS: Recent Labs    10/14/16 0805 08/20/17 0748  INR 1.01 1.11  APTT 28 31    BMP: Recent Labs    02/19/17 0950 04/01/17 1055 07/30/17 0910 08/15/17 1625 08/20/17 0748  NA 136  --  136 135 134*  K 4.5 4.1 4.3 4.2 4.3  CL 105  --  103 103 103  CO2 27  --  27 25 26   GLUCOSE 199*  --  128* 60* 128*  BUN 19  --  65* 65* 63*  CALCIUM 9.1  --  9.2 9.4 9.6  CREATININE 1.30*  --  3.26* 3.12* 3.26*  GFRNONAA 53*  --  17* 18* 17*  GFRAA >60  --  20* 21* 20*    LIVER FUNCTION TESTS: Recent Labs    10/04/16 0948 02/19/17 0950 07/30/17 0910  BILITOT 0.6 0.5 0.5  AST 19 18 57*  ALT 15* 10* 92*  ALKPHOS 97 96 120  PROT 8.0 7.7 8.9*  ALBUMIN 3.3* 3.3* 3.3*     Assessment and Plan:  For CT guided bone marrow aspiration and biopsy today.  Risks and benefits discussed with the patient including, but not limited to bleeding, infection, damage to adjacent structures or low yield requiring additional tests. All of the patient's questions were answered, patient is agreeable to proceed. Consent signed and in chart.  Electronically Signed: Azzie Roup, MD 08/20/2017, 8:38 AM   I spent a total of 15 Minutes in face to face in clinical consultation, greater than 50% of which was counseling/coordinating care for bone marrow biopsy.

## 2017-08-21 ENCOUNTER — Telehealth: Payer: Self-pay | Admitting: *Deleted

## 2017-08-21 ENCOUNTER — Telehealth: Payer: Self-pay | Admitting: Oncology

## 2017-08-21 ENCOUNTER — Other Ambulatory Visit: Payer: Self-pay | Admitting: *Deleted

## 2017-08-21 NOTE — Telephone Encounter (Signed)
Prescription too early for refill

## 2017-08-21 NOTE — Telephone Encounter (Signed)
Oral Oncology Patient Advocate Encounter  Prior Authorization for Revlimid has been approved.    PA# [id number] Effective dates: 05/22/2017 through 08/21/2018  Oral Oncology Clinic will continue to follow.    [place] Patient Advocate [phone] 08/21/2017 2:37 PM

## 2017-08-21 NOTE — Telephone Encounter (Signed)
Oral Oncology Patient Advocate Encounter  Received notification from [employer] that prior authorization for Revlimid is required.  PA submitted on CoverMyMeds Key [id number] Status is pending  Oral Oncology Clinic will continue to follow.  [place] Patient Advocate [phone] 08/21/2017 2:35 PM

## 2017-08-21 NOTE — Telephone Encounter (Signed)
Called pt and let him know that we know that he is getting his drug tom. And we do not know what time it will be delivered to him.he states that it will be delivered around 3:30 or a little later.  I told him that since we do not know for sure if he will get it so we will start him on Monday. Pt states he has pet scan that morning so I will have to check with Janese Banks.  He is ok with above and I will call him tom.

## 2017-08-22 ENCOUNTER — Inpatient Hospital Stay: Payer: Medicare Other

## 2017-08-22 ENCOUNTER — Telehealth: Payer: Self-pay | Admitting: Oncology

## 2017-08-22 ENCOUNTER — Other Ambulatory Visit: Payer: Self-pay | Admitting: Oncology

## 2017-08-22 ENCOUNTER — Telehealth: Payer: Self-pay | Admitting: *Deleted

## 2017-08-22 NOTE — Telephone Encounter (Signed)
Oral Oncology Patient Advocate Encounter   Patients Revlimid was mailed out from [facility] 08/22/2017. Patient should receive today.    [place] Patient Advocate [phone] 08/22/2017 8:37 AM

## 2017-08-22 NOTE — Telephone Encounter (Signed)
Patient's wife contacted to let her know that we have canceled the appointment for today which they already knew.  Made a new schedule for Monday in [place].  We know the patient has a PET scan at 1030 and will see him at 1:30 in the afternoon for lab then see the doctor and then get his Velcade treatment and [place].  Wife agrees and understands above

## 2017-08-22 NOTE — Addendum Note (Signed)
Addended by: Darl Pikes on: 08/22/2017 02:59 PM   Modules accepted: Orders

## 2017-08-25 ENCOUNTER — Inpatient Hospital Stay: Payer: Medicare Other

## 2017-08-25 ENCOUNTER — Telehealth: Payer: Self-pay | Admitting: *Deleted

## 2017-08-25 ENCOUNTER — Encounter
Admission: RE | Admit: 2017-08-25 | Discharge: 2017-08-25 | Disposition: A | Discharge status: Home / Self Care | Payer: Medicare Other | Source: Ambulatory Visit | Attending: Oncology | Admitting: Oncology

## 2017-08-25 ENCOUNTER — Other Ambulatory Visit: Payer: Self-pay | Admitting: Oncology

## 2017-08-25 ENCOUNTER — Inpatient Hospital Stay (HOSPITAL_BASED_OUTPATIENT_CLINIC_OR_DEPARTMENT_OTHER): Payer: Medicare Other | Admitting: Oncology

## 2017-08-25 ENCOUNTER — Encounter: Payer: Self-pay | Admitting: Oncology

## 2017-08-25 VITALS — BP 144/73 | HR 69 | Temp 96.9°F | Resp 18

## 2017-08-25 DIAGNOSIS — R5383 Other fatigue: Secondary | ICD-10-CM

## 2017-08-25 DIAGNOSIS — G893 Neoplasm related pain (acute) (chronic): Secondary | ICD-10-CM

## 2017-08-25 DIAGNOSIS — D563 Thalassemia minor: Secondary | ICD-10-CM

## 2017-08-25 DIAGNOSIS — C9000 Multiple myeloma not having achieved remission: Secondary | ICD-10-CM

## 2017-08-25 DIAGNOSIS — Z79899 Other long term (current) drug therapy: Secondary | ICD-10-CM

## 2017-08-25 DIAGNOSIS — Z7189 Other specified counseling: Secondary | ICD-10-CM

## 2017-08-25 DIAGNOSIS — M549 Dorsalgia, unspecified: Secondary | ICD-10-CM

## 2017-08-25 DIAGNOSIS — D509 Iron deficiency anemia, unspecified: Secondary | ICD-10-CM

## 2017-08-25 DIAGNOSIS — M7989 Other specified soft tissue disorders: Secondary | ICD-10-CM

## 2017-08-25 DIAGNOSIS — I129 Hypertensive chronic kidney disease with stage 1 through stage 4 chronic kidney disease, or unspecified chronic kidney disease: Secondary | ICD-10-CM | POA: Diagnosis not present

## 2017-08-25 DIAGNOSIS — I509 Heart failure, unspecified: Secondary | ICD-10-CM | POA: Diagnosis not present

## 2017-08-25 DIAGNOSIS — R590 Localized enlarged lymph nodes: Secondary | ICD-10-CM

## 2017-08-25 DIAGNOSIS — E119 Type 2 diabetes mellitus without complications: Secondary | ICD-10-CM | POA: Diagnosis not present

## 2017-08-25 DIAGNOSIS — Z5111 Encounter for antineoplastic chemotherapy: Secondary | ICD-10-CM

## 2017-08-25 DIAGNOSIS — Z95810 Presence of automatic (implantable) cardiac defibrillator: Secondary | ICD-10-CM

## 2017-08-25 DIAGNOSIS — F17210 Nicotine dependence, cigarettes, uncomplicated: Secondary | ICD-10-CM

## 2017-08-25 DIAGNOSIS — G629 Polyneuropathy, unspecified: Secondary | ICD-10-CM

## 2017-08-25 DIAGNOSIS — N189 Chronic kidney disease, unspecified: Secondary | ICD-10-CM

## 2017-08-25 DIAGNOSIS — R011 Cardiac murmur, unspecified: Secondary | ICD-10-CM

## 2017-08-25 DIAGNOSIS — Z7982 Long term (current) use of aspirin: Secondary | ICD-10-CM

## 2017-08-25 LAB — CBC WITH DIFFERENTIAL/PLATELET
Basophils Absolute: 0 10*3/uL (ref 0–0.1)
Basophils Relative: 0 %
EOS PCT: 4 %
Eosinophils Absolute: 0.2 10*3/uL (ref 0–0.7)
HEMATOCRIT: 24.5 % — AB (ref 40.0–52.0)
Hemoglobin: 7.9 g/dL — ABNORMAL LOW (ref 13.0–18.0)
LYMPHS ABS: 0.9 10*3/uL — AB (ref 1.0–3.6)
LYMPHS PCT: 20 %
MCH: 22.9 pg — AB (ref 26.0–34.0)
MCHC: 32.1 g/dL (ref 32.0–36.0)
MCV: 71.3 fL — AB (ref 80.0–100.0)
MONO ABS: 0.6 10*3/uL (ref 0.2–1.0)
MONOS PCT: 13 %
NEUTROS ABS: 2.9 10*3/uL (ref 1.4–6.5)
Neutrophils Relative %: 63 %
PLATELETS: 123 10*3/uL — AB (ref 150–440)
RBC: 3.43 MIL/uL — ABNORMAL LOW (ref 4.40–5.90)
RDW: 19.1 % — AB (ref 11.5–14.5)
WBC: 4.6 10*3/uL (ref 3.8–10.6)

## 2017-08-25 LAB — COMPREHENSIVE METABOLIC PANEL
ALBUMIN: 3.4 g/dL — AB (ref 3.5–5.0)
ALT: 145 U/L — ABNORMAL HIGH (ref 17–63)
ANION GAP: 8 (ref 5–15)
AST: 86 U/L — AB (ref 15–41)
Alkaline Phosphatase: 125 U/L (ref 38–126)
BILIRUBIN TOTAL: 0.6 mg/dL (ref 0.3–1.2)
BUN: 70 mg/dL — AB (ref 6–20)
CHLORIDE: 102 mmol/L (ref 101–111)
CO2: 23 mmol/L (ref 22–32)
Calcium: 9.4 mg/dL (ref 8.9–10.3)
Creatinine, Ser: 3.36 mg/dL — ABNORMAL HIGH (ref 0.61–1.24)
GFR calc Af Amer: 19 mL/min — ABNORMAL LOW (ref >60)
GFR calc non Af Amer: 17 mL/min — ABNORMAL LOW (ref >60)
GLUCOSE: 182 mg/dL — AB (ref 65–99)
POTASSIUM: 4 mmol/L (ref 3.5–5.1)
SODIUM: 133 mmol/L — AB (ref 135–145)
TOTAL PROTEIN: 9.3 g/dL — AB (ref 6.5–8.1)

## 2017-08-25 LAB — GLUCOSE, CAPILLARY: Glucose-Capillary: 109 mg/dL — ABNORMAL HIGH (ref 65–99)

## 2017-08-25 LAB — LACTATE DEHYDROGENASE: LDH: 129 U/L (ref 98–192)

## 2017-08-25 MED ORDER — PROCHLORPERAZINE MALEATE 10 MG PO TABS
10.0000 mg | ORAL_TABLET | Freq: Once | ORAL | Status: AC
  Administered 2017-08-25: 10 mg via ORAL
  Filled 2017-08-25: qty 1

## 2017-08-25 MED ORDER — MORPHINE SULFATE 2 MG/ML IJ SOLN
4.0000 mg | Freq: Once | INTRAMUSCULAR | Status: AC
  Administered 2017-08-25: 4 mg via INTRAVENOUS

## 2017-08-25 MED ORDER — MORPHINE SULFATE (PF) 2 MG/ML IV SOLN
INTRAVENOUS | Status: AC
  Filled 2017-08-25: qty 2

## 2017-08-25 MED ORDER — OXYCODONE HCL 5 MG PO TABS: 10.0000 mg | ORAL_TABLET | Freq: Once | ORAL | Status: DC

## 2017-08-25 MED ORDER — BORTEZOMIB CHEMO SQ INJECTION 3.5 MG (2.5MG/ML)
1.3000 mg/m2 | Freq: Once | INTRAMUSCULAR | Status: AC
  Administered 2017-08-25: 2.75 mg via SUBCUTANEOUS
  Filled 2017-08-25: qty 2.75

## 2017-08-25 MED ORDER — FLUDEOXYGLUCOSE F - 18 (FDG) INJECTION
12.7300 | Freq: Once | INTRAVENOUS | Status: AC | PRN
  Administered 2017-08-25: 12.73 via INTRAVENOUS

## 2017-08-25 MED ORDER — DEXAMETHASONE 4 MG PO TABS
40.0000 mg | ORAL_TABLET | Freq: Once | ORAL | Status: AC
  Administered 2017-08-25: 40 mg via ORAL
  Filled 2017-08-25: qty 10

## 2017-08-25 NOTE — Telephone Encounter (Signed)
Left VM message and faxed today's office note to Dr. Alfonse Alpers at [facility]. Patient needs to be seen relatively soon per Dr. Janese Banks.     dhs

## 2017-08-25 NOTE — Patient Instructions (Signed)
Dexamethasone tablets What is this medicine? DEXAMETHASONE (dex a METH a sone) is a corticosteroid. It is commonly used to treat inflammation of the skin, joints, lungs, and other organs. Common conditions treated include asthma, allergies, and arthritis. It is also used for other conditions, such as blood disorders and diseases of the adrenal glands. This medicine may be used for other purposes; ask your health care provider or pharmacist if you have questions. COMMON BRAND NAME(S): CUSHINGS SYNDROME DIAGNOSTIC, Decadron, DexPak Jr TaperPak, DexPak TaperPak, Zema-Pak, ZoDex, ZonaCort 11 Day, ZonaCort 7 Day What should I tell my health care provider before I take this medicine? They need to know if you have any of these conditions: -Cushing's syndrome -diabetes -glaucoma -heart problems or disease -high blood pressure -infection like herpes, measles, tuberculosis, or chickenpox -kidney disease -liver disease -mental problems -myasthenia gravis -osteoporosis -previous heart attack -seizures -stomach, ulcer or intestine disease including colitis and diverticulitis -thyroid problem -an unusual or allergic reaction to dexamethasone, corticosteroids, other medicines, lactose, foods, dyes, or preservatives -pregnant or trying to get pregnant -breast-feeding How should I use this medicine? Take this medicine by mouth with a drink of water. Follow the directions on the prescription label. Take it with food or milk to avoid stomach upset. If you are taking this medicine once a day, take it in the morning. Do not take more medicine than you are told to take. Do not suddenly stop taking your medicine because you may develop a severe reaction. Your doctor will tell you how much medicine to take. If your doctor wants you to stop the medicine, the dose may be slowly lowered over time to avoid any side effects. Talk to your pediatrician regarding the use of this medicine in children. Special care may be  needed. Patients over 93 years old may have a stronger reaction and need a smaller dose. Overdosage: If you think you have taken too much of this medicine contact a poison control center or emergency room at once. NOTE: This medicine is only for you. Do not share this medicine with others. What if I miss a dose? If you miss a dose, take it as soon as you can. If it is almost time for your next dose, talk to your doctor or health care professional. You may need to miss a dose or take an extra dose. Do not take double or extra doses without advice. What may interact with this medicine? Do not take this medicine with any of the following medications: -mifepristone, RU-486 -vaccines This medicine may also interact with the following medications: -amphotericin B -antibiotics like clarithromycin, erythromycin, and troleandomycin -aspirin and aspirin-like drugs -barbiturates like phenobarbital -carbamazepine -cholestyramine -cholinesterase inhibitors like donepezil, galantamine, rivastigmine, and tacrine -cyclosporine -digoxin -diuretics -ephedrine -male hormones, like estrogens or progestins and birth control pills -indinavir -isoniazid -ketoconazole -medicines for diabetes -medicines that improve muscle tone or strength for conditions like myasthenia gravis -NSAIDs, medicines for pain and inflammation, like ibuprofen or naproxen -phenytoin -rifampin -thalidomide -warfarin This list may not describe all possible interactions. Give your health care provider a list of all the medicines, herbs, non-prescription drugs, or dietary supplements you use. Also tell them if you smoke, drink alcohol, or use illegal drugs. Some items may interact with your medicine. What should I watch for while using this medicine? Visit your doctor or health care professional for regular checks on your progress. If you are taking this medicine over a prolonged period, carry an identification card with your name  and address, the  type and dose of your medicine, and your doctor's name and address. This medicine may increase your risk of getting an infection. Stay away from people who are sick. Tell your doctor or health care professional if you are around anyone with measles or chickenpox. If you are going to have surgery, tell your doctor or health care professional that you have taken this medicine within the last twelve months. Ask your doctor or health care professional about your diet. You may need to lower the amount of salt you eat. The medicine can increase your blood sugar. If you are a diabetic check with your doctor if you need help adjusting the dose of your diabetic medicine. What side effects may I notice from receiving this medicine? Side effects that you should report to your doctor or health care professional as soon as possible: -allergic reactions like skin rash, itching or hives, swelling of the face, lips, or tongue -changes in vision -fever, sore throat, sneezing, cough, or other signs of infection, wounds that will not heal -increased thirst -mental depression, mood swings, mistaken feelings of self importance or of being mistreated -pain in hips, back, ribs, arms, shoulders, or legs -redness, blistering, peeling or loosening of the skin, including inside the mouth -trouble passing urine or change in the amount of urine -swelling of feet or lower legs -unusual bleeding or bruising Side effects that usually do not require medical attention (report to your doctor or health care professional if they continue or are bothersome): -headache -nausea, vomiting -skin problems, acne, thin and shiny skin -weight gain This list may not describe all possible side effects. Call your doctor for medical advice about side effects. You may report side effects to FDA at 1-800-FDA-1088. Where should I keep my medicine? Keep out of the reach of children. Store at room temperature between 20 and 25  degrees C (68 and 77 degrees F). Protect from light. Throw away any unused medicine after the expiration date. NOTE: This sheet is a summary. It may not cover all possible information. If you have questions about this medicine, talk to your doctor, pharmacist, or health care provider.  2018 Elsevier/Gold Standard (2007-12-03 14:02:13) Bortezomib injection What is this medicine? BORTEZOMIB (bor TEZ oh mib) is a medicine that targets proteins in cancer cells and stops the cancer cells from growing. It is used to treat multiple myeloma and mantle-cell lymphoma. This medicine may be used for other purposes; ask your health care provider or pharmacist if you have questions. COMMON BRAND NAME(S): Velcade What should I tell my health care provider before I take this medicine? They need to know if you have any of these conditions: -diabetes -heart disease -irregular heartbeat -liver disease -on hemodialysis -low blood counts, like low white blood cells, platelets, or hemoglobin -peripheral neuropathy -taking medicine for blood pressure -an unusual or allergic reaction to bortezomib, mannitol, boron, other medicines, foods, dyes, or preservatives -pregnant or trying to get pregnant -breast-feeding How should I use this medicine? This medicine is for injection into a vein or for injection under the skin. It is given by a health care professional in a hospital or clinic setting. Talk to your pediatrician regarding the use of this medicine in children. Special care may be needed. Overdosage: If you think you have taken too much of this medicine contact a poison control center or emergency room at once. NOTE: This medicine is only for you. Do not share this medicine with others. What if I miss a dose? It is  important not to miss your dose. Call your doctor or health care professional if you are unable to keep an appointment. What may interact with this medicine? This medicine may interact with the  following medications: -ketoconazole -rifampin -ritonavir -St. John's Wort This list may not describe all possible interactions. Give your health care provider a list of all the medicines, herbs, non-prescription drugs, or dietary supplements you use. Also tell them if you smoke, drink alcohol, or use illegal drugs. Some items may interact with your medicine. What should I watch for while using this medicine? You may get drowsy or dizzy. Do not drive, use machinery, or do anything that needs mental alertness until you know how this medicine affects you. Do not stand or sit up quickly, especially if you are an older patient. This reduces the risk of dizzy or fainting spells. In some cases, you may be given additional medicines to help with side effects. Follow all directions for their use. Call your doctor or health care professional for advice if you get a fever, chills or sore throat, or other symptoms of a cold or flu. Do not treat yourself. This drug decreases your body's ability to fight infections. Try to avoid being around people who are sick. This medicine may increase your risk to bruise or bleed. Call your doctor or health care professional if you notice any unusual bleeding. You may need blood work done while you are taking this medicine. In some patients, this medicine may cause a serious brain infection that may cause death. If you have any problems seeing, thinking, speaking, walking, or standing, tell your doctor right away. If you cannot reach your doctor, urgently seek other source of medical care. Check with your doctor or health care professional if you get an attack of severe diarrhea, nausea and vomiting, or if you sweat a lot. The loss of too much body fluid can make it dangerous for you to take this medicine. Do not become pregnant while taking this medicine or for at least 2 months after stopping it. Women should inform their doctor if they wish to become pregnant or think they  might be pregnant. Men should not father a child while taking this medicine and for at least 2 months after stopping it. There is a potential for serious side effects to an unborn child. Talk to your health care professional or pharmacist for more information. Do not breast-feed an infant while taking this medicine or for 2 months after stopping it. This medicine may interfere with the ability to have a child. You should talk with your doctor or health care professional if you are concerned about your fertility. What side effects may I notice from receiving this medicine? Side effects that you should report to your doctor or health care professional as soon as possible: -allergic reactions like skin rash, itching or hives, swelling of the face, lips, or tongue -breathing problems -changes in hearing -changes in vision -fast, irregular heartbeat -feeling faint or lightheaded, falls -pain, tingling, numbness in the hands or feet -right upper belly pain -seizures -swelling of the ankles, feet, hands -unusual bleeding or bruising -unusually weak or tired -vomiting -yellowing of the eyes or skin Side effects that usually do not require medical attention (report to your doctor or health care professional if they continue or are bothersome): -changes in emotions or moods -constipation -diarrhea -loss of appetite -headache -irritation at site where injected -nausea This list may not describe all possible side effects. Call your doctor  for medical advice about side effects. You may report side effects to FDA at 1-800-FDA-1088. Where should I keep my medicine? This drug is given in a hospital or clinic and will not be stored at home. NOTE: This sheet is a summary. It may not cover all possible information. If you have questions about this medicine, talk to your doctor, pharmacist, or health care provider.  2018 Elsevier/Gold Standard (2016-07-11 15:53:51)

## 2017-08-25 NOTE — Progress Notes (Signed)
START ON PATHWAY REGIMEN - Multiple Myeloma and Other Plasma Cell Dyscrasias     A cycle is every 21 days:     Bortezomib      Lenalidomide      Dexamethasone   **Always confirm dose/schedule in your pharmacy ordering system**  Patient Characteristics: Newly Diagnosed, Transplant Eligible, Unknown or Awaiting Test Results R-ISS Staging: Unknown Disease Classification: Newly Diagnosed Is Patient Eligible for Transplant<= Transplant Eligible Risk Status: Awaiting Test Results Intent of Therapy: Non-Curative / Palliative Intent, Discussed with Patient 

## 2017-08-25 NOTE — Progress Notes (Signed)
Hematology/Oncology Consult note [facility]  Telephone:([phone] [phone]  Patient Care Team: [name], MD as PCP - General (Pediatrics)   Name of the patient: Adam Wall  [id number]  [birth year]   Date of visit: 08/25/17  Diagnosis- 1. IgG kappa multiple  myeloma 2.RCC s/p right nephrectomy 3. Mediastinal adenopathy of uncertain etiology 4. Iron deficiency anemia   Chief complaint/ Reason for visit- discuss bone marrow biopsy results and start cycle 1 day1 of velcade  Heme/Onc history: patient is a 71 year old male with a past medical history significant for congestive heart failure with an ICD in place, hypertension and diabetes among other comorbidities. Recent SPEP on 09/19/2016 revealed a monoclonal M spike of 1.9 g of IgG kappa. Recent CBC showed white count of 5.5, H&H of 10.1/31.9 with an MCV of 69 of note patient has always been anemic with a hemoglobin between 9.6-10.7 over the last 2 years. Also his MCV has always been between 68-70 inthe past. Iron studies from 06/13/2016 revealed ferritin of 18 and a low TIBC of 224. 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 CMP has always revealed normal kidney function and normal calcium level.  Results of blood work from 10/04/2016 breast follows: CBC showed white count of 4.7, H&H of 10/31.4 with an MCV of 67.8 and a platelet count of 195. CMP was within normal limits with albumin of 3.3 and a calcium of 9.2. Beta 2 microglobulin was mildly elevated at 3. and LDH was mildly low at 90. Ferritin was low at 22 and iron studies showed a low serum iron of 39 and a TIBC low at 230. 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 B12 level was normal at 331.Patient's chronic anemia has been attributed to iron deficiency and thalassemia minor causing microcytosis  Quantitative immunoglobulins revealed an elevated IgG of 2875 and IgG kappa monoclonal M spike of 2.3 g. kappa  lambda free light chain ratio was abnormal at 14.45  Bone marrow biopsy showed hypercellular bone marrow with 36% atypical plasma cells. Cytogenetics and FISH studieswere normal  Skeletal survey showed no definitive evidence of lytic destruction or lesions inthe skeleton.PET scan showed no lytic lesions. Right renal mass was noted as well as FDG avid mediastinal adenopathy. 24 hour urine protein showed 610 mg and M spike of 66 mg  Patient was referred to pulmonary for a EBUSfor mediastinal adenopathy. At the time of procedure the lymph nodes were noted to be small and shotty and procedure was abandoned due to risk of bleeding.  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 Biopsy was complicated by bleeding complications for which patient was admitted. He subsequently underwent laparoscopic right radical nephrectomy in September 2018. Post surgery patient had worsening renal functions. His postoperative course was complicated by bacteremia and urinary tract infection for which she was again hospitalized. Creatinine at that time was 3.5. Also his hemoglobin went down from a baseline of 10 down to 7.2. He did receive blood transfusion sometime in October 2018. He is also being followed by [facility] nephrology.   Myeloma labs from from 07/30/2017 showed elevated IgG of 4053 up from 2797.  M protein went up from 2.2-3.1 g.  Free light chains were elevated at 1030 from 271 from 6 months prior.  Kappa lambda light chain ratio elevated at 48.37 from a prior value of 18.71.  Repeat bone marrow biopsy on 08/20/17 showed 80%  plasma cells in bone marrow consistent with multiple myeloma  PET/CT scan done on 08/25/17 showed: 1. Interval development of hypermetabolism in the L5 vertebral body. Axial CT imaging raises the question of fracture, possibly pathologic. 2. Activity seen previously at T8 and T10 is not evident on  today's exam. 3. Similar but less conspicuous hypermetabolic lymph nodes in the mediastinum and hilar regions. 4. Focal hypermetabolism in the soft tissues posterior to the right calcaneus with diffuse soft tissue edema seen in the lower extremities bilaterally.   Interval history- Patient complains of low back pain gradually getting worse. Reports fatigue  ECOG PS- 1 Pain scale- 5 Opioid associated constipation- no  Review of systems- Review of Systems  Constitutional: Positive for malaise/fatigue. Negative for chills, fever and weight loss.  HENT: Negative for congestion, ear discharge and nosebleeds.   Eyes: Negative for blurred vision.  Respiratory: Negative for cough, hemoptysis, sputum production, shortness of breath and wheezing.   Cardiovascular: Negative for chest pain, palpitations, orthopnea and claudication.  Gastrointestinal: Negative for abdominal pain, blood in stool, constipation, diarrhea, heartburn, melena, nausea and vomiting.  Genitourinary: Negative for dysuria, flank pain, frequency, hematuria and urgency.  Musculoskeletal: Positive for back pain. Negative for joint pain and myalgias.  Skin: Negative for rash.  Neurological: Negative for dizziness, tingling, focal weakness, seizures, weakness and headaches.  Endo/Heme/Allergies: Does not bruise/bleed easily.  Psychiatric/Behavioral: Negative for depression and suicidal ideas. The patient does not have insomnia.       No Known Allergies   Past Medical History:  Diagnosis Date  . AICD (automatic cardioverter/defibrillator) present   . Anemia   . Blockage of coronary artery of heart ([place])   . CHF (congestive heart failure) ([place])   . Chronic kidney disease 05/22/2017   nephrectomy-right  . Diabetes mellitus without complication ([place])   . Dysrhythmia   . Heart murmur   . Hypertension   . Hypochloremia   . LBBB (left bundle branch block)   . Neuropathy   . Presence of permanent cardiac pacemaker   .  Right renal mass   . 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 CARDIAC SURGERY    . CORONARY ARTERY BYPASS GRAFT    . ENDOBRONCHIAL ULTRASOUND N/A 04/03/2017   Procedure: ENDOBRONCHIAL ULTRASOUND;  Surgeon: Flora Lipps, MD;  Location: [facility] ORS;  Service: Cardiopulmonary;  Laterality: N/A;  . Heart Bypass    . PACEMAKER PLACEMENT     ICD    Social History   Socioeconomic History  . Marital status: Married    Spouse name: Not on file  . Number of children: Not on file  . Years of education: Not on file  . Highest education level: Not on file  Social Needs  . Financial resource strain: Not on file  . Food insecurity - worry: Not on file  . Food insecurity - inability: Not on file  . Transportation needs - medical: Not on file  . Transportation needs - non-medical: Not on file  Occupational History  . Not on file  Tobacco Use  . Smoking status: Former Smoker    Years: 10.00    Types: Cigars    Last attempt to quit: 10/04/1993    Years since quitting: 23.9  . Smokeless tobacco: Never Used  . Tobacco comment: 3 CIGARS PER DAY  Substance and Sexual Activity  . Alcohol use: No  . Drug use: No  . Sexual activity: Not on file  Other Topics Concern  . Not on file  Social History Narrative  . Not on file    Family History  Problem Relation Age of Onset  . Heart failure Paternal Grandmother      Current Outpatient Medications:  .  acetaminophen (TYLENOL) 500 MG tablet, Take 1,000 mg by mouth every 8 (eight) hours as needed for mild pain or moderate pain., Disp: , Rfl:  .  acyclovir (ZOVIRAX) 400 MG tablet, Take 1 tablet (400 mg total) by mouth daily. (Patient not taking: Reported on 08/20/2017), Disp: 30 tablet, Rfl: 5 .  aspirin 81 MG tablet, Take 81 mg by mouth daily., Disp: , Rfl:  .  atorvastatin (LIPITOR) 40 MG tablet, Take 40 mg by mouth at bedtime., Disp: , Rfl:  .  carvedilol (COREG) 25 MG tablet, Take 25 mg by mouth 2  (two) times daily with a meal., Disp: , Rfl:  .  dexamethasone (DECADRON) 4 MG tablet, Take 10 tablets (40 mg) on days 1, 8, and 15 of chemo. Repeat every 21 days., Disp: 30 tablet, Rfl: 3 .  furosemide (LASIX) 40 MG tablet, Take 20 mg by mouth daily., Disp: , Rfl:  .  gabapentin (NEURONTIN) 100 MG capsule, Take 100 mg by mouth daily as needed. , Disp: , Rfl:  .  hydrALAZINE (APRESOLINE) 25 MG tablet, Take 25 mg by mouth 3 (three) times daily., Disp: , Rfl:  .  insulin NPH-regular Human (NOVOLIN 70/30) (70-30) 100 UNIT/ML injection, Inject 16 Units into the skin., Disp: , Rfl:  .  isosorbide dinitrate (ISORDIL) 10 MG tablet, Take 10 mg by mouth 3 (three) times daily., Disp: , Rfl:  .  lenalidomide (REVLIMID) 15 MG capsule, Take 1 capsule (15 mg total) by mouth every other day. For 2 weeks on and 1 week off, Disp: 8 capsule, Rfl: 4 .  LORazepam (ATIVAN) 0.5 MG tablet, Take 1 tablet (0.5 mg total) by mouth every 6 (six) hours as needed (Nausea or vomiting). (Patient not taking: Reported on 08/20/2017), Disp: 30 tablet, Rfl: 0 .  metFORMIN (GLUCOPHAGE) 1000 MG tablet, Take 1,000-1,500 mg by mouth 2 (two) times daily with a meal. Takes 1500 mg in the morning and 1000 mg in the evening, Disp: , Rfl:  .  ondansetron (ZOFRAN) 8 MG tablet, Take 1 tablet (8 mg total) by mouth 2 (two) times daily as needed (Nausea or vomiting). (Patient not taking: Reported on 08/20/2017), Disp: 30 tablet, Rfl: 1 .  oxyCODONE (OXY IR/ROXICODONE) 5 MG immediate release tablet, Take 1 tablet (5 mg total) by mouth every 6 (six) hours as needed for severe pain., Disp: 60 tablet, Rfl: 0 .  prochlorperazine (COMPAZINE) 10 MG tablet, Take 1 tablet (10 mg total) by mouth every 6 (six) hours as needed (Nausea or vomiting). (Patient not taking: Reported on 08/20/2017), Disp: 30 tablet, Rfl: 1 .  sacubitril-valsartan (ENTRESTO) 49-51 MG, Take 1 tablet by mouth 2 (two) times daily., Disp: , Rfl:  .  sitaGLIPtin (JANUVIA) 100 MG tablet, Take  100 mg by mouth daily., Disp: , Rfl:  .  tamsulosin (FLOMAX) 0.4 MG CAPS capsule, Take 0.4 mg by mouth daily., Disp: , Rfl:  .  vitamin B-12 (CYANOCOBALAMIN) 1000 MCG tablet, Take 1,000 mcg by mouth daily., Disp: , Rfl:  .  vitamin C (ASCORBIC ACID) 500 MG tablet, Take 500 mg by mouth daily., Disp: , Rfl:  No current facility-administered medications for this visit.   Facility-Administered Medications Ordered in Other Visits:  .  bortezomib SQ (VELCADE)  chemo injection 2.75 mg, 1.3 mg/m2 (Treatment Plan Recorded), Subcutaneous, Once, Sindy Guadeloupe, MD  Physical exam:  Vitals:   08/25/17 1347  BP: (!) 144/73  Pulse: 69  Resp: 18  Temp: (!) 96.9 F (36.1 C)  TempSrc: Tympanic   Physical Exam  Constitutional: He is oriented to person, place, and time.  Appears fatigued and in mild distress from back pain  HENT:  Head: Normocephalic and atraumatic.  Eyes: EOM are normal. Pupils are equal, round, and reactive to light.  Neck: Normal range of motion.  Cardiovascular: Normal rate, regular rhythm and normal heart sounds.  Pulmonary/Chest: Effort normal and breath sounds normal.  Abdominal: Soft. Bowel sounds are normal.  Musculoskeletal: He exhibits edema (b/l +1).  Neurological: He is alert and oriented to person, place, and time.  Skin: Skin is warm and dry.     CMP Latest Ref Rng & Units 08/20/2017  Glucose 65 - 99 mg/dL 128(H)  BUN 6 - 20 mg/dL 63(H)  Creatinine 0.61 - 1.24 mg/dL 3.26(H)  Sodium 135 - 145 mmol/L 134(L)  Potassium 3.5 - 5.1 mmol/L 4.3  Chloride 101 - 111 mmol/L 103  CO2 22 - 32 mmol/L 26  Calcium 8.9 - 10.3 mg/dL 9.6  Total Protein 6.5 - 8.1 g/dL -  Total Bilirubin 0.3 - 1.2 mg/dL -  Alkaline Phos 38 - 126 U/L -  AST 15 - 41 U/L -  ALT 17 - 63 U/L -   CBC Latest Ref Rng & Units 08/20/2017  WBC 3.8 - 10.6 K/uL 4.2  Hemoglobin 13.0 - 18.0 g/dL 8.6(L)  Hematocrit 40.0 - 52.0 % 26.6(L)  Platelets 150 - 440 K/uL 128(L)    No images are attached to the  encounter.  Ct Bone Marrow Biopsy & Aspiration  Result Date: 08/20/2017 CLINICAL DATA:  Multiple myeloma. Need for bone marrow biopsy to establish status of plasma cells in the bone marrow. EXAM: CT GUIDED BONE MARROW ASPIRATION AND BIOPSY ANESTHESIA/SEDATION: Versed 3.0 mg IV, Fentanyl 75 mcg IV Total Moderate Sedation Time:  17 minutes. The patient's level of consciousness and physiologic status were continuously monitored during the procedure by Radiology nursing. PROCEDURE: The procedure risks, benefits, and alternatives were explained to the patient. Questions regarding the procedure were encouraged and answered. The patient understands and consents to the procedure. A time out was performed prior to initiating the procedure. The right gluteal region was prepped with chlorhexidine. Sterile gown and sterile gloves were used for the procedure. Local anesthesia was provided with 1% Lidocaine. Under CT guidance, an 11 gauge On Control bone cutting needle was advanced from a posterior approach into the right iliac bone. Needle positioning was confirmed with CT. Initial non heparinized and heparinized aspirate samples were obtained of bone marrow. Core biopsy was performed via the On Control drill needle. COMPLICATIONS: None FINDINGS: Inspection of initial aspirate did reveal visible particles. Intact core biopsy sample was obtained. IMPRESSION: CT guided bone marrow biopsy of right posterior iliac bone with both aspirate and core samples obtained. Electronically Signed   By: Aletta Edouard M.D.   On: 08/20/2017 09:58     Assessment and plan- Patient is a 71 y.o. male with IgG kappa multiple myeloma (ISS staging pending based on LDH and beta 2 microglobulin from today)  Discussed results of bone marrow biopsy which shows 80% plasma cells in bone marrow consistent with overt multiple myeloma. PET scan also shows pathological fracture of L5 vertebra  Patient has received his revlimid shipment and will  start taking 15 mg PO Q48 hours starting today which will be cycle 1 due to AKI. His calculated eGFR is 19. If renal functions deteriorate further, I will have to switch him to CyBord. I will touch base with [facility] Nephrology Dr. Alfonse Alpers to follow him closely as well  Continue low dose aspirin.   Will start velcade at 1.3 mg/meter square D1, D4, 8 and 11 starting today.Marvel Plan in Blood 2010, [place] in NEJM 2017)  He will get 40 mg decadron with each dose of velcade.   Acyclovir prophylaxis at 400 mg PO daily  Will refer him for second opinion with Dr. Amalia Hailey at [facility] and consideration for ASCT down the line if he is a candidate  Patient will proceed with Day 4 on 08/28/17 followed by day 8 and 11 on 1/7 and 09/04/17 respectively. Cbc with diff on 09/01/17. Will type and screen on 08/28/17 and schedule for possible transfusion on 08/29/17 if hb <7 or hct <21  He will get his first dose of xgeva this week 120 mg and every 4 weeks  Given pathological fracture at L5- I will refer him to rad onc for palliative RT. Also start pulse dose decadron 40 mg X4 days to help with pain  I will see him back on 09/15/17 with cbc, cmp, myeloma and panel and serum free light chains     Visit Diagnosis 1. Multiple myeloma not having achieved remission ([place])   2. Encounter for antineoplastic chemotherapy   3. Neoplasm related pain   4. Goals of care, counseling/discussion      Dr. Randa Evens, MD, MPH [facility] Pager- [phone] 08/25/2017 2:37 PM

## 2017-08-25 NOTE — Progress Notes (Signed)
Patient had scan earlier today and the right arm was used for the IV contrast.  The left arm was stuck for labs.  Patient needed IV morphine and two IV attempts were made with one in each hand.  The IV infiltrated after Morphine was instilled.  Patient complaints of back pain which was relieved with the IV push morphine.

## 2017-08-26 LAB — BETA 2 MICROGLOBULIN, SERUM: Beta-2 Microglobulin: 12.5 mg/L — ABNORMAL HIGH (ref 0.6–2.4)

## 2017-08-27 ENCOUNTER — Other Ambulatory Visit: Payer: Self-pay | Admitting: Oncology

## 2017-08-27 ENCOUNTER — Ambulatory Visit
Admission: RE | Admit: 2017-08-27 | Discharge: 2017-08-27 | Disposition: A | Discharge status: Home / Self Care | Payer: Medicare Other | Source: Ambulatory Visit | Attending: Radiation Oncology | Admitting: Radiation Oncology

## 2017-08-27 ENCOUNTER — Ambulatory Visit: Payer: Medicare Other

## 2017-08-27 ENCOUNTER — Other Ambulatory Visit: Payer: Self-pay

## 2017-08-27 ENCOUNTER — Encounter: Payer: Self-pay | Admitting: Radiation Oncology

## 2017-08-27 ENCOUNTER — Other Ambulatory Visit: Payer: Self-pay | Admitting: *Deleted

## 2017-08-27 VITALS — BP 156/67 | HR 80 | Temp 97.0°F | Resp 18 | Wt 210.5 lb

## 2017-08-27 DIAGNOSIS — G629 Polyneuropathy, unspecified: Secondary | ICD-10-CM | POA: Insufficient documentation

## 2017-08-27 DIAGNOSIS — Z7982 Long term (current) use of aspirin: Secondary | ICD-10-CM | POA: Diagnosis not present

## 2017-08-27 DIAGNOSIS — C9000 Multiple myeloma not having achieved remission: Secondary | ICD-10-CM | POA: Insufficient documentation

## 2017-08-27 DIAGNOSIS — E878 Other disorders of electrolyte and fluid balance, not elsewhere classified: Secondary | ICD-10-CM | POA: Diagnosis not present

## 2017-08-27 DIAGNOSIS — Z79899 Other long term (current) drug therapy: Secondary | ICD-10-CM | POA: Diagnosis not present

## 2017-08-27 DIAGNOSIS — E119 Type 2 diabetes mellitus without complications: Secondary | ICD-10-CM | POA: Diagnosis not present

## 2017-08-27 DIAGNOSIS — I447 Left bundle-branch block, unspecified: Secondary | ICD-10-CM | POA: Diagnosis not present

## 2017-08-27 DIAGNOSIS — Z794 Long term (current) use of insulin: Secondary | ICD-10-CM | POA: Diagnosis not present

## 2017-08-27 DIAGNOSIS — R011 Cardiac murmur, unspecified: Secondary | ICD-10-CM | POA: Insufficient documentation

## 2017-08-27 DIAGNOSIS — I509 Heart failure, unspecified: Secondary | ICD-10-CM | POA: Diagnosis not present

## 2017-08-27 DIAGNOSIS — Z87891 Personal history of nicotine dependence: Secondary | ICD-10-CM | POA: Diagnosis not present

## 2017-08-27 DIAGNOSIS — S32059A Unspecified fracture of fifth lumbar vertebra, initial encounter for closed fracture: Secondary | ICD-10-CM

## 2017-08-27 DIAGNOSIS — N189 Chronic kidney disease, unspecified: Secondary | ICD-10-CM | POA: Diagnosis not present

## 2017-08-27 DIAGNOSIS — Z85528 Personal history of other malignant neoplasm of kidney: Secondary | ICD-10-CM | POA: Insufficient documentation

## 2017-08-27 DIAGNOSIS — I129 Hypertensive chronic kidney disease with stage 1 through stage 4 chronic kidney disease, or unspecified chronic kidney disease: Secondary | ICD-10-CM | POA: Diagnosis not present

## 2017-08-27 DIAGNOSIS — Z95810 Presence of automatic (implantable) cardiac defibrillator: Secondary | ICD-10-CM | POA: Insufficient documentation

## 2017-08-27 DIAGNOSIS — Z510 Encounter for antineoplastic radiation therapy: Secondary | ICD-10-CM | POA: Insufficient documentation

## 2017-08-27 NOTE — Consult Note (Signed)
NEW PATIENT EVALUATION  Name: Adam Wall  MRN: [id number]  Date:   08/27/2017     DOB: 01/01/1946   This 72 y.o. male patient presents to the clinic for initial evaluation of multiple myeloma with probable compression fracture of L5 for palliative radiation therapy.  REFERRING PHYSICIAN: [name], MD  CHIEF COMPLAINT:  Chief Complaint  Patient presents with  . Multiple Myeloma    Initial Evaluation    DIAGNOSIS: The encounter diagnosis was Multiple myeloma not having achieved remission ([place]).   PREVIOUS INVESTIGATIONS:  PET CT scan reviewed Pathology reports reviewed Clinical notes reviewed  HPI: Patient is a 72 year old male with significant past medical history for congestive heart failure adult onset diabetes renal cell carcinoma and monoclonal M spike of IgG Kappa. He was diagnosed approximately 1 year ago with an M spike and bone marrow biopsy showed hypercellular marrow with 36% atypical plasma cells although cytogenetics and FISH studies were normal. At that time no evidence of lytic or destructive lesions on skeletal survey were appreciated. 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 Recently had increased in his IgG and repeat bone marrow showed 80% plasma cells consistent with multiple myeloma. He has presented now with increasing lower back pain and PET/CT scan done January 31 showed L5 vertebral body involvement with possible pathologic fracture. He is scheduled to start Revlimid and is scheduled to start Velcade next week. He is seen today for consideration of palliative radiation therapy. He is having extreme pain radiating to both lower extremities with significant inability to ambulate.  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    PAST SURGICAL HISTORY:  Past Surgical History:  Procedure Laterality Date  . CARDIAC SURGERY    . CORONARY ARTERY BYPASS GRAFT    . ENDOBRONCHIAL ULTRASOUND N/A 04/03/2017   Procedure: ENDOBRONCHIAL ULTRASOUND;  Surgeon: Flora Lipps, MD;  Location: [facility] ORS;  Service: Cardiopulmonary;  Laterality: N/A;  . Heart Bypass    . PACEMAKER PLACEMENT     ICD    FAMILY HISTORY: family history includes Heart failure in his paternal grandmother.  SOCIAL HISTORY:  reports that he quit smoking about 23 years ago. His smoking use included cigars. He quit after 10.00 years of use. he has never used smokeless tobacco. He reports that he does not drink alcohol or use drugs.  ALLERGIES: Patient has no known allergies.  MEDICATIONS:  Current Outpatient Medications  Medication Sig Dispense Refill  . acetaminophen (TYLENOL) 500 MG tablet Take 1,000 mg by mouth every 8 (eight) hours as needed for mild pain or moderate pain.    Marland Kitchen acyclovir (ZOVIRAX) 400 MG tablet Take 1 tablet (400 mg total) by mouth daily. (Patient not taking: Reported on 08/20/2017) 30 tablet 5  . aspirin 81 MG tablet Take 81 mg by mouth daily.    Marland Kitchen atorvastatin (LIPITOR) 40 MG tablet Take 40 mg by mouth at bedtime.    . carvedilol (COREG) 25 MG tablet Take 25 mg by mouth 2 (two) times daily with a meal.    .  furosemide (LASIX) 40 MG tablet Take 20 mg by mouth daily.    Marland Kitchen gabapentin (NEURONTIN) 100 MG capsule Take 100 mg by mouth daily as needed.     . hydrALAZINE (APRESOLINE) 25 MG tablet Take 25 mg by mouth 3 (three) times daily.    . insulin NPH-regular Human (NOVOLIN 70/30) (70-30) 100  UNIT/ML injection Inject 16 Units into the skin.    Marland Kitchen isosorbide dinitrate (ISORDIL) 10 MG tablet Take 10 mg by mouth 3 (three) times daily.    Marland Kitchen lenalidomide (REVLIMID) 15 MG capsule Take 1 capsule (15 mg total) by mouth every other day. For 2 weeks on and 1 week off 8 capsule 4  . LORazepam (ATIVAN) 0.5 MG tablet Take 1 tablet (0.5 mg total) by mouth every 6 (six) hours as needed (Nausea or vomiting). (Patient not taking: Reported on 08/20/2017) 30 tablet 0  . metFORMIN (GLUCOPHAGE) 1000 MG tablet Take 1,000-1,500 mg by mouth 2 (two) times daily with a meal. Takes 1500 mg in the morning and 1000 mg in the evening    . ondansetron (ZOFRAN) 8 MG tablet Take 1 tablet (8 mg total) by mouth 2 (two) times daily as needed (Nausea or vomiting). (Patient not taking: Reported on 08/20/2017) 30 tablet 1  . oxyCODONE (OXY IR/ROXICODONE) 5 MG immediate release tablet Take 1 tablet (5 mg total) by mouth every 6 (six) hours as needed for severe pain. 60 tablet 0  . prochlorperazine (COMPAZINE) 10 MG tablet Take 1 tablet (10 mg total) by mouth every 6 (six) hours as needed (Nausea or vomiting). (Patient not taking: Reported on 08/20/2017) 30 tablet 1  . sacubitril-valsartan (ENTRESTO) 49-51 MG Take 1 tablet by mouth 2 (two) times daily.    . sitaGLIPtin (JANUVIA) 100 MG tablet Take 100 mg by mouth daily.    . tamsulosin (FLOMAX) 0.4 MG CAPS capsule Take 0.4 mg by mouth daily.    . vitamin B-12 (CYANOCOBALAMIN) 1000 MCG tablet Take 1,000 mcg by mouth daily.    . vitamin C (ASCORBIC ACID) 500 MG tablet Take 500 mg by mouth daily.     No current facility-administered medications for this encounter.     ECOG PERFORMANCE STATUS:  2 - Symptomatic, <50% confined to bed  REVIEW OF SYSTEMS: Except for the significant pain Patient denies any weight loss, fatigue, weakness, fever, chills or night sweats. Patient denies any loss of vision, blurred vision. Patient denies any ringing  of the ears or hearing loss. No  irregular heartbeat. Patient denies heart murmur or history of fainting. Patient denies any chest pain or pain radiating to her upper extremities. Patient denies any shortness of breath, difficulty breathing at night, cough or hemoptysis. Patient denies any swelling in the lower legs. Patient denies any nausea vomiting, vomiting of blood, or coffee ground material in the vomitus. Patient denies any stomach pain. Patient states has had normal bowel movements no significant constipation or diarrhea. Patient denies any dysuria, hematuria or significant nocturia. Patient denies any problems walking, swelling in the joints or loss of balance. Patient denies any skin changes, loss of hair or loss of weight. Patient denies any excessive worrying or anxiety or significant depression. Patient denies any problems with insomnia. Patient denies excessive thirst, polyuria, polydipsia. Patient denies any swollen glands, patient denies easy bruising or easy bleeding. Patient denies any recent infections, allergies or URI. Patient "s visual fields have not changed significantly in recent time.    PHYSICAL EXAM: BP (!) 156/67   Pulse 80  Temp (!) 97 F (36.1 C)   Resp 18   Wt 210 lb 8.6 oz (95.5 kg)   BMI 28.55 kg/m  Well-developed male in moderate pain and discomfort wheelchair-bound. Proprioception is intact. He does seem to have some decreased motor strength in his lower extremities which may be related to pain. No sensory level is identified. Well-developed well-nourished patient in NAD. HEENT reveals PERLA, EOMI, discs not visualized.  Oral cavity is clear. No oral mucosal lesions are identified. Neck is clear without evidence of cervical or supraclavicular adenopathy. Lungs are clear to A&P. Cardiac examination is essentially unremarkable with regular rate and rhythm without murmur rub or thrill. Abdomen is benign with no organomegaly or masses noted. Motor sensory and DTR levels are equal and symmetric in the  upper and lower extremities. Cranial nerves II through XII are grossly intact. Proprioception is intact. No peripheral adenopathy or edema is identified. No motor or sensory levels are noted. Crude visual fields are within normal range.  LABORATORY DATA: Pathology reports reviewed    RADIOLOGY RESULTS: PET scan reviewed and compatible with the above-stated findings   IMPRESSION: Multiple myeloma with possible compression fracture of L90 in 72 year old male  PLAN: At this time like to go ahead with palliative radiation therapy to his L5 region and possible include some of his SI joints would seem to be involved with hypermetabolic activity on his PET/CT scan. I will limit my areas of treatment in case the patient may be a bone marrow candidate in the future. I also agree with referral for possible kyphoplasty since radiation therapy will not significantly affect his pain with a pathologic fracture present. I have personally ordered and scheduled CT simulation for tomorrow. Would plan on delivering 3000 cGy in 10 fractions. Risks and benefits of treatment including fatigue alteration of blood counts skin reaction possible diarrhea all were discussed in detail with the patient. He seems to comprehend my treatment plan well. Referral also has been made for kyphoplasty evaluation.  I would like to take this opportunity to thank you for allowing me to participate in the care of your patient.Noreene Filbert, MD

## 2017-08-28 ENCOUNTER — Ambulatory Visit
Admission: RE | Admit: 2017-08-28 | Discharge: 2017-08-28 | Disposition: A | Discharge status: Home / Self Care | Payer: Medicare Other | Source: Ambulatory Visit | Attending: Radiation Oncology | Admitting: Radiation Oncology

## 2017-08-28 ENCOUNTER — Other Ambulatory Visit: Payer: Self-pay | Admitting: Oncology

## 2017-08-28 ENCOUNTER — Ambulatory Visit
Admission: RE | Admit: 2017-08-28 | Discharge: 2017-08-28 | Disposition: A | Discharge status: Home / Self Care | Payer: Medicare Other | Source: Ambulatory Visit | Attending: Oncology | Admitting: Oncology

## 2017-08-28 ENCOUNTER — Telehealth: Payer: Self-pay | Admitting: *Deleted

## 2017-08-28 DIAGNOSIS — S32059A Unspecified fracture of fifth lumbar vertebra, initial encounter for closed fracture: Secondary | ICD-10-CM | POA: Diagnosis not present

## 2017-08-28 DIAGNOSIS — X58XXXA Exposure to other specified factors, initial encounter: Secondary | ICD-10-CM | POA: Diagnosis not present

## 2017-08-28 DIAGNOSIS — C9000 Multiple myeloma not having achieved remission: Secondary | ICD-10-CM | POA: Diagnosis present

## 2017-08-28 MED ORDER — FENTANYL 12 MCG/HR TD PT72: 12.5000 ug | MEDICATED_PATCH | TRANSDERMAL | 0 refills | Status: DC

## 2017-08-28 NOTE — Telephone Encounter (Signed)
-----   Message from [place] sent at 08/28/2017 11:46 AM EST ----- Regarding: here at my desk Contact: [phone] Ext pain meds-pain is coming back

## 2017-08-28 NOTE — Telephone Encounter (Signed)
Pt in radiation and states that the pills helped pain some but he is still hurting to a level that he feels he needs a long acting medication to help him be comfortable. Dr.Rao will send in patch. I will call pt and let him know. The pharmacy called and said that the patch needs to be prior auth. For patch. I called the number given to me by pharmacy and spoke to [facility] and he approved the duragesic. I called pharmacy and they ran rx and it will be ready for pt. The cost is 155.00.  Called pt's home and spoke to wife and told her the above and she will get med for pt.explained to her as well as pt that he should [put the patch on a fatty part of body like arms or abdomen or thighs. Change patch every 3 days and rotate to different areas each time. Also it can make you constipated and pt already on senokot and miralax and it is working good. Patient was told that he will need to cont. Taking the breakthrough pain med. As needed by his pain rating.

## 2017-08-28 NOTE — Progress Notes (Signed)
fentanyl

## 2017-08-29 ENCOUNTER — Telehealth: Payer: Self-pay | Admitting: *Deleted

## 2017-08-29 ENCOUNTER — Inpatient Hospital Stay: Payer: Medicare Other

## 2017-08-29 ENCOUNTER — Inpatient Hospital Stay: Payer: Medicare Other | Attending: Oncology

## 2017-08-29 ENCOUNTER — Telehealth: Payer: Self-pay | Admitting: Oncology

## 2017-08-29 ENCOUNTER — Other Ambulatory Visit: Payer: Self-pay | Admitting: *Deleted

## 2017-08-29 VITALS — BP 184/78 | HR 72 | Temp 97.8°F | Resp 18

## 2017-08-29 DIAGNOSIS — Z87891 Personal history of nicotine dependence: Secondary | ICD-10-CM | POA: Insufficient documentation

## 2017-08-29 DIAGNOSIS — D563 Thalassemia minor: Secondary | ICD-10-CM | POA: Diagnosis not present

## 2017-08-29 DIAGNOSIS — D509 Iron deficiency anemia, unspecified: Secondary | ICD-10-CM | POA: Insufficient documentation

## 2017-08-29 DIAGNOSIS — C9000 Multiple myeloma not having achieved remission: Secondary | ICD-10-CM | POA: Diagnosis present

## 2017-08-29 DIAGNOSIS — N189 Chronic kidney disease, unspecified: Secondary | ICD-10-CM | POA: Diagnosis not present

## 2017-08-29 DIAGNOSIS — G893 Neoplasm related pain (acute) (chronic): Secondary | ICD-10-CM | POA: Insufficient documentation

## 2017-08-29 DIAGNOSIS — Z5112 Encounter for antineoplastic immunotherapy: Secondary | ICD-10-CM | POA: Insufficient documentation

## 2017-08-29 LAB — CBC WITH DIFFERENTIAL/PLATELET
Basophils Absolute: 0 10*3/uL (ref 0–0.1)
Basophils Relative: 0 %
EOS PCT: 0 %
Eosinophils Absolute: 0 10*3/uL (ref 0–0.7)
HCT: 27.1 % — ABNORMAL LOW (ref 40.0–52.0)
Hemoglobin: 8.8 g/dL — ABNORMAL LOW (ref 13.0–18.0)
LYMPHS ABS: 0.4 10*3/uL — AB (ref 1.0–3.6)
LYMPHS PCT: 7 %
MCH: 22.8 pg — AB (ref 26.0–34.0)
MCHC: 32.4 g/dL (ref 32.0–36.0)
MCV: 70.1 fL — AB (ref 80.0–100.0)
MONO ABS: 0.5 10*3/uL (ref 0.2–1.0)
Monocytes Relative: 9 %
Neutro Abs: 4.7 10*3/uL (ref 1.4–6.5)
Neutrophils Relative %: 84 %
PLATELETS: 114 10*3/uL — AB (ref 150–440)
RBC: 3.87 MIL/uL — AB (ref 4.40–5.90)
RDW: 18.6 % — ABNORMAL HIGH (ref 11.5–14.5)
WBC: 5.6 10*3/uL (ref 3.8–10.6)

## 2017-08-29 LAB — TYPE AND SCREEN
ABO/RH(D): O POS
Antibody Screen: NEGATIVE

## 2017-08-29 MED ORDER — PROCHLORPERAZINE MALEATE 10 MG PO TABS
10.0000 mg | ORAL_TABLET | Freq: Once | ORAL | Status: AC
  Administered 2017-08-29: 10 mg via ORAL

## 2017-08-29 MED ORDER — BORTEZOMIB CHEMO SQ INJECTION 3.5 MG (2.5MG/ML)
1.3000 mg/m2 | Freq: Once | INTRAMUSCULAR | Status: AC
Start: 2017-08-29 — End: 2017-08-29
  Administered 2017-08-29: 2.75 mg via SUBCUTANEOUS
  Filled 2017-08-29: qty 1.1

## 2017-08-29 MED ORDER — DENOSUMAB 120 MG/1.7ML ~~LOC~~ SOLN
120.0000 mg | Freq: Once | SUBCUTANEOUS | Status: AC
  Administered 2017-08-29: 120 mg via SUBCUTANEOUS

## 2017-08-29 NOTE — Telephone Encounter (Signed)
That's a month from now. Can they not see him sooner? In next couple of weeks?

## 2017-08-29 NOTE — Telephone Encounter (Signed)
Appointment made with Dr. Seward Speck at [facility] nephrology for follow up and to be followed closely during treatment for multiple myeloma, per Dr. Elroy Channel request. Appt is February 4th at 12:30.  Will give patient details at his next appt.      dhs

## 2017-08-29 NOTE — Telephone Encounter (Signed)
Oral Oncology Patient Advocate Encounter  [employer] diagnosis verification form was completed and faxed to [employer] at [phone]  This is the last step from the office needed to secure continued patient access to funding.   [place] Patient Advocate [phone] 08/29/2017 3:17 PM

## 2017-08-29 NOTE — Telephone Encounter (Signed)
Spoke to [place] at [facility] kidney specialty and transplant office about moving up the appt. for this patient per Dr. Elroy Channel request. Appt changed to 09/10/17 at 2:30 pm.   Patient was notified    dhs

## 2017-09-01 ENCOUNTER — Inpatient Hospital Stay: Payer: Medicare Other

## 2017-09-01 VITALS — BP 160/72 | HR 74 | Temp 97.6°F | Resp 18

## 2017-09-01 DIAGNOSIS — C9000 Multiple myeloma not having achieved remission: Secondary | ICD-10-CM

## 2017-09-01 DIAGNOSIS — Z5112 Encounter for antineoplastic immunotherapy: Secondary | ICD-10-CM | POA: Diagnosis not present

## 2017-09-01 MED ORDER — PROCHLORPERAZINE MALEATE 10 MG PO TABS
10.0000 mg | ORAL_TABLET | Freq: Once | ORAL | Status: AC
  Administered 2017-09-01: 10 mg via ORAL
  Filled 2017-09-01: qty 1

## 2017-09-01 MED ORDER — BORTEZOMIB CHEMO SQ INJECTION 3.5 MG (2.5MG/ML)
1.3000 mg/m2 | Freq: Once | INTRAMUSCULAR | Status: AC
  Administered 2017-09-01: 2.75 mg via SUBCUTANEOUS
  Filled 2017-09-01: qty 2.75

## 2017-09-01 MED ORDER — DEXAMETHASONE 4 MG PO TABS: 40.0000 mg | ORAL_TABLET | Freq: Once | ORAL | Status: DC

## 2017-09-01 NOTE — Progress Notes (Signed)
Patient's caregiver just stated she has patient's steroid dose with her and will give that to him now.

## 2017-09-01 NOTE — Progress Notes (Signed)
Patient receiving Velcade today per Dr. Janese Banks.

## 2017-09-01 NOTE — Progress Notes (Signed)
Patient did not take steroid at home, order per MD for today.

## 2017-09-02 ENCOUNTER — Telehealth: Payer: Self-pay | Admitting: Oncology

## 2017-09-02 ENCOUNTER — Telehealth: Payer: Self-pay | Admitting: *Deleted

## 2017-09-02 ENCOUNTER — Other Ambulatory Visit: Payer: Self-pay | Admitting: *Deleted

## 2017-09-02 ENCOUNTER — Telehealth: Payer: Self-pay | Admitting: Pharmacist

## 2017-09-02 NOTE — Telephone Encounter (Signed)
Wife called to ask what doctor are they going to see at [facility] for kidney.  They got a call today about the appt and did not know where to go. I called the wife back and let her know that they will see Dr. Seward Speck on 09/10/2017 at 2:30. This is the doctor that they saw before and knows where to go .

## 2017-09-02 NOTE — Telephone Encounter (Addendum)
Oral Chemotherapy Pharmacist Encounter  Received a transferred voice mail from Adam Wall. She was asking about refills for Adam Wall' Revlimid. He has just started his last week of his Revlimid and she knows he is to have a week off from his Revlimid after this week. I informed her that, generally for Revlimid, during that week off his pharmacy will request a refill and a refill will be sent in then if it is appropriate for the medication to be continued.   Adam Wall, PharmD, BCPS Hematology/Oncology Clinical Pharmacist [facility] [phone]  09/02/2017 9:15 AM

## 2017-09-03 ENCOUNTER — Other Ambulatory Visit: Payer: Self-pay | Admitting: Radiology

## 2017-09-03 ENCOUNTER — Other Ambulatory Visit: Payer: Medicare Other

## 2017-09-03 ENCOUNTER — Ambulatory Visit: Payer: Medicare Other

## 2017-09-04 ENCOUNTER — Inpatient Hospital Stay: Payer: Medicare Other

## 2017-09-04 ENCOUNTER — Other Ambulatory Visit: Payer: Self-pay

## 2017-09-04 ENCOUNTER — Emergency Department: Payer: Medicare Other

## 2017-09-04 ENCOUNTER — Ambulatory Visit
Admission: RE | Admit: 2017-09-04 | Discharge: 2017-09-04 | Disposition: A | Discharge status: Home / Self Care | Payer: Medicare Other | Source: Ambulatory Visit | Attending: Oncology | Admitting: Oncology

## 2017-09-04 ENCOUNTER — Other Ambulatory Visit: Payer: Self-pay | Admitting: *Deleted

## 2017-09-04 ENCOUNTER — Encounter (HOSPITAL_COMMUNITY): Payer: Self-pay

## 2017-09-04 ENCOUNTER — Other Ambulatory Visit: Payer: Self-pay | Admitting: Oncology

## 2017-09-04 ENCOUNTER — Encounter: Payer: Self-pay | Admitting: *Deleted

## 2017-09-04 ENCOUNTER — Inpatient Hospital Stay
Admission: EM | Admit: 2017-09-04 | Discharge: 2017-09-08 | DRG: 292 | Disposition: A | Discharge status: Home / Self Care | Payer: Medicare Other | Attending: Internal Medicine | Admitting: Internal Medicine

## 2017-09-04 VITALS — BP 138/85 | HR 70 | Temp 97.0°F | Resp 18

## 2017-09-04 DIAGNOSIS — E1122 Type 2 diabetes mellitus with diabetic chronic kidney disease: Secondary | ICD-10-CM | POA: Insufficient documentation

## 2017-09-04 DIAGNOSIS — D509 Iron deficiency anemia, unspecified: Secondary | ICD-10-CM | POA: Diagnosis not present

## 2017-09-04 DIAGNOSIS — Z951 Presence of aortocoronary bypass graft: Secondary | ICD-10-CM

## 2017-09-04 DIAGNOSIS — Z7982 Long term (current) use of aspirin: Secondary | ICD-10-CM

## 2017-09-04 DIAGNOSIS — Z85528 Personal history of other malignant neoplasm of kidney: Secondary | ICD-10-CM | POA: Diagnosis not present

## 2017-09-04 DIAGNOSIS — E119 Type 2 diabetes mellitus without complications: Secondary | ICD-10-CM | POA: Diagnosis not present

## 2017-09-04 DIAGNOSIS — R0600 Dyspnea, unspecified: Secondary | ICD-10-CM

## 2017-09-04 DIAGNOSIS — E861 Hypovolemia: Secondary | ICD-10-CM | POA: Diagnosis present

## 2017-09-04 DIAGNOSIS — N184 Chronic kidney disease, stage 4 (severe): Secondary | ICD-10-CM | POA: Diagnosis present

## 2017-09-04 DIAGNOSIS — N138 Other obstructive and reflux uropathy: Secondary | ICD-10-CM | POA: Diagnosis present

## 2017-09-04 DIAGNOSIS — N189 Chronic kidney disease, unspecified: Secondary | ICD-10-CM | POA: Insufficient documentation

## 2017-09-04 DIAGNOSIS — C9000 Multiple myeloma not having achieved remission: Secondary | ICD-10-CM

## 2017-09-04 DIAGNOSIS — R197 Diarrhea, unspecified: Secondary | ICD-10-CM | POA: Diagnosis not present

## 2017-09-04 DIAGNOSIS — D563 Thalassemia minor: Secondary | ICD-10-CM | POA: Diagnosis not present

## 2017-09-04 DIAGNOSIS — R0601 Orthopnea: Secondary | ICD-10-CM | POA: Diagnosis not present

## 2017-09-04 DIAGNOSIS — R339 Retention of urine, unspecified: Secondary | ICD-10-CM | POA: Diagnosis not present

## 2017-09-04 DIAGNOSIS — Z95810 Presence of automatic (implantable) cardiac defibrillator: Secondary | ICD-10-CM | POA: Diagnosis not present

## 2017-09-04 DIAGNOSIS — N401 Enlarged prostate with lower urinary tract symptoms: Secondary | ICD-10-CM | POA: Diagnosis present

## 2017-09-04 DIAGNOSIS — E871 Hypo-osmolality and hyponatremia: Secondary | ICD-10-CM | POA: Diagnosis present

## 2017-09-04 DIAGNOSIS — F17200 Nicotine dependence, unspecified, uncomplicated: Secondary | ICD-10-CM | POA: Diagnosis present

## 2017-09-04 DIAGNOSIS — I447 Left bundle-branch block, unspecified: Secondary | ICD-10-CM

## 2017-09-04 DIAGNOSIS — D6959 Other secondary thrombocytopenia: Secondary | ICD-10-CM

## 2017-09-04 DIAGNOSIS — Z905 Acquired absence of kidney: Secondary | ICD-10-CM | POA: Diagnosis not present

## 2017-09-04 DIAGNOSIS — D631 Anemia in chronic kidney disease: Secondary | ICD-10-CM | POA: Diagnosis present

## 2017-09-04 DIAGNOSIS — M4856XA Collapsed vertebra, not elsewhere classified, lumbar region, initial encounter for fracture: Secondary | ICD-10-CM

## 2017-09-04 DIAGNOSIS — R739 Hyperglycemia, unspecified: Secondary | ICD-10-CM | POA: Diagnosis not present

## 2017-09-04 DIAGNOSIS — N179 Acute kidney failure, unspecified: Secondary | ICD-10-CM | POA: Diagnosis present

## 2017-09-04 DIAGNOSIS — E878 Other disorders of electrolyte and fluid balance, not elsewhere classified: Secondary | ICD-10-CM | POA: Diagnosis not present

## 2017-09-04 DIAGNOSIS — Z510 Encounter for antineoplastic radiation therapy: Secondary | ICD-10-CM | POA: Diagnosis not present

## 2017-09-04 DIAGNOSIS — Z794 Long term (current) use of insulin: Secondary | ICD-10-CM

## 2017-09-04 DIAGNOSIS — Z87891 Personal history of nicotine dependence: Secondary | ICD-10-CM | POA: Diagnosis not present

## 2017-09-04 DIAGNOSIS — I5030 Unspecified diastolic (congestive) heart failure: Secondary | ICD-10-CM | POA: Diagnosis present

## 2017-09-04 DIAGNOSIS — Z79899 Other long term (current) drug therapy: Secondary | ICD-10-CM | POA: Insufficient documentation

## 2017-09-04 DIAGNOSIS — R110 Nausea: Secondary | ICD-10-CM

## 2017-09-04 DIAGNOSIS — Z5112 Encounter for antineoplastic immunotherapy: Secondary | ICD-10-CM | POA: Diagnosis present

## 2017-09-04 DIAGNOSIS — G8929 Other chronic pain: Secondary | ICD-10-CM | POA: Diagnosis present

## 2017-09-04 DIAGNOSIS — R0602 Shortness of breath: Secondary | ICD-10-CM | POA: Diagnosis not present

## 2017-09-04 DIAGNOSIS — R531 Weakness: Secondary | ICD-10-CM

## 2017-09-04 DIAGNOSIS — E785 Hyperlipidemia, unspecified: Secondary | ICD-10-CM | POA: Diagnosis present

## 2017-09-04 DIAGNOSIS — D696 Thrombocytopenia, unspecified: Secondary | ICD-10-CM | POA: Diagnosis present

## 2017-09-04 DIAGNOSIS — N19 Unspecified kidney failure: Secondary | ICD-10-CM

## 2017-09-04 DIAGNOSIS — M549 Dorsalgia, unspecified: Secondary | ICD-10-CM | POA: Diagnosis present

## 2017-09-04 DIAGNOSIS — Z538 Procedure and treatment not carried out for other reasons: Secondary | ICD-10-CM | POA: Insufficient documentation

## 2017-09-04 DIAGNOSIS — M8458XA Pathological fracture in neoplastic disease, other specified site, initial encounter for fracture: Secondary | ICD-10-CM

## 2017-09-04 DIAGNOSIS — R011 Cardiac murmur, unspecified: Secondary | ICD-10-CM | POA: Diagnosis not present

## 2017-09-04 DIAGNOSIS — M545 Low back pain: Secondary | ICD-10-CM

## 2017-09-04 DIAGNOSIS — Z79891 Long term (current) use of opiate analgesic: Secondary | ICD-10-CM

## 2017-09-04 DIAGNOSIS — D630 Anemia in neoplastic disease: Secondary | ICD-10-CM | POA: Diagnosis not present

## 2017-09-04 DIAGNOSIS — R1310 Dysphagia, unspecified: Secondary | ICD-10-CM

## 2017-09-04 DIAGNOSIS — G893 Neoplasm related pain (acute) (chronic): Secondary | ICD-10-CM | POA: Diagnosis not present

## 2017-09-04 DIAGNOSIS — I130 Hypertensive heart and chronic kidney disease with heart failure and stage 1 through stage 4 chronic kidney disease, or unspecified chronic kidney disease: Secondary | ICD-10-CM

## 2017-09-04 DIAGNOSIS — I509 Heart failure, unspecified: Secondary | ICD-10-CM

## 2017-09-04 DIAGNOSIS — E1140 Type 2 diabetes mellitus with diabetic neuropathy, unspecified: Secondary | ICD-10-CM | POA: Insufficient documentation

## 2017-09-04 DIAGNOSIS — R4702 Dysphasia: Secondary | ICD-10-CM | POA: Diagnosis present

## 2017-09-04 DIAGNOSIS — R338 Other retention of urine: Secondary | ICD-10-CM | POA: Diagnosis present

## 2017-09-04 DIAGNOSIS — S32059A Unspecified fracture of fifth lumbar vertebra, initial encounter for closed fracture: Secondary | ICD-10-CM

## 2017-09-04 DIAGNOSIS — I129 Hypertensive chronic kidney disease with stage 1 through stage 4 chronic kidney disease, or unspecified chronic kidney disease: Secondary | ICD-10-CM | POA: Diagnosis not present

## 2017-09-04 DIAGNOSIS — M5489 Other dorsalgia: Secondary | ICD-10-CM | POA: Diagnosis not present

## 2017-09-04 DIAGNOSIS — M8448XA Pathological fracture, other site, initial encounter for fracture: Secondary | ICD-10-CM | POA: Diagnosis present

## 2017-09-04 DIAGNOSIS — G629 Polyneuropathy, unspecified: Secondary | ICD-10-CM | POA: Diagnosis not present

## 2017-09-04 LAB — URINALYSIS, COMPLETE (UACMP) WITH MICROSCOPIC
Bilirubin Urine: NEGATIVE
Glucose, UA: 500 mg/dL — AB
HGB URINE DIPSTICK: NEGATIVE
Ketones, ur: NEGATIVE mg/dL
NITRITE: NEGATIVE
Protein, ur: 30 mg/dL — AB
Squamous Epithelial / LPF: NONE SEEN
pH: 5.5 (ref 5.0–8.0)

## 2017-09-04 LAB — CBC WITH DIFFERENTIAL/PLATELET
BASOS ABS: 0 10*3/uL (ref 0–0.1)
Basophils Relative: 0 %
EOS ABS: 0 10*3/uL (ref 0–0.7)
Eosinophils Relative: 0 %
HCT: 27.3 % — ABNORMAL LOW (ref 40.0–52.0)
Hemoglobin: 8.8 g/dL — ABNORMAL LOW (ref 13.0–18.0)
LYMPHS PCT: 3 %
Lymphs Abs: 0.3 10*3/uL — ABNORMAL LOW (ref 1.0–3.6)
MCH: 22.8 pg — ABNORMAL LOW (ref 26.0–34.0)
MCHC: 32.2 g/dL (ref 32.0–36.0)
MCV: 70.8 fL — ABNORMAL LOW (ref 80.0–100.0)
Monocytes Absolute: 0.3 10*3/uL (ref 0.2–1.0)
Monocytes Relative: 3 %
Neutro Abs: 8.2 10*3/uL — ABNORMAL HIGH (ref 1.4–6.5)
Neutrophils Relative %: 94 %
Platelets: 46 10*3/uL — ABNORMAL LOW (ref 150–440)
RBC: 3.86 MIL/uL — AB (ref 4.40–5.90)
RDW: 18.4 % — ABNORMAL HIGH (ref 11.5–14.5)
WBC: 8.8 10*3/uL (ref 3.8–10.6)

## 2017-09-04 LAB — COMPREHENSIVE METABOLIC PANEL
ALT: 54 U/L (ref 17–63)
AST: 18 U/L (ref 15–41)
Albumin: 2.9 g/dL — ABNORMAL LOW (ref 3.5–5.0)
Alkaline Phosphatase: 81 U/L (ref 38–126)
Anion gap: 6 (ref 5–15)
BILIRUBIN TOTAL: 0.9 mg/dL (ref 0.3–1.2)
BUN: 110 mg/dL — AB (ref 6–20)
CO2: 22 mmol/L (ref 22–32)
CREATININE: 3.07 mg/dL — AB (ref 0.61–1.24)
Calcium: 7.6 mg/dL — ABNORMAL LOW (ref 8.9–10.3)
Chloride: 105 mmol/L (ref 101–111)
GFR, EST AFRICAN AMERICAN: 22 mL/min — AB (ref >60)
GFR, EST NON AFRICAN AMERICAN: 19 mL/min — AB (ref >60)
Glucose, Bld: 467 mg/dL — ABNORMAL HIGH (ref 65–99)
Potassium: 4.9 mmol/L (ref 3.5–5.1)
Sodium: 133 mmol/L — ABNORMAL LOW (ref 135–145)
TOTAL PROTEIN: 6.5 g/dL (ref 6.5–8.1)

## 2017-09-04 LAB — PROTIME-INR
INR: 1.05
PROTHROMBIN TIME: 13.6 s (ref 11.4–15.2)

## 2017-09-04 LAB — CBC
HCT: 28.5 % — ABNORMAL LOW (ref 40.0–52.0)
Hemoglobin: 8.9 g/dL — ABNORMAL LOW (ref 13.0–18.0)
MCH: 22.3 pg — ABNORMAL LOW (ref 26.0–34.0)
MCHC: 31.4 g/dL — ABNORMAL LOW (ref 32.0–36.0)
MCV: 71.1 fL — ABNORMAL LOW (ref 80.0–100.0)
PLATELETS: 47 10*3/uL — AB (ref 150–440)
RBC: 4.01 MIL/uL — AB (ref 4.40–5.90)
RDW: 18.7 % — ABNORMAL HIGH (ref 11.5–14.5)
WBC: 8.6 10*3/uL (ref 3.8–10.6)

## 2017-09-04 LAB — TROPONIN I: Troponin I: <0.03 ng/mL (ref <0.03)

## 2017-09-04 LAB — GLUCOSE, CAPILLARY
GLUCOSE-CAPILLARY: 462 mg/dL — AB (ref 65–99)
GLUCOSE-CAPILLARY: 439 mg/dL — AB (ref 65–99)
GLUCOSE-CAPILLARY: 398 mg/dL — AB (ref 65–99)
Glucose-Capillary: 466 mg/dL — ABNORMAL HIGH (ref 65–99)

## 2017-09-04 LAB — PATHOLOGIST SMEAR REVIEW

## 2017-09-04 LAB — BRAIN NATRIURETIC PEPTIDE: B NATRIURETIC PEPTIDE 5: 245 pg/mL — AB (ref 0.0–100.0)

## 2017-09-04 MED ORDER — ONDANSETRON HCL 4 MG/2ML IJ SOLN
4.0000 mg | Freq: Four times a day (QID) | INTRAMUSCULAR | Status: DC | PRN
  Administered 2017-09-04: 4 mg via INTRAVENOUS
  Filled 2017-09-04: qty 2

## 2017-09-04 MED ORDER — ACYCLOVIR 400 MG PO TABS
400.0000 mg | ORAL_TABLET | Freq: Every day | ORAL | Status: DC
  Filled 2017-09-04: qty 1

## 2017-09-04 MED ORDER — SODIUM CHLORIDE 0.9 % IV SOLN
INTRAVENOUS | Status: DC
  Administered 2017-09-04: 21:00:00 via INTRAVENOUS

## 2017-09-04 MED ORDER — CARVEDILOL 25 MG PO TABS
25.0000 mg | ORAL_TABLET | Freq: Two times a day (BID) | ORAL | Status: DC
  Administered 2017-09-05 – 2017-09-08 (×7): 25 mg via ORAL
  Filled 2017-09-04 (×7): qty 1

## 2017-09-04 MED ORDER — LIDOCAINE HCL 2 % EX GEL
1.0000 "application " | Freq: Once | CUTANEOUS | Status: AC
  Administered 2017-09-04: 1 via URETHRAL

## 2017-09-04 MED ORDER — VITAMIN C 500 MG PO TABS
500.0000 mg | ORAL_TABLET | Freq: Every day | ORAL | Status: DC
  Administered 2017-09-05 – 2017-09-07 (×3): 500 mg via ORAL
  Filled 2017-09-04 (×4): qty 1

## 2017-09-04 MED ORDER — MORPHINE SULFATE 2 MG/ML IJ SOLN
5.0000 mg | Freq: Once | INTRAMUSCULAR | Status: AC
  Administered 2017-09-04: 5 mg via INTRAVENOUS
  Filled 2017-09-04: qty 3

## 2017-09-04 MED ORDER — ASPIRIN EC 81 MG PO TBEC
81.0000 mg | DELAYED_RELEASE_TABLET | Freq: Every day | ORAL | Status: DC
  Administered 2017-09-05 – 2017-09-07 (×3): 81 mg via ORAL
  Filled 2017-09-04 (×3): qty 1

## 2017-09-04 MED ORDER — SODIUM CHLORIDE 0.9 % IV SOLN
Freq: Once | INTRAVENOUS | Status: AC
Start: 2017-09-04 — End: 2017-09-04
  Administered 2017-09-04: 10:00:00 via INTRAVENOUS
  Filled 2017-09-04: qty 1000

## 2017-09-04 MED ORDER — OXYCODONE HCL 5 MG PO TABS: 5.0000 mg | ORAL_TABLET | Freq: Four times a day (QID) | ORAL | Status: DC | PRN

## 2017-09-04 MED ORDER — ACETAMINOPHEN 325 MG PO TABS: 650.0000 mg | ORAL_TABLET | Freq: Four times a day (QID) | ORAL | Status: DC | PRN

## 2017-09-04 MED ORDER — HEPARIN SODIUM (PORCINE) 5000 UNIT/ML IJ SOLN
5000.0000 [IU] | Freq: Three times a day (TID) | INTRAMUSCULAR | Status: DC
  Administered 2017-09-04 – 2017-09-05 (×4): 5000 [IU] via SUBCUTANEOUS
  Filled 2017-09-04 (×5): qty 1

## 2017-09-04 MED ORDER — INSULIN REGULAR HUMAN 100 UNIT/ML IJ SOLN
15.0000 [IU] | Freq: Once | INTRAMUSCULAR | Status: AC
  Administered 2017-09-04: 15 [IU] via INTRAVENOUS
  Filled 2017-09-04: qty 0.15

## 2017-09-04 MED ORDER — TAMSULOSIN HCL 0.4 MG PO CAPS
0.4000 mg | ORAL_CAPSULE | Freq: Every day | ORAL | Status: DC
  Administered 2017-09-05 – 2017-09-07 (×3): 0.4 mg via ORAL
  Filled 2017-09-04 (×3): qty 1

## 2017-09-04 MED ORDER — CEFAZOLIN SODIUM-DEXTROSE 2-4 GM/100ML-% IV SOLN
INTRAVENOUS | Status: AC
  Filled 2017-09-04: qty 100

## 2017-09-04 MED ORDER — MIDAZOLAM HCL 5 MG/5ML IJ SOLN
INTRAMUSCULAR | Status: AC
  Filled 2017-09-04: qty 5

## 2017-09-04 MED ORDER — VITAMIN B-12 1000 MCG PO TABS
1000.0000 ug | ORAL_TABLET | Freq: Every day | ORAL | Status: DC
  Administered 2017-09-05 – 2017-09-07 (×2): 1000 ug via ORAL
  Filled 2017-09-04 (×3): qty 1

## 2017-09-04 MED ORDER — SODIUM CHLORIDE 0.9 % IV SOLN: INTRAVENOUS | Status: DC

## 2017-09-04 MED ORDER — PROCHLORPERAZINE MALEATE 10 MG PO TABS: 10.0000 mg | ORAL_TABLET | Freq: Once | ORAL | Status: DC

## 2017-09-04 MED ORDER — FENTANYL CITRATE (PF) 100 MCG/2ML IJ SOLN
INTRAMUSCULAR | Status: AC
  Filled 2017-09-04: qty 4

## 2017-09-04 MED ORDER — INSULIN REGULAR HUMAN 100 UNIT/ML IJ SOLN
10.0000 [IU] | Freq: Once | INTRAMUSCULAR | Status: AC
  Administered 2017-09-04: 10 [IU] via INTRAVENOUS
  Filled 2017-09-04: qty 0.1

## 2017-09-04 MED ORDER — INSULIN ASPART 100 UNIT/ML ~~LOC~~ SOLN: 0.0000 [IU] | Freq: Three times a day (TID) | SUBCUTANEOUS | Status: DC

## 2017-09-04 MED ORDER — INSULIN ASPART 100 UNIT/ML ~~LOC~~ SOLN
0.0000 [IU] | Freq: Three times a day (TID) | SUBCUTANEOUS | Status: DC
  Administered 2017-09-05 (×2): 7 [IU] via SUBCUTANEOUS
  Administered 2017-09-05: 9 [IU] via SUBCUTANEOUS
  Administered 2017-09-05: 7 [IU] via SUBCUTANEOUS
  Administered 2017-09-06: 3 [IU] via SUBCUTANEOUS
  Administered 2017-09-06: 5 [IU] via SUBCUTANEOUS
  Administered 2017-09-06: 2 [IU] via SUBCUTANEOUS
  Administered 2017-09-06: 5 [IU] via SUBCUTANEOUS
  Administered 2017-09-07: 2 [IU] via SUBCUTANEOUS
  Administered 2017-09-07 (×3): 3 [IU] via SUBCUTANEOUS
  Administered 2017-09-08 (×2): 2 [IU] via SUBCUTANEOUS
  Filled 2017-09-04 (×14): qty 1

## 2017-09-04 MED ORDER — ACETAMINOPHEN 650 MG RE SUPP: 650.0000 mg | Freq: Four times a day (QID) | RECTAL | Status: DC | PRN

## 2017-09-04 MED ORDER — DEXAMETHASONE SODIUM PHOSPHATE 10 MG/ML IJ SOLN
10.0000 mg | Freq: Once | INTRAMUSCULAR | Status: AC
  Administered 2017-09-04: 10 mg via INTRAVENOUS
  Filled 2017-09-04: qty 1

## 2017-09-04 MED ORDER — LIDOCAINE HCL 2 % EX GEL
CUTANEOUS | Status: AC
  Filled 2017-09-04: qty 10

## 2017-09-04 MED ORDER — ONDANSETRON HCL 4 MG/2ML IJ SOLN
8.0000 mg | Freq: Once | INTRAMUSCULAR | Status: AC
  Administered 2017-09-04: 8 mg via INTRAVENOUS
  Filled 2017-09-04: qty 4

## 2017-09-04 MED ORDER — ATORVASTATIN CALCIUM 20 MG PO TABS
40.0000 mg | ORAL_TABLET | Freq: Every day | ORAL | Status: DC
  Administered 2017-09-04 – 2017-09-07 (×4): 40 mg via ORAL
  Filled 2017-09-04 (×4): qty 2

## 2017-09-04 MED ORDER — CEFAZOLIN SODIUM-DEXTROSE 2-4 GM/100ML-% IV SOLN: 2.0000 g | INTRAVENOUS | Status: DC

## 2017-09-04 MED ORDER — PANTOPRAZOLE SODIUM 40 MG IV SOLR
40.0000 mg | Freq: Two times a day (BID) | INTRAVENOUS | Status: DC
  Administered 2017-09-04 – 2017-09-08 (×8): 40 mg via INTRAVENOUS
  Filled 2017-09-04 (×8): qty 40

## 2017-09-04 MED ORDER — BORTEZOMIB CHEMO SQ INJECTION 3.5 MG (2.5MG/ML)
1.3000 mg/m2 | Freq: Once | INTRAMUSCULAR | Status: AC
  Administered 2017-09-04: 2.75 mg via SUBCUTANEOUS
  Filled 2017-09-04: qty 2.75

## 2017-09-04 MED ORDER — INSULIN ASPART 100 UNIT/ML ~~LOC~~ SOLN: 0.0000 [IU] | Freq: Every day | SUBCUTANEOUS | Status: DC

## 2017-09-04 MED ORDER — FENTANYL 12 MCG/HR TD PT72
12.5000 ug | MEDICATED_PATCH | TRANSDERMAL | Status: DC
  Administered 2017-09-07: 12.5 ug via TRANSDERMAL
  Filled 2017-09-04: qty 1

## 2017-09-04 MED ORDER — ONDANSETRON HCL 40 MG/20ML IJ SOLN: Freq: Once | INTRAMUSCULAR | Status: DC

## 2017-09-04 MED ORDER — HYDRALAZINE HCL 25 MG PO TABS
25.0000 mg | ORAL_TABLET | Freq: Three times a day (TID) | ORAL | Status: DC
  Administered 2017-09-04 – 2017-09-07 (×10): 25 mg via ORAL
  Filled 2017-09-04 (×10): qty 1

## 2017-09-04 MED ORDER — SODIUM CHLORIDE 0.9 % IV SOLN: 10.0000 mg | Freq: Once | INTRAVENOUS | Status: DC

## 2017-09-04 NOTE — ED Triage Notes (Signed)
Per Abbott Pao from [place], patient was being seen in [place] for Kyphoplasty and the MD felt the patient was too unstable with edema and weakness for the procedure. Patient was sent here to be evaluated for admission.

## 2017-09-04 NOTE — ED Notes (Signed)
Patient repositioned on stretcher with assist of 3 staff members.

## 2017-09-04 NOTE — Consult Note (Signed)
Hematology/Oncology Consult note [facility] Telephone:([phone] [phone]  Patient Care Team: [name], MD as PCP - General (Pediatrics)   Name of the patient: Adam Wall  [id number]  1946/01/18    Reason for consult: multiple myeloma   Requesting physician: Dr. Cinda Quest  Date of visit: 09/04/2017    History of presenting illness-patient is a 72 year old male who was recently diagnosed with multiple myeloma.  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  As a part of the smoldering multiple myeloma workup patient had a PET/CT scan which incidentally showed a right renal mass.  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  Biopsy was subsequently complicated by bleeding complications and ultimately patient underwent right radical nephrectomy in September 2018.  Post surgery patient developed worsening renal functions and his creatinine was around 3.5.  Prior to surgery his creatinine was around 1.3-1.4.  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  He did have evidence of iron deficiency in the past which was subsequently corrected).  Post nephrectomy his hemoglobin dropped down to 7.2.  Myeloma labs from 07/30/2017 showed elevated IgG of 4053 up from 2797.  M protein went up from 2.2-3.1 g.  Free light chains were elevated at 1030 from 271 from 6 months prior.  Kappa lambda light chain ratio elevated at 48.37 from a prior value of 18.71.  Patient had a repeat bone marrow biopsy which was consistent with overt multiple myeloma given that he had 80% plasma cells noted on the bone marrow.   Patient was started on renally dosed Revlimid 15 mg every 48 hours along with Velcade given subcutaneously and day 1,4,8 and 11.  Patient received his day 11 dose of Velcade today.  He had also been complaining of worsening back pain and  PET/CT scan showed pathologic fracture of his L5 vertebral body  He was started on fentanyl patch and as needed oxycodone for his back pain.  Plan was to proceed with kyphoplasty followed by palliative radiation to his spine.  After receiving his Velcade at [facility] today patient proceeded to get his kyphoplasty.  However he was noted to have worsening nausea and dry heaves and was also looking edematous.  I received a call from the IR and given his symptoms of nausea or dry heaves as well as hiccups I was concerned that patient is going into uremic renal failure and probably needs dialysis and asked him to come to the ER.  Patient reports that his urine output has decreased significantly over the last 1 week and since the last 24 hours he has produced about 2-300 cc of urine. He has not had any urine output since 6 am this morning Patient has seen [facility] nephrology since his right nephrectomy and was supposed to follow-up with them next week     ECOG PS- 3  Pain scale- 5   Review of systems- Review of Systems  Constitutional: Positive for malaise/fatigue. Negative for chills, fever and weight loss.  HENT: Negative for congestion, ear discharge and nosebleeds.   Eyes: Negative for blurred vision.  Respiratory: Positive for shortness of breath. Negative for cough, hemoptysis, sputum production and wheezing.   Cardiovascular: Negative for chest pain, palpitations, orthopnea and claudication.  Gastrointestinal: Negative for abdominal pain, blood in stool, constipation, diarrhea, heartburn, melena, nausea and vomiting.  Genitourinary: Negative for dysuria, flank pain, frequency, hematuria and urgency.  Musculoskeletal: Positive for back pain. Negative for joint pain and myalgias.  Skin: Negative for rash.  Neurological: Positive for weakness. Negative for dizziness, tingling, focal weakness, seizures and headaches.  Endo/Heme/Allergies: Does not bruise/bleed easily.  Psychiatric/Behavioral: Negative for  depression and suicidal ideas. The patient does not have insomnia.     No Known Allergies  Patient Active Problem List   Diagnosis Date Noted  . Goals of care, counseling/discussion 08/18/2017  . Multiple myeloma not having achieved remission ([place]) 08/18/2017  . Multiple myeloma ([place]) 08/18/2017  . Adenopathy   . SOBOE (shortness of breath on exertion) 03/13/2017  . Renal mass 03/03/2017  . Smoldering myeloma ([place]) 02/26/2017  . Iron deficiency anemia 02/22/2017  . Atherosclerosis of native artery of both lower extremities with intermittent claudication ([place]) 02/21/2017  . Atherosclerosis of native arteries of extremity with intermittent claudication ([place]) 07/08/2016  . Hyperlipidemia 07/08/2016  . Coronary atherosclerosis 07/08/2016  . Diabetes ([place]) 07/08/2016  . Bilateral leg edema 05/09/2015  . Biventricular ICD (implantable cardioverter-defibrillator) in place 05/09/2015  . Biventricular implantable cardioverter-defibrillator in situ 04/12/2015  . Acute combined systolic and diastolic congestive heart failure ([place]) 04/11/2015  . Essential hypertension 03/30/2015  . Left carotid bruit 03/30/2015  . Microcytic anemia 03/30/2015  . Weak pulse 03/30/2015  . Mitral valve insufficiency 03/22/2015  . Moderate mitral insufficiency 03/22/2015  . Benign essential hypertension 03/02/2015  . Chronic systolic CHF (congestive heart failure), NYHA class 2 ([place]) 10/25/2014  . Chronic systolic heart failure ([place]) 10/25/2014  . Acute kidney injury (nontraumatic) ([place]) 10/18/2014  . Complicated UTI (urinary tract infection) 10/18/2014  . Pyelonephritis 10/18/2014  . Congestive cardiomyopathy ([place]) 05/11/2014  . Colon polyp 01/01/2013  . Benign prostatic hyperplasia without lower urinary tract symptoms 06/12/2012  . Elevated prostate specific antigen (PSA) 06/12/2012  . Chronic anemia 03/18/2012  . Referral of patient 03/18/2012  . Sepsis due to urinary tract infection ([place]) 03/18/2012  .  Sepsis secondary to UTI ([place]) 03/18/2012  . Elevated PSA 03/17/2012  . Hypertension 03/17/2012  . LBBB (left bundle branch block) 03/17/2012  . Left bundle branch block (LBBB) 03/17/2012  . Microalbuminuria 03/17/2012  . Retinopathy 03/17/2012  . Atherosclerosis 05/28/2011     Past Medical History:  Diagnosis Date  . AICD (automatic cardioverter/defibrillator) present   . Anemia   . Blockage of coronary artery of heart ([place])   . CHF (congestive heart failure) ([place])   . Chronic kidney disease 05/22/2017   nephrectomy-right  . Diabetes mellitus without complication ([place])   . Dysrhythmia   . Heart murmur   . Hypertension   . Hypochloremia   . LBBB (left bundle branch block)   . Neuropathy   . Presence of permanent cardiac pacemaker   . Right renal mass   . Smoldering myeloma [facility])      Past Surgical History:  Procedure Laterality Date  . CARDIAC SURGERY    . CORONARY ARTERY BYPASS GRAFT    . ENDOBRONCHIAL ULTRASOUND N/A 04/03/2017   Procedure: ENDOBRONCHIAL ULTRASOUND;  Surgeon: Flora Lipps, MD;  Location: [facility] ORS;  Service: Cardiopulmonary;  Laterality: N/A;  . Heart Bypass    . PACEMAKER PLACEMENT     ICD    Social History   Socioeconomic History  . Marital status: Married    Spouse name: Not on file  . Number of children: Not on file  . Years of education: Not on file  . Highest education level: Not on file  Social Needs  . Financial resource strain:  Not on file  . Food insecurity - worry: Not on file  . Food insecurity - inability: Not on file  . Transportation needs - medical: Not on file  . Transportation needs - non-medical: Not on file  Occupational History  . Not on file  Tobacco Use  . Smoking status: Former Smoker    Years: 10.00    Types: Cigars    Last attempt to quit: 10/04/1993    Years since quitting: 23.9  . Smokeless tobacco: Never Used  . Tobacco comment: 3 CIGARS PER DAY  Substance and Sexual Activity  . Alcohol use: No  . Drug use:  No  . Sexual activity: Not on file  Other Topics Concern  . Not on file  Social History Narrative  . Not on file     Family History  Problem Relation Age of Onset  . Heart failure Paternal Grandmother     No current facility-administered medications for this encounter.   Current Outpatient Medications:  .  acetaminophen (TYLENOL) 500 MG tablet, Take 1,000 mg by mouth every 8 (eight) hours as needed for mild pain or moderate pain., Disp: , Rfl:  .  acyclovir (ZOVIRAX) 400 MG tablet, Take 1 tablet (400 mg total) by mouth daily., Disp: 30 tablet, Rfl: 5 .  aspirin 81 MG tablet, Take 81 mg by mouth daily., Disp: , Rfl:  .  atorvastatin (LIPITOR) 40 MG tablet, Take 40 mg by mouth at bedtime., Disp: , Rfl:  .  carvedilol (COREG) 25 MG tablet, Take 25 mg by mouth 2 (two) times daily with a meal., Disp: , Rfl:  .  fentaNYL (DURAGESIC - DOSED MCG/HR) 12 MCG/HR, Place 1 patch (12.5 mcg total) onto the skin every 3 (three) days., Disp: 10 patch, Rfl: 0 .  furosemide (LASIX) 40 MG tablet, Take 20 mg by mouth daily., Disp: , Rfl:  .  gabapentin (NEURONTIN) 100 MG capsule, Take 100 mg by mouth daily as needed. , Disp: , Rfl:  .  hydrALAZINE (APRESOLINE) 25 MG tablet, Take 25 mg by mouth 3 (three) times daily., Disp: , Rfl:  .  insulin NPH-regular Human (NOVOLIN 70/30) (70-30) 100 UNIT/ML injection, Inject 16 Units into the skin., Disp: , Rfl:  .  isosorbide dinitrate (ISORDIL) 10 MG tablet, Take 10 mg by mouth 3 (three) times daily., Disp: , Rfl:  .  lenalidomide (REVLIMID) 15 MG capsule, Take 1 capsule (15 mg total) by mouth every other day. For 2 weeks on and 1 week off, Disp: 8 capsule, Rfl: 4 .  metFORMIN (GLUCOPHAGE) 1000 MG tablet, Take 1,000-1,500 mg by mouth 2 (two) times daily with a meal. Takes 1500 mg in the morning and 1000 mg in the evening, Disp: , Rfl:  .  oxyCODONE (OXY IR/ROXICODONE) 5 MG immediate release tablet, Take 1 tablet (5 mg total) by mouth every 6 (six) hours as needed for  severe pain., Disp: 60 tablet, Rfl: 0 .  sacubitril-valsartan (ENTRESTO) 49-51 MG, Take 1 tablet by mouth 2 (two) times daily., Disp: , Rfl:  .  sitaGLIPtin (JANUVIA) 100 MG tablet, Take 100 mg by mouth daily., Disp: , Rfl:  .  tamsulosin (FLOMAX) 0.4 MG CAPS capsule, Take 0.4 mg by mouth daily., Disp: , Rfl:  .  vitamin B-12 (CYANOCOBALAMIN) 1000 MCG tablet, Take 1,000 mcg by mouth daily., Disp: , Rfl:  .  vitamin C (ASCORBIC ACID) 500 MG tablet, Take 500 mg by mouth daily., Disp: , Rfl:   Facility-Administered Medications Ordered in Other Encounters:  .  0.9 %  sodium chloride infusion, , Intravenous, Continuous, Monia Sabal, PA-C .  ceFAZolin (ANCEF) 2-4 GM/100ML-% IVPB, , , ,  .  ceFAZolin (ANCEF) IVPB 2g/100 mL premix, 2 g, Intravenous, to Edward Jolly, [place]   Physical exam:  Vitals:   09/04/17 1520 09/04/17 1530  BP:  (!) 125/38  Pulse:  79  Resp:  18  Temp:  97.9 F (36.6 C)  TempSrc:  Oral  SpO2:  94%  Weight: 210 lb (95.3 kg)   Height: 6' (1.829 m)    Physical Exam  Constitutional: He is oriented to person, place, and time.  Appears fatigued. Face appears edematous  HENT:  Head: Normocephalic and atraumatic.  Eyes: EOM are normal. Pupils are equal, round, and reactive to light.  Neck: Normal range of motion.  Cardiovascular: Normal rate and regular rhythm.  Systolic murmur +  Pulmonary/Chest: Effort normal.  Breath sounds decreased over bases  Abdominal: Soft. Bowel sounds are normal.  Musculoskeletal: He exhibits edema (b/l +2).  Neurological: He is alert and oriented to person, place, and time.  Skin: Skin is warm and dry.       CMP Latest Ref Rng & Units 09/04/2017  Glucose 65 - 99 mg/dL 467(H)  BUN 6 - 20 mg/dL 110(H)  Creatinine 0.61 - 1.24 mg/dL 3.07(H)  Sodium 135 - 145 mmol/L 133(L)  Potassium 3.5 - 5.1 mmol/L 4.9  Chloride 101 - 111 mmol/L 105  CO2 22 - 32 mmol/L 22  Calcium 8.9 - 10.3 mg/dL 7.6(L)  Total Protein 6.5 - 8.1 g/dL 6.5    Total Bilirubin 0.3 - 1.2 mg/dL 0.9  Alkaline Phos 38 - 126 U/L 81  AST 15 - 41 U/L 18  ALT 17 - 63 U/L 54   CBC Latest Ref Rng & Units 09/04/2017  WBC 3.8 - 10.6 K/uL 8.6  Hemoglobin 13.0 - 18.0 g/dL 8.9(L)  Hematocrit 40.0 - 52.0 % 28.5(L)  Platelets 150 - 440 K/uL 47(L)    @IMAGES @  Ct Lumbar Spine Wo Contrast  Result Date: 08/28/2017 CLINICAL DATA:  Followup abnormal PET scan at L5. History of multiple myeloma. EXAM: CT LUMBAR SPINE WITHOUT CONTRAST TECHNIQUE: Multidetector CT imaging of the lumbar spine was performed without intravenous contrast administration. Multiplanar CT image reconstructions were also generated. COMPARISON:  PET scan 08/25/2017 and 10/28/2016. FINDINGS: Segmentation: 5 lumbar type vertebral bodies. Alignment: Normal Vertebrae: Old superior endplate Schmorl's nodes/fractures at L1 and L3. Acute or subacute fracture at the superior endplate of L5 with loss of height centrally of about 25%. No retropulsed bone. The bone underlying the fracture appears somewhat lucent, suggesting that there could be an underlying marrow space lesion. No extraosseous extension of tumor, if present. Lytic areas noted in both iliac bones consistent with the history of myeloma. Paraspinal and other soft tissues: No significant paraspinous soft tissue finding. Disc levels: Solid bridging osteophytes from the thoracic region through L4. Mild chronic disc bulges and facet arthritis without likely significant stenosis. IMPRESSION: Acute or subacute superior endplate fracture at L5 with loss of height centrally of 25%. Slight lucency at the bone underlying the fracture raises the possibility of an underlying marrow space lesion. Lucent areas in both iliac bones suggesting the presence of myeloma in those regions. Electronically Signed   By: [name] M.D.   On: 08/28/2017 11:19   Nm Pet Image Restage (ps) Whole Body  Result Date: 08/25/2017 CLINICAL DATA:  The the treatment strategy for tumor  type. EXAM: NUCLEAR MEDICINE PET WHOLE  BODY TECHNIQUE: mCi F-18 FDG was injected intravenously. Full-ring PET imaging was performed from the vertex to the feet after the radiotracer. CT data was obtained and used for attenuation correction and anatomic localization. FASTING BLOOD GLUCOSE:  Value:  mg/dl COMPARISON:  None. FINDINGS: HEAD/NECK No hypermetabolic activity in the scalp. No hypermetabolic cervical lymph nodes. CHEST Persistent hypermetabolic foci identified in the mediastinum and hilar regions. This is similar but slightly decreased compared to prior study. Index prevascular lymph node demonstrates SUV max = 5.4. The heart is enlarged. Left-sided pacer/AICD again noted. Mild FDG uptake at the area around the battery pack is probably related to granulation tissue. Distal esophageal uptake noted previously is less apparent today. ABDOMEN/PELVIS No abnormal hypermetabolic activity within the liver, pancreas, adrenal glands, or spleen. No hypermetabolic lymph nodes in the abdomen or pelvis. There is abdominal aortic atherosclerosis without aneurysm. Stable 16 mm low-density splenic lesion without hypermetabolic FDG accumulation. SKELETON New uptake is seen diffusely in the L5 vertebral body which has become much more heterogeneous in the interval with areas of lucency. There may be a fracture line in the vertebral body visible on image 227. Activity seen previously and T8 and T10 is not readily evident today. The bony mineralization remains diffusely heterogeneous EXTREMITIES Uptake is identified in the soft tissues posterior to the right calcaneus. This is associated with soft tissue edema in the lower legs bilaterally. IMPRESSION: 1. Interval development of hypermetabolism in the L5 vertebral body. Axial CT imaging raises the question of fracture, possibly pathologic. 2. Activity seen previously at T8 and T10 is not evident on today's exam. 3. Similar but less conspicuous hypermetabolic lymph nodes in the  mediastinum and hilar regions. 4. Focal hypermetabolism in the soft tissues posterior to the right calcaneus with diffuse soft tissue edema seen in the lower extremities bilaterally. Electronically Signed   By: Misty Stanley M.D.   On: 08/25/2017 13:50   Ct Bone Marrow Biopsy & Aspiration  Result Date: 08/20/2017 CLINICAL DATA:  Multiple myeloma. Need for bone marrow biopsy to establish status of plasma cells in the bone marrow. EXAM: CT GUIDED BONE MARROW ASPIRATION AND BIOPSY ANESTHESIA/SEDATION: Versed 3.0 mg IV, Fentanyl 75 mcg IV Total Moderate Sedation Time:  17 minutes. The patient's level of consciousness and physiologic status were continuously monitored during the procedure by Radiology nursing. PROCEDURE: The procedure risks, benefits, and alternatives were explained to the patient. Questions regarding the procedure were encouraged and answered. The patient understands and consents to the procedure. A time out was performed prior to initiating the procedure. The right gluteal region was prepped with chlorhexidine. Sterile gown and sterile gloves were used for the procedure. Local anesthesia was provided with 1% Lidocaine. Under CT guidance, an 11 gauge On Control bone cutting needle was advanced from a posterior approach into the right iliac bone. Needle positioning was confirmed with CT. Initial non heparinized and heparinized aspirate samples were obtained of bone marrow. Core biopsy was performed via the On Control drill needle. COMPLICATIONS: None FINDINGS: Inspection of initial aspirate did reveal visible particles. Intact core biopsy sample was obtained. IMPRESSION: CT guided bone marrow biopsy of right posterior iliac bone with both aspirate and core samples obtained. Electronically Signed   By: Aletta Edouard M.D.   On: 08/20/2017 09:58    Assessment and plan- Patient is a 72 y.o. 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  Given his symptoms of nausea  dry heaves and hiccups as well as with leg edema and body swelling I feel he is going into nonoliguric renal failure and needs dialysis especially for his volume overload.  He does not have any evidence of metabolic acidosis or hyperkalemia.  I have spoken to Dr. Juleen China and he has agreed to see the patient regarding his CKD and possible need for dialysis  2.  IgG multiple myeloma: Patient did receive his cycle 1 day 11 of Velcade today.  His further doses of Velcade and Revlimid will be on hold given his thrombocytopenia and platelet count of 46.  Once his platelet counts are greater than 70 I will reinitiate his Velcade.  Given the chronic kidney disease and potential to go on dialysis I will switch him from Revlimid to cybord regime likely as an outpatient once his counts improve. Patient received 1st dose of xgeva on 08/29/17  3.  Anemia: Secondary to multiple myeloma continue to monitor.    4. Thrombocytopenia secondary to Revlimid and Velcade.  Further doses will be on hold until his counts improve  5.  Microcytosis secondary to thalassemia trait.  He does not have any evidence of iron deficiency based on recent labs  6.  L5 vertebral fracture: Once his volume status stabilizes I will arrange for kyphoplasty as an outpatient.  He is also scheduled to start radiation next week  7.  Hyperglycemia: Patient did not take his insulin this morning due to schedule kyphoplasty.  He would need inpatient management for his hyperglycemia   I will continue to follow this patient while he is inpatient    Dr. Randa Evens, MD, MPH [facility] Pager- [phone] 09/04/2017 4:33 PM

## 2017-09-04 NOTE — Consult Note (Signed)
Chief Complaint: Patient was seen in consultation today for L5 kyphoplasty and radiofrequency ablation at the request of Rao,Archana C  Referring Physician(s): Rao,Archana C    Patient Status: [facility] - Out-pt  History of Present Illness: Adam Wall is a 72 y.o. male with a complex medical history including multiple myeloma and prior bone marrow biopsy. PET/CT on 08/25/2017 demonstrated hypermetabolic activity in the L5 vertebral body. CT scan on 08/28/2017 demonstrated a superior endplate L5 compression fracture with bone lucency. Findings were concerning for a pathologic fracture. Patient complains of diffuse pain but particularly in the lower back. Patient's back pain earlier today was 8/10. He received morphine at the cancer clinic earlier today now his pain is approximately 7/10. Patient is unable to ambulate without a walker. He is dependent on his wife to help him with basic daily activities. Patient was complaining of dry heaves and nausea for the last 24 hours. Patient received hydration and pain medications that the cancer clinic earlier in the day.  Past Medical History:  Diagnosis Date  . AICD (automatic cardioverter/defibrillator) present   . Anemia   . Blockage of coronary artery of heart ([place])   . CHF (congestive heart failure) ([place])   . Chronic kidney disease 05/22/2017   nephrectomy-right  . Diabetes mellitus without complication ([place])   . Dysrhythmia   . Heart murmur   . Hypertension   . Hypochloremia   . LBBB (left bundle branch block)   . Neuropathy   . Presence of permanent cardiac pacemaker   . Right renal mass   . 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 CARDIAC SURGERY    . CORONARY ARTERY BYPASS GRAFT    . ENDOBRONCHIAL ULTRASOUND N/A 04/03/2017   Procedure: ENDOBRONCHIAL ULTRASOUND;  Surgeon: Flora Lipps, MD;  Location: [facility] ORS;  Service: Cardiopulmonary;  Laterality: N/A;  . Heart Bypass    .  PACEMAKER PLACEMENT     ICD    Allergies: Patient has no known allergies.  Medications: Prior to Admission medications   Medication Sig Start Date End Date Taking? Authorizing Provider  acyclovir (ZOVIRAX) 400 MG tablet Take 1 tablet (400 mg total) by mouth daily. 08/18/17  Yes Sindy Guadeloupe, MD  aspirin 81 MG tablet Take 81 mg by mouth daily.   Yes [provider]  atorvastatin (LIPITOR) 40 MG tablet Take 40 mg by mouth at bedtime. 02/27/17  Yes [provider]  carvedilol (COREG) 25 MG tablet Take 25 mg by mouth 2 (two) times daily with a meal.   Yes [provider]  fentaNYL (DURAGESIC - DOSED MCG/HR) 12 MCG/HR Place 1 patch (12.5 mcg total) onto the skin every 3 (three) days. 08/28/17  Yes Sindy Guadeloupe, MD  furosemide (LASIX) 40 MG tablet Take 20 mg by mouth daily.   Yes [provider]  gabapentin (NEURONTIN) 100 MG capsule Take 100 mg by mouth daily as needed.    Yes [provider]  hydrALAZINE (APRESOLINE) 25 MG tablet Take 25 mg by mouth 3 (three) times daily.   Yes [provider]  insulin NPH-regular Human (NOVOLIN 70/30) (70-30) 100 UNIT/ML injection Inject 16 Units into the skin.   Yes [provider]  lenalidomide (REVLIMID) 15 MG capsule Take 1 capsule (15 mg total) by mouth every other day. For 2 weeks on and 1 week off 08/20/17 09/19/17 Yes Sindy Guadeloupe, MD  LORazepam (ATIVAN) 0.5 MG tablet Take 1 tablet (  0.5 mg total) by mouth every 6 (six) hours as needed (Nausea or vomiting). 08/18/17  Yes Sindy Guadeloupe, MD  ondansetron (ZOFRAN) 8 MG tablet Take 1 tablet (8 mg total) by mouth 2 (two) times daily as needed (Nausea or vomiting). 08/18/17  Yes Sindy Guadeloupe, MD  oxyCODONE (OXY IR/ROXICODONE) 5 MG immediate release tablet Take 1 tablet (5 mg total) by mouth every 6 (six) hours as needed for severe pain. 08/15/17  Yes Sindy Guadeloupe, MD  prochlorperazine (COMPAZINE) 10 MG tablet Take 1 tablet (10 mg total) by mouth  every 6 (six) hours as needed (Nausea or vomiting). 08/18/17  Yes Sindy Guadeloupe, MD  tamsulosin (FLOMAX) 0.4 MG CAPS capsule Take 0.4 mg by mouth daily.   Yes [provider]  vitamin B-12 (CYANOCOBALAMIN) 1000 MCG tablet Take 1,000 mcg by mouth daily.   Yes [provider]  vitamin C (ASCORBIC ACID) 500 MG tablet Take 500 mg by mouth daily.   Yes [provider]  acetaminophen (TYLENOL) 500 MG tablet Take 1,000 mg by mouth every 8 (eight) hours as needed for mild pain or moderate pain.    [provider]  isosorbide dinitrate (ISORDIL) 10 MG tablet Take 10 mg by mouth 3 (three) times daily.    [provider]  metFORMIN (GLUCOPHAGE) 1000 MG tablet Take 1,000-1,500 mg by mouth 2 (two) times daily with a meal. Takes 1500 mg in the morning and 1000 mg in the evening    [provider]  sacubitril-valsartan (ENTRESTO) 49-51 MG Take 1 tablet by mouth 2 (two) times daily.    [provider]  sitaGLIPtin (JANUVIA) 100 MG tablet Take 100 mg by mouth daily.    [provider]     Family History  Problem Relation Age of Onset  . Heart failure Paternal Grandmother     Social History   Socioeconomic History  . Marital status: Married    Spouse name: None  . Number of children: None  . Years of education: None  . Highest education level: None  Social Needs  . Financial resource strain: None  . Food insecurity - worry: None  . Food insecurity - inability: None  . Transportation needs - medical: None  . Transportation needs - non-medical: None  Occupational History  . None  Tobacco Use  . Smoking status: Former Smoker    Years: 10.00    Types: Cigars    Last attempt to quit: 10/04/1993    Years since quitting: 23.9  . Smokeless tobacco: Never Used  . Tobacco comment: 3 CIGARS PER DAY  Substance and Sexual Activity  . Alcohol use: No  . Drug use: No  . Sexual activity: None  Other Topics Concern  . None  Social  History Narrative  . None     Review of Systems  Constitutional: Positive for fatigue.  Respiratory: Negative for shortness of breath.   Cardiovascular: Positive for leg swelling. Negative for chest pain.  Gastrointestinal: Positive for nausea.  Musculoskeletal: Positive for back pain.  Neurological: Positive for weakness.    Vital Signs: BP (!) 161/62   Pulse 78   Temp 98.2 F (36.8 C) (Oral)   Resp 14   SpO2 98%   Physical Exam  Constitutional: He appears distressed.  HENT:  Eyelids of appear to be swollen with some redness on the left eyelid.  Uvula could not be well identified and concern for some soft tissue swelling in the oropharynx. Mallampati score is  3-4.  Cardiovascular: Normal rate.  Murmur heard. Systolic murmur.  Pulmonary/Chest:  Faint breath sounds bilaterally.  Abdominal: Soft. He exhibits no distension.  Musculoskeletal: He exhibits edema.  Spinous processes of the thoracic and lumbar spine were palpated. Patient is tender throughout the back but he is most tender in the lower lumbar spine region.    Imaging: Ct Lumbar Spine Wo Contrast  Result Date: 08/28/2017 CLINICAL DATA:  Followup abnormal PET scan at L5. History of multiple myeloma. EXAM: CT LUMBAR SPINE WITHOUT CONTRAST TECHNIQUE: Multidetector CT imaging of the lumbar spine was performed without intravenous contrast administration. Multiplanar CT image reconstructions were also generated. COMPARISON:  PET scan 08/25/2017 and 10/28/2016. FINDINGS: Segmentation: 5 lumbar type vertebral bodies. Alignment: Normal Vertebrae: Old superior endplate Schmorl's nodes/fractures at L1 and L3. Acute or subacute fracture at the superior endplate of L5 with loss of height centrally of about 25%. No retropulsed bone. The bone underlying the fracture appears somewhat lucent, suggesting that there could be an underlying marrow space lesion. No extraosseous extension of tumor, if present. Lytic areas noted in both iliac  bones consistent with the history of myeloma. Paraspinal and other soft tissues: No significant paraspinous soft tissue finding. Disc levels: Solid bridging osteophytes from the thoracic region through L4. Mild chronic disc bulges and facet arthritis without likely significant stenosis. IMPRESSION: Acute or subacute superior endplate fracture at L5 with loss of height centrally of 25%. Slight lucency at the bone underlying the fracture raises the possibility of an underlying marrow space lesion. Lucent areas in both iliac bones suggesting the presence of myeloma in those regions. Electronically Signed   By: [name] M.D.   On: 08/28/2017 11:19   Nm Pet Image Restage (ps) Whole Body  Result Date: 08/25/2017 CLINICAL DATA:  The the treatment strategy for tumor type. EXAM: NUCLEAR MEDICINE PET WHOLE BODY TECHNIQUE: mCi F-18 FDG was injected intravenously. Full-ring PET imaging was performed from the vertex to the feet after the radiotracer. CT data was obtained and used for attenuation correction and anatomic localization. FASTING BLOOD GLUCOSE:  Value:  mg/dl COMPARISON:  None. FINDINGS: HEAD/NECK No hypermetabolic activity in the scalp. No hypermetabolic cervical lymph nodes. CHEST Persistent hypermetabolic foci identified in the mediastinum and hilar regions. This is similar but slightly decreased compared to prior study. Index prevascular lymph node demonstrates SUV max = 5.4. The heart is enlarged. Left-sided pacer/AICD again noted. Mild FDG uptake at the area around the battery pack is probably related to granulation tissue. Distal esophageal uptake noted previously is less apparent today. ABDOMEN/PELVIS No abnormal hypermetabolic activity within the liver, pancreas, adrenal glands, or spleen. No hypermetabolic lymph nodes in the abdomen or pelvis. There is abdominal aortic atherosclerosis without aneurysm. Stable 16 mm low-density splenic lesion without hypermetabolic FDG accumulation. SKELETON New  uptake is seen diffusely in the L5 vertebral body which has become much more heterogeneous in the interval with areas of lucency. There may be a fracture line in the vertebral body visible on image 227. Activity seen previously and T8 and T10 is not readily evident today. The bony mineralization remains diffusely heterogeneous EXTREMITIES Uptake is identified in the soft tissues posterior to the right calcaneus. This is associated with soft tissue edema in the lower legs bilaterally. IMPRESSION: 1. Interval development of hypermetabolism in the L5 vertebral body. Axial CT imaging raises the question of fracture, possibly pathologic. 2. Activity seen previously at T8 and T10 is not evident on today's exam. 3. Similar but less conspicuous hypermetabolic  lymph nodes in the mediastinum and hilar regions. 4. Focal hypermetabolism in the soft tissues posterior to the right calcaneus with diffuse soft tissue edema seen in the lower extremities bilaterally. Electronically Signed   By: Misty Stanley M.D.   On: 08/25/2017 13:50   Ct Bone Marrow Biopsy & Aspiration  Result Date: 08/20/2017 CLINICAL DATA:  Multiple myeloma. Need for bone marrow biopsy to establish status of plasma cells in the bone marrow. EXAM: CT GUIDED BONE MARROW ASPIRATION AND BIOPSY ANESTHESIA/SEDATION: Versed 3.0 mg IV, Fentanyl 75 mcg IV Total Moderate Sedation Time:  17 minutes. The patient's level of consciousness and physiologic status were continuously monitored during the procedure by Radiology nursing. PROCEDURE: The procedure risks, benefits, and alternatives were explained to the patient. Questions regarding the procedure were encouraged and answered. The patient understands and consents to the procedure. A time out was performed prior to initiating the procedure. The right gluteal region was prepped with chlorhexidine. Sterile gown and sterile gloves were used for the procedure. Local anesthesia was provided with 1% Lidocaine. Under CT  guidance, an 11 gauge On Control bone cutting needle was advanced from a posterior approach into the right iliac bone. Needle positioning was confirmed with CT. Initial non heparinized and heparinized aspirate samples were obtained of bone marrow. Core biopsy was performed via the On Control drill needle. COMPLICATIONS: None FINDINGS: Inspection of initial aspirate did reveal visible particles. Intact core biopsy sample was obtained. IMPRESSION: CT guided bone marrow biopsy of right posterior iliac bone with both aspirate and core samples obtained. Electronically Signed   By: Aletta Edouard M.D.   On: 08/20/2017 09:58    Labs:  CBC: Recent Labs    08/20/17 0748 08/25/17 1323 08/29/17 1401 09/04/17 1200  WBC 4.2 4.6 5.6 8.8  HGB 8.6* 7.9* 8.8* 8.8*  HCT 26.6* 24.5* 27.1* 27.3*  PLT 128* 123* 114* 46*    COAGS: Recent Labs    10/14/16 0805 08/20/17 0748 09/04/17 1325  INR 1.01 1.11 1.05  APTT 28 31  --     BMP: Recent Labs    08/15/17 1625 08/20/17 0748 08/25/17 1323 09/04/17 1200  NA 135 134* 133* 133*  K 4.2 4.3 4.0 4.9  CL 103 103 102 105  CO2 _0 GLUCOSE 60* 128* 182* 467*  BUN 65* 63* 70* 110*  CALCIUM 9.4 9.6 9.4 7.6*  CREATININE 3.12* 3.26* 3.36* 3.07*  GFRNONAA 18* 17* 17* 19*  GFRAA 21* 20* 19* 22*    LIVER FUNCTION TESTS: Recent Labs    02/19/17 0950 07/30/17 0910 08/25/17 1323 09/04/17 1200  BILITOT 0.5 0.5 0.6 0.9  AST 18 57* 86* 18  ALT 10* 92* 145* 54  ALKPHOS 96 120 125 81  PROT 7.7 8.9* 9.3* 6.5  ALBUMIN 3.3* 3.3* 3.4* 2.9*    TUMOR MARKERS: No results for input(s): AFPTM, CEA, CA199, CHROMGRNA in the last 8760 hours.  Assessment and Plan:  72 year old male with multiple myeloma and probable pathologic compression fracture at L5. Patient was referred to interventional radiology for L5 kyphoplasty and potential radiofrequency ablation. The patient does have point tenderness in the lower lumbar spine and this corresponds with  the L5 fracture. The patient would be a candidate for L5 kyphoplasty with radiofrequency ablation. However, the patient is not a candidate for moderate sedation at this time. He appears to have swelling in the oropharynx and I do not feel comfortable sedating this patient without anesthesia. In addition, the patient is having  other medical problems including nausea and dry heaves. His glucose level is 467 today which is likely related to his steroids. Although the patient would be a candidate for L5 kyphoplasty, his medical care needs to be optimized prior to performing this procedure with moderate sedation. Otherwise, we would need to perform the procedure with anesthesia. I explained my concerns to the patient and family. I also discussed the situation with Dr. Janese Banks and she is going to evaluate the patient.  The L5 kyphoplasty has been by postponed at this time.  Thank you for this interesting consult.  I greatly enjoyed meeting Adam Wall and look forward to participating in their care.  A copy of this report was sent to the requesting provider on this date.  Electronically Signed: Burman Riis, MD 09/04/2017, 2:42 PM   I spent a total of  30 Minutes   in face to face in clinical consultation, greater than 50% of which was counseling/coordinating care for L5 compression fracture.

## 2017-09-04 NOTE — OR Nursing (Signed)
Dr Anselm Pancoast discussed pt status with Dr Janese Banks. Dr Janese Banks aggred with admitting pt for stabilization before Kyphoplasty. Pt fsbs 462. Airway swollen and not safe for moderate sedation per Dr Anselm Pancoast. Plan to send to ER for evaluation for possible admission.

## 2017-09-04 NOTE — ED Notes (Signed)
Bladder scan showed 959ml. Dr. Cinda Quest aware.

## 2017-09-04 NOTE — Progress Notes (Signed)
Called Dr. Jannifer Franklin regarding patient's blood sugar- 439.  Doctor placed order for 10 units of humulin.  Gave humulin and rechecked blood sugar and it was 398.  Spoke with doctor again and he placed another order for 15 units and will recheck in an hour.  Phoebe Sharps N  11:25 PM  09/04/2017

## 2017-09-04 NOTE — ED Provider Notes (Addendum)
[facility] Emergency Department Provider Note   ____________________________________________   First MD Initiated Contact with Patient 09/04/17 1510     (approximate)  I have reviewed the triage vital signs and the nursing notes.   HISTORY  Chief Complaint Back Pain    HPI Adam Wall is a 72 y.o. male Who has multiple myeloma and was supposed to get kyphoplasty today but he was noticed by radiology to be somewhat "puffy" unable worried if he laid down he might arrest. He does have a history of congestive failure but has an ejection fraction of 50-60%. He has stage III renal disease and does have some edema in his legs and some puffiness around his left eye. Patient is having a lot of back pain where he has a compressed vertebrae.oncology comes by to see him. He has not urinated since this morning's was not urinating much yesterday. Oncology had paged renal to come to see the patient evaluated for dialysis with renal has not come yet. Patient also has a history of bladder obstruction and I have asked for bladder scan. patient does report chronic shortness of breath and says he chronically cannot lay down or walk more than about 10 feet without having shortness of breath.  Past Medical History:  Diagnosis Date  . AICD (automatic cardioverter/defibrillator) present   . Anemia   . Blockage of coronary artery of heart ([place])   . CHF (congestive heart failure) ([place])   . Chronic kidney disease 05/22/2017   nephrectomy-right  . Diabetes mellitus without complication ([place])   . Dysrhythmia   . Heart murmur   . Hypertension   . Hypochloremia   . LBBB (left bundle branch block)   . Neuropathy   . Presence of permanent cardiac pacemaker   . Right renal mass   . 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 Goals of care, counseling/discussion 08/18/2017  . Multiple myeloma not having achieved remission ([place])  08/18/2017  . Multiple myeloma ([place]) 08/18/2017  . Adenopathy   . SOBOE (shortness of breath on exertion) 03/13/2017  . Renal mass 03/03/2017  . 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 Iron deficiency anemia 02/22/2017  . Atherosclerosis of native artery of both lower extremities with intermittent claudication ([place]) 02/21/2017  . Atherosclerosis of native arteries of extremity with intermittent claudication ([place]) 07/08/2016  . Hyperlipidemia 07/08/2016  . Coronary atherosclerosis 07/08/2016  . Diabetes ([place]) 07/08/2016  . Bilateral leg edema 05/09/2015  . Biventricular ICD (implantable cardioverter-defibrillator) in place 05/09/2015  . Biventricular implantable cardioverter-defibrillator in situ 04/12/2015  . Acute combined systolic and diastolic congestive heart failure ([place]) 04/11/2015  . Essential hypertension 03/30/2015  . Left carotid bruit 03/30/2015  . Microcytic anemia 03/30/2015  . Weak pulse 03/30/2015  . Mitral valve insufficiency 03/22/2015  . Moderate mitral insufficiency 03/22/2015  . Benign essential hypertension 03/02/2015  . Chronic systolic CHF (congestive heart failure), NYHA class 2 ([place]) 10/25/2014  . Chronic systolic heart failure ([place]) 10/25/2014  . Acute kidney injury (nontraumatic) ([place]) 10/18/2014  . Complicated UTI (urinary tract infection) 10/18/2014  . Pyelonephritis 10/18/2014  . Congestive cardiomyopathy ([place]) 05/11/2014  . Colon polyp 01/01/2013  . Benign prostatic hyperplasia without lower urinary tract symptoms 06/12/2012  . Elevated prostate specific antigen (PSA) 06/12/2012  . Chronic anemia 03/18/2012  . Referral of patient 03/18/2012  . Sepsis due to urinary tract infection ([place]) 03/18/2012  . Sepsis secondary to UTI ([place]) 03/18/2012  .  Elevated PSA 03/17/2012  . Hypertension 03/17/2012  . LBBB (left bundle branch block) 03/17/2012  . Left bundle branch block (LBBB) 03/17/2012  . Microalbuminuria 03/17/2012  . Retinopathy 03/17/2012  .  Atherosclerosis 05/28/2011    Past Surgical History:  Procedure Laterality Date  . CARDIAC SURGERY    . CORONARY ARTERY BYPASS GRAFT    . ENDOBRONCHIAL ULTRASOUND N/A 04/03/2017   Procedure: ENDOBRONCHIAL ULTRASOUND;  Surgeon: Flora Lipps, MD;  Location: [facility] ORS;  Service: Cardiopulmonary;  Laterality: N/A;  . Heart Bypass    . PACEMAKER PLACEMENT     ICD    Prior to Admission medications   Medication Sig Start Date End Date Taking? Authorizing Provider  acetaminophen (TYLENOL) 500 MG tablet Take 1,000 mg by mouth every 8 (eight) hours as needed for mild pain or moderate pain.    [provider]  acyclovir (ZOVIRAX) 400 MG tablet Take 1 tablet (400 mg total) by mouth daily. 08/18/17   Sindy Guadeloupe, MD  aspirin 81 MG tablet Take 81 mg by mouth daily.    [provider]  atorvastatin (LIPITOR) 40 MG tablet Take 40 mg by mouth at bedtime. 02/27/17   [provider]  carvedilol (COREG) 25 MG tablet Take 25 mg by mouth 2 (two) times daily with a meal.    [provider]  fentaNYL (DURAGESIC - DOSED MCG/HR) 12 MCG/HR Place 1 patch (12.5 mcg total) onto the skin every 3 (three) days. 08/28/17   Sindy Guadeloupe, MD  furosemide (LASIX) 40 MG tablet Take 20 mg by mouth daily.    [provider]  gabapentin (NEURONTIN) 100 MG capsule Take 100 mg by mouth daily as needed.     [provider]  hydrALAZINE (APRESOLINE) 25 MG tablet Take 25 mg by mouth 3 (three) times daily.    [provider]  insulin NPH-regular Human (NOVOLIN 70/30) (70-30) 100 UNIT/ML injection Inject 16 Units into the skin.    [provider]  isosorbide dinitrate (ISORDIL) 10 MG tablet Take 10 mg by mouth 3 (three) times daily.    [provider]  lenalidomide (REVLIMID) 15 MG capsule Take 1 capsule (15 mg total) by mouth every other day. For 2 weeks on and 1 week off 08/20/17 09/19/17  Sindy Guadeloupe, MD  metFORMIN (GLUCOPHAGE) 1000 MG tablet Take  1,000-1,500 mg by mouth 2 (two) times daily with a meal. Takes 1500 mg in the morning and 1000 mg in the evening    [provider]  oxyCODONE (OXY IR/ROXICODONE) 5 MG immediate release tablet Take 1 tablet (5 mg total) by mouth every 6 (six) hours as needed for severe pain. 08/15/17   Sindy Guadeloupe, MD  sacubitril-valsartan (ENTRESTO) 49-51 MG Take 1 tablet by mouth 2 (two) times daily.    [provider]  sitaGLIPtin (JANUVIA) 100 MG tablet Take 100 mg by mouth daily.    [provider]  tamsulosin (FLOMAX) 0.4 MG CAPS capsule Take 0.4 mg by mouth daily.    [provider]  vitamin B-12 (CYANOCOBALAMIN) 1000 MCG tablet Take 1,000 mcg by mouth daily.    [provider]  vitamin C (ASCORBIC ACID) 500 MG tablet Take 500 mg by mouth daily.    [provider]    Allergies Patient has no known allergies.  Family History  Problem Relation Age of Onset  . Heart failure Paternal Grandmother     Social History Social History   Tobacco Use  . Smoking status:  Former Smoker    Years: 10.00    Types: Cigars    Last attempt to quit: 10/04/1993    Years since quitting: 23.9  . Smokeless tobacco: Never Used  . Tobacco comment: 3 CIGARS PER DAY  Substance Use Topics  . Alcohol use: No  . Drug use: No    Review of Systems  Constitutional: No fever/chills Eyes: No visual changes. ENT: No sore throat. Cardiovascular: Denies chest pain. Respiratory:  shortness of breath. Gastrointestinal: No abdominal pain.  No nausea, no vomiting.  No diarrhea.  No constipation. Genitourinary: Negative for dysuria. Musculoskeletal: Negative for back pain. Skin: Negative for rash. Neurological: Negative for headaches, focal weakness   ____________________________________________   PHYSICAL EXAM:  VITAL SIGNS: ED Triage Vitals  Enc Vitals Group     BP 09/04/17 1530 (!) 125/38     Pulse Rate 09/04/17 1530 79     Resp 09/04/17 1530 18     Temp  09/04/17 1530 97.9 F (36.6 C)     Temp Source 09/04/17 1530 Oral     SpO2 09/04/17 1530 94 %     Weight 09/04/17 1520 210 lb (95.3 kg)     Height 09/04/17 1520 6' (1.829 m)     Head Circumference --      Peak Flow --      Pain Score 09/04/17 1519 7     Pain Loc --      Pain Edu? --      Excl. in [place]? --     Constitutional: Alert and oriented. Well appearing and in no acute distress. Eyes: Conjunctivae are normal.  Head: Atraumatic. Nose: No congestion/rhinnorhea. Mouth/Throat: Mucous membranes are moist.  Oropharynx non-erythematous. Neck: No stridor.   Cardiovascular: Normal rate, regular rhythm. Grossly normal heart sounds.  Good peripheral circulation. Respiratory: Normal respiratory effort.  No retractions. Lungs CTAB. Gastrointestinal: Soft and nontender. No distention. No abdominal bruits. No CVA tenderness. Musculoskeletal: No lower extremity tenderness he does have edema.  No joint effusions. Neurologic:  Normal speech and language. No gross focal neurologic deficits are appreciated.  Skin:  Skin is warm, dry and intact. No rash noted. Psychiatric: Mood and affect are normal. Speech and behavior are normal.  ____________________________________________   LABS (all labs ordered are listed, but only abnormal results are displayed)  Labs Reviewed  BRAIN NATRIURETIC PEPTIDE - Abnormal; Notable for the following components:      Result Value   B Natriuretic Peptide 245.0 (*)    All other components within normal limits  GLUCOSE, CAPILLARY - Abnormal; Notable for the following components:   Glucose-Capillary 466 (*)    All other components within normal limits  TROPONIN I  TROPONIN I  MULTIPLE MYELOMA PANEL, SERUM  KAPPA/LAMBDA LIGHT CHAINS  CBG MONITORING, ED   ____________________________________________  EKG  EKG read and interpreted by me shows a fully paced rhythm at a rate of 101 ____________________________________________  RADIOLOGY  bladder scan  shows over 900 cc of urine in his bladder patient is unable to urinate ____________________________________________   PROCEDURES  Procedure(s) performed:   Procedures  Critical Care performed:    ____________________________________________   INITIAL IMPRESSION / ASSESSMENT AND PLAN / ED COURSE  discussed patient with oncology. She feels he looks weak and should come in the hospital. he does say he is very short of breath and can't lay flat for the last few days and can't walk more than 10 feet without running out of air. We will get him in the  hospital and get a catheter in him and evaluate him further renal will see him tonight.    I will give him some insulin for his elevated blood sugar before starting him back on his usual dose. He says usually runs in the 120s.   ____________________________________________   FINAL CLINICAL IMPRESSION(S) / ED DIAGNOSES  Final diagnoses:  Bilateral back pain, unspecified back location, unspecified chronicity  Dyspnea, unspecified type  Orthopnea  Urinary retention  Weakness     ED Discharge Orders    None       Note:  This document was prepared using Dragon voice recognition software and may include unintentional dictation errors.    Nena Polio, MD 09/04/17 1722    Nena Polio, MD 09/04/17 [phone]

## 2017-09-04 NOTE — ED Notes (Signed)
Two attempts made by [facility] RN for IV access. Unable to obtain access. MD aware.

## 2017-09-04 NOTE — Progress Notes (Signed)
Patient arrived to clinic for velcade injection only. He feels miserable Having dry heaves with nausea for 24 hours.He has been having hiccups and chills on and off. Back pain is rated a 81/2 out of 10 on pain scale. He is NPO for kyphoplasty scheduled at 1:00P this afternoon. MD notified received orders for hydration, and supportive IV meds. Back pain has reduced to a 6/10. No dry heaving or chills while in clinic. Assisted patient to his car via W/C. Leaving with his wife driving him to [facility] for procedure.

## 2017-09-04 NOTE — H&P (Signed)
[place] [facility] [place] NAME: Adam Wall    MR#:  [id number]  DATE OF BIRTH:  03-07-1946  DATE OF ADMISSION:  09/04/2017  PRIMARY CARE PHYSICIAN: [name], MD   REQUESTING/REFERRING PHYSICIAN: Dr Conni Slipper  CHIEF COMPLAINT:   Chief Complaint  Patient presents with  . Back Pain    HISTORY OF PRESENT ILLNESS:  Adam Wall  is a 72 y.o. male with a known history of multiple myeloma with chronic kidney disease.  He was being evaluated by interventional radiology for kyphoplasty and sent over to the ER for further evaluation.  They were concerned that his platelets were low and then he had some upper airway swelling.  The patient states that he has been gagging on things for a few days.  In the ER, he was found to have urinary retention.  He is very weak and had back pain for a while.  Pain described as 9 out of 10 in intensity.  Patient unable to lift his legs up off the bed.  Hospitalist services were contacted for further evaluation.  PAST MEDICAL HISTORY:   Past Medical History:  Diagnosis Date  . AICD (automatic cardioverter/defibrillator) present   . Anemia   . Blockage of coronary artery of heart ([place])   . CHF (congestive heart failure) ([place])   . Chronic kidney disease 05/22/2017   nephrectomy-right  . Diabetes mellitus without complication ([place])   . Dysrhythmia   . Heart murmur   . Hypertension   . Hypochloremia   . LBBB (left bundle branch block)   . Neuropathy   . Presence of permanent cardiac pacemaker   . Right renal mass   . 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 CARDIAC SURGERY    . CORONARY ARTERY BYPASS GRAFT    . ENDOBRONCHIAL ULTRASOUND N/A 04/03/2017   Procedure: ENDOBRONCHIAL ULTRASOUND;  Surgeon: Flora Lipps, MD;  Location: [facility] ORS;  Service: Cardiopulmonary;  Laterality: N/A;  . Heart Bypass    . PACEMAKER PLACEMENT      ICD    SOCIAL HISTORY:   Social History   Tobacco Use  . Smoking status: Former Smoker    Years: 10.00    Types: Cigars    Last attempt to quit: 10/04/1993    Years since quitting: 23.9  . Smokeless tobacco: Never Used  . Tobacco comment: 3 CIGARS PER DAY  Substance Use Topics  . Alcohol use: No    FAMILY HISTORY:   Family History  Adopted: Yes  Problem Relation Age of Onset  . Heart failure Paternal Grandmother     DRUG ALLERGIES:  No Known Allergies  REVIEW OF SYSTEMS:  CONSTITUTIONAL: No fever.  Positive for chills at night.  Positive for weight gain.  Positive for weakness. EYES: No blurred or double vision.  EARS, NOSE, AND THROAT: No tinnitus or ear pain.  Positive for sore throat.  Patient been spitting up phlegm. RESPIRATORY: Positive for cough, some shortness of breath, no wheezing or hemoptysis.  CARDIOVASCULAR: No chest pain, orthopnea, edema.  GASTROINTESTINAL: Positive for nausea, and vomiting, no diarrhea or abdominal pain. No blood in bowel movements GENITOURINARY: No dysuria, hematuria.  ENDOCRINE: No polyuria, nocturia,  HEMATOLOGY: No anemia, easy bruising or bleeding SKIN: No rash or lesion. MUSCULOSKELETAL: Positive for aching all over  NEUROLOGIC: No tingling, numbness, weakness.  PSYCHIATRY: No anxiety or depression.   MEDICATIONS AT  HOME:   Prior to Admission medications   Medication Sig Start Date End Date Taking? Authorizing Provider  acyclovir (ZOVIRAX) 400 MG tablet Take 1 tablet (400 mg total) by mouth daily. 08/18/17  Yes Sindy Guadeloupe, MD  aspirin 81 MG tablet Take 81 mg by mouth daily.   Yes [provider]  atorvastatin (LIPITOR) 40 MG tablet Take 40 mg by mouth at bedtime. 02/27/17  Yes [provider]  carvedilol (COREG) 25 MG tablet Take 25 mg by mouth 2 (two) times daily with a meal.   Yes [provider]  fentaNYL (DURAGESIC - DOSED MCG/HR) 12 MCG/HR Place 1 patch (12.5 mcg total) onto the skin every 3  (three) days. 08/28/17  Yes Sindy Guadeloupe, MD  furosemide (LASIX) 40 MG tablet Take 20 mg by mouth daily.   Yes [provider]  gabapentin (NEURONTIN) 100 MG capsule Take 100 mg by mouth daily as needed.    Yes [provider]  hydrALAZINE (APRESOLINE) 25 MG tablet Take 25 mg by mouth 3 (three) times daily.   Yes [provider]  insulin NPH-regular Human (NOVOLIN 70/30) (70-30) 100 UNIT/ML injection Inject 16 Units into the skin.   Yes [provider]  lenalidomide (REVLIMID) 15 MG capsule Take 1 capsule (15 mg total) by mouth every other day. For 2 weeks on and 1 week off 08/20/17 09/19/17 Yes Sindy Guadeloupe, MD  metFORMIN (GLUCOPHAGE) 1000 MG tablet Take 1,000-1,500 mg by mouth 2 (two) times daily with a meal. Takes 1500 mg in the morning and 1000 mg in the evening   Yes [provider]  oxyCODONE (OXY IR/ROXICODONE) 5 MG immediate release tablet Take 1 tablet (5 mg total) by mouth every 6 (six) hours as needed for severe pain. 08/15/17  Yes Sindy Gu[place]oupe, MD  tamsulosin (FLOMAX) 0.4 MG CAPS capsule Take 0.4 mg by mouth daily.   Yes [provider]  vitamin B-12 (CYANOCOBALAMIN) 1000 MCG tablet Take 1,000 mcg by mouth daily.   Yes [provider]  vitamin C (ASCORBIC ACID) 500 MG tablet Take 500 mg by mouth daily.   Yes [provider]  acetaminophen (TYLENOL) 500 MG tablet Take 1,000 mg by mouth every 8 (eight) hours as needed for mild pain or moderate pain.    [provider]      VITAL SIGNS:  Blood pressure (!) 125/38, pulse 79, temperature 97.9 F (36.6 C), temperature source Oral, resp. rate 18, height 6' (1.829 m), weight 95.3 kg (210 lb), SpO2 94 %.  PHYSICAL EXAMINATION:  GENERAL:  72 y.o.-year-old patient lying in the bed with no acute distress.  EYES: Pupils equal, round, reactive to light and accommodation. No scleral icterus. Extraocular muscles intact.  HEENT: Head atraumatic, normocephalic.  Oropharynx and nasopharynx clear.  NECK:  Supple, no jugular venous distention. No thyroid enlargement, no tenderness.  LUNGS: Normal breath sounds bilaterally, no wheezing, rales,rhonchi or crepitation. No use of accessory muscles of respiration.  CARDIOVASCULAR: S1, S2 normal. No murmurs, rubs, or gallops.  ABDOMEN: Soft, nontender, nondistended. Bowel sounds present. No organomegaly or mass.  EXTREMITIES: 2+ edema, No cyanosis, or clubbing.  NEUROLOGIC: Cranial nerves II through XII are intact. Muscle strength 3+/5 in all extremities. Sensation intact. Gait not checked.  PSYCHIATRIC: The patient is alert and oriented x 3.  SKIN: No rash, lesion, or ulcer.   LABORATORY PANEL:   CBC Recent Labs  Lab 09/04/17 1325  WBC 8.6  HGB 8.9*  HCT 28.5*  PLT 47*   ------------------------------------------------------------------------------------------------------------------  Chemistries  Recent Labs  Lab 09/04/17 1200  NA 133*  K 4.9  CL 105  CO2 22  GLUCOSE 467*  BUN 110*  CREATININE 3.07*  CALCIUM 7.6*  AST 18  ALT 54  ALKPHOS 81  BILITOT 0.9   ------------------------------------------------------------------------------------------------------------------    RADIOLOGY:  Dg Chest 2 View  Result Date: 09/04/2017 CLINICAL DATA:  Increased shortness of breath especially upon exertion, known compression fractures, history of CHF, hypertension, neuropathy, pacemaker, myeloma EXAM: CHEST  2 VIEW COMPARISON:  Chest x-ray dated 09/02/2014. FINDINGS: Stable cardiomegaly. Median sternotomy wires appear intact and stable in alignment, related to presumed CABG. Aortic atherosclerosis. Pacemaker/ICD apparatus in place. Lungs appear clear. No pleural effusion or pneumothorax seen. Mild degenerative spurring within the thoracic spine. No acute or suspicious osseous finding. IMPRESSION: No active cardiopulmonary disease. No evidence of pneumonia or pulmonary edema. Stable cardiomegaly.  Aortic atherosclerosis. Electronically Signed   By: [name] M.D.   On: 09/04/2017 17:06     IMPRESSION AND PLAN:   1.  Dysphasia and choking on food.  Empiric Protonix.  Case discussed with ENT Dr. Tami Ribas to see him tomorrow.  We will get speech therapy evaluation. 2.  Chronic kidney disease with one kidney.  Urinary retention and Foley catheter placement.  Put on Flomax.  Nephrology to see the patient tomorrow.  Gentle IV fluid hydration. 3.  Multiple myeloma and thrombocytopenia and anemia.  Oncology following.  Holding Revlimid 4.  Type 2 diabetes mellitus.  Put on sliding scale with trouble eating. 5.  Compression fracture.  Physical therapy evaluation 6.  History of congestive heart failure watch closely with IV fluids. 7.  Hyperlipidemia unspecified on atorvastatin 8.  Chronic pain on fentanyl patch    All the records are reviewed and case discussed with ED provider. Management plans discussed with the patient, family and they are in agreement.  CODE STATUS: Full code  TOTAL TIME TAKING CARE OF THIS PATIENT: 50 minutes.    [name] M.D on 09/04/2017 at 6:03 PM  Between 7am to 6pm - Pager - [phone]  After 6pm call admission pager [phone]  [facility] Office  [phone]  CC: Primary care physician; [name], MD

## 2017-09-05 ENCOUNTER — Inpatient Hospital Stay: Payer: Medicare Other

## 2017-09-05 DIAGNOSIS — R0600 Dyspnea, unspecified: Secondary | ICD-10-CM

## 2017-09-05 DIAGNOSIS — R339 Retention of urine, unspecified: Secondary | ICD-10-CM

## 2017-09-05 DIAGNOSIS — R531 Weakness: Secondary | ICD-10-CM

## 2017-09-05 DIAGNOSIS — R0602 Shortness of breath: Secondary | ICD-10-CM

## 2017-09-05 DIAGNOSIS — M5489 Other dorsalgia: Secondary | ICD-10-CM

## 2017-09-05 LAB — CBC
HEMATOCRIT: 26 % — AB (ref 40.0–52.0)
Hemoglobin: 8.2 g/dL — ABNORMAL LOW (ref 13.0–18.0)
MCH: 22.3 pg — ABNORMAL LOW (ref 26.0–34.0)
MCHC: 31.6 g/dL — ABNORMAL LOW (ref 32.0–36.0)
MCV: 70.6 fL — AB (ref 80.0–100.0)
PLATELETS: 41 10*3/uL — AB (ref 150–440)
RBC: 3.69 MIL/uL — ABNORMAL LOW (ref 4.40–5.90)
RDW: 18.7 % — AB (ref 11.5–14.5)
WBC: 7.8 10*3/uL (ref 3.8–10.6)

## 2017-09-05 LAB — BASIC METABOLIC PANEL
Anion gap: 6 (ref 5–15)
BUN: 97 mg/dL — AB (ref 6–20)
CHLORIDE: 111 mmol/L (ref 101–111)
CO2: 22 mmol/L (ref 22–32)
CREATININE: 2.95 mg/dL — AB (ref 0.61–1.24)
Calcium: 7.8 mg/dL — ABNORMAL LOW (ref 8.9–10.3)
GFR calc Af Amer: 23 mL/min — ABNORMAL LOW (ref >60)
GFR calc non Af Amer: 20 mL/min — ABNORMAL LOW (ref >60)
Glucose, Bld: 305 mg/dL — ABNORMAL HIGH (ref 65–99)
POTASSIUM: 4.4 mmol/L (ref 3.5–5.1)
SODIUM: 139 mmol/L (ref 135–145)

## 2017-09-05 LAB — GLUCOSE, CAPILLARY
Glucose-Capillary: 309 mg/dL — ABNORMAL HIGH (ref 65–99)
Glucose-Capillary: 302 mg/dL — ABNORMAL HIGH (ref 65–99)
Glucose-Capillary: 335 mg/dL — ABNORMAL HIGH (ref 65–99)

## 2017-09-05 MED ORDER — ORAL CARE MOUTH RINSE
15.0000 mL | Freq: Two times a day (BID) | OROMUCOSAL | Status: DC
  Administered 2017-09-05 – 2017-09-07 (×3): 15 mL via OROMUCOSAL

## 2017-09-05 MED ORDER — INSULIN DETEMIR 100 UNIT/ML ~~LOC~~ SOLN
6.0000 [IU] | Freq: Every day | SUBCUTANEOUS | Status: DC
  Administered 2017-09-05 – 2017-09-08 (×4): 6 [IU] via SUBCUTANEOUS
  Filled 2017-09-05 (×4): qty 0.06

## 2017-09-05 MED ORDER — ACYCLOVIR 200 MG PO CAPS
400.0000 mg | ORAL_CAPSULE | Freq: Every day | ORAL | Status: DC
  Administered 2017-09-05 – 2017-09-07 (×3): 400 mg via ORAL
  Filled 2017-09-05 (×4): qty 2

## 2017-09-05 MED ORDER — FAMOTIDINE 20 MG PO TABS
10.0000 mg | ORAL_TABLET | Freq: Once | ORAL | Status: AC
  Administered 2017-09-05: 10 mg via ORAL
  Filled 2017-09-05: qty 1

## 2017-09-05 MED ORDER — ADULT MULTIVITAMIN LIQUID CH
15.0000 mL | Freq: Every day | ORAL | Status: DC
  Administered 2017-09-05: 15 mL via ORAL
  Filled 2017-09-05 (×4): qty 15

## 2017-09-05 MED ORDER — PREMIER PROTEIN SHAKE
11.0000 [oz_av] | Freq: Two times a day (BID) | ORAL | Status: DC
  Administered 2017-09-06 – 2017-09-07 (×2): 11 [oz_av] via ORAL

## 2017-09-05 NOTE — Progress Notes (Signed)
PT Cancellation Note  Patient Details Name: Adam Wall MRN: [id number] DOB: 10-May-1946   Cancelled Treatment:    Reason Eval/Treat Not Completed: Patient declined, no reason specified.  PT consult received.  Chart reviewed.  Nursing cleared pt for participation in physical therapy.  Upon PT arrival to pt's room, pt stating  "I can't do therapy today".  Pt also reporting he hasn't been able to walk in 4 days; has a fracture in his back; has struggled to walk to the bathroom; and has 3 doctor's coming to see him today.  Nursing notified of pt's statements.  Pt educated on purpose and benefits of participating in PT but pt continued to decline physical therapy today.  Will re-attempt PT evaluation at a later date/time.  Leitha Bleak, PT 09/05/17, 9:03 AM [phone]

## 2017-09-05 NOTE — Progress Notes (Signed)
[place] [facility] [place] NAME: Adam Wall    MR#:  [id number]  DATE OF BIRTH:  [birth year]  SUBJECTIVE:   Patient doing fine this am Not c/o back pain  REVIEW OF SYSTEMS:    Review of Systems  Constitutional: Negative for fever, chills weight loss HENT: Negative for ear pain, nosebleeds, congestion, facial swelling, rhinorrhea, neck pain, neck stiffness and ear discharge.   Respiratory: Negative for cough, shortness of breath, wheezing  Cardiovascular: Negative for chest pain, palpitations and leg swelling.  Gastrointestinal: Negative for heartburn, abdominal pain, vomiting, diarrhea or consitpation Genitourinary: Negative for dysuria, urgency, frequency, hematuria Musculoskeletal: Negative for back pain or joint pain Neurological: Negative for dizziness, seizures, syncope, focal weakness,  numbness and headaches.  Hematological: Does not bruise/bleed easily.  Psychiatric/Behavioral: Negative for hallucinations, confusion, dysphoric mood    Tolerating Diet: yes      DRUG ALLERGIES:  No Known Allergies  VITALS:  Blood pressure (!) 154/48, pulse 80, temperature 98.3 F (36.8 C), temperature source Oral, resp. rate 17, height 6' (1.829 m), weight 95.3 kg (210 lb), SpO2 98 %.  PHYSICAL EXAMINATION:  Constitutional: Appears well-developed and well-nourished. No distress. HENT: Normocephalic. Marland Kitchen Oropharynx is clear and moist.  Eyes: Conjunctivae and EOM are normal. PERRLA, no scleral icterus.  Neck: Normal ROM. Neck supple. No JVD. No tracheal deviation. CVS: RRR, S1/S2 +, no murmurs, no gallops, no carotid bruit.  Pulmonary: Effort and breath sounds normal, no stridor, rhonchi, wheezes, rales.  Abdominal: Soft. BS +,  no distension, tenderness, rebound or guarding.  Musculoskeletal: Normal range of motion. No edema and no tenderness.  Neuro: Alert. CN 2-12 grossly intact. No focal deficits. Skin: Skin is warm and dry. No rash  noted. Psychiatric: Normal mood and affect.      LABORATORY PANEL:   CBC Recent Labs  Lab 09/05/17 0421  WBC 7.8  HGB 8.2*  HCT 26.0*  PLT 41*   ------------------------------------------------------------------------------------------------------------------  Chemistries  Recent Labs  Lab 09/04/17 1200 09/05/17 0421  NA 133* 139  K 4.9 4.4  CL 105 111  CO2 22 22  GLUCOSE 467* 305*  BUN 110* 97*  CREATININE 3.07* 2.95*  CALCIUM 7.6* 7.8*  AST 18  --   ALT 54  --   ALKPHOS 81  --   BILITOT 0.9  --    ------------------------------------------------------------------------------------------------------------------  Cardiac Enzymes Recent Labs  Lab 09/04/17 1512 09/04/17 2037  TROPONINI <0.03 <0.03   ------------------------------------------------------------------------------------------------------------------  RADIOLOGY:  Dg Chest 2 View  Result Date: 09/04/2017 CLINICAL DATA:  Increased shortness of breath especially upon exertion, known compression fractures, history of CHF, hypertension, neuropathy, pacemaker, myeloma EXAM: CHEST  2 VIEW COMPARISON:  Chest x-ray dated 09/02/2014. FINDINGS: Stable cardiomegaly. Median sternotomy wires appear intact and stable in alignment, related to presumed CABG. Aortic atherosclerosis. Pacemaker/ICD apparatus in place. Lungs appear clear. No pleural effusion or pneumothorax seen. Mild degenerative spurring within the thoracic spine. No acute or suspicious osseous finding. IMPRESSION: No active cardiopulmonary disease. No evidence of pneumonia or pulmonary edema. Stable cardiomegaly. Aortic atherosclerosis. Electronically Signed   By: [name] M.D.   On: 09/04/2017 17:06     ASSESSMENT AND PLAN:   72 y/o male with history of IgG multiple myeloma, chronic kidney disease stage III status post right nephrectomy and diabetes who was sent to the ER by interventional radiology due to low platelets and complaint of  difficulty swallowing.  1. Dysphagia: Plan for ENT and speech consultant. Tolerated  diet this am  2. Chronic kidney disease status post right nephrectomy: Nephrology consultation requested Concerned that he may need to start dialysis.  3. 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 L5 vertebral fracture: Plan was for kyphoplasty however due to acute issues this will be rescheduled. He is also scheduled to start radiation next week.  5. Diabetes: Diabetes nurse consult appreciated  Continue sliding scale with levemir  6. BPH with urinary retention: Continue Flomax   PT eval pending Management plans discussed with the patient and he is in agreement.  CODE STATUS: FULL  TOTAL TIME TAKING CARE OF THIS PATIENT: 30 minutes.     POSSIBLE D/C 2-3 days, DEPENDING ON CLINICAL CONDITION.   Adam Wall M.D on 09/05/2017 at 10:44 AM  Between 7am to 6pm - Pager - [phone] After 6pm go to [email or web address] - password [email or web address] [facility]  Office  [phone]  CC: Primary care physician; Adam Boys, MD  Note: This dictation was prepared with Dragon dictation along with smaller phrase technology. Any transcriptional errors that result from this process are unintentional.

## 2017-09-05 NOTE — Progress Notes (Signed)
Inpatient Diabetes Program Recommendations  AACE/ADA: New Consensus Statement on Inpatient Glycemic Control (2015)  Target Ranges:  Prepandial:   less than 140 mg/dL      Peak postprandial:   less than 180 mg/dL (1-2 hours)      Critically ill patients:  140 - 180 mg/dL   Lab Results  Component Value Date   ZGYFVC 944 (H) 09/04/2017    Review of Glycemic Control Results for Adam Wall, Adam Wall (MRN [id number]) as of 09/05/2017 08:29  Ref. Range 09/04/2017 13:37 09/04/2017 15:36 09/04/2017 21:01 09/04/2017 23:11  Glucose-Capillary Latest Ref Range: 65 - 99 mg/dL 462 (H) 466 (H) 439 (H) 398 (H)    Diabetes history: Type 2 Outpatient Diabetes medications: NPH 70/30 18 units qam, NPH 70/30 12 units qpm qam   Current orders for Inpatient glycemic control: Novolog 0-9 units tid and hs  Inpatient Diabetes Program Recommendations: Consider adding Levemir 6 units bid, continue Novolog 0-9 units  tid and hs.  Per ADA recommendations "consider performing an A1C on all patients with diabetes or hyperglycemia admitted to the hospital if not performed in the prior 3 months".  Gentry Fitz, RN, BA, MHA, CDE Diabetes Coordinator Inpatient Diabetes Program  [phone] (Team Pager) [phone] ([place]) 09/05/2017 8:44 AM

## 2017-09-05 NOTE — Consult Note (Signed)
[facility]  CONSULT NOTE    Date: 09/05/2017                  Patient Name:  Adam Wall  MRN: [id number]  DOB: 1946/03/05  Age / Sex: 72 y.o., male         PCP: [name], MD                 Service Requesting Consult: Dr. Benjie Karvonen                 Reason for Consult: Acute renal failure            History of Present Illness: Mr. Adam Wall is a 72 y.o. black male with multiple myeloma, right nephrectomy secondary to renal cell carcinoma, congestive heart failure, anemia, AICD placement, diabetes mellitus type II, coronary artery disease status post CABG, who was admitted to [facility] on 09/04/2017 for Orthopnea [R06.01] Urinary retention [R33.9] Weakness [R53.1] Dyspnea, unspecified type [R06.00] Bilateral back pain, unspecified back location, unspecified chronicity [M54.9]  Patient found to have a creatinine of 3.09 yesterday and was started on IV fluids. NS at 43m/hr. Urine output good at 22042m   Complains of difficulty swallowing.   Patient was scheduled for L5 kyphoplasty due to vertebral fracture.   [facility]. He was found to have thrombocytopenia.    Medications: Outpatient medications: Medications Prior to Admission  Medication Sig Dispense Refill Last Dose  . acyclovir (ZOVIRAX) 400 MG tablet Take 1 tablet (400 mg total) by mouth daily. 30 tablet 5 09/04/2017 at AM  . aspirin 81 MG tablet Take 81 mg by mouth daily.   09/04/2017 at AM  . atorvastatin (LIPITOR) 40 MG tablet Take 40 mg by mouth at bedtime.   09/03/2017 at PM  . carvedilol (COREG) 25 MG tablet Take 25 mg by mouth 2 (two) times daily with a meal.   09/04/2017 at AM  . fentaNYL (DURAGESIC - DOSED MCG/HR) 12 MCG/HR Place 1 patch (12.5 mcg total) onto the skin every 3 (three) days. 10 patch 0 09/04/2017 at UTD  . furosemide (LASIX) 40 MG tablet Take 20 mg by mouth daily.   09/04/2017 at  AM  . gabapentin (NEURONTIN) 100 MG capsule Take 100 mg by mouth daily as needed.    PRN at PRN  . hydrALAZINE (APRESOLINE) 25 MG tablet Take 25 mg by mouth 3 (three) times daily.   09/04/2017 at AM  . insulin NPH-regular Human (NOVOLIN 70/30) (70-30) 100 UNIT/ML injection Inject 12-18 Units into the skin 2 (two) times daily. 18u (AM) and 12u (PM)   09/03/2017 at PM  . oxyCODONE (OXY IR/ROXICODONE) 5 MG immediate release tablet Take 1 tablet (5 mg total) by mouth every 6 (six) hours as needed for severe pain. 60 tablet 0 PRN at PRN  . tamsulosin (FLOMAX) 0.4 MG CAPS capsule Take 0.4 mg by mouth daily.   09/04/2017 at AM  . vitamin B-12 (CYANOCOBALAMIN) 1000 MCG tablet Take 1,000 mcg by mouth daily.   09/04/2017 at AM  . vitamin C (ASCORBIC ACID) 500 MG tablet Take 500 mg by mouth daily.   09/04/2017 at AM  . acetaminophen (TYLENOL) 500 MG tablet Take 1,000 mg by mouth every 8 (eight) hours as needed for mild pain or moderate pain.   PRN at PRN  . lenalidomide (REVLIMID) 15 MG capsule Take 1 capsule (15 mg total)  by mouth every other day. For 2 weeks on and 1 week off (Patient not taking: Reported on 09/04/2017) 8 capsule 4 Not Taking at --    Current medications: Current Facility-Administered Medications  Medication Dose Route Frequency Provider Last Rate Last Dose  . acetaminophen (TYLENOL) tablet 650 mg  650 mg Oral Q6H PRN [name], MD       Or  . acetaminophen (TYLENOL) suppository 650 mg  650 mg Rectal Q6H PRN Wieting, Richard, MD      . acyclovir (ZOVIRAX) 200 MG capsule 400 mg  400 mg Oral Daily [name], MD   400 mg at 09/05/17 1203  . aspirin EC tablet 81 mg  81 mg Oral Daily [name], MD   81 mg at 09/05/17 1203  . atorvastatin (LIPITOR) tablet 40 mg  40 mg Oral QHS [name], MD   40 mg at 09/04/17 2104  . carvedilol (COREG) tablet 25 mg  25 mg Oral BID WC [name], MD   25 mg at 09/05/17 0811  . [START ON 09/07/2017] fentaNYL (DURAGESIC - dosed mcg/hr)  12.5 mcg  12.5 mcg Transdermal Q72H Wieting, Richard, MD      . heparin injection 5,000 Units  5,000 Units Subcutaneous Q8H [name], MD   5,000 Units at 09/05/17 0515  . hydrALAZINE (APRESOLINE) tablet 25 mg  25 mg Oral TID [name], MD   25 mg at 09/05/17 1204  . insulin aspart (novoLOG) injection 0-9 Units  0-9 Units Subcutaneous TID AC & HS Lance Coon, MD   7 Units at 09/05/17 1210  . insulin detemir (LEVEMIR) injection 6 Units  6 Units Subcutaneous Daily Bettey Costa, MD   6 Units at 09/05/17 1210  . ondansetron (ZOFRAN) injection 4 mg  4 mg Intravenous Q6H PRN [name], MD   4 mg at 09/04/17 2104  . oxyCODONE (Oxy IR/ROXICODONE) immediate release tablet 5 mg  5 mg Oral Q6H PRN Wieting, Richard, MD      . pantoprazole (PROTONIX) injection 40 mg  40 mg Intravenous Q12H [name], MD   40 mg at 09/05/17 1207  . tamsulosin (FLOMAX) capsule 0.4 mg  0.4 mg Oral Daily [name], MD   0.4 mg at 09/05/17 1203  . vitamin B-12 (CYANOCOBALAMIN) tablet 1,000 mcg  1,000 mcg Oral Daily [name], MD   1,000 mcg at 09/05/17 1203  . vitamin C (ASCORBIC ACID) tablet 500 mg  500 mg Oral Daily [name], MD   500 mg at 09/05/17 1203      Allergies: No Known Allergies    Past Medical History: Past Medical History:  Diagnosis Date  . AICD (automatic cardioverter/defibrillator) present   . Anemia   . Blockage of coronary artery of heart ([place])   . CHF (congestive heart failure) ([place])   . Chronic kidney disease 05/22/2017   nephrectomy-right  . Diabetes mellitus without complication ([place])   . Dysrhythmia   . Heart murmur   . Hypertension   . Hypochloremia   . LBBB (left bundle branch block)   . Neuropathy   . Presence of permanent cardiac pacemaker   . Right renal mass   . 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 CARDIAC SURGERY    . CORONARY ARTERY BYPASS GRAFT    .  ENDOBRONCHIAL ULTRASOUND N/A 04/03/2017   Procedure: ENDOBRONCHIAL ULTRASOUND;  Surgeon: Flora Lipps, MD;  Location: [facility] ORS;  Service: Cardiopulmonary;  Laterality: N/A;  . Heart Bypass    . PACEMAKER PLACEMENT     ICD     Family History: Family History  Adopted: Yes  Problem Relation Age of Onset  . Heart failure Paternal Grandmother      Social History: Social History   Socioeconomic History  . Marital status: Married    Spouse name: Not on file  . Number of children: Not on file  . Years of education: Not on file  . Highest education level: Not on file  Social Needs  . Financial resource strain: Not on file  . Food insecurity - worry: Not on file  . Food insecurity - inability: Not on file  . Transportation needs - medical: Not on file  . Transportation needs - non-medical: Not on file  Occupational History  . Not on file  Tobacco Use  . Smoking status: Former Smoker    Years: 10.00    Types: Cigars    Last attempt to quit: 10/04/1993    Years since quitting: 23.9  . Smokeless tobacco: Never Used  . Tobacco comment: 3 CIGARS PER DAY  Substance and Sexual Activity  . Alcohol use: No  . Drug use: No  . Sexual activity: Not on file  Other Topics Concern  . Not on file  Social History Narrative  . Not on file     Review of Systems: Review of Systems  Constitutional: Positive for malaise/fatigue and weight loss. Negative for chills, diaphoresis and fever.  HENT:       Dysphagia  Eyes: Negative.  Negative for blurred vision, double vision, photophobia, pain, discharge and redness.  Respiratory: Positive for shortness of breath. Negative for cough, hemoptysis, sputum production and wheezing.   Cardiovascular: Positive for leg swelling. Negative for chest pain, palpitations, orthopnea, claudication and PND.  Gastrointestinal: Negative.  Negative for abdominal pain, blood in stool, constipation, diarrhea, heartburn, melena, nausea and vomiting.  Genitourinary:  Negative.  Negative for dysuria, flank pain, frequency, hematuria and urgency.  Musculoskeletal: Positive for back pain. Negative for falls, joint pain, myalgias and neck pain.  Skin: Negative.  Negative for itching and rash.  Neurological: Positive for weakness. Negative for dizziness, tingling, tremors, sensory change, speech change, focal weakness, seizures, loss of consciousness and headaches.  Endo/Heme/Allergies: Negative for environmental allergies and polydipsia. Bruises/bleeds easily.  Psychiatric/Behavioral: Negative.  Negative for depression, hallucinations, memory loss, substance abuse and suicidal ideas. The patient is not nervous/anxious and does not have insomnia.     Vital Signs: Blood pressure (!) 142/63, pulse 82, temperature 98.1 F (36.7 C), temperature source Oral, resp. rate 18, height 6' (1.829 m), weight 95.3 kg (210 lb), SpO2 97 %.  Weight trends: Filed Weights   09/04/17 1520  Weight: 95.3 kg (210 lb)    Physical Exam: General: NAD, laying in bed  Head: Normocephalic, atraumatic. Moist oral mucosal membranes  Eyes: Anicteric, PERRL  Neck: Supple, trachea midline  Lungs:  Clear to auscultation  Heart: Regular rate and rhythm +systolic murmur   Abdomen:  Soft, nontender,   Extremities: trace peripheral edema.  Neurologic: Nonfocal, moving all four extremities  Skin: No lesions        Lab results: Basic Metabolic Panel: Recent Labs  Lab 09/04/17 1200 09/05/17 0421  NA 133* 139  K 4.9 4.4  CL 105 111  CO2 22 22  GLUCOSE 467* 305*  BUN 110* 97*  CREATININE 3.07* 2.95*  CALCIUM 7.6* 7.8*    Liver Function Tests: Recent Labs  Lab 09/04/17 1200  AST 18  ALT 54  ALKPHOS 81  BILITOT 0.9  PROT 6.5  ALBUMIN 2.9*   No results for input(s): LIPASE, AMYLASE in the last 168 hours. No results for input(s): AMMONIA in the last 168 hours.  CBC: Recent Labs  Lab 09/04/17 1200 09/04/17 1325 09/05/17 0421  WBC 8.8 8.6 7.8  NEUTROABS 8.2*  --    --   HGB 8.8* 8.9* 8.2*  HCT 27.3* 28.5* 26.0*  MCV 70.8* 71.1* 70.6*  PLT 46* 47* 41*    Cardiac Enzymes: Recent Labs  Lab 09/04/17 1512 09/04/17 2037  TROPONINI <0.03 <0.03    BNP: Invalid input(s): POCBNP  CBG: Recent Labs  Lab 09/04/17 2101 09/04/17 2311 09/05/17 0102 09/05/17 0800 09/05/17 1153  GLUCAP 439* 398* 309* 302* 335*    Microbiology: Results for orders placed or performed during the hospital encounter of 07/19/16  Urine culture     Status: Abnormal   Collection Time: 07/19/16  1:31 PM  Result Value Ref Range Status   Specimen Description URINE, CLEAN CATCH  Final   Special Requests NONE  Final   Culture >=100,000 COLONIES/mL ENTEROCOCCUS FAECALIS (A)  Final   Report Status 07/23/2016 FINAL  Final   Organism ID, Bacteria ENTEROCOCCUS FAECALIS (A)  Final      Susceptibility   Enterococcus faecalis - MIC*    AMPICILLIN <=2 SENSITIVE Sensitive     LEVOFLOXACIN 1 SENSITIVE Sensitive     NITROFURANTOIN <=16 SENSITIVE Sensitive     VANCOMYCIN 1 SENSITIVE Sensitive     * >=100,000 COLONIES/mL ENTEROCOCCUS FAECALIS    Coagulation Studies: Recent Labs    09/04/17 1325  LABPROT 13.6  INR 1.05    Urinalysis: Recent Labs    09/04/17 1845  COLORURINE YELLOW  LABSPEC <1.005*  PHURINE 5.5  GLUCOSEU 500*  HGBUR NEGATIVE  BILIRUBINUR NEGATIVE  KETONESUR NEGATIVE  PROTEINUR 30*  NITRITE NEGATIVE  LEUKOCYTESUR TRACE*      Imaging: Dg Chest 2 View  Result Date: 09/04/2017 CLINICAL DATA:  Increased shortness of breath especially upon exertion, known compression fractures, history of CHF, hypertension, neuropathy, pacemaker, myeloma EXAM: CHEST  2 VIEW COMPARISON:  Chest x-ray dated 09/02/2014. FINDINGS: Stable cardiomegaly. Median sternotomy wires appear intact and stable in alignment, related to presumed CABG. Aortic atherosclerosis. Pacemaker/ICD apparatus in place. Lungs appear clear. No pleural effusion or pneumothorax seen. Mild degenerative  spurring within the thoracic spine. No acute or suspicious osseous finding. IMPRESSION: No active cardiopulmonary disease. No evidence of pneumonia or pulmonary edema. Stable cardiomegaly. Aortic atherosclerosis. Electronically Signed   By: [name] M.D.   On: 09/04/2017 17:06      Assessment & Plan: Mr. Adam Wall is a 72 y.o. black male with multiple myeloma, right nephrectomy secondary to renal cell carcinoma, diastolic congestive heart failure, anemia/thalessemia trait, AICD placement, diabetes mellitus type II, coronary artery disease status post CABG, who was admitted to [facility] on 09/04/2017 for Orthopnea [R06.01] Urinary retention [R33.9] Weakness [R53.1] Dyspnea, unspecified type [R06.00] Bilateral back pain, unspecified back location, unspecified chronicity [M54.9]  1. Acute renal failure on chronic kidney disease stage IV with proteinuria Chronic kidney disease and proteinuria secondary to nephrectomy, multiple myeloma, diabetes and hypertension.  Baseline creatinine of 3.26, GFR of 20 on 07/30/17 Acute renal failure secondary to prerenal azotemia. Creatinine and GFR have improved with nonoliguric urine output with IV fluids.  BUN has no improved as much. There is concern for uremic symptoms. If uremia does no improve, may  need to proceed with dialysis.  - Check viral hepatitis screen  2. Hyponatremia: secondary to hypovolemia - improved to normal range with normal saline  3. Anemia with chronic kidney disease: microcytic. also with multiple myeloma and thrombocytopenia. Concern that this could be due to revlimid and velcade.  Thalessemia trait  4. Diabetes mellitus type II with chronic kidney disease: insulin dependent.   LOS: 1 Adam Wall 1/11/20192:09 PM

## 2017-09-05 NOTE — Evaluation (Addendum)
Clinical/Bedside Swallow Evaluation Patient Details  Name: Adam Wall MRN: [id number] Date of Birth: 08-25-1946  Today's Date: 09/05/2017 Time: SLP Start Time (ACUTE ONLY): 80 SLP Stop Time (ACUTE ONLY): 1330 SLP Time Calculation (min) (ACUTE ONLY): 60 min  Past Medical History:  Past Medical History:  Diagnosis Date  . AICD (automatic cardioverter/defibrillator) present   . Anemia   . Blockage of coronary artery of heart ([place])   . CHF (congestive heart failure) ([place])   . Chronic kidney disease 05/22/2017   nephrectomy-right  . Diabetes mellitus without complication ([place])   . Dysrhythmia   . Heart murmur   . Hypertension   . Hypochloremia   . LBBB (left bundle branch block)   . Neuropathy   . Presence of permanent cardiac pacemaker   . Right renal mass   . Smoldering myeloma [facility])    Past Surgical History:  Past Surgical History:  Procedure Laterality Date  . CARDIAC SURGERY    . CORONARY ARTERY BYPASS GRAFT    . ENDOBRONCHIAL ULTRASOUND N/A 04/03/2017   Procedure: ENDOBRONCHIAL ULTRASOUND;  Surgeon: Flora Lipps, MD;  Location: [facility] ORS;  Service: Cardiopulmonary;  Laterality: N/A;  . Heart Bypass    . PACEMAKER PLACEMENT     ICD   HPI:  Pt is a 72 y.o. black male with multiple myeloma, right nephrectomy secondary to renal cell carcinoma, congestive heart failure, anemia, AICD placement, diabetes mellitus type II, coronary artery disease status post CABG, who was admitted to [facility] on 09/04/2017 for Orthopnea, Bilateral back pain, unspecified back location, unspecified chronicity. Pt stated he ate/drank "normally" until 1 week ago when he began to have dysmotility of the Esophagus w/ regurgitation and hiccups. He feels these issues began after being started on multiple, new medications. Pt denied any s/s of aspiration but significant Esophageal phase discomfort after attempting to start eating; this has impacted his overall oral intake at home which is unusual for him.     Assessment / Plan / Recommendation Clinical Impression  Pt appears to present w/ no significant oropharyngeal phase dysphagia w/ a reduced risk for aspiration following general precautions. No overt oral motor weakness appreciated during exam/po trials. During the oral phase, pt exhibited bolus piecemealing of thin liquid trials - holding the bolus orally and swallowing single smaller sips until cleared orally. Pt stated this "helps get it down w/out it coming back up on me". Instructed pt to use smaller, single sips/bolus size when drinking to lessen need for extensive piecemealing. Pt fed self puree and graham crackers w/ adequate bolus mastication and management and timely A-P transfer for swallowing; oral clearing was [facility]. Pt consumed the trials of thin liquids via cup then straw w/ no overt s/s of aspiration noted. Vocal quality remained clear b/t trials. As the eval ended after ~2-3 ozs of each consistency, pt exhibited a belch/hiccup followed by the involuntary hiccup/spasm presentation - these would occur then subside then reoccur again. NSG/MD updated on eval results and pt's presentation at end of session post po's. Recommend a mech soft diet w/ thin liquids; aspiration precautions; Reflux precautions. Recommend Pills in Puree as needed for easier, safer swallowing. Recommend f/u w/ GI for consultation d/t pt's c/o Esophageal dysmotility and hiccups w/ po intake. SLP Visit Diagnosis: Dysphagia, unspecified (R13.10)    Aspiration Risk  (reduced)    Diet Recommendation  Dysphagia level 3 w/ thin liquids; general aspiration precautions; Reflux precautions.   Medication Administration: Whole meds with puree(if needed for easier swallowing)  Other  Recommendations Recommended Consults: Consider GI evaluation;Consider esophageal assessment Oral Care Recommendations: Oral care BID;Patient independent with oral care;Staff/trained caregiver to provide oral care Other Recommendations: (n/a)    Follow up Recommendations None      Frequency and Duration (n/a)  (n/a)       Prognosis Prognosis for Safe Diet Advancement: Good Barriers to Reach Goals: (baseline medical status; chronic issues and meds)      Swallow Study   General Date of Onset: 09/04/17 HPI: Pt is a 72 y.o. black male with multiple myeloma, right nephrectomy secondary to renal cell carcinoma, congestive heart failure, anemia, AICD placement, diabetes mellitus type II, coronary artery disease status post CABG, who was admitted to [facility] on 09/04/2017 for Orthopnea, Bilateral back pain, unspecified back location, unspecified chronicity. Pt stated he ate/drank "normally" until 1 week ago when he began to have dysmotility of the Esophagus w/ regurgitation and hiccups. He feels these issues began after being started on multiple, new medications. Pt denied any s/s of aspiration but significant Esophageal phase discomfort after attempting to start eating; this has impacted his overall oral intake at home which is unusual for him.  Type of Study: Bedside Swallow Evaluation Previous Swallow Assessment: none reported Diet Prior to this Study: Regular;Thin liquids(until ~3-4 days ago "when this all happened") Temperature Spikes Noted: No(wbc 7.8) Respiratory Status: Room air History of Recent Intubation: No Behavior/Cognition: Alert;Cooperative;Pleasant mood;Distractible(min; conversed w/ SLP w/ eyes closed ) Oral Cavity Assessment: Within Functional Limits Oral Care Completed by SLP: Recent completion by staff Oral Cavity - Dentition: Missing dentition(most natural but wears a partial (not w/ him)) Vision: Functional for self-feeding Self-Feeding Abilities: Able to feed self;Needs set up Patient Positioning: Upright in bed Baseline Vocal Quality: Normal Volitional Cough: Strong Volitional Swallow: Able to elicit    Oral/Motor/Sensory Function Overall Oral Motor/Sensory Function: Within functional limits   Ice Chips Ice  chips: Within functional limits Presentation: Spoon(2 trials)   Thin Liquid Thin Liquid: Impaired Presentation: Cup;Self Fed;Straw(10 trials) Oral Phase Impairments: (piecemealed larger liquid boluses(intentionally)) Oral Phase Functional Implications: Prolonged oral transit(and clearing) Pharyngeal  Phase Impairments: (none)    Nectar Thick Nectar Thick Liquid: Not tested   Honey Thick Honey Thick Liquid: Not tested   Puree Puree: Within functional limits Presentation: Self Fed;Spoon(3 trials)   Solid   GO   Solid: Within functional limits Presentation: Self Fed(3 trials) Other Comments: softer food trials         Orinda Kenner, MS, CCC-SLP Watson,Katherine 09/05/2017,3:50 PM

## 2017-09-05 NOTE — Progress Notes (Signed)
Rechecked patient's blood sugar 1 hour after 15 units were given and it was 309.  Spoke to Dr. Jannifer Franklin and we will continue to monitor blood sugars and reassess at 0300.  Phoebe Sharps N  09/05/2017  1:24 AM

## 2017-09-05 NOTE — Clinical Social Work Note (Signed)
CSW consulted for possible home needs. RN CM will need to be consulted for any home needs. Please re-consult CSW if needed. Shela Leff MSW,LCSW [phone]

## 2017-09-05 NOTE — Consult Note (Signed)
Morgen, Ritacco [id number] 06-03-1946 Bettey Costa, MD  Reason for Consult: Questionable airway compromise  HPI: Joint thoroughly reviewed. Last evening patient was taken radiology for possible kyphoplasty was noted when he laid flat that he was complaining of shortness of breath question that time was airway compromise was taken to the emergency room and admitted. His voice has been normal he has had no obstruction he did have some episodes of emesis in the last week. Is complaining only of a mild sore throat.  Allergies: No Known Allergies  ROS: Review of systems normal other than 12 systems except per HPI.  PMH:  Past Medical History:  Diagnosis Date  . AICD (automatic cardioverter/defibrillator) present   . Anemia   . Blockage of coronary artery of heart ([place])   . CHF (congestive heart failure) ([place])   . Chronic kidney disease 05/22/2017   nephrectomy-right  . Diabetes mellitus without complication ([place])   . Dysrhythmia   . Heart murmur   . Hypertension   . Hypochloremia   . LBBB (left bundle branch block)   . Neuropathy   . Presence of permanent cardiac pacemaker   . Right renal mass   . Smoldering myeloma ([facility])     FH:  Family History  Adopted: Yes  Problem Relation Age of Onset  . Heart failure Paternal Grandmother     SH:  Social History   Socioeconomic History  . Marital status: Married    Spouse name: Not on file  . Number of children: Not on file  . Years of education: Not on file  . Highest education level: Not on file  Social Needs  . Financial resource strain: Not on file  . Food insecurity - worry: Not on file  . Food insecurity - inability: Not on file  . Transportation needs - medical: Not on file  . Transportation needs - non-medical: Not on file  Occupational History  . Not on file  Tobacco Use  . Smoking status: Former Smoker    Years: 10.00    Types: Cigars    Last attempt to quit: 10/04/1993    Years since quitting: 23.9  . Smokeless  tobacco: Never Used  . Tobacco comment: 3 CIGARS PER DAY  Substance and Sexual Activity  . Alcohol use: No  . Drug use: No  . Sexual activity: Not on file  Other Topics Concern  . Not on file  Social History Narrative  . Not on file    PSH:  Past Surgical History:  Procedure Laterality Date  . CARDIAC SURGERY    . CORONARY ARTERY BYPASS GRAFT    . ENDOBRONCHIAL ULTRASOUND N/A 04/03/2017   Procedure: ENDOBRONCHIAL ULTRASOUND;  Surgeon: Flora Lipps, MD;  Location: [facility] ORS;  Service: Cardiopulmonary;  Laterality: N/A;  . Heart Bypass    . PACEMAKER PLACEMENT     ICD    Physical  Exam: CN 2-12 grossly intact and symmetric. EAC/TMs normal BL. Oral cavity, lips, gums, ororpharynx normal with no masses or lesions. The uvula is normal is not enlarged or edematous. Skin warm and dry. Nasal cavity without polyps or purulence. External nose and ears without masses or lesions. EOMI, PERRLA. Neck supple with no masses or lesions. No lymphadenopathy palpated. Thyroid normal with no masses.  Procedure: Flexible fiberoptic laryngoscopy. After patient's consent topical anesthetic of phenylephrine lidocaine solution was placed within each nostril. Approximate 5 minutes a flexible fiberoptic laryngoscope was introduced into the left nostril examination the nasopharynx tongue base and larynx were all  normal there is no evidence of obstruction or edema. He does have dry mucous membranes.   A/P: Normal airway-no evidence of airway obstruction.   Roena Malady 09/05/2017 1:36 PM

## 2017-09-05 NOTE — Progress Notes (Signed)
Hematology/Oncology Consult note [facility]  [phone] [phone]  Patient Care Team: [name], MD as PCP - General (Pediatrics)   Name of the patient: Adam Wall  [id number]  Apr 22, 1946   Date of visit: 09/05/2017  Interval history- reports feeling better today. Back pain is well controlled at rest. He did ambulate to the bathroom  ECOG PS- 2 Pain scale- 4 Opioid associated constipation- no  Review of systems- Review of Systems  Constitutional: Positive for malaise/fatigue. Negative for chills, fever and weight loss.  HENT: Negative for congestion, ear discharge and nosebleeds.   Eyes: Negative for blurred vision.  Respiratory: Negative for cough, hemoptysis, sputum production, shortness of breath and wheezing.   Cardiovascular: Positive for leg swelling. Negative for chest pain, palpitations, orthopnea and claudication.  Gastrointestinal: Negative for abdominal pain, blood in stool, constipation, diarrhea, heartburn, melena, nausea and vomiting.  Genitourinary: Negative for dysuria, flank pain, frequency, hematuria and urgency.  Musculoskeletal: Positive for back pain. Negative for joint pain and myalgias.  Skin: Negative for rash.  Neurological: Negative for dizziness, tingling, focal weakness, seizures, weakness and headaches.  Endo/Heme/Allergies: Does not bruise/bleed easily.  Psychiatric/Behavioral: Negative for depression and suicidal ideas. The patient does not have insomnia.       No Known Allergies   Past Medical History:  Diagnosis Date  . AICD (automatic cardioverter/defibrillator) present   . Anemia   . Blockage of coronary artery of heart ([place])   . CHF (congestive heart failure) ([place])   . Chronic kidney disease 05/22/2017   nephrectomy-right  . Diabetes mellitus without complication ([place])   . Dysrhythmia   . Heart murmur   . Hypertension   . Hypochloremia   . LBBB (left bundle branch block)   .  Neuropathy   . Presence of permanent cardiac pacemaker   . Right renal mass   . 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 CARDIAC SURGERY    . CORONARY ARTERY BYPASS GRAFT    . ENDOBRONCHIAL ULTRASOUND N/A 04/03/2017   Procedure: ENDOBRONCHIAL ULTRASOUND;  Surgeon: Flora Lipps, MD;  Location: [facility] ORS;  Service: Cardiopulmonary;  Laterality: N/A;  . Heart Bypass    . PACEMAKER PLACEMENT     ICD    Social History   Socioeconomic History  . Marital status: Married    Spouse name: Not on file  . Number of children: Not on file  . Years of education: Not on file  . Highest education level: Not on file  Social Needs  . Financial resource strain: Not on file  . Food insecurity - worry: Not on file  . Food insecurity - inability: Not on file  . Transportation needs - medical: Not on file  . Transportation needs - non-medical: Not on file  Occupational History  . Not on file  Tobacco Use  . Smoking status: Former Smoker    Years: 10.00    Types: Cigars    Last attempt to quit: 10/04/1993    Years since quitting: 23.9  . Smokeless tobacco: Never Used  . Tobacco comment: 3 CIGARS PER DAY  Substance and Sexual Activity  . Alcohol use: No  . Drug use: No  . Sexual activity: Not on file  Other Topics Concern  . Not on file  Social History Narrative  . Not on file    Family History  Adopted: Yes  Problem Relation Age of Onset  . Heart  failure Paternal Grandmother      Current Facility-Administered Medications:  .  acetaminophen (TYLENOL) tablet 650 mg, 650 mg, Oral, Q6H PRN **OR** acetaminophen (TYLENOL) suppository 650 mg, 650 mg, Rectal, Q6H PRN, Wieting, Richard, MD .  acyclovir (ZOVIRAX) 200 MG capsule 400 mg, 400 mg, Oral, Daily, Leslye Peer, Richard, MD, 400 mg at 09/05/17 1203 .  aspirin EC tablet 81 mg, 81 mg, Oral, Daily, [name], MD, 81 mg at 09/05/17 1203 .  atorvastatin (LIPITOR) tablet 40 mg, 40 mg, Oral, QHS,  Wieting, Richard, MD, 40 mg at 09/04/17 2104 .  carvedilol (COREG) tablet 25 mg, 25 mg, Oral, BID WC, Wieting, Richard, MD, 25 mg at 09/05/17 1748 .  [START ON 09/07/2017] fentaNYL (DURAGESIC - dosed mcg/hr) 12.5 mcg, 12.5 mcg, Transdermal, Q72H, Wieting, Richard, MD .  heparin injection 5,000 Units, 5,000 Units, Subcutaneous, Q8H, Wieting, Richard, MD, 5,000 Units at 09/05/17 1414 .  hydrALAZINE (APRESOLINE) tablet 25 mg, 25 mg, Oral, TID, Leslye Peer, Richard, MD, 25 mg at 09/05/17 1748 .  insulin aspart (novoLOG) injection 0-9 Units, 0-9 Units, Subcutaneous, TID AC & HS, Lance Coon, MD, 7 Units at 09/05/17 1749 .  insulin detemir (LEVEMIR) injection 6 Units, 6 Units, Subcutaneous, Daily, Mody, Sital, MD, 6 Units at 09/05/17 1210 .  MEDLINE mouth rinse, 15 mL, Mouth Rinse, BID, Mody, Sital, MD .  multivitamin liquid 15 mL, 15 mL, Oral, Daily, Mody, Sital, MD, 15 mL at 09/05/17 1746 .  ondansetron (ZOFRAN) injection 4 mg, 4 mg, Intravenous, Q6H PRN, [name], MD, 4 mg at 09/04/17 2104 .  oxyCODONE (Oxy IR/ROXICODONE) immediate release tablet 5 mg, 5 mg, Oral, Q6H PRN, Wieting, Richard, MD .  pantoprazole (PROTONIX) injection 40 mg, 40 mg, Intravenous, Q12H, [name], MD, 40 mg at 09/05/17 1207 .  [START ON 09/06/2017] protein supplement (PREMIER PROTEIN) liquid, 11 oz, Oral, BID BM, Mody, Sital, MD .  tamsulosin (FLOMAX) capsule 0.4 mg, 0.4 mg, Oral, Daily, Wieting, Richard, MD, 0.4 mg at 09/05/17 1203 .  vitamin B-12 (CYANOCOBALAMIN) tablet 1,000 mcg, 1,000 mcg, Oral, Daily, Leslye Peer, Richard, MD, 1,000 mcg at 09/05/17 1203 .  vitamin C (ASCORBIC ACID) tablet 500 mg, 500 mg, Oral, Daily, Leslye Peer, Richard, MD, 500 mg at 09/05/17 1203  Physical exam:  Vitals:   09/04/17 2016 09/05/17 0415 09/05/17 1203 09/05/17 1217  BP: (!) 147/44 (!) 154/48 (!) 146/60 (!) 142/63  Pulse: 72 80  82  Resp: _0 Temp: 98.5 F (36.9 C) 98.3 F (36.8 C)  98.1 F (36.7 C)  TempSrc: Oral Oral   Oral  SpO2: 98% 98%  97%  Weight:      Height:       Physical Exam  Constitutional: He is oriented to person, place, and time.  Appears less fatigued  HENT:  Head: Normocephalic and atraumatic.  Eyes: EOM are normal. Pupils are equal, round, and reactive to light.  Neck: Normal range of motion.  Cardiovascular: Normal rate, regular rhythm and normal heart sounds.  Pulmonary/Chest: Effort normal and breath sounds normal.  Abdominal: Soft. Bowel sounds are normal.  Foley in place draining clear urine  Musculoskeletal: He exhibits edema.  Neurological: He is alert and oriented to person, place, and time.  Skin: Skin is warm and dry.     CMP Latest Ref Rng & Units 09/05/2017  Glucose 65 - 99 mg/dL 305(H)  BUN 6 - 20 mg/dL 97(H)  Creatinine 0.61 - 1.24 mg/dL 2.95(H)  Sodium 135 - 145 mmol/L 139  Potassium 3.5 - 5.1 mmol/L 4.4  Chloride 101 - 111 mmol/L 111  CO2 22 - 32 mmol/L 22  Calcium 8.9 - 10.3 mg/dL 7.8(L)  Total Protein 6.5 - 8.1 g/dL -  Total Bilirubin 0.3 - 1.2 mg/dL -  Alkaline Phos 38 - 126 U/L -  AST 15 - 41 U/L -  ALT 17 - 63 U/L -   CBC Latest Ref Rng & Units 09/05/2017  WBC 3.8 - 10.6 K/uL 7.8  Hemoglobin 13.0 - 18.0 g/dL 8.2(L)  Hematocrit 40.0 - 52.0 % 26.0(L)  Platelets 150 - 440 K/uL 41(L)    _0 @  Dg Chest 2 View  Result Date: 09/04/2017 CLINICAL DATA:  Increased shortness of breath especially upon exertion, known compression fractures, history of CHF, hypertension, neuropathy, pacemaker, myeloma EXAM: CHEST  2 VIEW COMPARISON:  Chest x-ray dated 09/02/2014. FINDINGS: Stable cardiomegaly. Median sternotomy wires appear intact and stable in alignment, related to presumed CABG. Aortic atherosclerosis. Pacemaker/ICD apparatus in place. Lungs appear clear. No pleural effusion or pneumothorax seen. Mild degenerative spurring within the thoracic spine. No acute or suspicious osseous finding. IMPRESSION: No active cardiopulmonary disease. No evidence of  pneumonia or pulmonary edema. Stable cardiomegaly. Aortic atherosclerosis. Electronically Signed   By: [name] M.D.   On: 09/04/2017 17:06   Ct Lumbar Spine Wo Contrast  Result Date: 08/28/2017 CLINICAL DATA:  Followup abnormal PET scan at L5. History of multiple myeloma. EXAM: CT LUMBAR SPINE WITHOUT CONTRAST TECHNIQUE: Multidetector CT imaging of the lumbar spine was performed without intravenous contrast administration. Multiplanar CT image reconstructions were also generated. COMPARISON:  PET scan 08/25/2017 and 10/28/2016. FINDINGS: Segmentation: 5 lumbar type vertebral bodies. Alignment: Normal Vertebrae: Old superior endplate Schmorl's nodes/fractures at L1 and L3. Acute or subacute fracture at the superior endplate of L5 with loss of height centrally of about 25%. No retropulsed bone. The bone underlying the fracture appears somewhat lucent, suggesting that there could be an underlying marrow space lesion. No extraosseous extension of tumor, if present. Lytic areas noted in both iliac bones consistent with the history of myeloma. Paraspinal and other soft tissues: No significant paraspinous soft tissue finding. Disc levels: Solid bridging osteophytes from the thoracic region through L4. Mild chronic disc bulges and facet arthritis without likely significant stenosis. IMPRESSION: Acute or subacute superior endplate fracture at L5 with loss of height centrally of 25%. Slight lucency at the bone underlying the fracture raises the possibility of an underlying marrow space lesion. Lucent areas in both iliac bones suggesting the presence of myeloma in those regions. Electronically Signed   By: [name] M.D.   On: 08/28/2017 11:19   Nm Pet Image Restage (ps) Whole Body  Result Date: 08/25/2017 CLINICAL DATA:  The the treatment strategy for tumor type. EXAM: NUCLEAR MEDICINE PET WHOLE BODY TECHNIQUE: mCi F-18 FDG was injected intravenously. Full-ring PET imaging was performed from the vertex to the  feet after the radiotracer. CT data was obtained and used for attenuation correction and anatomic localization. FASTING BLOOD GLUCOSE:  Value:  mg/dl COMPARISON:  None. FINDINGS: HEAD/NECK No hypermetabolic activity in the scalp. No hypermetabolic cervical lymph nodes. CHEST Persistent hypermetabolic foci identified in the mediastinum and hilar regions. This is similar but slightly decreased compared to prior study. Index prevascular lymph node demonstrates SUV max = 5.4. The heart is enlarged. Left-sided pacer/AICD again noted. Mild FDG uptake at the area around the battery pack is probably related to granulation tissue. Distal esophageal uptake noted previously is less apparent today.  ABDOMEN/PELVIS No abnormal hypermetabolic activity within the liver, pancreas, adrenal glands, or spleen. No hypermetabolic lymph nodes in the abdomen or pelvis. There is abdominal aortic atherosclerosis without aneurysm. Stable 16 mm low-density splenic lesion without hypermetabolic FDG accumulation. SKELETON New uptake is seen diffusely in the L5 vertebral body which has become much more heterogeneous in the interval with areas of lucency. There may be a fracture line in the vertebral body visible on image 227. Activity seen previously and T8 and T10 is not readily evident today. The bony mineralization remains diffusely heterogeneous EXTREMITIES Uptake is identified in the soft tissues posterior to the right calcaneus. This is associated with soft tissue edema in the lower legs bilaterally. IMPRESSION: 1. Interval development of hypermetabolism in the L5 vertebral body. Axial CT imaging raises the question of fracture, possibly pathologic. 2. Activity seen previously at T8 and T10 is not evident on today's exam. 3. Similar but less conspicuous hypermetabolic lymph nodes in the mediastinum and hilar regions. 4. Focal hypermetabolism in the soft tissues posterior to the right calcaneus with diffuse soft tissue edema seen in the  lower extremities bilaterally. Electronically Signed   By: Misty Stanley M.D.   On: 08/25/2017 13:50   Ct Bone Marrow Biopsy & Aspiration  Result Date: 08/20/2017 CLINICAL DATA:  Multiple myeloma. Need for bone marrow biopsy to establish status of plasma cells in the bone marrow. EXAM: CT GUIDED BONE MARROW ASPIRATION AND BIOPSY ANESTHESIA/SEDATION: Versed 3.0 mg IV, Fentanyl 75 mcg IV Total Moderate Sedation Time:  17 minutes. The patient's level of consciousness and physiologic status were continuously monitored during the procedure by Radiology nursing. PROCEDURE: The procedure risks, benefits, and alternatives were explained to the patient. Questions regarding the procedure were encouraged and answered. The patient understands and consents to the procedure. A time out was performed prior to initiating the procedure. The right gluteal region was prepped with chlorhexidine. Sterile gown and sterile gloves were used for the procedure. Local anesthesia was provided with 1% Lidocaine. Under CT guidance, an 11 gauge On Control bone cutting needle was advanced from a posterior approach into the right iliac bone. Needle positioning was confirmed with CT. Initial non heparinized and heparinized aspirate samples were obtained of bone marrow. Core biopsy was performed via the On Control drill needle. COMPLICATIONS: None FINDINGS: Inspection of initial aspirate did reveal visible particles. Intact core biopsy sample was obtained. IMPRESSION: CT guided bone marrow biopsy of right posterior iliac bone with both aspirate and core samples obtained. Electronically Signed   By: Aletta Edouard M.D.   On: 08/20/2017 09:58     Assessment and plan- Patient is a 72 y.o. male with IgG lambda multiple myeloma admitted for uremic symptoms and need for possible dialysis  1. Appreciate DR. Kolluru's input. Depending on clinical statusover next few days, he will decide whether patient needs dialysi or not.  2. Multiple  myeloma- treatment currently on hold due to thrombocytopenia. I will likely restart treatment in 10 days to 2 weeks depending on count recovery  3. L5 pathologic fracture- I have spoken to Dr. Anselm Pancoast from IR. I would ideally like the patien to get kyphoplasty as an inpatient given that he is frail and will need inpatient monitoring for a day post procedure. Please keep him NPO after midnight on Sunday. I will touch base with IR on Monday morning and see if they can do kyphoplasty on that day to obtain poain relief. He also starts palliative RT next week  4. 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 Bilateral back pain, unspecified back location, unspecified chronicity   2. Dyspnea, unspecified type   3. Orthopnea   4. Urinary retention   5. Weakness   6. SOB (shortness of breath)      Dr. Randa Evens, MD, MPH [facility] at [facility] Pager- [phone] 09/05/2017 7:05 PM

## 2017-09-06 ENCOUNTER — Inpatient Hospital Stay: Payer: Medicare Other

## 2017-09-06 DIAGNOSIS — C9000 Multiple myeloma not having achieved remission: Secondary | ICD-10-CM | POA: Diagnosis not present

## 2017-09-06 LAB — BASIC METABOLIC PANEL
Anion gap: 4 — ABNORMAL LOW (ref 5–15)
BUN: 72 mg/dL — AB (ref 6–20)
CALCIUM: 7.6 mg/dL — AB (ref 8.9–10.3)
CO2: 23 mmol/L (ref 22–32)
Chloride: 113 mmol/L — ABNORMAL HIGH (ref 101–111)
Creatinine, Ser: 2.63 mg/dL — ABNORMAL HIGH (ref 0.61–1.24)
GFR calc Af Amer: 26 mL/min — ABNORMAL LOW (ref >60)
GFR, EST NON AFRICAN AMERICAN: 23 mL/min — AB (ref >60)
GLUCOSE: 261 mg/dL — AB (ref 65–99)
Potassium: 4.3 mmol/L (ref 3.5–5.1)
Sodium: 140 mmol/L (ref 135–145)

## 2017-09-06 LAB — CBC
HEMATOCRIT: 27.9 % — AB (ref 40.0–52.0)
Hemoglobin: 8.9 g/dL — ABNORMAL LOW (ref 13.0–18.0)
MCH: 22.5 pg — AB (ref 26.0–34.0)
MCHC: 32 g/dL (ref 32.0–36.0)
MCV: 70.5 fL — AB (ref 80.0–100.0)
PLATELETS: 36 10*3/uL — AB (ref 150–440)
RBC: 3.95 MIL/uL — ABNORMAL LOW (ref 4.40–5.90)
RDW: 18.8 % — AB (ref 11.5–14.5)
WBC: 8.4 10*3/uL (ref 3.8–10.6)

## 2017-09-06 LAB — URINE CULTURE

## 2017-09-06 LAB — HEPATITIS B SURFACE ANTIBODY,QUALITATIVE: HEP B S AB: NONREACTIVE

## 2017-09-06 LAB — HEPATITIS B CORE ANTIBODY, IGM: Hep B C IgM: NEGATIVE

## 2017-09-06 LAB — HEPATITIS B SURFACE ANTIGEN: Hepatitis B Surface Ag: NEGATIVE

## 2017-09-06 NOTE — Progress Notes (Signed)
[place] at [place] NAME: Adam Wall    MR#:  [id number]  DATE OF BIRTH:  Mar 19, 1946  SUBJECTIVE:   Pt feeling better, states pain in back better, he reports his swallowing is better  REVIEW OF SYSTEMS:    Review of Systems  Constitutional: Negative for fever, chills weight loss HENT: Negative for ear pain, nosebleeds, congestion, facial swelling, rhinorrhea, neck pain, neck stiffness and ear discharge.   Respiratory: Negative for cough, shortness of breath, wheezing  Cardiovascular: Negative for chest pain, palpitations and leg swelling.  Gastrointestinal: Negative for heartburn, abdominal pain, vomiting, diarrhea or consitpation Genitourinary: Negative for dysuria, urgency, frequency, hematuria Musculoskeletal: Negative for back pain or joint pain Neurological: Negative for dizziness, seizures, syncope, focal weakness,  numbness and headaches.  Hematological: Does not bruise/bleed easily.  Psychiatric/Behavioral: Negative for hallucinations, confusion, dysphoric mood    Tolerating Diet: yes      DRUG ALLERGIES:  No Known Allergies  VITALS:  Blood pressure (!) 157/50, pulse 80, temperature (!) 97.5 F (36.4 C), temperature source Oral, resp. rate 20, height 6' (1.829 m), weight 210 lb (95.3 kg), SpO2 99 %.  PHYSICAL EXAMINATION:  Constitutional: Appears well-developed and well-nourished. No distress. HENT: Normocephalic. [place] Kitchen Oropharynx is clear and moist.  Eyes: Conjunctivae and EOM are normal. PERRLA, no scleral icterus.  Neck: Normal ROM. Neck supple. No JVD. No tracheal deviation. CVS: RRR, S1/S2 +, no murmurs, no gallops, no carotid bruit.  Pulmonary: Effort and breath sounds normal, no stridor, rhonchi, wheezes, rales.  Abdominal: Soft. BS +,  no distension, tenderness, rebound or guarding.  Musculoskeletal: Normal range of motion. No edema and no tenderness.  Neuro: Alert. CN 2-12 grossly intact. No focal  deficits. Skin: Skin is warm and dry. No rash noted. Psychiatric: Normal mood and affect.      LABORATORY PANEL:   CBC Recent Labs  Lab 09/06/17 0610  WBC 8.4  HGB 8.9*  HCT 27.9*  PLT 36*   ------------------------------------------------------------------------------------------------------------------  Chemistries  Recent Labs  Lab 09/04/17 1200  09/06/17 0610  NA 133*   < > 140  K 4.9   < > 4.3  CL 105   < > 113*  CO2 22   < > 23  GLUCOSE 467*   < > 261*  BUN 110*   < > 72*  CREATININE 3.07*   < > 2.63*  CALCIUM 7.6*   < > 7.6*  AST 18  --   --   ALT 54  --   --   ALKPHOS 81  --   --   BILITOT 0.9  --   --    < > = values in this interval not displayed.   ------------------------------------------------------------------------------------------------------------------  Cardiac Enzymes Recent Labs  Lab 09/04/17 1512 09/04/17 2037  TROPONINI <0.03 <0.03   ------------------------------------------------------------------------------------------------------------------  RADIOLOGY:  Dg Chest 2 View  Result Date: 09/04/2017 CLINICAL DATA:  Increased shortness of breath especially upon exertion, known compression fractures, history of CHF, hypertension, neuropathy, pacemaker, myeloma EXAM: CHEST  2 VIEW COMPARISON:  Chest x-ray dated 09/02/2014. FINDINGS: Stable cardiomegaly. Median sternotomy wires appear intact and stable in alignment, related to presumed CABG. Aortic atherosclerosis. Pacemaker/ICD apparatus in place. Lungs appear clear. No pleural effusion or pneumothorax seen. Mild degenerative spurring within the thoracic spine. No acute or suspicious osseous finding. IMPRESSION: No active cardiopulmonary disease. No evidence of pneumonia or pulmonary edema. Stable cardiomegaly. Aortic atherosclerosis. Electronically Signed   By: Roxy Horseman.D.  On: 09/04/2017 17:06   US Renal  Result Date: 09/06/2017 CLINICAL DATA:  Acute kidney failure EXAM: RENAL /  URINARY TRACT ULTRASOUND COMPLETE COMPARISON:  CT 10/27/2012 FINDINGS: Right Kidney: Length: Surgically absent. Left Kidney: Length: 11.9 cm. Echogenicity within normal limits. No mass or hydronephrosis visualized. Bladder: Foley catheter in place, decompressed. IMPRESSION: Prior right nephrectomy. Left kidney unremarkable. Electronically Signed   By: [name] M.D.   On: 09/06/2017 15:48     ASSESSMENT AND PLAN:   72 y/o male with history of IgG multiple myeloma, chronic kidney disease stage III status post right nephrectomy and diabetes who was sent to the ER by interventional radiology due to low platelets and complaint of difficulty swallowing.  1. Dysphagia:  Doing better patient was able to eat Seen by ENT no obstruction noted   2. Chronic kidney disease status post right nephrectomy: Continue IV fluids Renal function improved  3. 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 L5 vertebral fracture: Plan was for kyphoplasty  Per oncology plan is for interventional radiology to do this procedure on Monday   5. Diabetes: Diabetes nurse consult appreciated Continue sliding scale with levemir  6. BPH with urinary retention: Continue Flomax     CODE STATUS: FULL  TOTAL TIME TAKING CARE OF THIS PATIENT: 30 minutes.     POSSIBLE D/C 2-3 days, DEPENDING ON CLINICAL CONDITION.   Dustin Flock M.D on 09/06/2017 at 4:14 PM  Between 7am to 6pm - Pager - [phone] After 6pm go to [email or web address] - password [email or web address] [facility]  Office  [phone]  CC: Primary care physician; [name], MD  Note: This dictation was prepared with Dragon dictation along with smaller phrase technology. Any transcriptional errors that result from this process are unintentional.

## 2017-09-06 NOTE — Progress Notes (Signed)
Hematology/Oncology Consult note [facility]  Telephone:([phone] [phone]  Patient Care Team: [name], MD as PCP - General (Pediatrics)   Name of the patient: Adam Wall  [id number]  12-27-1945   Date of visit: 09/06/2017  Interval history- overall he feels better since admission. His foley is out and he is trying to void on his own. He did walk around the room and to the bathroom and back. Says back pain is not bad at this time  ECOG PS- 2 Pain scale- 4 Opioid associated constipation- no  Review of systems- Review of Systems  Constitutional: Positive for malaise/fatigue.  Cardiovascular: Positive for leg swelling.  Musculoskeletal: Positive for back pain.      No Known Allergies   Past Medical History:  Diagnosis Date  . AICD (automatic cardioverter/defibrillator) present   . Anemia   . Blockage of coronary artery of heart ([place])   . CHF (congestive heart failure) ([place])   . Chronic kidney disease 05/22/2017   nephrectomy-right  . Diabetes mellitus without complication ([place])   . Dysrhythmia   . Heart murmur   . Hypertension   . Hypochloremia   . LBBB (left bundle branch block)   . Neuropathy   . Presence of permanent cardiac pacemaker   . Right renal mass   . Smoldering myeloma [facility])      Past Surgical History:  Procedure Laterality Date  . CARDIAC SURGERY    . CORONARY ARTERY BYPASS GRAFT    . ENDOBRONCHIAL ULTRASOUND N/A 04/03/2017   Procedure: ENDOBRONCHIAL ULTRASOUND;  Surgeon: Flora Lipps, MD;  Location: [facility] ORS;  Service: Cardiopulmonary;  Laterality: N/A;  . Heart Bypass    . PACEMAKER PLACEMENT     ICD    Social History   Socioeconomic History  . Marital status: Married    Spouse name: Not on file  . Number of children: Not on file  . Years of education: Not on file  . Highest education level: Not on file  Social Needs  . Financial resource strain: Not on file  . Food insecurity - worry: Not  on file  . Food insecurity - inability: Not on file  . Transportation needs - medical: Not on file  . Transportation needs - non-medical: Not on file  Occupational History  . Not on file  Tobacco Use  . Smoking status: Former Smoker    Years: 10.00    Types: Cigars    Last attempt to quit: 10/04/1993    Years since quitting: 23.9  . Smokeless tobacco: Never Used  . Tobacco comment: 3 CIGARS PER DAY  Substance and Sexual Activity  . Alcohol use: No  . Drug use: No  . Sexual activity: Not on file  Other Topics Concern  . Not on file  Social History Narrative  . Not on file    Family History  Adopted: Yes  Problem Relation Age of Onset  . Heart failure Paternal Grandmother      Current Facility-Administered Medications:  .  acetaminophen (TYLENOL) tablet 650 mg, 650 mg, Oral, Q6H PRN **OR** acetaminophen (TYLENOL) suppository 650 mg, 650 mg, Rectal, Q6H PRN, Wieting, Richard, MD .  acyclovir (ZOVIRAX) 200 MG capsule 400 mg, 400 mg, Oral, Daily, Leslye Peer, Richard, MD, 400 mg at 09/05/17 1203 .  aspirin EC tablet 81 mg, 81 mg, Oral, Daily, [name], MD, 81 mg at 09/05/17 1203 .  atorvastatin (LIPITOR) tablet 40 mg, 40 mg, Oral, QHS, [name], MD, 40  mg at 09/05/17 2120 .  carvedilol (COREG) tablet 25 mg, 25 mg, Oral, BID WC, Leslye Peer, Richard, MD, 25 mg at 09/06/17 0841 .  [START ON 09/07/2017] fentaNYL (DURAGESIC - dosed mcg/hr) 12.5 mcg, 12.5 mcg, Transdermal, Q72H, Wieting, Richard, MD .  hydrALAZINE (APRESOLINE) tablet 25 mg, 25 mg, Oral, TID, Leslye Peer, Richard, MD, 25 mg at 09/05/17 2120 .  insulin aspart (novoLOG) injection 0-9 Units, 0-9 Units, Subcutaneous, TID AC & HS, Lance Coon, MD, 3 Units at 09/06/17 [phone] .  insulin detemir (LEVEMIR) injection 6 Units, 6 Units, Subcutaneous, Daily, Mody, Sital, MD, 6 Units at 09/05/17 1210 .  MEDLINE mouth rinse, 15 mL, Mouth Rinse, BID, Mody, Sital, MD, 15 mL at 09/05/17 2120 .  multivitamin liquid 15 mL, 15 mL, Oral,  Daily, Mody, Sital, MD, 15 mL at 09/05/17 1746 .  ondansetron (ZOFRAN) injection 4 mg, 4 mg, Intravenous, Q6H PRN, [name], MD, 4 mg at 09/04/17 2104 .  oxyCODONE (Oxy IR/ROXICODONE) immediate release tablet 5 mg, 5 mg, Oral, Q6H PRN, Wieting, Richard, MD .  pantoprazole (PROTONIX) injection 40 mg, 40 mg, Intravenous, Q12H, [name], MD, 40 mg at 09/05/17 2120 .  protein supplement (PREMIER PROTEIN) liquid, 11 oz, Oral, BID BM, Mody, Sital, MD .  tamsulosin (FLOMAX) capsule 0.4 mg, 0.4 mg, Oral, Daily, Wieting, Richard, MD, 0.4 mg at 09/05/17 1203 .  vitamin B-12 (CYANOCOBALAMIN) tablet 1,000 mcg, 1,000 mcg, Oral, Daily, Leslye Peer, Richard, MD, 1,000 mcg at 09/05/17 1203 .  vitamin C (ASCORBIC ACID) tablet 500 mg, 500 mg, Oral, Daily, Leslye Peer, Richard, MD, 500 mg at 09/05/17 1203  Physical exam:  Vitals:   09/05/17 1203 09/05/17 1217 09/05/17 2014 09/06/17 0534  BP: (!) 146/60 (!) 142/63 (!) 163/64 (!) 167/44  Pulse:  82 78 71  Resp:  18 20 20   Temp:  98.1 F (36.7 C) 98.7 F (37.1 C) 98 F (36.7 C)  TempSrc:  Oral Oral Oral  SpO2:  97% 99% 99%  Weight:      Height:       Physical Exam  Constitutional: He is oriented to person, place, and time.  Well developed. No acute distress. Mild fatigue  HENT:  Head: Normocephalic and atraumatic.  Eyes: EOM are normal. Pupils are equal, round, and reactive to light.  Neck: Normal range of motion.  Cardiovascular: Normal rate, regular rhythm and normal heart sounds.  Pulmonary/Chest: Effort normal and breath sounds normal.  Abdominal: Soft. Bowel sounds are normal.  Musculoskeletal: He exhibits edema.  Neurological: He is alert and oriented to person, place, and time.  Skin: Skin is warm and dry.     CMP Latest Ref Rng & Units 09/06/2017  Glucose 65 - 99 mg/dL 261(H)  BUN 6 - 20 mg/dL 72(H)  Creatinine 0.61 - 1.24 mg/dL 2.63(H)  Sodium 135 - 145 mmol/L 140  Potassium 3.5 - 5.1 mmol/L 4.3  Chloride 101 - 111 mmol/L  113(H)  CO2 22 - 32 mmol/L 23  Calcium 8.9 - 10.3 mg/dL 7.6(L)  Total Protein 6.5 - 8.1 g/dL -  Total Bilirubin 0.3 - 1.2 mg/dL -  Alkaline Phos 38 - 126 U/L -  AST 15 - 41 U/L -  ALT 17 - 63 U/L -   CBC Latest Ref Rng & Units 09/06/2017  WBC 3.8 - 10.6 K/uL 8.4  Hemoglobin 13.0 - 18.0 g/dL 8.9(L)  Hematocrit 40.0 - 52.0 % 27.9(L)  Platelets 150 - 440 K/uL 36(L)    @IMAGES @  Dg Chest 2 View  Result Date: 09/04/2017 CLINICAL DATA:  Increased shortness of breath especially upon exertion, known compression fractures, history of CHF, hypertension, neuropathy, pacemaker, myeloma EXAM: CHEST  2 VIEW COMPARISON:  Chest x-ray dated 09/02/2014. FINDINGS: Stable cardiomegaly. Median sternotomy wires appear intact and stable in alignment, related to presumed CABG. Aortic atherosclerosis. Pacemaker/ICD apparatus in place. Lungs appear clear. No pleural effusion or pneumothorax seen. Mild degenerative spurring within the thoracic spine. No acute or suspicious osseous finding. IMPRESSION: No active cardiopulmonary disease. No evidence of pneumonia or pulmonary edema. Stable cardiomegaly. Aortic atherosclerosis. Electronically Signed   By: [name] M.D.   On: 09/04/2017 17:06   Ct Lumbar Spine Wo Contrast  Result Date: 08/28/2017 CLINICAL DATA:  Followup abnormal PET scan at L5. History of multiple myeloma. EXAM: CT LUMBAR SPINE WITHOUT CONTRAST TECHNIQUE: Multidetector CT imaging of the lumbar spine was performed without intravenous contrast administration. Multiplanar CT image reconstructions were also generated. COMPARISON:  PET scan 08/25/2017 and 10/28/2016. FINDINGS: Segmentation: 5 lumbar type vertebral bodies. Alignment: Normal Vertebrae: Old superior endplate Schmorl's nodes/fractures at L1 and L3. Acute or subacute fracture at the superior endplate of L5 with loss of height centrally of about 25%. No retropulsed bone. The bone underlying the fracture appears somewhat lucent, suggesting that  there could be an underlying marrow space lesion. No extraosseous extension of tumor, if present. Lytic areas noted in both iliac bones consistent with the history of myeloma. Paraspinal and other soft tissues: No significant paraspinous soft tissue finding. Disc levels: Solid bridging osteophytes from the thoracic region through L4. Mild chronic disc bulges and facet arthritis without likely significant stenosis. IMPRESSION: Acute or subacute superior endplate fracture at L5 with loss of height centrally of 25%. Slight lucency at the bone underlying the fracture raises the possibility of an underlying marrow space lesion. Lucent areas in both iliac bones suggesting the presence of myeloma in those regions. Electronically Signed   By: [name] M.D.   On: 08/28/2017 11:19   Nm Pet Image Restage (ps) Whole Body  Result Date: 08/25/2017 CLINICAL DATA:  The the treatment strategy for tumor type. EXAM: NUCLEAR MEDICINE PET WHOLE BODY TECHNIQUE: mCi F-18 FDG was injected intravenously. Full-ring PET imaging was performed from the vertex to the feet after the radiotracer. CT data was obtained and used for attenuation correction and anatomic localization. FASTING BLOOD GLUCOSE:  Value:  mg/dl COMPARISON:  None. FINDINGS: HEAD/NECK No hypermetabolic activity in the scalp. No hypermetabolic cervical lymph nodes. CHEST Persistent hypermetabolic foci identified in the mediastinum and hilar regions. This is similar but slightly decreased compared to prior study. Index prevascular lymph node demonstrates SUV max = 5.4. The heart is enlarged. Left-sided pacer/AICD again noted. Mild FDG uptake at the area around the battery pack is probably related to granulation tissue. Distal esophageal uptake noted previously is less apparent today. ABDOMEN/PELVIS No abnormal hypermetabolic activity within the liver, pancreas, adrenal glands, or spleen. No hypermetabolic lymph nodes in the abdomen or pelvis. There is abdominal aortic  atherosclerosis without aneurysm. Stable 16 mm low-density splenic lesion without hypermetabolic FDG accumulation. SKELETON New uptake is seen diffusely in the L5 vertebral body which has become much more heterogeneous in the interval with areas of lucency. There may be a fracture line in the vertebral body visible on image 227. Activity seen previously and T8 and T10 is not readily evident today. The bony mineralization remains diffusely heterogeneous EXTREMITIES Uptake is identified in the soft tissues posterior to the right calcaneus. This is associated with  soft tissue edema in the lower legs bilaterally. IMPRESSION: 1. Interval development of hypermetabolism in the L5 vertebral body. Axial CT imaging raises the question of fracture, possibly pathologic. 2. Activity seen previously at T8 and T10 is not evident on today's exam. 3. Similar but less conspicuous hypermetabolic lymph nodes in the mediastinum and hilar regions. 4. Focal hypermetabolism in the soft tissues posterior to the right calcaneus with diffuse soft tissue edema seen in the lower extremities bilaterally. Electronically Signed   By: Misty Stanley M.D.   On: 08/25/2017 13:50   Ct Bone Marrow Biopsy & Aspiration  Result Date: 08/20/2017 CLINICAL DATA:  Multiple myeloma. Need for bone marrow biopsy to establish status of plasma cells in the bone marrow. EXAM: CT GUIDED BONE MARROW ASPIRATION AND BIOPSY ANESTHESIA/SEDATION: Versed 3.0 mg IV, Fentanyl 75 mcg IV Total Moderate Sedation Time:  17 minutes. The patient's level of consciousness and physiologic status were continuously monitored during the procedure by Radiology nursing. PROCEDURE: The procedure risks, benefits, and alternatives were explained to the patient. Questions regarding the procedure were encouraged and answered. The patient understands and consents to the procedure. A time out was performed prior to initiating the procedure. The right gluteal region was prepped with  chlorhexidine. Sterile gown and sterile gloves were used for the procedure. Local anesthesia was provided with 1% Lidocaine. Under CT guidance, an 11 gauge On Control bone cutting needle was advanced from a posterior approach into the right iliac bone. Needle positioning was confirmed with CT. Initial non heparinized and heparinized aspirate samples were obtained of bone marrow. Core biopsy was performed via the On Control drill needle. COMPLICATIONS: None FINDINGS: Inspection of initial aspirate did reveal visible particles. Intact core biopsy sample was obtained. IMPRESSION: CT guided bone marrow biopsy of right posterior iliac bone with both aspirate and core samples obtained. Electronically Signed   By: Aletta Edouard M.D.   On: 08/20/2017 09:58     Assessment and plan- Patient is a 72 y.o. male with IgG lambda multiple myeloma admitted for uremic symptoms and need for possible dialysis  1. Multiple myeloma- revlimid is on hold. Last dose of velcade was on 09/04/17. His thrombocytopenia is due to chemotherapy. Continue to monitor. If platelet count <10 or clinical bleeding, give platelet transfusion. I will plan to switch to RVd lite treatment as an outpatient once counts improve  2. 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 Kidney functions improving after starting myeloma treatment. He may be able to avoid dialysis  3. L5 pathological fracture: Patient needs kyphoplasty given his significant back pain. He ideally needs kyphoplasty as an inpatient given that he is overall frail and may not be able to get it done as an outpatient. I will touch base with Dr. Anselm Pancoast on Monday to see if it can be done on Monday. His thromocytopenia may be an issue and he may need platelet transfusion before the procedure to keep platelet counts >50. If kyphoplasty is not possible on Monday, he can be discharged and I will try to get it done as an outpatient. Please check daily cbc   Will continue to follow   Visit  Diagnosis 1. Bilateral back pain, unspecified back location, unspecified chronicity   2. Dyspnea, unspecified type   3. Orthopnea   4. Urinary retention   5. Weakness   6. SOB (shortness of breath)      Dr. Randa Evens, MD, MPH [place] at [facility] Pager- [phone]

## 2017-09-06 NOTE — Progress Notes (Signed)
[facility]  ROUNDING NOTE   Subjective:   Laying in bed.   UOP 2661m  Creatinine 2.63 (2.95)  Objective:  Vital signs in last 24 hours:  Temp:  [97.5 F (36.4 C)-98.7 F (37.1 C)] 97.5 F (36.4 C) (01/12 1139) Pulse Rate:  [71-80] 80 (01/12 1139) Resp:  [20] 20 (01/12 0534) BP: (157-167)/(44-64) 157/50 (01/12 1139) SpO2:  [99 %] 99 % (01/12 1139)  Weight change:  Filed Weights   09/04/17 1520  Weight: 95.3 kg (210 lb)    Intake/Output: I/O last 3 completed shifts: In: 11696[P.O.:1080; I.V.:254] Out: 3725 [[VELFY:1017]  Intake/Output this shift:  Total I/O In: 120 [P.O.:120] Out: -   Physical Exam: General: NAD, laying in bed  Head: Normocephalic, atraumatic. Moist oral mucosal membranes  Eyes: Anicteric, PERRL  Neck: Supple, trachea midline  Lungs:  Clear to auscultation  Heart: Regular rate and rhythm  Abdomen:  Soft, nontender,   Extremities:  + peripheral edema.  Neurologic: Nonfocal, moving all four extremities  Skin: No lesions       Basic Metabolic Panel: Recent Labs  Lab 09/04/17 1200 09/05/17 0421 09/06/17 0610  NA 133* 139 140  K 4.9 4.4 4.3  CL 105 111 113*  CO2 _0 GLUCOSE 467* 305* 261*  BUN 110* 97* 72*  CREATININE 3.07* 2.95* 2.63*  CALCIUM 7.6* 7.8* 7.6*    Liver Function Tests: Recent Labs  Lab 09/04/17 1200  AST 18  ALT 54  ALKPHOS 81  BILITOT 0.9  PROT 6.5  ALBUMIN 2.9*   No results for input(s): LIPASE, AMYLASE in the last 168 hours. No results for input(s): AMMONIA in the last 168 hours.  CBC: Recent Labs  Lab 09/04/17 1200 09/04/17 1325 09/05/17 0421 09/06/17 0610  WBC 8.8 8.6 7.8 8.4  NEUTROABS 8.2*  --   --   --   HGB 8.8* 8.9* 8.2* 8.9*  HCT 27.3* 28.5* 26.0* 27.9*  MCV 70.8* 71.1* 70.6* 70.5*  PLT 46* 47* 41* 36*    Cardiac Enzymes: Recent Labs  Lab 09/04/17 1512 09/04/17 2037  TROPONINI <0.03 <0.03    BNP: Invalid input(s): POCBNP  CBG: Recent Labs  Lab  09/04/17 2101 09/04/17 2311 09/05/17 0102 09/05/17 0800 09/05/17 1153  GLUCAP 439* 398* 309* 302* 358    Microbiology: Results for orders placed or performed during the hospital encounter of 09/04/17  Urine Culture     Status: Abnormal   Collection Time: 09/04/17  6:45 PM  Result Value Ref Range Status   Specimen Description   Final    URINE, RANDOM [facility] [address] [place] [address]   Special Requests   Final    NONE [facility] [address], [place] [address]   Culture MULTIPLE SPECIES PRESENT, SUGGEST RECOLLECTION (A)  Final   Report Status 09/06/2017 FINAL  Final    Coagulation Studies: Recent Labs    09/04/17 1325  LABPROT 13.6  INR 1.05    Urinalysis: Recent Labs    09/04/17 1845  COLORURINE YELLOW  LABSPEC <1.005*  PHURINE 5.5  GLUCOSEU 500*  HGBUR NEGATIVE  BILIRUBINUR NEGATIVE  KETONESUR NEGATIVE  PROTEINUR 30*  NITRITE NEGATIVE  LEUKOCYTESUR TRACE*      Imaging: Dg Chest 2 View  Result Date: 09/04/2017 CLINICAL DATA:  Increased shortness of breath especially upon exertion, known compression fractures, history of CHF, hypertension, neuropathy, pacemaker, myeloma EXAM: CHEST  2 VIEW  COMPARISON:  Chest x-ray dated 09/02/2014. FINDINGS: Stable cardiomegaly. Median sternotomy wires appear intact and stable in alignment, related to presumed CABG. Aortic atherosclerosis. Pacemaker/ICD apparatus in place. Lungs appear clear. No pleural effusion or pneumothorax seen. Mild degenerative spurring within the thoracic spine. No acute or suspicious osseous finding. IMPRESSION: No active cardiopulmonary disease. No evidence of pneumonia or pulmonary edema. Stable cardiomegaly. Aortic atherosclerosis. Electronically Signed   By: [name] M.D.   On: 09/04/2017 17:06     Medications:    . acyclovir  400 mg Oral Daily  . aspirin EC  81 mg Oral Daily  . atorvastatin  40 mg Oral  QHS  . carvedilol  25 mg Oral BID WC  . [START ON 09/07/2017] fentaNYL  12.5 mcg Transdermal Q72H  . hydrALAZINE  25 mg Oral TID  . insulin aspart  0-9 Units Subcutaneous TID AC & HS  . insulin detemir  6 Units Subcutaneous Daily  . mouth rinse  15 mL Mouth Rinse BID  . multivitamin  15 mL Oral Daily  . pantoprazole (PROTONIX) IV  40 mg Intravenous Q12H  . protein supplement shake  11 oz Oral BID BM  . tamsulosin  0.4 mg Oral Daily  . vitamin B-12  1,000 mcg Oral Daily  . vitamin C  500 mg Oral Daily   acetaminophen **OR** acetaminophen, ondansetron (ZOFRAN) IV, oxyCODONE  Assessment/ Plan:  Mr. Adam Wall is a 72 y.o. black male with multiple myeloma, right nephrectomy secondary to renal cell carcinoma, diastolic congestive heart failure, anemia/thalessemia trait, AICD placement, diabetes mellitus type II, coronary artery disease status post CABG, who was admitted to [facility]  1. Acute renal failure on chronic kidney disease stage IV with proteinuria Chronic kidney disease and proteinuria secondary to nephrectomy, multiple myeloma, diabetes and hypertension.  Baseline creatinine of 3.26, GFR of 20 on 07/30/17 Acute renal failure secondary to prerenal azotemia and obstructive uropathy. Creatinine and GFR have improved with nonoliguric urine output with IV fluids foley catheter placement.  - check renal ultrasound - remove foley catheter.   2. Hyponatremia: secondary to hypovolemia - improved to normal range with normal saline  3. Anemia with chronic kidney disease: microcytic. also with multiple myeloma and thrombocytopenia. Concern that this could be due to revlimid and velcade.  Thalessemia trait - Appreciate Oncology input.   4. Diabetes mellitus type II with chronic kidney disease: insulin dependent.     LOS: 2 Adam Wall 1/12/201912:44 PM

## 2017-09-06 NOTE — Progress Notes (Signed)
MD notified of low platelet count. Okay to d/c heparin shots for now. WIll continue to monitor.

## 2017-09-06 NOTE — Evaluation (Signed)
Physical Therapy Cancellation Note Patient Details Name: Adam Wall MRN: [id number] DOB: August 14, 1946 Today's Date: 09/06/2017        Clinical Impression  Pt refused therapy, stating that he "could not make any promises" when asked if he would try later today.  Pt stated that tomorrow would be better for him to do PT.  Will re-attempt at a later time.                       Roxanne Gates, PT, DPT   Roxanne Gates 09/06/2017, 12:21 PM

## 2017-09-07 LAB — CBC WITH DIFFERENTIAL/PLATELET
BASOS ABS: 0 10*3/uL (ref 0–0.1)
Basophils Relative: 0 %
Eosinophils Absolute: 0.1 10*3/uL (ref 0–0.7)
Eosinophils Relative: 1 %
HEMATOCRIT: 27.7 % — AB (ref 40.0–52.0)
Hemoglobin: 8.7 g/dL — ABNORMAL LOW (ref 13.0–18.0)
LYMPHS PCT: 5 %
Lymphs Abs: 0.4 10*3/uL — ABNORMAL LOW (ref 1.0–3.6)
MCH: 22.5 pg — ABNORMAL LOW (ref 26.0–34.0)
MCHC: 31.6 g/dL — ABNORMAL LOW (ref 32.0–36.0)
MCV: 71.3 fL — AB (ref 80.0–100.0)
Monocytes Absolute: 1.3 10*3/uL — ABNORMAL HIGH (ref 0.2–1.0)
Monocytes Relative: 16 %
NEUTROS ABS: 6.4 10*3/uL (ref 1.4–6.5)
Neutrophils Relative %: 78 %
Platelets: 26 10*3/uL — CL (ref 150–440)
RBC: 3.88 MIL/uL — AB (ref 4.40–5.90)
RDW: 18.7 % — ABNORMAL HIGH (ref 11.5–14.5)
WBC: 8.1 10*3/uL (ref 3.8–10.6)

## 2017-09-07 LAB — BASIC METABOLIC PANEL
ANION GAP: 6 (ref 5–15)
BUN: 59 mg/dL — ABNORMAL HIGH (ref 6–20)
CO2: 22 mmol/L (ref 22–32)
Calcium: 7.4 mg/dL — ABNORMAL LOW (ref 8.9–10.3)
Chloride: 112 mmol/L — ABNORMAL HIGH (ref 101–111)
Creatinine, Ser: 2.41 mg/dL — ABNORMAL HIGH (ref 0.61–1.24)
GFR calc Af Amer: 29 mL/min — ABNORMAL LOW (ref >60)
GFR calc non Af Amer: 25 mL/min — ABNORMAL LOW (ref >60)
Glucose, Bld: 211 mg/dL — ABNORMAL HIGH (ref 65–99)
Potassium: 4 mmol/L (ref 3.5–5.1)
SODIUM: 140 mmol/L (ref 135–145)

## 2017-09-07 LAB — C DIFFICILE QUICK SCREEN W PCR REFLEX
C DIFFICILE (CDIFF) INTERP: NOT DETECTED
C DIFFICILE (CDIFF) TOXIN: NEGATIVE
C Diff antigen: NEGATIVE

## 2017-09-07 MED ORDER — TAMSULOSIN HCL 0.4 MG PO CAPS: 0.8000 mg | ORAL_CAPSULE | Freq: Every day | ORAL | Status: DC

## 2017-09-07 MED ORDER — LOPERAMIDE HCL 2 MG PO CAPS
2.0000 mg | ORAL_CAPSULE | Freq: Four times a day (QID) | ORAL | Status: DC
  Administered 2017-09-07 (×2): 2 mg via ORAL
  Filled 2017-09-07 (×3): qty 1

## 2017-09-07 NOTE — Progress Notes (Signed)
Inpatient Diabetes Program Recommendations  AACE/ADA: New Consensus Statement on Inpatient Glycemic Control (2015)  Target Ranges:  Prepandial:   less than 140 mg/dL      Peak postprandial:   less than 180 mg/dL (1-2 hours)      Critically ill patients:  140 - 180 mg/dL   Lab Results  Component Value Date   GLUCAP 335 (H) 09/05/2017    Review of Glycemic Control  FBS improving, but still above goal. Post-prandials elevated. Needs insulin adjustment.  Inpatient Diabetes Program Recommendations:     Increase Levemir to 6 units bid Add Novolog 3 units tidwc for meal coverage insulin if pt eats > 50% meal  Continue to follow.  Thank you. Lorenda Peck, RD, LDN, CDE Inpatient Diabetes Coordinator [phone]

## 2017-09-07 NOTE — Evaluation (Signed)
Physical Therapy Evaluation Patient Details Name: Adam Wall MRN: [id number] DOB: 05/21/1946 Today's Date: 09/07/2017      Reason for Cancellation:   Pt refused evaluation, stating that he has been to the bathroom 4x already this morning and has not been able to eat.  He reports that he is too weak to do anything at this point.  PT instructed pt to notify someone if he feels differently later today.            Roxanne Gates, PT, DPT   Roxanne Gates 09/07/2017, 1:34 PM

## 2017-09-07 NOTE — Progress Notes (Signed)
[facility] NAME: Adam Wall    MR#:  [id number]  DATE OF BIRTH:  Feb 04, 1946  SUBJECTIVE:   Pt having diarrhea states back pain improved  REVIEW OF SYSTEMS:    Review of Systems  Constitutional: Negative for fever, chills weight loss HENT: Negative for ear pain, nosebleeds, congestion, facial swelling, rhinorrhea, neck pain, neck stiffness and ear discharge.   Respiratory: Negative for cough, shortness of breath, wheezing  Cardiovascular: Negative for chest pain, palpitations and leg swelling.  Gastrointestinal: Negative for heartburn, abdominal pain, vomiting,+ diarrhea or no consitpation Genitourinary: Negative for dysuria, urgency, frequency, hematuria Musculoskeletal: Negative for back pain or joint pain Neurological: Negative for dizziness, seizures, syncope, focal weakness,  numbness and headaches.  Hematological: Does not bruise/bleed easily.  Psychiatric/Behavioral: Negative for hallucinations, confusion, dysphoric mood    Tolerating Diet: yes      DRUG ALLERGIES:  No Known Allergies  VITALS:  Blood pressure (!) 162/64, pulse 79, temperature 98.8 F (37.1 C), temperature source Oral, resp. rate 20, height 6' (1.829 m), weight 210 lb (95.3 kg), SpO2 99 %.  PHYSICAL EXAMINATION:  Constitutional: Appears well-developed and well-nourished. No distress. HENT: Normocephalic. Marland Kitchen Oropharynx is clear and moist.  Eyes: Conjunctivae and EOM are normal. PERRLA, no scleral icterus.  Neck: Normal ROM. Neck supple. No JVD. No tracheal deviation. CVS: RRR, S1/S2 +, no murmurs, no gallops, no carotid bruit.  Pulmonary: Effort and breath sounds normal, no stridor, rhonchi, wheezes, rales.  Abdominal: Soft. BS +,  no distension, tenderness, rebound or guarding.  Musculoskeletal: Normal range of motion. No edema and no tenderness.  Neuro: Alert. CN 2-12 grossly intact. No focal deficits. Skin: Skin is warm and dry. No rash  noted. Psychiatric: Normal mood and affect.      LABORATORY PANEL:   CBC Recent Labs  Lab 09/06/17 0610  WBC 8.4  HGB 8.9*  HCT 27.9*  PLT 36*   ------------------------------------------------------------------------------------------------------------------  Chemistries  Recent Labs  Lab 09/04/17 1200  09/07/17 0658  NA 133*   < > 140  K 4.9   < > 4.0  CL 105   < > 112*  CO2 22   < > 22  GLUCOSE 467*   < > 211*  BUN 110*   < > 59*  CREATININE 3.07*   < > 2.41*  CALCIUM 7.6*   < > 7.4*  AST 18  --   --   ALT 54  --   --   ALKPHOS 81  --   --   BILITOT 0.9  --   --    < > = values in this interval not displayed.   ------------------------------------------------------------------------------------------------------------------  Cardiac Enzymes Recent Labs  Lab 09/04/17 1512 09/04/17 2037  TROPONINI <0.03 <0.03   ------------------------------------------------------------------------------------------------------------------  RADIOLOGY:  US Renal  Result Date: 09/06/2017 CLINICAL DATA:  Acute kidney failure EXAM: RENAL / URINARY TRACT ULTRASOUND COMPLETE COMPARISON:  CT 10/27/2012 FINDINGS: Right Kidney: Length: Surgically absent. Left Kidney: Length: 11.9 cm. Echogenicity within normal limits. No mass or hydronephrosis visualized. Bladder: Foley catheter in place, decompressed. IMPRESSION: Prior right nephrectomy. Left kidney unremarkable. Electronically Signed   By: [name] M.D.   On: 09/06/2017 15:48     ASSESSMENT AND PLAN:   72 y/o male with history of IgG multiple myeloma, chronic kidney disease stage III status post right nephrectomy and diabetes who was sent to the ER by interventional radiology due to low platelets and complaint of difficulty  swallowing.  1. Dysphagia:  Doing better patient was able to eat Seen by ENT no obstruction noted   2. Diarrhea-likely related to his chemo Stool C. difficile negative I will start patient on  Imodium   3. Chronic kidney disease status post right nephrectomy: Continue IV fluids Renal function improved  4. History of multiple myeloma with chronic thrombocytopenia and anemia due to multiple myeloma and chemotherapy Oncology evaluation appreciated  5. L5 vertebral fracture: Plan was for kyphoplasty  Per oncology plan is for interventional radiology to do this procedure on Monday   6. Diabetes: Diabetes nurse consult appreciated Continue sliding scale with levemir  7. BPH with urinary retention: Continue Flomax     CODE STATUS: FULL  TOTAL TIME TAKING CARE OF THIS PATIENT: 30 minutes.     POSSIBLE D/C 2-3 days, DEPENDING ON CLINICAL CONDITION.   Dustin Flock M.D on 09/07/2017 at 1:58 PM  Between 7am to 6pm - Pager - [phone] After 6pm go to [email or web address] - password [email or web address] [facility]  Office  [phone]  CC: Primary care physician; [name], MD  Note: This dictation was prepared with Dragon dictation along with smaller phrase technology. Any transcriptional errors that result from this process are unintentional.

## 2017-09-07 NOTE — Progress Notes (Signed)
[facility]  ROUNDING NOTE   Subjective:   Laying in bed.   Creatinine 2.41(2.63)   tamsulosin restarted. Foley catheter removed.   Objective:  Vital signs in last 24 hours:  Temp:  [97.5 F (36.4 C)-99.4 F (37.4 C)] 98.8 F (37.1 C) (01/13 0429) Pulse Rate:  [71-80] 73 (01/13 0429) Resp:  [18-20] 20 (01/13 0429) BP: (151-166)/(50-55) 151/55 (01/13 1035) SpO2:  [99 %] 99 % (01/13 0429)  Weight change:  Filed Weights   09/04/17 1520  Weight: 95.3 kg (210 lb)    Intake/Output: I/O last 3 completed shifts: In: 240 [P.O.:240] Out: 1750 [Urine:1750]   Intake/Output this shift:  No intake/output data recorded.  Physical Exam: General: NAD, laying in bed  Head: Normocephalic, atraumatic. Moist oral mucosal membranes  Eyes: Anicteric, PERRL  Neck: Supple, trachea midline  Lungs:  Clear to auscultation  Heart: Regular rate and rhythm  Abdomen:  Soft, nontender,   Extremities:  + peripheral edema.  Neurologic: Nonfocal, moving all four extremities  Skin: No lesions       Basic Metabolic Panel: Recent Labs  Lab 09/04/17 1200 09/05/17 0421 09/06/17 0610 09/07/17 0658  NA 133* 139 140 140  K 4.9 4.4 4.3 4.0  CL 105 111 113* 112*  CO2 22 22 23 22   GLUCOSE 467* 305* 261* 211*  BUN 110* 97* 72* 59*  CREATININE 3.07* 2.95* 2.63* 2.41*  CALCIUM 7.6* 7.8* 7.6* 7.4*    Liver Function Tests: Recent Labs  Lab 09/04/17 1200  AST 18  ALT 54  ALKPHOS 81  BILITOT 0.9  PROT 6.5  ALBUMIN 2.9*   No results for input(s): LIPASE, AMYLASE in the last 168 hours. No results for input(s): AMMONIA in the last 168 hours.  CBC: Recent Labs  Lab 09/04/17 1200 09/04/17 1325 09/05/17 0421 09/06/17 0610  WBC 8.8 8.6 7.8 8.4  NEUTROABS 8.2*  --   --   --   HGB 8.8* 8.9* 8.2* 8.9*  HCT 27.3* 28.5* 26.0* 27.9*  MCV 70.8* 71.1* 70.6* 70.5*  PLT 46* 47* 41* 36*    Cardiac Enzymes: Recent Labs  Lab 09/04/17 1512 09/04/17 2037  TROPONINI <0.03 <0.03     BNP: Invalid input(s): POCBNP  CBG: Recent Labs  Lab 09/04/17 2101 09/04/17 2311 09/05/17 0102 09/05/17 0800 09/05/17 1153  GLUCAP 439* 398* 309* 302* 5*    Microbiology: Results for orders placed or performed during the hospital encounter of 09/04/17  Urine Culture     Status: Abnormal   Collection Time: 09/04/17  6:45 PM  Result Value Ref Range Status   Specimen Description   Final    URINE, RANDOM Performed at [facility], [address], [place], [place] [address]    Special Requests   Final    NONE Performed at [facility], [address] [place], [place] [address]    Culture MULTIPLE SPECIES PRESENT, SUGGEST RECOLLECTION (A)  Final   Report Status 09/06/2017 FINAL  Final    Coagulation Studies: Recent Labs    09/04/17 1325  LABPROT 13.6  INR 1.05    Urinalysis: Recent Labs    09/04/17 1845  COLORURINE YELLOW  LABSPEC <1.005*  PHURINE 5.5  GLUCOSEU 500*  HGBUR NEGATIVE  BILIRUBINUR NEGATIVE  KETONESUR NEGATIVE  PROTEINUR 30*  NITRITE NEGATIVE  LEUKOCYTESUR TRACE*      Imaging: US Renal  Result Date: 09/06/2017 CLINICAL DATA:  Acute kidney failure EXAM: RENAL / URINARY TRACT ULTRASOUND COMPLETE COMPARISON:  CT 10/27/2012 FINDINGS:  Right Kidney: Length: Surgically absent. Left Kidney: Length: 11.9 cm. Echogenicity within normal limits. No mass or hydronephrosis visualized. Bladder: Foley catheter in place, decompressed. IMPRESSION: Prior right nephrectomy. Left kidney unremarkable. Electronically Signed   By: [name] M.D.   On: 09/06/2017 15:48     Medications:    . acyclovir  400 mg Oral Daily  . aspirin EC  81 mg Oral Daily  . atorvastatin  40 mg Oral QHS  . carvedilol  25 mg Oral BID WC  . fentaNYL  12.5 mcg Transdermal Q72H  . hydrALAZINE  25 mg Oral TID  . insulin aspart  0-9 Units Subcutaneous TID AC & HS  . insulin detemir  6 Units Subcutaneous Daily  . mouth rinse  15 mL Mouth Rinse BID  .  multivitamin  15 mL Oral Daily  . pantoprazole (PROTONIX) IV  40 mg Intravenous Q12H  . protein supplement shake  11 oz Oral BID BM  . [START ON 09/08/2017] tamsulosin  0.8 mg Oral Daily  . vitamin B-12  1,000 mcg Oral Daily  . vitamin C  500 mg Oral Daily   acetaminophen **OR** acetaminophen, ondansetron (ZOFRAN) IV, oxyCODONE  Assessment/ Plan:  Mr. Adam Wall is a 72 y.o. black male with multiple myeloma, right nephrectomy secondary to renal cell carcinoma, diastolic congestive heart failure, anemia/thalessemia trait, AICD placement, diabetes mellitus type II, coronary artery disease status post CABG, who was admitted to [facility]  1. Acute renal failure on chronic kidney disease stage IV with proteinuria Chronic kidney disease and proteinuria secondary to nephrectomy, multiple myeloma, diabetes and hypertension.  Baseline creatinine of 3.26, GFR of 20 on 07/30/17 Acute renal failure secondary to prerenal azotemia and obstructive uropathy. Creatinine and GFR have improved with nonoliguric urine output with IV fluids foley catheter placement.  Ultrasound reviewed.  - Encourage PO intake. No more dysphagia.   2. Hyponatremia: secondary to hypovolemia - improved to normal range with normal saline  3. Hypertension and edema:  - start compression stockings - Increase tamsulosin to 0.55m - holding furosemide due to acute renal failure and prerenal azotemia  4. Anemia with chronic kidney disease: microcytic. also with multiple myeloma and thrombocytopenia. Concern that this could be due to revlimid and velcade.  Thalessemia trait - Appreciate Oncology input.   5. Diabetes mellitus type II with chronic kidney disease: insulin dependent.     LOS: 3 Chassity Ludke 1/13/201911:16 AM

## 2017-09-08 ENCOUNTER — Ambulatory Visit: Payer: Medicare Other

## 2017-09-08 ENCOUNTER — Other Ambulatory Visit: Payer: Self-pay | Admitting: Oncology

## 2017-09-08 ENCOUNTER — Encounter: Payer: Self-pay | Admitting: Oncology

## 2017-09-08 DIAGNOSIS — C9000 Multiple myeloma not having achieved remission: Secondary | ICD-10-CM

## 2017-09-08 LAB — GLUCOSE, CAPILLARY
GLUCOSE-CAPILLARY: 320 mg/dL — AB (ref 65–99)
GLUCOSE-CAPILLARY: 256 mg/dL — AB (ref 65–99)
GLUCOSE-CAPILLARY: 194 mg/dL — AB (ref 65–99)
GLUCOSE-CAPILLARY: 179 mg/dL — AB (ref 65–99)
GLUCOSE-CAPILLARY: 193 mg/dL — AB (ref 65–99)
Glucose-Capillary: 368 mg/dL — ABNORMAL HIGH (ref 65–99)
Glucose-Capillary: 246 mg/dL — ABNORMAL HIGH (ref 65–99)
Glucose-Capillary: 293 mg/dL — ABNORMAL HIGH (ref 65–99)
Glucose-Capillary: 203 mg/dL — ABNORMAL HIGH (ref 65–99)
Glucose-Capillary: 202 mg/dL — ABNORMAL HIGH (ref 65–99)
Glucose-Capillary: 220 mg/dL — ABNORMAL HIGH (ref 65–99)
Glucose-Capillary: 174 mg/dL — ABNORMAL HIGH (ref 65–99)
Glucose-Capillary: 179 mg/dL — ABNORMAL HIGH (ref 65–99)

## 2017-09-08 LAB — CBC
HCT: 25.2 % — ABNORMAL LOW (ref 40.0–52.0)
Hemoglobin: 7.9 g/dL — ABNORMAL LOW (ref 13.0–18.0)
MCH: 22 pg — ABNORMAL LOW (ref 26.0–34.0)
MCHC: 31.2 g/dL — AB (ref 32.0–36.0)
MCV: 70.5 fL — ABNORMAL LOW (ref 80.0–100.0)
Platelets: 40 10*3/uL — ABNORMAL LOW (ref 150–440)
RBC: 3.58 MIL/uL — AB (ref 4.40–5.90)
RDW: 18.2 % — ABNORMAL HIGH (ref 11.5–14.5)
WBC: 5 10*3/uL (ref 3.8–10.6)

## 2017-09-08 LAB — RENAL FUNCTION PANEL
ALBUMIN: 2.2 g/dL — AB (ref 3.5–5.0)
ANION GAP: 6 (ref 5–15)
BUN: 46 mg/dL — ABNORMAL HIGH (ref 6–20)
CALCIUM: 6.9 mg/dL — AB (ref 8.9–10.3)
CO2: 21 mmol/L — ABNORMAL LOW (ref 22–32)
Chloride: 112 mmol/L — ABNORMAL HIGH (ref 101–111)
Creatinine, Ser: 2.19 mg/dL — ABNORMAL HIGH (ref 0.61–1.24)
GFR calc non Af Amer: 28 mL/min — ABNORMAL LOW (ref >60)
GFR, EST AFRICAN AMERICAN: 33 mL/min — AB (ref >60)
GLUCOSE: 163 mg/dL — AB (ref 65–99)
PHOSPHORUS: 2.1 mg/dL — AB (ref 2.5–4.6)
Potassium: 3.7 mmol/L (ref 3.5–5.1)
SODIUM: 139 mmol/L (ref 135–145)

## 2017-09-08 LAB — KAPPA/LAMBDA LIGHT CHAINS
KAPPA FREE LGHT CHN: 47.6 mg/L — AB (ref 3.3–19.4)
KAPPA, LAMDA LIGHT CHAIN RATIO: 4.07 — AB (ref 0.26–1.65)
Lambda free light chains: 11.7 mg/L (ref 5.7–26.3)

## 2017-09-08 MED ORDER — OXYCODONE HCL 5 MG PO TABS: 5.0000 mg | ORAL_TABLET | Freq: Four times a day (QID) | ORAL | 0 refills | Status: DC | PRN

## 2017-09-08 MED ORDER — LOPERAMIDE HCL 2 MG PO CAPS: 2.0000 mg | ORAL_CAPSULE | ORAL | 0 refills | Status: DC | PRN

## 2017-09-08 MED ORDER — FENTANYL 12 MCG/HR TD PT72: 12.5000 ug | MEDICATED_PATCH | TRANSDERMAL | 0 refills | Status: DC

## 2017-09-08 NOTE — Care Management (Signed)
Patient to discharge home today.  Patient lives at home with wife.  PCP [facility].  Wife provides transportation.  Patient states that he has a RW and cane in the home.  Patient has declined PT services while inpatient, and is declining home health services at discharge. Patient requesting BSC prior to discharge. Jason with [facility] notifed, and BSC will be delivered prior to discharge.  RNCM signing off.

## 2017-09-08 NOTE — Progress Notes (Signed)
Inpatient Diabetes Program Recommendations  AACE/ADA: New Consensus Statement on Inpatient Glycemic Control (2015)  Target Ranges:  Prepandial:   less than 140 mg/dL      Peak postprandial:   less than 180 mg/dL (1-2 hours)      Critically ill patients:  140 - 180 mg/dL    Review of Glycemic Control  Diabetes history: DM2 Outpatient Diabetes medications: 70/30 18 units QAM, 70/30 12 units QPM Current orders for Inpatient glycemic control: Levemir 6 units daily, Novolog 0-9 units ACHS  Inpatient Diabetes Program Recommendations:  Insulin - Basal: Patient received one time Decadron 10 mg at 10:44 on 09/04/17 which has contributed to hyperglycemia. Note patient is NPO for procedure today. Please consider increasing Levemir to 8 units daily. Insulin - Meal Coverage: Once diet is resumed and if patient is eating at least 50% of meals, please consider ordering Novolog 3 units TID with meals for meal coverage.  NOTE: Per chart review, glucose readings have not been uploaded from glucometer but per RN's comments in MAR, glucose has ranged from 163-211 mg/dl and patient has received a total of Novolog 11 units for correction over the past 24 hours.  Thanks, Barnie Alderman, RN, MSN, CDE Diabetes Coordinator Inpatient Diabetes Program [phone] (Team Pager from 8am to 5pm)

## 2017-09-08 NOTE — Discharge Instructions (Addendum)
Acute Kidney Injury, Adult Acute kidney injury is a sudden worsening of kidney function. The kidneys are organs that have several jobs. They filter the blood to remove waste products and extra fluid. They also maintain a healthy balance of minerals and hormones in the body, which helps control blood pressure and keep bones strong. With this condition, your kidneys do not do their jobs as well as they should. This condition ranges from mild to severe. Over time it may develop into long-lasting (chronic) kidney disease. Early detection and treatment may prevent acute kidney injury from developing into a chronic condition. What are the causes? Common causes of this condition include:  A problem with blood flow to the kidneys. This may be caused by: ? Low blood pressure (hypotension) or shock. ? Blood loss. ? Heart and blood vessel (cardiovascular) disease. ? Severe burns. ? Liver disease.  Direct damage to the kidneys. This may be caused by: ? Certain medicines. ? A kidney infection. ? Poisoning. ? Being around or in contact with toxic substances. ? A surgical wound. ? A hard, direct hit to the kidney area.  A sudden blockage of urine flow. This may be caused by: ? Cancer. ? Kidney stones. ? An enlarged prostate in males.  What are the signs or symptoms? Symptoms of this condition may not be obvious until the condition becomes severe. Symptoms of this condition can include:  Tiredness (lethargy), or difficulty staying awake.  Nausea or vomiting.  Swelling (edema) of the face, legs, ankles, or feet.  Problems with urination, such as: ? Abdominal pain, or pain along the side of your stomach (flank). ? Decreased urine production. ? Decrease in the force of urine flow.  Muscle twitches and cramps, especially in the legs.  Confusion or trouble concentrating.  Loss of appetite.  Fever.  How is this diagnosed? This condition may be diagnosed with tests, including:  Blood  tests.  Urine tests.  Imaging tests.  A test in which a sample of tissue is removed from the kidneys to be examined under a microscope (kidney biopsy).  How is this treated? Treatment for this condition depends on the cause and how severe the condition is. In mild cases, treatment may not be needed. The kidneys may heal on their own. In more severe cases, treatment will involve:  Treating the cause of the kidney injury. This may involve changing any medicines you are taking or adjusting your dosage.  Fluids. You may need specialized IV fluids to balance your body's needs.  Having a catheter placed to drain urine and prevent blockages.  Preventing problems from occurring. This may mean avoiding certain medicines or procedures that can cause further injury to the kidneys.  In some cases treatment may also require:  A procedure to remove toxic wastes from the body (dialysis or continuous renal replacement therapy - CRRT).  Surgery. This may be done to repair a torn kidney, or to remove the blockage from the urinary system.  Follow these instructions at home: Medicines  Take over-the-counter and prescription medicines only as told by your health care provider.  Do not take any new medicines without your health care provider's approval. Many medicines can worsen your kidney damage.  Do not take any vitamin and mineral supplements without your health care provider's approval. Many nutritional supplements can worsen your kidney damage. Lifestyle  If your health care provider prescribed changes to your diet, follow them. You may need to decrease the amount of protein you eat.  Achieve and maintain a healthy weight. If you need help with this, ask your health care provider.  Start or continue an exercise plan. Try to exercise at least 30 minutes a day, 5 days a week.  Do not use any tobacco products, such as cigarettes, chewing tobacco, and e-cigarettes. If you need help quitting, ask  your health care provider. General instructions  Keep track of your blood pressure. Report changes in your blood pressure as told by your health care provider.  Stay up to date with immunizations. Ask your health care provider which immunizations you need.  Keep all follow-up visits as told by your health care provider. This is important. Where to find more information:  American Association of Kidney Patients: BombTimer.gl  National Kidney Foundation: www.kidney.[place]: https://mathis.com/  Life Options Rehabilitation Program: ? www.lifeoptions.org ? www.kidneyschool.org Contact a health care provider if:  Your symptoms get worse.  You develop new symptoms. Get help right away if:  You develop symptoms of worsening kidney disease, which include: ? Headaches. ? Abnormally dark or light skin. ? Easy bruising. ? Frequent hiccups. ? Chest pain. ? Shortness of breath. ? End of menstruation in women. ? Seizures. ? Confusion or altered mental status. ? Abdominal or back pain. ? Itchiness.  You have a fever.  Your body is producing less urine.  You have pain or bleeding when you urinate. Summary  Acute kidney injury is a sudden worsening of kidney function.  Acute kidney injury can be caused by problems with blood flow to the kidneys, direct damage to the kidneys, and sudden blockage of urine flow.  Symptoms of this condition may not be obvious until it becomes severe. Symptoms may include edema, lethargy, confusion, nausea or vomiting, and problems passing urine.  This condition can usually be diagnosed with blood tests, urine tests, and imaging tests. Sometimes a kidney biopsy is done to diagnose this condition.  Treatment for this condition often involves treating the underlying cause. It is treated with fluids, medicines, dialysis, diet changes, or surgery. This information is not intended to replace advice given to you by your health care provider.  Make sure you discuss any questions you have with your health care provider. Document Released: 02/25/2011 Document Revised: 12/12/2016 Document Reviewed: 08/02/2016 Elsevier Interactive Patient Education  2018 [place] [facility] [place]:  Diabetic diet  DISCHARGE CONDITION:  Stable  ACTIVITY:  Activity as tolerated  OXYGEN:  Home Oxygen: No.   Oxygen Delivery: room air  DISCHARGE LOCATION:  home    ADDITIONAL DISCHARGE INSTRUCTION:   If you experience worsening of your admission symptoms, develop shortness of breath, life threatening emergency, suicidal or homicidal thoughts you must seek medical attention immediately by calling 911 or calling your MD immediately  if symptoms less severe.  You Must read complete instructions/literature along with all the possible adverse reactions/side effects for all the Medicines you take and that have been prescribed to you. Take any new Medicines after you have completely understood and accpet all the possible adverse reactions/side effects.   Please note  You were cared for by a hospitalist during your hospital stay. If you have any questions about your discharge medications or the care you received while you were in the hospital after you are discharged, you can call the unit and asked to speak with the hospitalist on call if the hospitalist that took care of you is not available. Once you are discharged, your primary  care physician will handle any further medical issues. Please note that NO REFILLS for any discharge medications will be authorized once you are discharged, as it is imperative that you return to your primary care physician (or establish a relationship with a primary care physician if you do not have one) for your aftercare needs so that they can reassess your need for medications and monitor your lab values.

## 2017-09-08 NOTE — Care Management Important Message (Signed)
Important Message  Patient Details  Name: Adam Wall MRN: [id number] Date of Birth: Dec 26, 1945   Medicare Important Message Given:  Yes    Beverly Sessions, RN 09/08/2017, 12:31 PM

## 2017-09-08 NOTE — Progress Notes (Signed)
Increased pts O2 to 6L. Pts O2 sat at 88% currently. Reported to Dr. Posey Pronto and respiratory.

## 2017-09-08 NOTE — Progress Notes (Signed)
I spoke to Dr. Kathlene Cote over the phone today. They are unable to do kyphoplasty today. Earliest they can do it is on 09/11/17. If patient can be safely discharged at this point, I will arrange kyphoplasty as an outpatient. Rad Onc has been informed that he is inpatient as well if they want to consider RT.  Patient has significant back pain from L5 pathologic fracture and kyphoplasty is likely to provide significant relief. Unclear how much rehab would help him without kyphoplasty.  Dr. Randa Evens, MD, MPH [facility] [phone] 09/08/2017 11:01 AM

## 2017-09-08 NOTE — Progress Notes (Signed)
RN checked pts O2 sat and pt was at 80% on 4 L of O2, Increases O2 to 5L with pt increasing to 82%, Increased to 6 L pt stayed between 86-88%. Pt also drowsy and falling asleep despite repeated attempts to keep her awake. Called Dr. Posey Pronto to report pts O2. Respiratory also called to visualize pt. ABGs ordered on pt.

## 2017-09-08 NOTE — Progress Notes (Signed)
Pt discharged per MD order. IV removed. Prescription given to pt. Discharge instructions reviewed with pt and wife and all questions answered to satisfaction. Unit secretary unable to set up appt for follow-up with Dr. Juleen China. Pt given phone number and instructed to contact Dr. Assunta Gambles office if they do not call pt first. Pt taken to car in wheelchair by staff.

## 2017-09-08 NOTE — Progress Notes (Signed)
[facility], [place] 09/08/17  Subjective:   Overall doing fair Looking forward to going home Wife is at bedside Denies any acute SOB    Objective:  Vital signs in last 24 hours:  Temp:  [98.2 F (36.8 C)-98.7 F (37.1 C)] 98.4 F (36.9 C) (01/14 1128) Pulse Rate:  [70-78] 78 (01/14 1128) Resp:  [18-20] 18 (01/14 1128) BP: (108-181)/(43-83) 151/60 (01/14 1128) SpO2:  [98 %-100 %] 99 % (01/14 1128)  Weight change:  Filed Weights   09/04/17 1520  Weight: 95.3 kg (210 lb)    Intake/Output:    Intake/Output Summary (Last 24 hours) at 09/08/2017 1253 Last data filed at 09/08/2017 0746 Gross per 24 hour  Intake 580 ml  Output 1300 ml  Net -720 ml     Physical Exam: General: Laying in bed, NAD  HEENT anicteric  Neck supple  Pulm/lungs Normal effort, decreased breath sounds at bases, mild scattered rhonchi  CVS/Heart regular  Abdomen:  Soft, NT  Extremities: Trace to 1+ edema  Neurologic: Alert, able to answer questions  Skin: No acute rashes          Basic Metabolic Panel:  Recent Labs  Lab 09/04/17 1200 09/05/17 0421 09/06/17 0610 09/07/17 0658 09/08/17 0347  NA 133* 139 140 140 139  K 4.9 4.4 4.3 4.0 3.7  CL 105 111 113* 112* 112*  CO2 _0 21*  GLUCOSE 467* 305* 261* 211* 163*  BUN 110* 97* 72* 59* 46*  CREATININE 3.07* 2.95* 2.63* 2.41* 2.19*  CALCIUM 7.6* 7.8* 7.6* 7.4* 6.9*  PHOS  --   --   --   --  2.1*     CBC: Recent Labs  Lab 09/04/17 1200 09/04/17 1325 09/05/17 0421 09/06/17 0610 09/07/17 1257 09/08/17 0347  WBC 8.8 8.6 7.8 8.4 8.1 5.0  NEUTROABS 8.2*  --   --   --  6.4  --   HGB 8.8* 8.9* 8.2* 8.9* 8.7* 7.9*  HCT 27.3* 28.5* 26.0* 27.9* 27.7* 25.2*  MCV 70.8* 71.1* 70.6* 70.5* 71.3* 70.5*  PLT 46* 47* 41* 36* 26* 40*      Lab Results  Component Value Date   HEPBSAG Negative 09/05/2017   HEPBSAB Non Reactive 09/05/2017   HEPBIGM Negative 09/05/2017      Microbiology:  Recent  Results (from the past 240 hour(s))  Urine Culture     Status: Abnormal   Collection Time: 09/04/17  6:45 PM  Result Value Ref Range Status   Specimen Description   Final    URINE, RANDOM Performed at [facility], [address], [place], [place] [address]    Special Requests   Final    NONE Performed at [facility], [address] [place], [place] [address]    Culture MULTIPLE SPECIES PRESENT, SUGGEST RECOLLECTION (A)  Final   Report Status 09/06/2017 FINAL  Final  C difficile quick scan w PCR reflex     Status: None   Collection Time: 09/07/17 12:11 PM  Result Value Ref Range Status   C Diff antigen NEGATIVE NEGATIVE Final   C Diff toxin NEGATIVE NEGATIVE Final   C Diff interpretation No C. difficile detected.  Final    Comment: Performed at [facility], [address] [place], [place] [address]    Coagulation Studies: No results for input(s): LABPROT, INR in the last 72 hours.  Urinalysis: No results for input(s): COLORURINE, LABSPEC, [place], [place], [place], [place], [place], [place], [place], NITRITE, LEUKOCYTESUR  in the last 72 hours.  Invalid input(s): APPERANCEUR    Imaging: US Renal  Result Date: 09/06/2017 CLINICAL DATA:  Acute kidney failure EXAM: RENAL / URINARY TRACT ULTRASOUND COMPLETE COMPARISON:  CT 10/27/2012 FINDINGS: Right Kidney: Length: Surgically absent. Left Kidney: Length: 11.9 cm. Echogenicity within normal limits. No mass or hydronephrosis visualized. Bladder: Foley catheter in place, decompressed. IMPRESSION: Prior right nephrectomy. Left kidney unremarkable. Electronically Signed   By: [name] M.D.   On: 09/06/2017 15:48     Medications:    . acyclovir  400 mg Oral Daily  . aspirin EC  81 mg Oral Daily  . atorvastatin  40 mg Oral QHS  . carvedilol  25 mg Oral BID WC  . fentaNYL  12.5 mcg Transdermal Q72H  . hydrALAZINE  25 mg Oral TID  . insulin aspart  0-9 Units Subcutaneous TID AC & HS   . insulin detemir  6 Units Subcutaneous Daily  . loperamide  2 mg Oral Q6H  . mouth rinse  15 mL Mouth Rinse BID  . multivitamin  15 mL Oral Daily  . pantoprazole (PROTONIX) IV  40 mg Intravenous Q12H  . protein supplement shake  11 oz Oral BID BM  . tamsulosin  0.8 mg Oral Daily  . vitamin B-12  1,000 mcg Oral Daily  . vitamin C  500 mg Oral Daily   acetaminophen **OR** acetaminophen, ondansetron (ZOFRAN) IV, oxyCODONE  Assessment/ Plan:  72 y.o. african [place] male with multiple myeloma, right nephrectomy secondary to renal cell carcinoma, diastolic congestive heart failure, anemia/thalessemia trait, AICD placement, diabetes mellitus type II, coronary artery disease status post CABG, who was admitted to [facility]  1. 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  Previous good creatinine was in July 2018. Noted to be 1.30 at that time Presenting Cr 3.07 on Jan 10; Improved to 2.19 today Urine has m spike noted on 12/12 Total urine protein of 2.4 gms Continue treatment for myeloma as planned Patient to f/u as outpatient          LOS: [place] 1/14/201912:53 PM  [place], [place]

## 2017-09-08 NOTE — Progress Notes (Signed)
Pt returned from scan still on 6L. Pt O2 sat was 95%. Decreased pt to 4 L, pt O2 sat at 92%.

## 2017-09-08 NOTE — Discharge Summary (Signed)
Adam Wall - [name] [facility] [place], 72 y.o., DOB December 19, 1945, MRN [id number]. Admission date: 09/04/2017 Discharge Date 09/08/2017 Primary MD [name], MD Admitting Physician [name], MD  Admission Diagnosis  Orthopnea [R06.01] Urinary retention [R33.9] Weakness [R53.1] Dyspnea, unspecified type [R06.00] Bilateral back pain, unspecified back location, unspecified chronicity [M54.9]  Discharge Diagnosis   Active Problems: Back pain due to L5 vertebral fracture Dysphagia Acute renal failure on chronic kidney disease stage IV Multiple myeloma Thrombocytopenia Diabetes type 2 BPH with urinary retention    Hospital Course 72 y/o male with history of IgG multiple myeloma, chronic kidney disease stage III status post right nephrectomy and diabetes who was sent to the ER by interventional radiology due to low platelets and complaint of difficulty swallowing.  Patient was seen in consultation by ENT who evaluated him and there was no obstruction noted in his upper airway.  Patient also was planned to have vertebroplasty however his platelet counts were too low.  His primary oncologist spoke to interventional radiologist they stated that this could be done on Thursday.  Patient also was noted to have acute on chronic renal failure was given IV hydration renal function is now close to baseline.  He was seen by nephrology.  Patient will need close follow-up with his primary oncologist.  Patient also had diarrhea which was treated with Imodium now is much better.             Consults  hematology/oncology  Significant Tests:  See full reports for all details    Dg Chest 2 View  Result Date: 09/04/2017 CLINICAL DATA:  Increased shortness of breath especially upon exertion, known compression fractures, history of CHF, hypertension, neuropathy, pacemaker, myeloma EXAM: CHEST  2 VIEW COMPARISON:  Chest x-ray dated 09/02/2014. FINDINGS: Stable  cardiomegaly. Median sternotomy wires appear intact and stable in alignment, related to presumed CABG. Aortic atherosclerosis. Pacemaker/ICD apparatus in place. Lungs appear clear. No pleural effusion or pneumothorax seen. Mild degenerative spurring within the thoracic spine. No acute or suspicious osseous finding. IMPRESSION: No active cardiopulmonary disease. No evidence of pneumonia or pulmonary edema. Stable cardiomegaly. Aortic atherosclerosis. Electronically Signed   By: [name] M.D.   On: 09/04/2017 17:06   Ct Lumbar Spine Wo Contrast  Result Date: 08/28/2017 CLINICAL DATA:  Followup abnormal PET scan at L5. History of multiple myeloma. EXAM: CT LUMBAR SPINE WITHOUT CONTRAST TECHNIQUE: Multidetector CT imaging of the lumbar spine was performed without intravenous contrast administration. Multiplanar CT image reconstructions were also generated. COMPARISON:  PET scan 08/25/2017 and 10/28/2016. FINDINGS: Segmentation: 5 lumbar type vertebral bodies. Alignment: Normal Vertebrae: Old superior endplate Schmorl's nodes/fractures at L1 and L3. Acute or subacute fracture at the superior endplate of L5 with loss of height centrally of about 25%. No retropulsed bone. The bone underlying the fracture appears somewhat lucent, suggesting that there could be an underlying marrow space lesion. No extraosseous extension of tumor, if present. Lytic areas noted in both iliac bones consistent with the history of myeloma. Paraspinal and other soft tissues: No significant paraspinous soft tissue finding. Disc levels: Solid bridging osteophytes from the thoracic region through L4. Mild chronic disc bulges and facet arthritis without likely significant stenosis. IMPRESSION: Acute or subacute superior endplate fracture at L5 with loss of height centrally of 25%. Slight lucency at the bone underlying the fracture raises the possibility of an underlying marrow space lesion. Lucent areas in both iliac bones suggesting the  presence of myeloma in those regions.  Electronically Signed   By: [name] M.D.   On: 08/28/2017 11:19   US Renal  Result Date: 09/06/2017 CLINICAL DATA:  Acute kidney failure EXAM: RENAL / URINARY TRACT ULTRASOUND COMPLETE COMPARISON:  CT 10/27/2012 FINDINGS: Right Kidney: Length: Surgically absent. Left Kidney: Length: 11.9 cm. Echogenicity within normal limits. No mass or hydronephrosis visualized. Bladder: Foley catheter in place, decompressed. IMPRESSION: Prior right nephrectomy. Left kidney unremarkable. Electronically Signed   By: [name] M.D.   On: 09/06/2017 15:48   Nm Pet Image Restage (ps) Whole Body  Result Date: 08/25/2017 CLINICAL DATA:  The the treatment strategy for tumor type. EXAM: NUCLEAR MEDICINE PET WHOLE BODY TECHNIQUE: mCi F-18 FDG was injected intravenously. Full-ring PET imaging was performed from the vertex to the feet after the radiotracer. CT data was obtained and used for attenuation correction and anatomic localization. FASTING BLOOD GLUCOSE:  Value:  mg/dl COMPARISON:  None. FINDINGS: HEAD/NECK No hypermetabolic activity in the scalp. No hypermetabolic cervical lymph nodes. CHEST Persistent hypermetabolic foci identified in the mediastinum and hilar regions. This is similar but slightly decreased compared to prior study. Index prevascular lymph node demonstrates SUV max = 5.4. The heart is enlarged. Left-sided pacer/AICD again noted. Mild FDG uptake at the area around the battery pack is probably related to granulation tissue. Distal esophageal uptake noted previously is less apparent today. ABDOMEN/PELVIS No abnormal hypermetabolic activity within the liver, pancreas, adrenal glands, or spleen. No hypermetabolic lymph nodes in the abdomen or pelvis. There is abdominal aortic atherosclerosis without aneurysm. Stable 16 mm low-density splenic lesion without hypermetabolic FDG accumulation. SKELETON New uptake is seen diffusely in the L5 vertebral body which has become  much more heterogeneous in the interval with areas of lucency. There may be a fracture line in the vertebral body visible on image 227. Activity seen previously and T8 and T10 is not readily evident today. The bony mineralization remains diffusely heterogeneous EXTREMITIES Uptake is identified in the soft tissues posterior to the right calcaneus. This is associated with soft tissue edema in the lower legs bilaterally. IMPRESSION: 1. Interval development of hypermetabolism in the L5 vertebral body. Axial CT imaging raises the question of fracture, possibly pathologic. 2. Activity seen previously at T8 and T10 is not evident on today's exam. 3. Similar but less conspicuous hypermetabolic lymph nodes in the mediastinum and hilar regions. 4. Focal hypermetabolism in the soft tissues posterior to the right calcaneus with diffuse soft tissue edema seen in the lower extremities bilaterally. Electronically Signed   By: Misty Stanley M.D.   On: 08/25/2017 13:50   Ct Bone Marrow Biopsy & Aspiration  Result Date: 08/20/2017 CLINICAL DATA:  Multiple myeloma. Need for bone marrow biopsy to establish status of plasma cells in the bone marrow. EXAM: CT GUIDED BONE MARROW ASPIRATION AND BIOPSY ANESTHESIA/SEDATION: Versed 3.0 mg IV, Fentanyl 75 mcg IV Total Moderate Sedation Time:  17 minutes. The patient's level of consciousness and physiologic status were continuously monitored during the procedure by Radiology nursing. PROCEDURE: The procedure risks, benefits, and alternatives were explained to the patient. Questions regarding the procedure were encouraged and answered. The patient understands and consents to the procedure. A time out was performed prior to initiating the procedure. The right gluteal region was prepped with chlorhexidine. Sterile gown and sterile gloves were used for the procedure. Local anesthesia was provided with 1% Lidocaine. Under CT guidance, an 11 gauge On Control bone cutting needle was advanced from  a posterior approach into the right iliac bone.  Needle positioning was confirmed with CT. Initial non heparinized and heparinized aspirate samples were obtained of bone marrow. Core biopsy was performed via the On Control drill needle. COMPLICATIONS: None FINDINGS: Inspection of initial aspirate did reveal visible particles. Intact core biopsy sample was obtained. IMPRESSION: CT guided bone marrow biopsy of right posterior iliac bone with both aspirate and core samples obtained. Electronically Signed   By: Aletta Edouard M.D.   On: 08/20/2017 09:58       Today   Subjective:   Wisdom Rickey feeling better complains of some weakness  Objective:   Blood pressure (!) 151/60, pulse 78, temperature 98.4 F (36.9 C), resp. rate 18, height 6' (1.829 m), weight 210 lb (95.3 kg), SpO2 99 %.  .  Intake/Output Summary (Last 24 hours) at 09/08/2017 1445 Last data filed at 09/08/2017 1407 Gross per 24 hour  Intake 1060 ml  Output 1300 ml  Net -240 ml    Exam VITAL SIGNS: Blood pressure (!) 151/60, pulse 78, temperature 98.4 F (36.9 C), resp. rate 18, height 6' (1.829 m), weight 210 lb (95.3 kg), SpO2 99 %.  GENERAL:  72 y.o.-year-old patient lying in the bed with no acute distress.  EYES: Pupils equal, round, reactive to light and accommodation. No scleral icterus. Extraocular muscles intact.  HEENT: Head atraumatic, normocephalic. Oropharynx and nasopharynx clear.  NECK:  Supple, no jugular venous distention. No thyroid enlargement, no tenderness.  LUNGS: Normal breath sounds bilaterally, no wheezing, rales,rhonchi or crepitation. No use of accessory muscles of respiration.  CARDIOVASCULAR: S1, S2 normal. No murmurs, rubs, or gallops.  ABDOMEN: Soft, nontender, nondistended. Bowel sounds present. No organomegaly or mass.  EXTREMITIES: No pedal edema, cyanosis, or clubbing.  NEUROLOGIC: Cranial nerves II through XII are intact. Muscle strength 5/5 in all extremities. Sensation intact. Gait not  checked.  PSYCHIATRIC: The patient is alert and oriented x 3.  SKIN: No obvious rash, lesion, or ulcer.   Data Review     CBC w Diff:  Lab Results  Component Value Date   WBC 5.0 09/08/2017   HGB 7.9 (L) 09/08/2017   HCT 25.2 (L) 09/08/2017   PLT 40 (L) 09/08/2017   LYMPHOPCT 5 09/07/2017   MONOPCT 16 09/07/2017   EOSPCT 1 09/07/2017   BASOPCT 0 09/07/2017   CMP:  Lab Results  Component Value Date   NA 139 09/08/2017   K 3.7 09/08/2017   CL 112 (H) 09/08/2017   CO2 21 (L) 09/08/2017   BUN 46 (H) 09/08/2017   CREATININE 2.19 (H) 09/08/2017   PROT 6.5 09/04/2017   ALBUMIN 2.2 (L) 09/08/2017   BILITOT 0.9 09/04/2017   ALKPHOS 81 09/04/2017   AST 18 09/04/2017   ALT 54 09/04/2017  .  Micro Results Recent Results (from the past 240 hour(s))  Urine Culture     Status: Abnormal   Collection Time: 09/04/17  6:45 PM  Result Value Ref Range Status   Specimen Description   Final    URINE, RANDOM Performed at [facility], [address], [place], [place] [address]    Special Requests   Final    NONE Performed at [facility], [address] [place], [place] [address]    Culture MULTIPLE SPECIES PRESENT, SUGGEST RECOLLECTION (A)  Final   Report Status 09/06/2017 FINAL  Final  C difficile quick scan w PCR reflex     Status: None   Collection Time: 09/07/17 12:11 PM  Result Value Ref Range Status   C  Diff antigen NEGATIVE NEGATIVE Final   C Diff toxin NEGATIVE NEGATIVE Final   C Diff interpretation No C. difficile detected.  Final    Comment: Performed at [facility], [address] [place], [place] [address]        Code Status Orders  (From admission, onward)        Start     Ordered   09/04/17 1755  Full code  Continuous     09/04/17 1755    Code Status History    Date Active Date Inactive Code Status Order ID Comments User Context   This patient has a current code status but no historical code status.    Advance  Directive Documentation     Most Recent Value  Type of Advance Directive  Healthcare Power of Attorney  Pre-existing out of facility DNR order (yellow form or pink MOST form)  No data  "MOST" Form in Place?  No data          Follow-up Information    Sindy Guadeloupe, MD. Go on 09/15/2017.   Specialty:  Oncology Why:  Monday at 10:15am for hospital follow-up. [place] location. Contact information: [place] [address] [phone]           Discharge Medications   Allergies as of 09/08/2017   No Known Allergies     Medication List    STOP taking these medications   aspirin 81 MG tablet     TAKE these medications   acetaminophen 500 MG tablet Commonly known as:  TYLENOL Take 1,000 mg by mouth every 8 (eight) hours as needed for mild pain or moderate pain.   acyclovir 400 MG tablet Commonly known as:  ZOVIRAX Take 1 tablet (400 mg total) by mouth daily.   atorvastatin 40 MG tablet Commonly known as:  LIPITOR Take 40 mg by mouth at bedtime.   carvedilol 25 MG tablet Commonly known as:  COREG Take 25 mg by mouth 2 (two) times daily with a meal.   fentaNYL 12 MCG/HR Commonly known as:  DURAGESIC - dosed mcg/hr Place 1 patch (12.5 mcg total) onto the skin every 3 (three) days.   furosemide 40 MG tablet Commonly known as:  LASIX Take 20 mg by mouth daily.   gabapentin 100 MG capsule Commonly known as:  NEURONTIN Take 100 mg by mouth daily as needed.   hydrALAZINE 25 MG tablet Commonly known as:  APRESOLINE Take 25 mg by mouth 3 (three) times daily.   insulin NPH-regular Human (70-30) 100 UNIT/ML injection Commonly known as:  NOVOLIN 70/30 Inject 12-18 Units into the skin 2 (two) times daily. 18u (AM) and 12u (PM)   lenalidomide 15 MG capsule Commonly known as:  REVLIMID Take 1 capsule (15 mg total) by mouth every other day. For 2 weeks on and 1 week off   loperamide 2 MG capsule Commonly known as:  IMODIUM Take 1 capsule (2 mg  total) by mouth as needed for diarrhea or loose stools.   oxyCODONE 5 MG immediate release tablet Commonly known as:  Oxy IR/ROXICODONE Take 1 tablet (5 mg total) by mouth every 6 (six) hours as needed for severe pain.   tamsulosin 0.4 MG Caps capsule Commonly known as:  FLOMAX Take 0.4 mg by mouth daily.   vitamin B-12 1000 MCG tablet Commonly known as:  CYANOCOBALAMIN Take 1,000 mcg by mouth daily.   vitamin C 500 MG tablet Commonly known as:  ASCORBIC ACID Take 500 mg  by mouth daily.            Durable Medical Equipment  (From admission, onward)        Start     Ordered   09/08/17 1219  For home use only DME Bedside commode  Once    Question Answer Comment  Patient needs a bedside commode to treat with the following condition Weakness   Patient needs a bedside commode to treat with the following condition Pain      09/08/17 1219         Total Time in preparing paper work, data evaluation and todays exam - 35 minutes  Dustin Flock M.D on 09/08/2017 at 2:45 PM  [place]  [phone]

## 2017-09-09 ENCOUNTER — Ambulatory Visit: Payer: Medicare Other

## 2017-09-09 ENCOUNTER — Other Ambulatory Visit: Payer: Self-pay | Admitting: *Deleted

## 2017-09-09 ENCOUNTER — Telehealth: Payer: Self-pay | Admitting: *Deleted

## 2017-09-09 DIAGNOSIS — S32059A Unspecified fracture of fifth lumbar vertebra, initial encounter for closed fracture: Secondary | ICD-10-CM

## 2017-09-09 DIAGNOSIS — C9000 Multiple myeloma not having achieved remission: Secondary | ICD-10-CM

## 2017-09-09 LAB — MULTIPLE MYELOMA PANEL, SERUM
ALBUMIN SERPL ELPH-MCNC: 2.8 g/dL — AB (ref 2.9–4.4)
Albumin/Glob SerPl: 1 (ref 0.7–1.7)
Alpha 1: 0.2 g/dL (ref 0.0–0.4)
Alpha2 Glob SerPl Elph-Mcnc: 0.6 g/dL (ref 0.4–1.0)
B-GLOBULIN SERPL ELPH-MCNC: 0.6 g/dL — AB (ref 0.7–1.3)
GAMMA GLOB SERPL ELPH-MCNC: 1.6 g/dL (ref 0.4–1.8)
GLOBULIN, TOTAL: 3 g/dL (ref 2.2–3.9)
IGA: 13 mg/dL — AB (ref 61–437)
IgG (Immunoglobin G), Serum: 1876 mg/dL — ABNORMAL HIGH (ref 700–1600)
IgM (Immunoglobulin M), Srm: 7 mg/dL — ABNORMAL LOW (ref 15–143)
M PROTEIN SERPL ELPH-MCNC: 1.5 g/dL — AB
Total Protein ELP: 5.8 g/dL — ABNORMAL LOW (ref 6.0–8.5)

## 2017-09-09 NOTE — Telephone Encounter (Signed)
I spoke to Janese Banks and she has spoke to Dr. Anselm Pancoast and they may could do kyphoplasty this week Friday maybe in [place] and if not we could work out next week in [place].  All of this pending plt being high enough to get the kyphoplasty.  I spoke about all of this with pt and wife. He would like the soonest appt and it does not matter what location.  I also will call and cancel dr. Osie Cheeks appt at [facility]. Also Dr. Janese Banks wants pt to come in this Thursday to the [place] location and get labs and see md. I have made appt 10:30 for labs and 11 am to see md. Fraser Din agreeable to the plan and I have cancelled the appt for [facility]. I have put in order for kyphoplasty in [place] and [place]. Waiting to hear back from that. Patient agreeable to the plan and as I get more info and more appt I will let pt know

## 2017-09-09 NOTE — Telephone Encounter (Signed)
-----   Message from Secundino Ginger sent at 09/09/2017  2:01 PM EST ----- Contact: [phone] Wife called and left msg on VM that she wants to speak to nurse or DR about her husband.

## 2017-09-09 NOTE — Telephone Encounter (Signed)
Adam Wall called and asked if we are cancelling the appointment Wed with Dr Seward Speck or if she is supposed to do it. Please advise

## 2017-09-10 ENCOUNTER — Other Ambulatory Visit: Payer: Self-pay | Admitting: *Deleted

## 2017-09-10 ENCOUNTER — Emergency Department: Payer: Medicare Other

## 2017-09-10 ENCOUNTER — Encounter: Payer: Self-pay | Admitting: *Deleted

## 2017-09-10 ENCOUNTER — Inpatient Hospital Stay
Admission: EM | Admit: 2017-09-10 | Discharge: 2017-09-16 | DRG: 478 | Disposition: A | Discharge status: Skilled Nursing Facility | Payer: Medicare Other | Attending: Internal Medicine | Admitting: Internal Medicine

## 2017-09-10 ENCOUNTER — Ambulatory Visit: Payer: Medicare Other

## 2017-09-10 ENCOUNTER — Other Ambulatory Visit: Payer: Self-pay

## 2017-09-10 DIAGNOSIS — E861 Hypovolemia: Secondary | ICD-10-CM | POA: Diagnosis present

## 2017-09-10 DIAGNOSIS — I130 Hypertensive heart and chronic kidney disease with heart failure and stage 1 through stage 4 chronic kidney disease, or unspecified chronic kidney disease: Secondary | ICD-10-CM | POA: Diagnosis present

## 2017-09-10 DIAGNOSIS — E875 Hyperkalemia: Secondary | ICD-10-CM | POA: Diagnosis present

## 2017-09-10 DIAGNOSIS — Z87891 Personal history of nicotine dependence: Secondary | ICD-10-CM

## 2017-09-10 DIAGNOSIS — M436 Torticollis: Secondary | ICD-10-CM | POA: Diagnosis present

## 2017-09-10 DIAGNOSIS — I9589 Other hypotension: Secondary | ICD-10-CM

## 2017-09-10 DIAGNOSIS — M549 Dorsalgia, unspecified: Secondary | ICD-10-CM | POA: Diagnosis present

## 2017-09-10 DIAGNOSIS — I959 Hypotension, unspecified: Secondary | ICD-10-CM | POA: Diagnosis present

## 2017-09-10 DIAGNOSIS — M4856XA Collapsed vertebra, not elsewhere classified, lumbar region, initial encounter for fracture: Secondary | ICD-10-CM | POA: Diagnosis not present

## 2017-09-10 DIAGNOSIS — Z794 Long term (current) use of insulin: Secondary | ICD-10-CM

## 2017-09-10 DIAGNOSIS — N189 Chronic kidney disease, unspecified: Secondary | ICD-10-CM

## 2017-09-10 DIAGNOSIS — B952 Enterococcus as the cause of diseases classified elsewhere: Secondary | ICD-10-CM | POA: Diagnosis present

## 2017-09-10 DIAGNOSIS — D563 Thalassemia minor: Secondary | ICD-10-CM | POA: Diagnosis present

## 2017-09-10 DIAGNOSIS — Z79891 Long term (current) use of opiate analgesic: Secondary | ICD-10-CM

## 2017-09-10 DIAGNOSIS — L89622 Pressure ulcer of left heel, stage 2: Secondary | ICD-10-CM | POA: Diagnosis present

## 2017-09-10 DIAGNOSIS — M545 Low back pain, unspecified: Secondary | ICD-10-CM

## 2017-09-10 DIAGNOSIS — I5022 Chronic systolic (congestive) heart failure: Secondary | ICD-10-CM | POA: Diagnosis present

## 2017-09-10 DIAGNOSIS — R402413 Glasgow coma scale score 13-15, at hospital admission: Secondary | ICD-10-CM | POA: Diagnosis present

## 2017-09-10 DIAGNOSIS — L8990 Pressure ulcer of unspecified site, unspecified stage: Secondary | ICD-10-CM

## 2017-09-10 DIAGNOSIS — E785 Hyperlipidemia, unspecified: Secondary | ICD-10-CM | POA: Diagnosis present

## 2017-09-10 DIAGNOSIS — E1122 Type 2 diabetes mellitus with diabetic chronic kidney disease: Secondary | ICD-10-CM | POA: Diagnosis present

## 2017-09-10 DIAGNOSIS — Z951 Presence of aortocoronary bypass graft: Secondary | ICD-10-CM

## 2017-09-10 DIAGNOSIS — S32059A Unspecified fracture of fifth lumbar vertebra, initial encounter for closed fracture: Secondary | ICD-10-CM

## 2017-09-10 DIAGNOSIS — L89612 Pressure ulcer of right heel, stage 2: Secondary | ICD-10-CM | POA: Diagnosis present

## 2017-09-10 DIAGNOSIS — N289 Disorder of kidney and ureter, unspecified: Secondary | ICD-10-CM

## 2017-09-10 DIAGNOSIS — Z79899 Other long term (current) drug therapy: Secondary | ICD-10-CM

## 2017-09-10 DIAGNOSIS — N179 Acute kidney failure, unspecified: Secondary | ICD-10-CM | POA: Diagnosis present

## 2017-09-10 DIAGNOSIS — G629 Polyneuropathy, unspecified: Secondary | ICD-10-CM | POA: Diagnosis present

## 2017-09-10 DIAGNOSIS — Z95810 Presence of automatic (implantable) cardiac defibrillator: Secondary | ICD-10-CM

## 2017-09-10 DIAGNOSIS — Z905 Acquired absence of kidney: Secondary | ICD-10-CM

## 2017-09-10 DIAGNOSIS — N3000 Acute cystitis without hematuria: Secondary | ICD-10-CM | POA: Diagnosis present

## 2017-09-10 DIAGNOSIS — N183 Chronic kidney disease, stage 3 (moderate): Secondary | ICD-10-CM | POA: Diagnosis present

## 2017-09-10 DIAGNOSIS — E1140 Type 2 diabetes mellitus with diabetic neuropathy, unspecified: Secondary | ICD-10-CM | POA: Diagnosis present

## 2017-09-10 DIAGNOSIS — G8929 Other chronic pain: Secondary | ICD-10-CM | POA: Diagnosis present

## 2017-09-10 DIAGNOSIS — D630 Anemia in neoplastic disease: Secondary | ICD-10-CM | POA: Diagnosis present

## 2017-09-10 DIAGNOSIS — C9000 Multiple myeloma not having achieved remission: Secondary | ICD-10-CM

## 2017-09-10 DIAGNOSIS — N400 Enlarged prostate without lower urinary tract symptoms: Secondary | ICD-10-CM | POA: Diagnosis present

## 2017-09-10 DIAGNOSIS — D6959 Other secondary thrombocytopenia: Secondary | ICD-10-CM | POA: Diagnosis present

## 2017-09-10 LAB — COMPREHENSIVE METABOLIC PANEL
ALBUMIN: 2.8 g/dL — AB (ref 3.5–5.0)
ALK PHOS: 70 U/L (ref 38–126)
ALT: 39 U/L (ref 17–63)
AST: 17 U/L (ref 15–41)
Anion gap: 9 (ref 5–15)
BUN: 55 mg/dL — AB (ref 6–20)
CALCIUM: 6.9 mg/dL — AB (ref 8.9–10.3)
CO2: 19 mmol/L — ABNORMAL LOW (ref 22–32)
CREATININE: 2.75 mg/dL — AB (ref 0.61–1.24)
Chloride: 103 mmol/L (ref 101–111)
GFR calc Af Amer: 25 mL/min — ABNORMAL LOW (ref >60)
GFR calc non Af Amer: 21 mL/min — ABNORMAL LOW (ref >60)
GLUCOSE: 249 mg/dL — AB (ref 65–99)
POTASSIUM: 4.8 mmol/L (ref 3.5–5.1)
Sodium: 131 mmol/L — ABNORMAL LOW (ref 135–145)
TOTAL PROTEIN: 6.1 g/dL — AB (ref 6.5–8.1)
Total Bilirubin: 0.9 mg/dL (ref 0.3–1.2)

## 2017-09-10 LAB — CBC WITH DIFFERENTIAL/PLATELET
BASOS PCT: 1 %
Basophils Absolute: 0 10*3/uL (ref 0–0.1)
Eosinophils Absolute: 0.1 10*3/uL (ref 0–0.7)
Eosinophils Relative: 1 %
HCT: 23.9 % — ABNORMAL LOW (ref 40.0–52.0)
Hemoglobin: 7.6 g/dL — ABNORMAL LOW (ref 13.0–18.0)
Lymphocytes Relative: 11 %
Lymphs Abs: 0.7 10*3/uL — ABNORMAL LOW (ref 1.0–3.6)
MCH: 22.3 pg — ABNORMAL LOW (ref 26.0–34.0)
MCHC: 31.6 g/dL — AB (ref 32.0–36.0)
MCV: 70.6 fL — ABNORMAL LOW (ref 80.0–100.0)
MONO ABS: 1 10*3/uL (ref 0.2–1.0)
MONOS PCT: 16 %
NEUTROS ABS: 4.5 10*3/uL (ref 1.4–6.5)
Neutrophils Relative %: 71 %
Platelets: 111 10*3/uL — ABNORMAL LOW (ref 150–440)
RBC: 3.39 MIL/uL — ABNORMAL LOW (ref 4.40–5.90)
RDW: 18.2 % — AB (ref 11.5–14.5)
WBC: 6.4 10*3/uL (ref 3.8–10.6)

## 2017-09-10 LAB — TROPONIN I: Troponin I: 0.05 ng/mL (ref <0.03)

## 2017-09-10 LAB — LACTIC ACID, PLASMA: Lactic Acid, Venous: 1.1 mmol/L (ref 0.5–1.9)

## 2017-09-10 LAB — BRAIN NATRIURETIC PEPTIDE: B Natriuretic Peptide: 137 pg/mL — ABNORMAL HIGH (ref 0.0–100.0)

## 2017-09-10 MED ORDER — SODIUM CHLORIDE 0.9 % IV BOLUS (SEPSIS)
1000.0000 mL | Freq: Once | INTRAVENOUS | Status: AC
  Administered 2017-09-10: 1000 mL via INTRAVENOUS

## 2017-09-10 MED ORDER — FENTANYL CITRATE (PF) 100 MCG/2ML IJ SOLN
50.0000 ug | INTRAMUSCULAR | Status: DC | PRN
  Administered 2017-09-10 – 2017-09-11 (×3): 50 ug via INTRAVENOUS
  Filled 2017-09-10 (×2): qty 2

## 2017-09-10 NOTE — ED Triage Notes (Signed)
Pt to triage via wheelchair. Pt has multiple myeloma.  Pt has low back pain for 4 days.  Pt was discharged from [facility] this week.  Pt alert.  Family with pt.

## 2017-09-10 NOTE — ED Notes (Signed)
Date and time results received: 09/10/17 2241  Test: troponin I Critical Value: 0.05  Name of Provider Notified: Dr. Quentin Cornwall  Orders Received? Or Actions Taken?: no new orders at this time

## 2017-09-10 NOTE — ED Provider Notes (Addendum)
[facility] Emergency Department Provider Note    First MD Initiated Contact with Patient 09/10/17 2112     (approximate)  I have reviewed the triage vital signs and the nursing notes.   HISTORY  Chief Complaint Back Pain    HPI Adam Wall is a 72 y.o. male history of multiple myeloma being evaluated for kyphoplasty due to chronic compression fractures with recent admission to the hospital on discharge the last of this week presents to the ER for worsening pain generalized weakness fatigue.  Patient denies any cough or shortness of breath.  No chest pain.  Is complaining of persistent pain in his low back.  Denies any diarrhea or blood in his stools.  No dysuria.  Family has noted decreased oral intake.  Patient is unable to ambulate but a few steps due to severe pain.  The pain is excruciating and nonradiating.  Past Medical History:  Diagnosis Date  . AICD (automatic cardioverter/defibrillator) present   . Anemia   . Blockage of coronary artery of heart ([place])   . CHF (congestive heart failure) ([place])   . Chronic kidney disease 05/22/2017   nephrectomy-right  . Diabetes mellitus without complication ([place])   . Dysrhythmia   . Heart murmur   . Hypertension   . Hypochloremia   . LBBB (left bundle branch block)   . Neuropathy   . Presence of permanent cardiac pacemaker   . Right renal mass   . 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 Heart failure Paternal Grandmother    Past Surgical History:  Procedure Laterality Date  . CARDIAC SURGERY    . CORONARY ARTERY BYPASS GRAFT    . ENDOBRONCHIAL ULTRASOUND N/A 04/03/2017   Procedure: ENDOBRONCHIAL ULTRASOUND;  Surgeon: Flora Lipps, MD;  Location: [facility] ORS;  Service: Cardiopulmonary;  Laterality: N/A;  . Heart Bypass    . PACEMAKER PLACEMENT     ICD   Patient Active Problem List   Diagnosis Date Noted  . Dysphagia 09/04/2017  . Goals of  care, counseling/discussion 08/18/2017  . Multiple myeloma not having achieved remission ([place]) 08/18/2017  . Multiple myeloma ([place]) 08/18/2017  . Adenopathy   . SOBOE (shortness of breath on exertion) 03/13/2017  . Renal mass 03/03/2017  . 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 Iron deficiency anemia 02/22/2017  . Atherosclerosis of native artery of both lower extremities with intermittent claudication ([place]) 02/21/2017  . Atherosclerosis of native arteries of extremity with intermittent claudication ([place]) 07/08/2016  . Hyperlipidemia 07/08/2016  . Coronary atherosclerosis 07/08/2016  . Diabetes ([place]) 07/08/2016  . Bilateral leg edema 05/09/2015  . Biventricular ICD (implantable cardioverter-defibrillator) in place 05/09/2015  . Biventricular implantable cardioverter-defibrillator in situ 04/12/2015  . Acute combined systolic and diastolic congestive heart failure ([place]) 04/11/2015  . Essential hypertension 03/30/2015  . Left carotid bruit 03/30/2015  . Microcytic anemia 03/30/2015  . Weak pulse 03/30/2015  . Mitral valve insufficiency 03/22/2015  . Moderate mitral insufficiency 03/22/2015  . Benign essential hypertension 03/02/2015  . Chronic systolic CHF (congestive heart failure), NYHA class 2 ([place]) 10/25/2014  . Chronic systolic heart failure ([place]) 10/25/2014  . Acute kidney injury (nontraumatic) ([place]) 10/18/2014  . Complicated UTI (urinary tract infection) 10/18/2014  . Pyelonephritis 10/18/2014  . Congestive cardiomyopathy ([place]) 05/11/2014  . Colon polyp 01/01/2013  . Benign prostatic hyperplasia without lower urinary tract symptoms 06/12/2012  . Elevated prostate specific antigen (PSA) 06/12/2012  .  Chronic anemia 03/18/2012  . Referral of patient 03/18/2012  . Sepsis due to urinary tract infection ([place]) 03/18/2012  . Sepsis secondary to UTI ([place]) 03/18/2012  . Elevated PSA 03/17/2012  . Hypertension 03/17/2012  . LBBB (left bundle branch block) 03/17/2012  . Left bundle  branch block (LBBB) 03/17/2012  . Microalbuminuria 03/17/2012  . Retinopathy 03/17/2012  . Atherosclerosis 05/28/2011      Prior to Admission medications   Medication Sig Start Date End Date Taking? Authorizing Provider  acetaminophen (TYLENOL) 500 MG tablet Take 1,000 mg by mouth every 8 (eight) hours as needed for mild pain or moderate pain.    [provider]  acyclovir (ZOVIRAX) 400 MG tablet Take 1 tablet (400 mg total) by mouth daily. 08/18/17   Sindy Guadeloupe, MD  atorvastatin (LIPITOR) 40 MG tablet Take 40 mg by mouth at bedtime. 02/27/17   [provider]  carvedilol (COREG) 25 MG tablet Take 25 mg by mouth 2 (two) times daily with a meal.    [provider]  fentaNYL (DURAGESIC - DOSED MCG/HR) 12 MCG/HR Place 1 patch (12.5 mcg total) onto the skin every 3 (three) days. 09/08/17   Dustin Flock, MD  furosemide (LASIX) 40 MG tablet Take 20 mg by mouth daily.    [provider]  gabapentin (NEURONTIN) 100 MG capsule Take 100 mg by mouth daily as needed.     [provider]  hydrALAZINE (APRESOLINE) 25 MG tablet Take 25 mg by mouth 3 (three) times daily.    [provider]  insulin NPH-regular Human (NOVOLIN 70/30) (70-30) 100 UNIT/ML injection Inject 12-18 Units into the skin 2 (two) times daily. 18u (AM) and 12u (PM)    [provider]  lenalidomide (REVLIMID) 15 MG capsule Take 1 capsule (15 mg total) by mouth every other day. For 2 weeks on and 1 week off Patient not taking: Reported on 09/04/2017 08/20/17 09/19/17  Sindy Guadeloupe, MD  loperamide (IMODIUM) 2 MG capsule Take 1 capsule (2 mg total) by mouth as needed for diarrhea or loose stools. 09/08/17   Dustin Flock, MD  oxyCODONE (OXY IR/ROXICODONE) 5 MG immediate release tablet Take 1 tablet (5 mg total) by mouth every 6 (six) hours as needed for severe pain. 09/08/17   Dustin Flock, MD  tamsulosin (FLOMAX) 0.4 MG CAPS capsule Take 0.4 mg by mouth daily.    [provider]  vitamin B-12 (CYANOCOBALAMIN) 1000 MCG tablet Take 1,000 mcg by mouth daily.    [provider]  vitamin C (ASCORBIC ACID) 500 MG tablet Take 500 mg by mouth daily.    [provider]    Allergies Patient has no known allergies.    Social History Social History   Tobacco Use  . Smoking status: Former Smoker    Years: 10.00    Types: Cigars    Last attempt to quit: 10/04/1993    Years since quitting: 23.9  . Smokeless tobacco: Never Used  . Tobacco comment: 3 CIGARS PER DAY  Substance Use Topics  . Alcohol use: No  . Drug use: No    Review of Systems Patient denies headaches, rhinorrhea, blurry vision, numbness, shortness of breath, chest pain, edema, cough, abdominal pain, nausea, vomiting, diarrhea, dysuria, fevers, rashes or hallucinations unless otherwise stated above in HPI. ____________________________________________   PHYSICAL EXAM:  VITAL SIGNS: Vitals:   09/10/17 2130 09/10/17 2230  BP: (!) 126/57 (!) 122/50  Pulse: 69 69  Resp: 18 14  Temp:  SpO2: 98% 94%    Constitutional: Alert and oriented. Chronically ill appearing in obvious discomfort unable to bear any weight without two person assist Eyes: Conjunctivae are pale  Head: Atraumatic. Nose: No congestion/rhinnorhea. Mouth/Throat: Mucous membranes are dry Neck: No stridor. Painless ROM.  Cardiovascular: Normal rate, regular rhythm. S1 murmur Good peripheral circulation. Respiratory: Normal respiratory effort.  No retractions. Lungs CTAB. Gastrointestinal: Soft and nontender. No distention. No abdominal bruits. No CVA tenderness. Genitourinary:  Musculoskeletal: No lower extremity tenderness. 2+ BLE edema.  + TTP midline spine.  No joint effusions. Neurologic:  Normal speech and language. No gross focal neurologic deficits are appreciated. No facial droop Skin:  Skin is warm, dry and intact. No rash noted. Psychiatric: Mood and affect are normal. Speech and behavior  are normal.  ____________________________________________   LABS (all labs ordered are listed, but only abnormal results are displayed)  Results for orders placed or performed during the hospital encounter of 09/10/17 (from the past 24 hour(s))  Lactic acid, plasma     Status: None   Collection Time: 09/10/17  9:13 PM  Result Value Ref Range   Lactic Acid, Venous 1.1 0.5 - 1.9 mmol/L  Brain natriuretic peptide     Status: Abnormal   Collection Time: 09/10/17  9:13 PM  Result Value Ref Range   B Natriuretic Peptide 137.0 (H) 0.0 - 100.0 pg/mL  Comprehensive metabolic panel     Status: Abnormal   Collection Time: 09/10/17  9:37 PM  Result Value Ref Range   Sodium 131 (L) 135 - 145 mmol/L   Potassium 4.8 3.5 - 5.1 mmol/L   Chloride 103 101 - 111 mmol/L   CO2 19 (L) 22 - 32 mmol/L   Glucose, Bld 249 (H) 65 - 99 mg/dL   BUN 55 (H) 6 - 20 mg/dL   Creatinine, Ser 2.75 (H) 0.61 - 1.24 mg/dL   Calcium 6.9 (L) 8.9 - 10.3 mg/dL   Total Protein 6.1 (L) 6.5 - 8.1 g/dL   Albumin 2.8 (L) 3.5 - 5.0 g/dL   AST 17 15 - 41 U/L   ALT 39 17 - 63 U/L   Alkaline Phosphatase 70 38 - 126 U/L   Total Bilirubin 0.9 0.3 - 1.2 mg/dL   GFR calc non Af Amer 21 (L) >60 mL/min   GFR calc Af Amer 25 (L) >60 mL/min   Anion gap 9 5 - 15  Troponin I     Status: Abnormal   Collection Time: 09/10/17  9:37 PM  Result Value Ref Range   Troponin I 0.05 (HH) <0.03 ng/mL  CBC WITH DIFFERENTIAL     Status: Abnormal   Collection Time: 09/10/17  9:37 PM  Result Value Ref Range   WBC 6.4 3.8 - 10.6 K/uL   RBC 3.39 (L) 4.40 - 5.90 MIL/uL   Hemoglobin 7.6 (L) 13.0 - 18.0 g/dL   HCT 23.9 (L) 40.0 - 52.0 %   MCV 70.6 (L) 80.0 - 100.0 fL   MCH 22.3 (L) 26.0 - 34.0 pg   MCHC 31.6 (L) 32.0 - 36.0 g/dL   RDW 18.2 (H) 11.5 - 14.5 %   Platelets 111 (L) 150 - 440 K/uL   Neutrophils Relative % 71 %   Neutro Abs 4.5 1.4 - 6.5 K/uL   Lymphocytes Relative 11 %   Lymphs Abs 0.7 (L) 1.0 - 3.6 K/uL   Monocytes Relative 16 %     Monocytes Absolute 1.0 0.2 - 1.0 K/uL   Eosinophils Relative 1 %  Eosinophils Absolute 0.1 0 - 0.7 K/uL   Basophils Relative 1 %   Basophils Absolute 0.0 0 - 0.1 K/uL   ____________________________________________  EKG My review and personal interpretation at Time: 21:48   Indication: hypotension  Rate: 70  Rhythm: av paced Axis: left Other: normal intervals, paced rhythm ____________________________________________  RADIOLOGY  I personally reviewed all radiographic images ordered to evaluate for the above acute complaints and reviewed radiology reports and findings.  These findings were personally discussed with the patient.  Please see medical record for radiology report.  ____________________________________________   PROCEDURES  Procedure(s) performed:  Procedures    Critical Care performed: no ____________________________________________   INITIAL IMPRESSION / ASSESSMENT AND PLAN / ED COURSE  Pertinent labs & imaging results that were available during my care of the patient were reviewed by me and considered in my medical decision making (see chart for details).  DDX: dehydration, acs, mm, fracture, sepsis,   Adam Wall is a 73 y.o. who presents to the ED with severe pain back pain with hypotension.  Seems less consistent with AAA.  Pain is consistent with previous multiple myeloma pain.  Patient is dehydrated with decreased oral intake.  He is afebrile.  Not tachycardic.  Given IV fluids for dehydration and resuscitation.  Blood work does show progressive worsening AK I which is concerning particularly as patient only has 1 solitary kidney.  Also with mild metabolic acidosis.  No lactic acidosis.  No evidence of pneumonia.  Patient with improved symptoms after IV hydration as well as multiple doses of IV pain medication.  This point do feel the patient will require admission the hospital for observation given his episode of hypotension dehydration and severe  intractable pain.  Also concerning for AK I in the setting of solitary kidney.  Patient also with evidence of elevated troponin of 0.05 which is new but of uncertain significance given his chronic renal insufficiency and history of CHF.  Have discussed with the patient and available family all diagnostics and treatments performed thus far and all questions were answered to the best of my ability. The patient demonstrates understanding and agreement with plan.       ____________________________________________   FINAL CLINICAL IMPRESSION(S) / ED DIAGNOSES  Final diagnoses:  Chronic midline low back pain without sciatica  Acute on chronic renal insufficiency  Hypotension due to hypovolemia      NEW MEDICATIONS STARTED DURING THIS VISIT:  New Prescriptions   No medications on file     Note:  This document was prepared using Dragon voice recognition software and may include unintentional dictation errors.    Merlyn Lot, MD 09/10/17 3570    Merlyn Lot, MD 09/10/17 2322

## 2017-09-11 ENCOUNTER — Inpatient Hospital Stay: Payer: Medicare Other | Admitting: Oncology

## 2017-09-11 ENCOUNTER — Ambulatory Visit: Payer: Medicare Other

## 2017-09-11 ENCOUNTER — Telehealth: Payer: Self-pay | Admitting: *Deleted

## 2017-09-11 ENCOUNTER — Other Ambulatory Visit: Payer: Self-pay

## 2017-09-11 ENCOUNTER — Inpatient Hospital Stay: Payer: Medicare Other

## 2017-09-11 DIAGNOSIS — C9000 Multiple myeloma not having achieved remission: Secondary | ICD-10-CM | POA: Diagnosis not present

## 2017-09-11 DIAGNOSIS — M545 Low back pain: Secondary | ICD-10-CM

## 2017-09-11 DIAGNOSIS — M4856XA Collapsed vertebra, not elsewhere classified, lumbar region, initial encounter for fracture: Secondary | ICD-10-CM | POA: Diagnosis present

## 2017-09-11 DIAGNOSIS — I130 Hypertensive heart and chronic kidney disease with heart failure and stage 1 through stage 4 chronic kidney disease, or unspecified chronic kidney disease: Secondary | ICD-10-CM | POA: Diagnosis present

## 2017-09-11 DIAGNOSIS — R402413 Glasgow coma scale score 13-15, at hospital admission: Secondary | ICD-10-CM | POA: Diagnosis present

## 2017-09-11 DIAGNOSIS — N289 Disorder of kidney and ureter, unspecified: Secondary | ICD-10-CM

## 2017-09-11 DIAGNOSIS — N183 Chronic kidney disease, stage 3 (moderate): Secondary | ICD-10-CM | POA: Diagnosis present

## 2017-09-11 DIAGNOSIS — N400 Enlarged prostate without lower urinary tract symptoms: Secondary | ICD-10-CM | POA: Diagnosis present

## 2017-09-11 DIAGNOSIS — D508 Other iron deficiency anemias: Secondary | ICD-10-CM | POA: Diagnosis not present

## 2017-09-11 DIAGNOSIS — D563 Thalassemia minor: Secondary | ICD-10-CM

## 2017-09-11 DIAGNOSIS — E785 Hyperlipidemia, unspecified: Secondary | ICD-10-CM | POA: Diagnosis present

## 2017-09-11 DIAGNOSIS — E861 Hypovolemia: Secondary | ICD-10-CM

## 2017-09-11 DIAGNOSIS — G8929 Other chronic pain: Secondary | ICD-10-CM | POA: Diagnosis present

## 2017-09-11 DIAGNOSIS — I959 Hypotension, unspecified: Secondary | ICD-10-CM | POA: Diagnosis present

## 2017-09-11 DIAGNOSIS — D6959 Other secondary thrombocytopenia: Secondary | ICD-10-CM

## 2017-09-11 DIAGNOSIS — I9589 Other hypotension: Secondary | ICD-10-CM | POA: Diagnosis not present

## 2017-09-11 DIAGNOSIS — E1140 Type 2 diabetes mellitus with diabetic neuropathy, unspecified: Secondary | ICD-10-CM | POA: Diagnosis present

## 2017-09-11 DIAGNOSIS — G629 Polyneuropathy, unspecified: Secondary | ICD-10-CM | POA: Diagnosis present

## 2017-09-11 DIAGNOSIS — N3000 Acute cystitis without hematuria: Secondary | ICD-10-CM | POA: Diagnosis present

## 2017-09-11 DIAGNOSIS — N189 Chronic kidney disease, unspecified: Secondary | ICD-10-CM | POA: Diagnosis not present

## 2017-09-11 DIAGNOSIS — B952 Enterococcus as the cause of diseases classified elsewhere: Secondary | ICD-10-CM | POA: Diagnosis present

## 2017-09-11 DIAGNOSIS — E1122 Type 2 diabetes mellitus with diabetic chronic kidney disease: Secondary | ICD-10-CM | POA: Diagnosis present

## 2017-09-11 DIAGNOSIS — N179 Acute kidney failure, unspecified: Secondary | ICD-10-CM | POA: Diagnosis present

## 2017-09-11 DIAGNOSIS — D630 Anemia in neoplastic disease: Secondary | ICD-10-CM | POA: Diagnosis present

## 2017-09-11 DIAGNOSIS — L89622 Pressure ulcer of left heel, stage 2: Secondary | ICD-10-CM | POA: Diagnosis present

## 2017-09-11 DIAGNOSIS — M549 Dorsalgia, unspecified: Secondary | ICD-10-CM | POA: Diagnosis present

## 2017-09-11 DIAGNOSIS — L89612 Pressure ulcer of right heel, stage 2: Secondary | ICD-10-CM | POA: Diagnosis present

## 2017-09-11 DIAGNOSIS — I5022 Chronic systolic (congestive) heart failure: Secondary | ICD-10-CM | POA: Diagnosis present

## 2017-09-11 DIAGNOSIS — M436 Torticollis: Secondary | ICD-10-CM | POA: Diagnosis present

## 2017-09-11 DIAGNOSIS — E875 Hyperkalemia: Secondary | ICD-10-CM | POA: Diagnosis present

## 2017-09-11 LAB — URINALYSIS, COMPLETE (UACMP) WITH MICROSCOPIC
BILIRUBIN URINE: NEGATIVE
Bacteria, UA: NONE SEEN
Glucose, UA: NEGATIVE mg/dL
Hgb urine dipstick: NEGATIVE
Ketones, ur: NEGATIVE mg/dL
Nitrite: NEGATIVE
Protein, ur: 30 mg/dL — AB
RBC / HPF: NONE SEEN RBC/hpf (ref 0–5)
SPECIFIC GRAVITY, URINE: 1.01 (ref 1.005–1.030)
SQUAMOUS EPITHELIAL / LPF: NONE SEEN
pH: 5 (ref 5.0–8.0)

## 2017-09-11 LAB — CBC
HCT: 21.5 % — ABNORMAL LOW (ref 40.0–52.0)
HEMATOCRIT: 22.7 % — AB (ref 40.0–52.0)
Hemoglobin: 6.8 g/dL — ABNORMAL LOW (ref 13.0–18.0)
Hemoglobin: 7.3 g/dL — ABNORMAL LOW (ref 13.0–18.0)
MCH: 22.4 pg — AB (ref 26.0–34.0)
MCH: 22.9 pg — AB (ref 26.0–34.0)
MCHC: 31.8 g/dL — ABNORMAL LOW (ref 32.0–36.0)
MCHC: 32.2 g/dL (ref 32.0–36.0)
MCV: 70.5 fL — AB (ref 80.0–100.0)
MCV: 71.2 fL — AB (ref 80.0–100.0)
PLATELETS: 107 10*3/uL — AB (ref 150–440)
PLATELETS: 124 10*3/uL — AB (ref 150–440)
RBC: 3.05 MIL/uL — AB (ref 4.40–5.90)
RBC: 3.19 MIL/uL — ABNORMAL LOW (ref 4.40–5.90)
RDW: 18.2 % — ABNORMAL HIGH (ref 11.5–14.5)
RDW: 18.9 % — AB (ref 11.5–14.5)
WBC: 4.4 10*3/uL (ref 3.8–10.6)
WBC: 4.5 10*3/uL (ref 3.8–10.6)

## 2017-09-11 LAB — BASIC METABOLIC PANEL
Anion gap: 5 (ref 5–15)
BUN: 52 mg/dL — AB (ref 6–20)
CHLORIDE: 106 mmol/L (ref 101–111)
CO2: 21 mmol/L — ABNORMAL LOW (ref 22–32)
CREATININE: 2.66 mg/dL — AB (ref 0.61–1.24)
Calcium: 6.4 mg/dL — CL (ref 8.9–10.3)
GFR calc non Af Amer: 22 mL/min — ABNORMAL LOW (ref >60)
GFR, EST AFRICAN AMERICAN: 26 mL/min — AB (ref >60)
Glucose, Bld: 215 mg/dL — ABNORMAL HIGH (ref 65–99)
Potassium: 4.3 mmol/L (ref 3.5–5.1)
Sodium: 132 mmol/L — ABNORMAL LOW (ref 135–145)

## 2017-09-11 LAB — MRSA PCR SCREENING: MRSA by PCR: NEGATIVE

## 2017-09-11 LAB — GLUCOSE, CAPILLARY
Glucose-Capillary: 156 mg/dL — ABNORMAL HIGH (ref 65–99)
Glucose-Capillary: 128 mg/dL — ABNORMAL HIGH (ref 65–99)
Glucose-Capillary: 113 mg/dL — ABNORMAL HIGH (ref 65–99)

## 2017-09-11 LAB — LACTIC ACID, PLASMA: Lactic Acid, Venous: 0.5 mmol/L (ref 0.5–1.9)

## 2017-09-11 LAB — PREPARE RBC (CROSSMATCH)

## 2017-09-11 MED ORDER — ONDANSETRON HCL 4 MG PO TABS: 4.0000 mg | ORAL_TABLET | Freq: Four times a day (QID) | ORAL | Status: DC | PRN

## 2017-09-11 MED ORDER — HYDRALAZINE HCL 50 MG PO TABS
25.0000 mg | ORAL_TABLET | Freq: Three times a day (TID) | ORAL | Status: DC
  Administered 2017-09-11: 25 mg via ORAL
  Filled 2017-09-11: qty 1
  Filled 2017-09-11: qty 0.5

## 2017-09-11 MED ORDER — DOCUSATE SODIUM 100 MG PO CAPS
100.0000 mg | ORAL_CAPSULE | Freq: Two times a day (BID) | ORAL | Status: DC
  Administered 2017-09-11 – 2017-09-16 (×8): 100 mg via ORAL
  Filled 2017-09-11 (×10): qty 1

## 2017-09-11 MED ORDER — INSULIN ASPART PROT & ASPART (70-30 MIX) 100 UNIT/ML ~~LOC~~ SUSP
12.0000 [IU] | Freq: Two times a day (BID) | SUBCUTANEOUS | Status: DC
  Administered 2017-09-11 (×2): 12 [IU] via SUBCUTANEOUS
  Filled 2017-09-11 (×2): qty 1

## 2017-09-11 MED ORDER — GABAPENTIN 100 MG PO CAPS
100.0000 mg | ORAL_CAPSULE | Freq: Every day | ORAL | Status: DC | PRN
  Administered 2017-09-11 – 2017-09-16 (×5): 100 mg via ORAL
  Filled 2017-09-11 (×5): qty 1

## 2017-09-11 MED ORDER — ONDANSETRON HCL 4 MG/2ML IJ SOLN: 4.0000 mg | Freq: Four times a day (QID) | INTRAMUSCULAR | Status: DC | PRN

## 2017-09-11 MED ORDER — ACYCLOVIR 200 MG PO CAPS
400.0000 mg | ORAL_CAPSULE | Freq: Every day | ORAL | Status: DC
  Administered 2017-09-11 – 2017-09-16 (×5): 400 mg via ORAL
  Filled 2017-09-11 (×6): qty 2

## 2017-09-11 MED ORDER — ACETAMINOPHEN 650 MG RE SUPP: 650.0000 mg | Freq: Four times a day (QID) | RECTAL | Status: DC | PRN

## 2017-09-11 MED ORDER — SODIUM CHLORIDE 0.9 % IV SOLN
INTRAVENOUS | Status: DC
  Administered 2017-09-11 (×2): via INTRAVENOUS

## 2017-09-11 MED ORDER — OXYCODONE HCL 5 MG PO TABS
5.0000 mg | ORAL_TABLET | Freq: Four times a day (QID) | ORAL | Status: DC | PRN
  Administered 2017-09-11 – 2017-09-16 (×13): 5 mg via ORAL
  Filled 2017-09-11 (×13): qty 1

## 2017-09-11 MED ORDER — FUROSEMIDE 10 MG/ML IJ SOLN
40.0000 mg | Freq: Once | INTRAMUSCULAR | Status: AC
  Administered 2017-09-11: 40 mg via INTRAVENOUS
  Filled 2017-09-11: qty 4

## 2017-09-11 MED ORDER — INSULIN ASPART 100 UNIT/ML ~~LOC~~ SOLN
0.0000 [IU] | Freq: Three times a day (TID) | SUBCUTANEOUS | Status: DC
  Administered 2017-09-11: 13:00:00 2 [IU] via SUBCUTANEOUS
  Administered 2017-09-11: 1 [IU] via SUBCUTANEOUS
  Administered 2017-09-13: 3 [IU] via SUBCUTANEOUS
  Administered 2017-09-13: 2 [IU] via SUBCUTANEOUS
  Administered 2017-09-14: 17:00:00 3 [IU] via SUBCUTANEOUS
  Administered 2017-09-14: 09:00:00 2 [IU] via SUBCUTANEOUS
  Administered 2017-09-14: 12:00:00 3 [IU] via SUBCUTANEOUS
  Administered 2017-09-15 (×2): 1 [IU] via SUBCUTANEOUS
  Administered 2017-09-16: 2 [IU] via SUBCUTANEOUS
  Filled 2017-09-11 (×10): qty 1

## 2017-09-11 MED ORDER — HYDROCODONE-ACETAMINOPHEN 5-325 MG PO TABS
1.0000 | ORAL_TABLET | ORAL | Status: DC | PRN
  Administered 2017-09-11 (×2): 2 via ORAL
  Administered 2017-09-11: 1 via ORAL
  Administered 2017-09-12 – 2017-09-13 (×2): 2 via ORAL
  Administered 2017-09-13 (×3): 1 via ORAL
  Administered 2017-09-14: 2 via ORAL
  Administered 2017-09-15 – 2017-09-16 (×3): 1 via ORAL
  Administered 2017-09-16: 2 via ORAL
  Filled 2017-09-11 (×3): qty 1
  Filled 2017-09-11 (×2): qty 2
  Filled 2017-09-11: qty 1
  Filled 2017-09-11 (×2): qty 2
  Filled 2017-09-11 (×3): qty 1
  Filled 2017-09-11 (×2): qty 2

## 2017-09-11 MED ORDER — ACYCLOVIR 400 MG PO TABS
400.0000 mg | ORAL_TABLET | Freq: Every day | ORAL | Status: DC
  Filled 2017-09-11: qty 1

## 2017-09-11 MED ORDER — CARVEDILOL 25 MG PO TABS
25.0000 mg | ORAL_TABLET | Freq: Two times a day (BID) | ORAL | Status: DC
  Administered 2017-09-11 – 2017-09-16 (×10): 25 mg via ORAL
  Filled 2017-09-11 (×10): qty 1

## 2017-09-11 MED ORDER — ATORVASTATIN CALCIUM 20 MG PO TABS
40.0000 mg | ORAL_TABLET | Freq: Every day | ORAL | Status: DC
  Administered 2017-09-11 – 2017-09-15 (×5): 40 mg via ORAL
  Filled 2017-09-11 (×6): qty 2

## 2017-09-11 MED ORDER — DEXTROSE 5 % IV SOLN
1.0000 g | INTRAVENOUS | Status: DC
  Administered 2017-09-11: 21:00:00 1 g via INTRAVENOUS
  Filled 2017-09-11 (×2): qty 10

## 2017-09-11 MED ORDER — HEPARIN SODIUM (PORCINE) 5000 UNIT/ML IJ SOLN
5000.0000 [IU] | Freq: Two times a day (BID) | INTRAMUSCULAR | Status: DC
  Administered 2017-09-11: 5000 [IU] via SUBCUTANEOUS
  Filled 2017-09-11: qty 1

## 2017-09-11 MED ORDER — TRAZODONE HCL 50 MG PO TABS
25.0000 mg | ORAL_TABLET | Freq: Every evening | ORAL | Status: DC | PRN
  Administered 2017-09-11 – 2017-09-13 (×2): 25 mg via ORAL
  Filled 2017-09-11 (×3): qty 1

## 2017-09-11 MED ORDER — INSULIN ASPART 100 UNIT/ML ~~LOC~~ SOLN
0.0000 [IU] | Freq: Every day | SUBCUTANEOUS | Status: DC
  Administered 2017-09-12 – 2017-09-13 (×2): 2 [IU] via SUBCUTANEOUS
  Filled 2017-09-11 (×2): qty 1

## 2017-09-11 MED ORDER — CALCIUM CARBONATE-VITAMIN D 500-200 MG-UNIT PO TABS
1.0000 | ORAL_TABLET | Freq: Three times a day (TID) | ORAL | Status: DC
  Administered 2017-09-11 – 2017-09-16 (×12): 1 via ORAL
  Filled 2017-09-11 (×13): qty 1

## 2017-09-11 MED ORDER — VITAMIN C 500 MG PO TABS
500.0000 mg | ORAL_TABLET | Freq: Every day | ORAL | Status: DC
  Administered 2017-09-11 – 2017-09-16 (×5): 500 mg via ORAL
  Filled 2017-09-11 (×6): qty 1

## 2017-09-11 MED ORDER — FENTANYL 12 MCG/HR TD PT72
12.5000 ug | MEDICATED_PATCH | TRANSDERMAL | Status: DC
  Administered 2017-09-11 – 2017-09-14 (×2): 12.5 ug via TRANSDERMAL
  Filled 2017-09-11 (×2): qty 1

## 2017-09-11 MED ORDER — VITAMIN B-12 1000 MCG PO TABS
1000.0000 ug | ORAL_TABLET | Freq: Every day | ORAL | Status: DC
  Administered 2017-09-11 – 2017-09-16 (×6): 1000 ug via ORAL
  Filled 2017-09-11 (×6): qty 1

## 2017-09-11 MED ORDER — SODIUM CHLORIDE 0.9 % IV SOLN
Freq: Once | INTRAVENOUS | Status: AC
  Administered 2017-09-11: 12:00:00 via INTRAVENOUS

## 2017-09-11 MED ORDER — TAMSULOSIN HCL 0.4 MG PO CAPS
0.4000 mg | ORAL_CAPSULE | Freq: Every day | ORAL | Status: DC
Start: 2017-09-11 — End: 2017-09-16
  Administered 2017-09-11 – 2017-09-16 (×5): 0.4 mg via ORAL
  Filled 2017-09-11 (×6): qty 1

## 2017-09-11 MED ORDER — BISACODYL 5 MG PO TBEC
5.0000 mg | DELAYED_RELEASE_TABLET | Freq: Every day | ORAL | Status: DC | PRN
  Administered 2017-09-15: 5 mg via ORAL
  Filled 2017-09-11: qty 1

## 2017-09-11 MED ORDER — ACETAMINOPHEN 325 MG PO TABS: 650.0000 mg | ORAL_TABLET | Freq: Four times a day (QID) | ORAL | Status: DC | PRN

## 2017-09-11 NOTE — Telephone Encounter (Signed)
Led to report that patient is in hospital and will not make his appointment today

## 2017-09-11 NOTE — ED Notes (Signed)
Patient transported to 112

## 2017-09-11 NOTE — Plan of Care (Signed)
  Education: Knowledge of General Education information will improve 09/11/2017 0555 - Progressing by Jeffie Pollock, RN   Activity: Risk for activity intolerance will decrease 09/11/2017 0555 - Progressing by Jeffie Pollock, RN   Pain Managment: General experience of comfort will improve 09/11/2017 0555 - Progressing by Jeffie Pollock, RN   Safety: Ability to remain free from injury will improve 09/11/2017 0555 - Progressing by Jeffie Pollock, RN   Skin Integrity: Risk for impaired skin integrity will decrease 09/11/2017 0555 - Progressing by Jeffie Pollock, RN   Bowel/Gastric: Will not experience complications related to bowel motility 09/11/2017 0555 - Progressing by Jeffie Pollock, RN   Nutritional: Maintenance of adequate nutrition will improve 09/11/2017 0555 - Progressing by Jeffie Pollock, RN

## 2017-09-11 NOTE — Progress Notes (Signed)
Patients BP 113/32. MD made aware. Verbal order given to hold this afternoons dose of hydralazine. Madlyn Frankel, RN

## 2017-09-11 NOTE — Progress Notes (Signed)
Inpatient Diabetes Program Recommendations  AACE/ADA: New Consensus Statement on Inpatient Glycemic Control (2015)  Target Ranges:  Prepandial:   less than 140 mg/dL      Peak postprandial:   less than 180 mg/dL (1-2 hours)      Critically ill patients:  140 - 180 mg/dL   Results for DONNIS, PHANEUF (MRN [id number]) as of 09/11/2017 10:46  Ref. Range 09/10/2017 21:37 09/11/2017 04:19  Glucose Latest Ref Range: 65 - 99 mg/dL 249 (H) 215 (H)    Review of Glycemic Control  Diabetes history: DM2 Outpatient Diabetes medications: 70/30 18 units QAM, 70/30 12 units QPM Current orders for Inpatient glycemic control: 70/30 12 units BID  Inpatient Diabetes Program Recommendations: Correction (SSI): Please consider ordering CBGs with Novolog correction scale 0-9 units TID with meals and 0-5 units QHS.   Thanks, Barnie Alderman, RN, MSN, CDE Diabetes Coordinator Inpatient Diabetes Program [phone] (Team Pager from 8am to 5pm)

## 2017-09-11 NOTE — H&P (Signed)
[facility] NAME: Adam Wall    MR#:  [id number]  DATE OF BIRTH:  05/25/1946  DATE OF ADMISSION:  09/10/2017  PRIMARY CARE PHYSICIAN: [name], MD   REQUESTING/REFERRING PHYSICIAN:   CHIEF COMPLAINT:   Chief Complaint  Patient presents with  . Back Pain    HISTORY OF PRESENT ILLNESS: Adam Wall  is a 72 y.o. male with a known history of multiple myeloma, CHF, diabetes type 2, hypertension, anemia, thrombocytopenia, chronic kidney failure, AICD placement.  Patient came to emergency room for intractable lower back pain, secondary to L5 vertebral fracture.  The pain is 10 out of 10 ache without any radiation, worse with standing. He is not able to ambulate due to severe pain. Patient was scheduled for kyphoplasty just 2 days ago but was unable to undergo the procedure due to low platelet count.  CBC shows again anemia and thrombocytopenia, although the platelet count is improved today, at 111.  Creatinine level is slightly worse 2.75.  Patient is admitted for pain control and acute on chronic renal failure.   PAST MEDICAL HISTORY:   Past Medical History:  Diagnosis Date  . AICD (automatic cardioverter/defibrillator) present   . Anemia   . Blockage of coronary artery of heart ([place])   . CHF (congestive heart failure) ([place])   . Chronic kidney disease 05/22/2017   nephrectomy-right  . Diabetes mellitus without complication ([place])   . Dysrhythmia   . Heart murmur   . Hypertension   . Hypochloremia   . LBBB (left bundle branch block)   . Neuropathy   . Presence of permanent cardiac pacemaker   . Right renal mass   . 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 CARDIAC SURGERY    . CORONARY ARTERY BYPASS GRAFT    . ENDOBRONCHIAL ULTRASOUND N/A 04/03/2017   Procedure: ENDOBRONCHIAL ULTRASOUND;  Surgeon: Flora Lipps, MD;  Location: [facility] ORS;  Service:  Cardiopulmonary;  Laterality: N/A;  . Heart Bypass    . PACEMAKER PLACEMENT     ICD    SOCIAL HISTORY:  Social History   Tobacco Use  . Smoking status: Former Smoker    Years: 10.00    Types: Cigars    Last attempt to quit: 10/04/1993    Years since quitting: 23.9  . Smokeless tobacco: Never Used  . Tobacco comment: 3 CIGARS PER DAY  Substance Use Topics  . Alcohol use: No    FAMILY HISTORY:  Family History  Adopted: Yes  Problem Relation Age of Onset  . Heart failure Paternal Grandmother     DRUG ALLERGIES: No Known Allergies  REVIEW OF SYSTEMS:   CONSTITUTIONAL: No fever, but positive for fatigue and generalized weakness.  EYES: No blurred or double vision.  EARS, NOSE, AND THROAT: No tinnitus or ear pain.  RESPIRATORY: No cough, shortness of breath, wheezing or hemoptysis.  CARDIOVASCULAR: No chest pain, orthopnea, edema.  GASTROINTESTINAL: No nausea, vomiting, diarrhea or abdominal pain.  GENITOURINARY: No dysuria, hematuria.  ENDOCRINE: No polyuria, nocturia,  HEMATOLOGY: Positive for anemia and thrombocytopenia, secondary to multiple myeloma. SKIN: No rash or lesion. MUSCULOSKELETAL: Patient complains of intractable lower back pain.  Unable to ambulate due to pain, NEUROLOGIC: Generalized weakness, due to pain.  PSYCHIATRY: No anxiety or depression.   MEDICATIONS AT HOME:  Prior to Admission medications   Medication Sig Start Date End Date Taking? Authorizing  Provider  acetaminophen (TYLENOL) 500 MG tablet Take 1,000 mg by mouth every 8 (eight) hours as needed for mild pain or moderate pain.   Yes [provider]  acyclovir (ZOVIRAX) 400 MG tablet Take 1 tablet (400 mg total) by mouth daily. 08/18/17  Yes Sindy Guadeloupe, MD  atorvastatin (LIPITOR) 40 MG tablet Take 40 mg by mouth at bedtime. 02/27/17  Yes [provider]  carvedilol (COREG) 25 MG tablet Take 25 mg by mouth 2 (two) times daily with a meal.   Yes [provider]   furosemide (LASIX) 40 MG tablet Take 20-40 mg by mouth daily. Pt takes 1 pill one day and alternates with 0.5 pill the next day ( this is alternating dose every other day)   Yes [provider]  gabapentin (NEURONTIN) 100 MG capsule Take 100 mg by mouth daily as needed (pain).    Yes [provider]  hydrALAZINE (APRESOLINE) 25 MG tablet Take 25 mg by mouth 3 (three) times daily.   Yes [provider]  insulin NPH-regular Human (NOVOLIN 70/30) (70-30) 100 UNIT/ML injection Inject 12-18 Units into the skin 2 (two) times daily. 18u (AM) and 12u (PM)   Yes [provider]  loperamide (IMODIUM) 2 MG capsule Take 1 capsule (2 mg total) by mouth as needed for diarrhea or loose stools. 09/08/17  Yes Dustin Flock, MD  oxyCODONE (OXY IR/ROXICODONE) 5 MG immediate release tablet Take 1 tablet (5 mg total) by mouth every 6 (six) hours as needed for severe pain. 09/08/17  Yes Dustin Flock, MD  tamsulosin (FLOMAX) 0.4 MG CAPS capsule Take 0.4 mg by mouth daily.   Yes [provider]  vitamin B-12 (CYANOCOBALAMIN) 1000 MCG tablet Take 1,000 mcg by mouth daily.   Yes [provider]  vitamin C (ASCORBIC ACID) 500 MG tablet Take 500 mg by mouth daily.   Yes [provider]  fentaNYL (DURAGESIC - DOSED MCG/HR) 12 MCG/HR Place 1 patch (12.5 mcg total) onto the skin every 3 (three) days. 09/08/17   Dustin Flock, MD      PHYSICAL EXAMINATION:   VITAL SIGNS: Blood pressure (!) 131/44, pulse 64, temperature 98 F (36.7 C), resp. rate 15, height 6' (1.829 m), weight 94.8 kg (208 lb 14.4 oz), SpO2 100 %.  GENERAL:  72 y.o.-year-old patient lying in the bed, in moderate distress, secondary to lower back pain.  EYES: Pupils equal, round, reactive to light and accommodation. No scleral icterus. Extraocular muscles intact.  HEENT: Head atraumatic, normocephalic. Oropharynx and nasopharynx clear.  NECK:  Supple, no jugular venous distention. No thyroid  enlargement, no tenderness.  LUNGS: Normal breath sounds bilaterally, no wheezing, rales,rhonchi or crepitation. No use of accessory muscles of respiration.  CARDIOVASCULAR: S1, S2 normal. No S3/S4.  ABDOMEN: Soft, nontender, nondistended. Bowel sounds present.  EXTREMITIES: No pedal edema, cyanosis, or clubbing.  NEUROLOGIC: No focal weakness. Gait not checked, patient is unable to ambulate due to pain.  PSYCHIATRIC: The patient is alert and oriented x 3.  SKIN: No obvious rash, lesion, or ulcer.   LABORATORY PANEL:   CBC Recent Labs  Lab 09/04/17 1200  09/05/17 0421 09/06/17 0610 09/07/17 1257 09/08/17 0347 09/10/17 2137  WBC 8.8   < > 7.8 8.4 8.1 5.0 6.4  HGB 8.8*   < > 8.2* 8.9* 8.7* 7.9* 7.6*  HCT 27.3*   < > 26.0* 27.9* 27.7* 25.2* 23.9*  PLT 46*   < > 41* 36* 26* 40* 111*  MCV 70.8*   < >  70.6* 70.5* 71.3* 70.5* 70.6*  MCH 22.8*   < > 22.3* 22.5* 22.5* 22.0* 22.3*  MCHC 32.2   < > 31.6* 32.0 31.6* 31.2* 31.6*  RDW 18.4*   < > 18.7* 18.8* 18.7* 18.2* 18.2*  LYMPHSABS 0.3*  --   --   --  0.4*  --  0.7*  MONOABS 0.3  --   --   --  1.3*  --  1.0  EOSABS 0.0  --   --   --  0.1  --  0.1  BASOSABS 0.0  --   --   --  0.0  --  0.0   < > = values in this interval not displayed.   ------------------------------------------------------------------------------------------------------------------  Chemistries  Recent Labs  Lab 09/04/17 1200 09/05/17 0421 09/06/17 0610 09/07/17 0658 09/08/17 0347 09/10/17 2137  NA 133* 139 140 140 139 131*  K 4.9 4.4 4.3 4.0 3.7 4.8  CL 105 111 113* 112* 112* 103  CO2 22 22 23 22  21* 19*  GLUCOSE 467* 305* 261* 211* 163* 249*  BUN 110* 97* 72* 59* 46* 55*  CREATININE 3.07* 2.95* 2.63* 2.41* 2.19* 2.75*  CALCIUM 7.6* 7.8* 7.6* 7.4* 6.9* 6.9*  AST 18  --   --   --   --  17  ALT 54  --   --   --   --  39  ALKPHOS 81  --   --   --   --  70  BILITOT 0.9  --   --   --   --  0.9    ------------------------------------------------------------------------------------------------------------------ estimated creatinine clearance is 28.6 mL/min (A) (by C-G formula based on SCr of 2.75 mg/dL (H)). ------------------------------------------------------------------------------------------------------------------ No results for input(s): TSH, T4TOTAL, T3FREE, THYROIDAB in the last 72 hours.  Invalid input(s): FREET3   Coagulation profile Recent Labs  Lab 09/04/17 1325  INR 1.05   ------------------------------------------------------------------------------------------------------------------- No results for input(s): DDIMER in the last 72 hours. -------------------------------------------------------------------------------------------------------------------  Cardiac Enzymes Recent Labs  Lab 09/04/17 1512 09/04/17 2037 09/10/17 2137  TROPONINI <0.03 <0.03 0.05*   ------------------------------------------------------------------------------------------------------------------ Invalid input(s): POCBNP  ---------------------------------------------------------------------------------------------------------------  Urinalysis    Component Value Date/Time   COLORURINE YELLOW 09/04/2017 1845   APPEARANCEUR HAZY (A) 09/04/2017 1845   APPEARANCEUR Clear 05/06/2012 0855   LABSPEC <1.005 (L) 09/04/2017 1845   LABSPEC 1.009 05/06/2012 0855   PHURINE 5.5 09/04/2017 1845   GLUCOSEU 500 (A) 09/04/2017 1845   GLUCOSEU Negative 05/06/2012 0855   HGBUR NEGATIVE 09/04/2017 1845   BILIRUBINUR NEGATIVE 09/04/2017 1845   BILIRUBINUR Negative 05/06/2012 0855   KETONESUR NEGATIVE 09/04/2017 1845   PROTEINUR 30 (A) 09/04/2017 1845   NITRITE NEGATIVE 09/04/2017 1845   LEUKOCYTESUR TRACE (A) 09/04/2017 1845   LEUKOCYTESUR Negative 05/06/2012 0855     RADIOLOGY: Dg Chest Port 1 View  Result Date: 09/10/2017 CLINICAL DATA:  Multiple myeloma, back pain for 4 days EXAM:  PORTABLE CHEST 1 VIEW COMPARISON:  09/04/2017 FINDINGS: There is no focal parenchymal opacity. There is no pleural effusion or pneumothorax. There is stable cardiomegaly. There is a 3 lead cardiac pacemaker. There is evidence of prior CABG. The osseous structures are unremarkable. IMPRESSION: No active disease. Electronically Signed   By: Kathreen Devoid   On: 09/10/2017 21:43    EKG: Orders placed or performed during the hospital encounter of 09/10/17  . ED EKG 12-Lead  . ED EKG 12-Lead    IMPRESSION AND PLAN:   1.  Acute lower back pain status post recent L5 vertebral  fracture.  Plan is for kyphoplasty this week.  Continue pain control for now. 2.  Advanced multiple myeloma, overall poor long-term prognosis.  We will consult palliative care team for further support. 3.  Acute renal failure on chronic stage III renal failure, secondary to multiple myeloma.  We will continue conservative management. 4.  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  Hemoglobin level dropped again to 7.6 from 8.7, status post 1 unit packed RBC transfusion just 3 days ago.  Patient is clinically asymptomatic we will continue to monitor for now. 5.  Thrombocytopenia secondary to multiple myeloma.  Platelet count is better today at 111.  Continue management per hematology oncology team.  All the records are reviewed and case discussed with ED provider. Management plans discussed with the patient, family and they are in agreement.  CODE STATUS:    Code Status Orders  (From admission, onward)        Start     Ordered   09/11/17 0309  Full code  Continuous     09/11/17 0308    Code Status History    Date Active Date Inactive Code Status Order ID Comments User Context   09/04/2017 17:55 09/08/2017 18:54 Full Code [id number]  [name], MD ED       TOTAL TIME TAKING CARE OF THIS PATIENT: 40 minutes.    Amelia Jo M.D on 09/11/2017 at 4:05 AM  Between 7am to 6pm - Pager - [phone]  After 6pm go to  [email or web address] - password [email or web address] [facility]  Office  [phone]  CC: Primary care physician; [name], MD

## 2017-09-11 NOTE — Consult Note (Signed)
Hematology/Oncology Consult note [facility] [phone] [phone]  Patient Care Team: [name], MD as PCP - General (Pediatrics)   Name of the patient: Adam Wall  [id number]  [birth year]    Reason for consult: h/o multiple myeloma. L5 pathologica fracture. Anemia and thrombocytopenia   Requesting physician: Dr. Leslye Peer  Date of visit: 09/11/2017    History of presenting illness-patient is a 72 year old male with a history of IgG multiple myeloma and is status post 1 cycle of Revlimid Velcade and Decadron.  Last dose was on 09/04/2017.  He was recently admitted to the hospital last week for worsening back pain and fatigue after he was not deemed safe to undergo kyphoplasty as an outpatient.  He was seen by nephrology and received IV fluids with some improvement in his kidney functions.  He does have acute on chronic kidney disease and his creatinine is currently between 2-3.  Patient also has long-standing anemia.  Prior to the diagnosis of multiple myeloma his baseline hemoglobin was around 10 with microcytosis given that he also has thalassemia trait.  His hemoglobin was 6.8 this morning and he is currently getting 1 unit of PRBC.  Iron studies B12 checked last month were normal.  ECOG PS- 2  Pain scale- 7   Review of systems- Review of Systems  Constitutional: Positive for malaise/fatigue. Negative for chills, fever and weight loss.  HENT: Negative for congestion, ear discharge and nosebleeds.   Eyes: Negative for blurred vision.  Respiratory: Negative for cough, hemoptysis, sputum production, shortness of breath and wheezing.   Cardiovascular: Positive for leg swelling. Negative for chest pain, palpitations, orthopnea and claudication.  Gastrointestinal: Negative for abdominal pain, blood in stool, constipation, diarrhea, heartburn, melena, nausea and vomiting.  Genitourinary: Negative for dysuria, flank pain, frequency, hematuria  and urgency.  Musculoskeletal: Positive for back pain. Negative for joint pain and myalgias.  Skin: Negative for rash.  Neurological: Negative for dizziness, tingling, focal weakness, seizures, weakness and headaches.  Endo/Heme/Allergies: Does not bruise/bleed easily.  Psychiatric/Behavioral: Negative for depression and suicidal ideas. The patient does not have insomnia.     No Known Allergies  Patient Active Problem List   Diagnosis Date Noted  . Back pain, acute 09/11/2017  . Dysphagia 09/04/2017  . Goals of care, counseling/discussion 08/18/2017  . Multiple myeloma not having achieved remission ([place]) 08/18/2017  . Multiple myeloma ([place]) 08/18/2017  . Adenopathy   . SOBOE (shortness of breath on exertion) 03/13/2017  . Renal mass 03/03/2017  . 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 Iron deficiency anemia 02/22/2017  . Atherosclerosis of native artery of both lower extremities with intermittent claudication ([place]) 02/21/2017  . Atherosclerosis of native arteries of extremity with intermittent claudication ([place]) 07/08/2016  . Hyperlipidemia 07/08/2016  . Coronary atherosclerosis 07/08/2016  . Diabetes ([place]) 07/08/2016  . Bilateral leg edema 05/09/2015  . Biventricular ICD (implantable cardioverter-defibrillator) in place 05/09/2015  . Biventricular implantable cardioverter-defibrillator in situ 04/12/2015  . Acute combined systolic and diastolic congestive heart failure ([place]) 04/11/2015  . Essential hypertension 03/30/2015  . Left carotid bruit 03/30/2015  . Microcytic anemia 03/30/2015  . Weak pulse 03/30/2015  . Mitral valve insufficiency 03/22/2015  . Moderate mitral insufficiency 03/22/2015  . Benign essential hypertension 03/02/2015  . Chronic systolic CHF (congestive heart failure), NYHA class 2 ([place]) 10/25/2014  . Chronic systolic heart failure ([place]) 10/25/2014  . Acute kidney injury (nontraumatic) ([place]) 10/18/2014  . Complicated UTI (urinary tract infection)  10/18/2014  . Pyelonephritis 10/18/2014  .  Congestive cardiomyopathy ([place]) 05/11/2014  . Colon polyp 01/01/2013  . Benign prostatic hyperplasia without lower urinary tract symptoms 06/12/2012  . Elevated prostate specific antigen (PSA) 06/12/2012  . Chronic anemia 03/18/2012  . Referral of patient 03/18/2012  . Sepsis due to urinary tract infection ([place]) 03/18/2012  . Sepsis secondary to UTI ([place]) 03/18/2012  . Elevated PSA 03/17/2012  . Hypertension 03/17/2012  . LBBB (left bundle branch block) 03/17/2012  . Left bundle branch block (LBBB) 03/17/2012  . Microalbuminuria 03/17/2012  . Retinopathy 03/17/2012  . Atherosclerosis 05/28/2011     Past Medical History:  Diagnosis Date  . AICD (automatic cardioverter/defibrillator) present   . Anemia   . Blockage of coronary artery of heart ([place])   . CHF (congestive heart failure) ([place])   . Chronic kidney disease 05/22/2017   nephrectomy-right  . Diabetes mellitus without complication ([place])   . Dysrhythmia   . Heart murmur   . Hypertension   . Hypochloremia   . LBBB (left bundle branch block)   . Neuropathy   . Presence of permanent cardiac pacemaker   . Right renal mass   . Smoldering myeloma [facility])      Past Surgical History:  Procedure Laterality Date  . CARDIAC SURGERY    . CORONARY ARTERY BYPASS GRAFT    . ENDOBRONCHIAL ULTRASOUND N/A 04/03/2017   Procedure: ENDOBRONCHIAL ULTRASOUND;  Surgeon: Flora Lipps, MD;  Location: [facility] ORS;  Service: Cardiopulmonary;  Laterality: N/A;  . Heart Bypass    . PACEMAKER PLACEMENT     ICD    Social History   Socioeconomic History  . Marital status: Married    Spouse name: Not on file  . Number of children: Not on file  . Years of education: Not on file  . Highest education level: Not on file  Social Needs  . Financial resource strain: Not on file  . Food insecurity - worry: Not on file  . Food insecurity - inability: Not on file  . Transportation needs - medical: Not on  file  . Transportation needs - non-medical: Not on file  Occupational History  . Not on file  Tobacco Use  . Smoking status: Former Smoker    Years: 10.00    Types: Cigars    Last attempt to quit: 10/04/1993    Years since quitting: 23.9  . Smokeless tobacco: Never Used  . Tobacco comment: 3 CIGARS PER DAY  Substance and Sexual Activity  . Alcohol use: No  . Drug use: No  . Sexual activity: Not on file  Other Topics Concern  . Not on file  Social History Narrative  . Not on file     Family History  Adopted: Yes  Problem Relation Age of Onset  . Heart failure Paternal Grandmother      Current Facility-Administered Medications:  .  0.9 %  sodium chloride infusion, , Intravenous, Continuous, Wieting, Richard, MD, Last Rate: 40 mL/hr at 09/11/17 0814 .  0.9 %  sodium chloride infusion, , Intravenous, Once, Pyreddy, Pavan, MD .  acetaminophen (TYLENOL) tablet 650 mg, 650 mg, Oral, Q6H PRN **OR** acetaminophen (TYLENOL) suppository 650 mg, 650 mg, Rectal, Q6H PRN, Amelia Jo, MD .  acyclovir (ZOVIRAX) 200 MG capsule 400 mg, 400 mg, Oral, Daily, Amelia Jo, MD, 400 mg at 09/11/17 3546 .  atorvastatin (LIPITOR) tablet 40 mg, 40 mg, Oral, QHS, Amelia Jo, MD .  bisacodyl (DULCOLAX) EC tablet 5 mg, 5 mg, Oral, Daily PRN, Amelia Jo, MD .  carvedilol (  COREG) tablet 25 mg, 25 mg, Oral, BID WC, Amelia Jo, MD, 25 mg at 09/11/17 7915 .  docusate sodium (COLACE) capsule 100 mg, 100 mg, Oral, BID, Amelia Jo, MD, 100 mg at 09/11/17 0807 .  fentaNYL (DURAGESIC - dosed mcg/hr) 12.5 mcg, 12.5 mcg, Transdermal, Q72H, Amelia Jo, MD, 12.5 mcg at 09/11/17 0358 .  fentaNYL (SUBLIMAZE) injection 50 mcg, 50 mcg, Intravenous, Q1H PRN, Merlyn Lot, MD, 50 mcg at 09/11/17 0212 .  gabapentin (NEURONTIN) capsule 100 mg, 100 mg, Oral, Daily PRN, Amelia Jo, MD .  heparin injection 5,000 Units, 5,000 Units, Subcutaneous, Q12H, Amelia Jo, MD, 5,000 Units at 09/11/17 0400 .   hydrALAZINE (APRESOLINE) tablet 25 mg, 25 mg, Oral, TID, Amelia Jo, MD, 25 mg at 09/11/17 1037 .  HYDROcodone-acetaminophen (NORCO/VICODIN) 5-325 MG per tablet 1-2 tablet, 1-2 tablet, Oral, Q4H PRN, Amelia Jo, MD, 2 tablet at 09/11/17 0359 .  insulin aspart (novoLOG) injection 0-5 Units, 0-5 Units, Subcutaneous, QHS, Wieting, Richard, MD .  insulin aspart (novoLOG) injection 0-9 Units, 0-9 Units, Subcutaneous, TID WC, Wieting, Richard, MD .  insulin aspart protamine- aspart (NOVOLOG MIX 70/30) injection 12 Units, 12 Units, Subcutaneous, BID AC, Amelia Jo, MD, 12 Units at 09/11/17 [phone] .  ondansetron (ZOFRAN) tablet 4 mg, 4 mg, Oral, Q6H PRN **OR** ondansetron (ZOFRAN) injection 4 mg, 4 mg, Intravenous, Q6H PRN, Amelia Jo, MD .  oxyCODONE (Oxy IR/ROXICODONE) immediate release tablet 5 mg, 5 mg, Oral, Q6H PRN, Amelia Jo, MD, 5 mg at 09/11/17 7948 .  tamsulosin (FLOMAX) capsule 0.4 mg, 0.4 mg, Oral, Daily, Amelia Jo, MD, 0.4 mg at 09/11/17 0165 .  traZODone (DESYREL) tablet 25 mg, 25 mg, Oral, QHS PRN, Amelia Jo, MD .  vitamin B-12 (CYANOCOBALAMIN) tablet 1,000 mcg, 1,000 mcg, Oral, Daily, Amelia Jo, MD, 1,000 mcg at 09/11/17 5374 .  vitamin C (ASCORBIC ACID) tablet 500 mg, 500 mg, Oral, Daily, Amelia Jo, MD, 500 mg at 09/11/17 8270   Physical exam:  Vitals:   09/11/17 0230 09/11/17 0300 09/11/17 0806 09/11/17 1202  BP: (!) 145/55 (!) 131/44 (!) 122/57 (!) 103/37  Pulse: 73 64 77 68  Resp: _0 Temp:  98 F (36.7 C)  98.2 F (36.8 C)  TempSrc:    Oral  SpO2: 98% 100% 100% 97%  Weight:  208 lb 14.4 oz (94.8 kg)    Height:  6' (1.829 m)     Physical Exam  Constitutional: He is oriented to person, place, and time and well-developed, well-nourished, and in no distress.  Appears fatigued  HENT:  Head: Normocephalic and atraumatic.  Eyes: EOM are normal. Pupils are equal, round, and reactive to light.  Neck: Normal range of motion.  Cardiovascular:  Normal rate, regular rhythm and normal heart sounds.  Pulmonary/Chest: Effort normal and breath sounds normal.  Abdominal: Soft. Bowel sounds are normal.  Musculoskeletal: He exhibits edema.  Neurological: He is alert and oriented to person, place, and time.  Skin: Skin is warm and dry.       CMP Latest Ref Rng & Units 09/11/2017  Glucose 65 - 99 mg/dL 215(H)  BUN 6 - 20 mg/dL 52(H)  Creatinine 0.61 - 1.24 mg/dL 2.66(H)  Sodium 135 - 145 mmol/L 132(L)  Potassium 3.5 - 5.1 mmol/L 4.3  Chloride 101 - 111 mmol/L 106  CO2 22 - 32 mmol/L 21(L)  Calcium 8.9 - 10.3 mg/dL 6.4(LL)  Total Protein 6.5 - 8.1 g/dL -  Total Bilirubin 0.3 - 1.2 mg/dL -  Alkaline Phos 38 - 126 U/L -  AST 15 - 41 U/L -  ALT 17 - 63 U/L -   CBC Latest Ref Rng & Units 09/11/2017  WBC 3.8 - 10.6 K/uL 4.4  Hemoglobin 13.0 - 18.0 g/dL 6.8(L)  Hematocrit 40.0 - 52.0 % 21.5(L)  Platelets 150 - 440 K/uL 107(L)    _0 @  Dg Chest 2 View  Result Date: 09/04/2017 CLINICAL DATA:  Increased shortness of breath especially upon exertion, known compression fractures, history of CHF, hypertension, neuropathy, pacemaker, myeloma EXAM: CHEST  2 VIEW COMPARISON:  Chest x-ray dated 09/02/2014. FINDINGS: Stable cardiomegaly. Median sternotomy wires appear intact and stable in alignment, related to presumed CABG. Aortic atherosclerosis. Pacemaker/ICD apparatus in place. Lungs appear clear. No pleural effusion or pneumothorax seen. Mild degenerative spurring within the thoracic spine. No acute or suspicious osseous finding. IMPRESSION: No active cardiopulmonary disease. No evidence of pneumonia or pulmonary edema. Stable cardiomegaly. Aortic atherosclerosis. Electronically Signed   By: [name] M.D.   On: 09/04/2017 17:06   Ct Lumbar Spine Wo Contrast  Result Date: 08/28/2017 CLINICAL DATA:  Followup abnormal PET scan at L5. History of multiple myeloma. EXAM: CT LUMBAR SPINE WITHOUT CONTRAST TECHNIQUE: Multidetector CT imaging  of the lumbar spine was performed without intravenous contrast administration. Multiplanar CT image reconstructions were also generated. COMPARISON:  PET scan 08/25/2017 and 10/28/2016. FINDINGS: Segmentation: 5 lumbar type vertebral bodies. Alignment: Normal Vertebrae: Old superior endplate Schmorl's nodes/fractures at L1 and L3. Acute or subacute fracture at the superior endplate of L5 with loss of height centrally of about 25%. No retropulsed bone. The bone underlying the fracture appears somewhat lucent, suggesting that there could be an underlying marrow space lesion. No extraosseous extension of tumor, if present. Lytic areas noted in both iliac bones consistent with the history of myeloma. Paraspinal and other soft tissues: No significant paraspinous soft tissue finding. Disc levels: Solid bridging osteophytes from the thoracic region through L4. Mild chronic disc bulges and facet arthritis without likely significant stenosis. IMPRESSION: Acute or subacute superior endplate fracture at L5 with loss of height centrally of 25%. Slight lucency at the bone underlying the fracture raises the possibility of an underlying marrow space lesion. Lucent areas in both iliac bones suggesting the presence of myeloma in those regions. Electronically Signed   By: [name] M.D.   On: 08/28/2017 11:19   US Renal  Result Date: 09/06/2017 CLINICAL DATA:  Acute kidney failure EXAM: RENAL / URINARY TRACT ULTRASOUND COMPLETE COMPARISON:  CT 10/27/2012 FINDINGS: Right Kidney: Length: Surgically absent. Left Kidney: Length: 11.9 cm. Echogenicity within normal limits. No mass or hydronephrosis visualized. Bladder: Foley catheter in place, decompressed. IMPRESSION: Prior right nephrectomy. Left kidney unremarkable. Electronically Signed   By: [name] M.D.   On: 09/06/2017 15:48   Nm Pet Image Restage (ps) Whole Body  Result Date: 08/25/2017 CLINICAL DATA:  The the treatment strategy for tumor type. EXAM: NUCLEAR  MEDICINE PET WHOLE BODY TECHNIQUE: mCi F-18 FDG was injected intravenously. Full-ring PET imaging was performed from the vertex to the feet after the radiotracer. CT data was obtained and used for attenuation correction and anatomic localization. FASTING BLOOD GLUCOSE:  Value:  mg/dl COMPARISON:  None. FINDINGS: HEAD/NECK No hypermetabolic activity in the scalp. No hypermetabolic cervical lymph nodes. CHEST Persistent hypermetabolic foci identified in the mediastinum and hilar regions. This is similar but slightly decreased compared to prior study. Index prevascular lymph node demonstrates SUV max = 5.4. The heart is enlarged. Left-sided  pacer/AICD again noted. Mild FDG uptake at the area around the battery pack is probably related to granulation tissue. Distal esophageal uptake noted previously is less apparent today. ABDOMEN/PELVIS No abnormal hypermetabolic activity within the liver, pancreas, adrenal glands, or spleen. No hypermetabolic lymph nodes in the abdomen or pelvis. There is abdominal aortic atherosclerosis without aneurysm. Stable 16 mm low-density splenic lesion without hypermetabolic FDG accumulation. SKELETON New uptake is seen diffusely in the L5 vertebral body which has become much more heterogeneous in the interval with areas of lucency. There may be a fracture line in the vertebral body visible on image 227. Activity seen previously and T8 and T10 is not readily evident today. The bony mineralization remains diffusely heterogeneous EXTREMITIES Uptake is identified in the soft tissues posterior to the right calcaneus. This is associated with soft tissue edema in the lower legs bilaterally. IMPRESSION: 1. Interval development of hypermetabolism in the L5 vertebral body. Axial CT imaging raises the question of fracture, possibly pathologic. 2. Activity seen previously at T8 and T10 is not evident on today's exam. 3. Similar but less conspicuous hypermetabolic lymph nodes in the mediastinum and hilar  regions. 4. Focal hypermetabolism in the soft tissues posterior to the right calcaneus with diffuse soft tissue edema seen in the lower extremities bilaterally. Electronically Signed   By: Misty Stanley M.D.   On: 08/25/2017 13:50   Dg Chest Port 1 View  Result Date: 09/10/2017 CLINICAL DATA:  Multiple myeloma, back pain for 4 days EXAM: PORTABLE CHEST 1 VIEW COMPARISON:  09/04/2017 FINDINGS: There is no focal parenchymal opacity. There is no pleural effusion or pneumothorax. There is stable cardiomegaly. There is a 3 lead cardiac pacemaker. There is evidence of prior CABG. The osseous structures are unremarkable. IMPRESSION: No active disease. Electronically Signed   By: Kathreen Devoid   On: 09/10/2017 21:43   Ct Bone Marrow Biopsy & Aspiration  Result Date: 08/20/2017 CLINICAL DATA:  Multiple myeloma. Need for bone marrow biopsy to establish status of plasma cells in the bone marrow. EXAM: CT GUIDED BONE MARROW ASPIRATION AND BIOPSY ANESTHESIA/SEDATION: Versed 3.0 mg IV, Fentanyl 75 mcg IV Total Moderate Sedation Time:  17 minutes. The patient's level of consciousness and physiologic status were continuously monitored during the procedure by Radiology nursing. PROCEDURE: The procedure risks, benefits, and alternatives were explained to the patient. Questions regarding the procedure were encouraged and answered. The patient understands and consents to the procedure. A time out was performed prior to initiating the procedure. The right gluteal region was prepped with chlorhexidine. Sterile gown and sterile gloves were used for the procedure. Local anesthesia was provided with 1% Lidocaine. Under CT guidance, an 11 gauge On Control bone cutting needle was advanced from a posterior approach into the right iliac bone. Needle positioning was confirmed with CT. Initial non heparinized and heparinized aspirate samples were obtained of bone marrow. Core biopsy was performed via the On Control drill needle.  COMPLICATIONS: None FINDINGS: Inspection of initial aspirate did reveal visible particles. Intact core biopsy sample was obtained. IMPRESSION: CT guided bone marrow biopsy of right posterior iliac bone with both aspirate and core samples obtained. Electronically Signed   By: Aletta Edouard M.D.   On: 08/20/2017 09:58    Assessment and plan- Patient is a 72 y.o. male with IgG lambda multiple myeloma status post 1 cycle of Revlimid Velcade and Decadron  1.  Back pain from L5 pathologic fracture: I did touch base with IR and they will be doing kyphoplasty  tomorrow. He is also scheduled to start radiation treatment on 09/15/2017.  However I do feel that his pain would be better served with a kyphoplasty.  Continue oxycodone and fentanyl patch in the meanwhile  2.  Microcytic anemia: Patient's microcytosis is chronic secondary to thalassemia trait and not indicated of iron deficiency.  Recent iron studies checked in December 2018 did not reveal any iron deficiency.  I have added iron studies and B12 to his labs tomorrow.  Agree with supportive transfusion at this time.  His anemia is likely secondary to his underlying multiple myeloma as well as Revlimid.  I suspect that his anemia will recover after a few cycles of chemotherapy  3.  Thrombocytopenia secondary to Revlimid.  Counts have improved as compared to last week and he can safely undergo kyphoplasty without the need for platelet transfusion at this time  4.  Multiple myeloma: I will see him as an outpatient and restart cycle #2 depending on the timing of his radiation treatments.  I will likely hold his Revlimid during radiation but continue Velcade  5. Hypocalcemia: Corrected calcium after adjusting for albumin is around 8.  Please start him on oral calcium 1200 mg daily   Thank you for this kind referral and the opportunity to participate in the care of this patient   Visit Diagnosis 1. Chronic midline low back pain without sciatica   2.  Acute on chronic renal insufficiency   3. Hypotension due to hypovolemia   4. L5 vertebral fracture (HCC)     Dr. Randa Evens, MD, MPH [facility] Pager- [phone] 09/11/2017

## 2017-09-11 NOTE — Telephone Encounter (Signed)
I called and spoke to wife and let her know that we have cancelled the [place] appt while pt is going through radiation.  We have made him appt for tues in [place] to have labs, see md and treatment velcade and he should finish up around 12:30 and he can sit there til 1:15 when he can go to radiation. Wife agreeable

## 2017-09-11 NOTE — Progress Notes (Signed)
Patient ID: Adam Wall, male   DOB: 19-Aug-1946, 72 y.o.   MRN: [id number]     Referring Physician(s): Janese Banks (Oncology)  Patient Status:  [facility] - In-pt  Chief Complaint:  Pathologic compression fracture of L5  Subjective:  Adam Wall is known to the interventional radiology Department as he was evaluated by my partner, Dr. Anselm Pancoast for potential fluoroscopic guided kyphoplasty/Osteocool on 09/04/2017.   Unfortunately, the procedure could not be performed at that time secondary to patient's elevated glucose levels and diffuse anasarca. Patient was subsequently discharged on 09/08/2017 only to return to the Hospital 2 days later with recurrence of lifestyle limiting back pain. As such, request has been made for potential inpatient kyphoplasty/Osteocool procedure.  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  Patient is otherwise without complaint. Specifically, no lower extremity weakness. No change in bowel or bladder function. No chest pain and soreness of breath. No fever or chills.  Allergies: Patient has no known allergies.  Medications: Prior to Admission medications   Medication Sig Start Date End Date Taking? Authorizing Provider  acetaminophen (TYLENOL) 500 MG tablet Take 1,000 mg by mouth every 8 (eight) hours as needed for mild pain or moderate pain.   Yes [provider]  acyclovir (ZOVIRAX) 400 MG tablet Take 1 tablet (400 mg total) by mouth daily. 08/18/17  Yes Sindy Guadeloupe, MD  atorvastatin (LIPITOR) 40 MG tablet Take 40 mg by mouth at bedtime. 02/27/17  Yes [provider]  carvedilol (COREG) 25 MG tablet Take 25 mg by mouth 2 (two) times daily with a meal.   Yes [provider]  furosemide (LASIX) 40 MG tablet Take 20-40 mg by mouth daily. Pt takes 1 pill one day and alternates with 0.5 pill the next day ( this is  alternating dose every other day)   Yes [provider]  gabapentin (NEURONTIN) 100 MG capsule Take 100 mg by mouth daily as needed (pain).    Yes [provider]  hydrALAZINE (APRESOLINE) 25 MG tablet Take 25 mg by mouth 3 (three) times daily.   Yes [provider]  insulin NPH-regular Human (NOVOLIN 70/30) (70-30) 100 UNIT/ML injection Inject 12-18 Units into the skin 2 (two) times daily. 18u (AM) and 12u (PM)   Yes [provider]  loperamide (IMODIUM) 2 MG capsule Take 1 capsule (2 mg total) by mouth as needed for diarrhea or loose stools. 09/08/17  Yes Dustin Flock, MD  oxyCODONE (OXY IR/ROXICODONE) 5 MG immediate release tablet Take 1 tablet (5 mg total) by mouth every 6 (six) hours as needed for severe pain. 09/08/17  Yes Dustin Flock, MD  tamsulosin (FLOMAX) 0.4 MG CAPS capsule Take 0.4 mg by mouth daily.   Yes [provider]  vitamin B-12 (CYANOCOBALAMIN) 1000 MCG tablet Take 1,000 mcg by mouth daily.   Yes [provider]  vitamin C (ASCORBIC ACID) 500 MG tablet Take 500 mg by mouth daily.   Yes [provider]  fentaNYL (DURAGESIC - DOSED MCG/HR) 12 MCG/HR Place 1 patch (12.5 mcg total) onto the skin every 3 (three) days. 09/08/17   Dustin Flock, MD     Vital Signs: BP (!) 122/57 (BP Location: Left Arm)   Pulse 77   Temp 98 F (36.7 C)   Resp 15   Ht 6' (1.829 m)   Wt 208 lb 14.4 oz (94.8 kg)   SpO2 100%  BMI 28.33 kg/m   Physical Exam  Constitutional: He appears well-developed and well-nourished.  HENT:  Head: Normocephalic and atraumatic.  Cardiovascular: Normal rate and regular rhythm.  Pulmonary/Chest: Effort normal and breath sounds normal.  Musculoskeletal:  Patient reports 8 / 10 back pain at rest raising to greater than 10 / 10 with any movement or activity.  Psychiatric: He has a normal mood and affect. His behavior is normal.  Nursing note and vitals reviewed.   Imaging:  Lumbar spine CT  - 08/28/2017: PET CT - 08/25/2017  Dg Chest Port 1 View  Result Date: 09/10/2017 CLINICAL DATA:  Multiple myeloma, back pain for 4 days EXAM: PORTABLE CHEST 1 VIEW COMPARISON:  09/04/2017 FINDINGS: There is no focal parenchymal opacity. There is no pleural effusion or pneumothorax. There is stable cardiomegaly. There is a 3 lead cardiac pacemaker. There is evidence of prior CABG. The osseous structures are unremarkable. IMPRESSION: No active disease. Electronically Signed   By: Kathreen Devoid   On: 09/10/2017 21:43    Labs:  CBC: Recent Labs    09/07/17 1257 09/08/17 0347 09/10/17 2137 09/11/17 0419  WBC 8.1 5.0 6.4 4.4  HGB 8.7* 7.9* 7.6* 6.8*  HCT 27.7* 25.2* 23.9* 21.5*  PLT 26* 40* 111* 107*    COAGS: Recent Labs    10/14/16 0805 08/20/17 0748 09/04/17 1325  INR 1.01 1.11 1.05  APTT 28 31  --     BMP: Recent Labs    09/07/17 0658 09/08/17 0347 09/10/17 2137 09/11/17 0419  NA 140 139 131* 132*  K 4.0 3.7 4.8 4.3  CL 112* 112* 103 106  CO2 22 21* 19* 21*  GLUCOSE 211* 163* 249* 215*  BUN 59* 46* 55* 52*  CALCIUM 7.4* 6.9* 6.9* 6.4*  CREATININE 2.41* 2.19* 2.75* 2.66*  GFRNONAA 25* 28* 21* 22*  GFRAA 29* 33* 25* 26*    LIVER FUNCTION TESTS: Recent Labs    07/30/17 0910 08/25/17 1323 09/04/17 1200 09/08/17 0347 09/10/17 2137  BILITOT 0.5 0.6 0.9  --  0.9  AST 57* 86* 18  --  17  ALT 92* 145* 54  --  39  ALKPHOS 120 125 81  --  70  PROT 8.9* 9.3* 6.5  --  6.1*  ALBUMIN 3.3* 3.4* 2.9* 2.2* 2.8*    Assessment and Plan:  Adam Wall is known to the interventional radiology Department as he was evaluated by my partner, Dr. Anselm Pancoast for potential fluoroscopic guided kyphoplasty/Osteocool on 09/04/2017.   Unfortunately, the procedure could not be performed at that time secondary to patient's elevated glucose levels and diffuse anasarca.  Patient was subsequently discharged on 09/08/2017 only to return to the Hospital 2 days later with recurrence of lifestyle  limiting back pain. As such, request has been made for potential inpatient kyphoplasty/Osteocool procedure.  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  Both the Lumbar spine CT from 08/28/2017 and PET CT from 08/25/2017 were personally reviewed.  On physical examination, the patient's diffuse anasarca has improved, and I feel that it will be very difficult for the patient to remain functionally independent until this intervention is undertaken.  Given patient symptoms and examination as well as imaging findings, I feel this patient remains a good candidate for fluoroscopic guided kyphoplasty and biopsy.  Unfortunately, given the presence of an AICD pacemaker, we cannot perform an Osteocool (this ablation device utilizes radiofrequency ablation.)  Risks and benefits of L5 KP and biopsy were discussed with the patient including, but not limited to education regarding the natural healing process of compression fractures without intervention, bleeding, infection, cement migration which may cause spinal cord damage, paralysis, pulmonary embolism or even death.  This interventional procedure involves the use of X-rays and because of the nature of the planned procedure, it is possible that we will have prolonged use of X-ray fluoroscopy.  Potential radiation risks to you include (but are not limited to) the following: - A slightly elevated risk for cancer  several years later in life. This risk is typically less than 0.5% percent. This risk is low in comparison to the normal incidence of human cancer, which is 33% for women and 50% for men according to the [place]. - Radiation induced injury can include skin redness, resembling a rash, tissue breakdown / ulcers and hair loss (which can be temporary or permanent).   The likelihood of either of these occurring  depends on the difficulty of the procedure and whether you are sensitive to radiation due to previous procedures, disease, or genetic conditions.   IF your procedure requires a prolonged use of radiation, you will be notified and given written instructions for further action.  It is your responsibility to monitor the irradiated area for the 2 weeks following the procedure and to notify your physician if you are concerned that you have suffered a radiation induced injury.    All of the patient's questions were answered, patient is agreeable to proceed.  As such, we will plan to perform the L5 KP and biopsy tomorrow @11 :30. - Pt is to be NPO after midnight. - Hold anticoagulation. - Will obtain PT/INR, PTT and CBC.   Electronically Signed: Sandi Mariscal, MD 09/11/2017, 11:59 AM   I spent a total of 25 Minutes at the the patient's bedside AND on the patient's hospital floor or unit, greater than 50% of which was counseling/coordinating care for fluoro guided L5 KP.

## 2017-09-11 NOTE — ED Notes (Signed)
Pt is to be admitted. Explained it may be awhile before the admitting MD gets there. Pt given a bedside table, pain meds, bed adjusted and the lights off for comfort.

## 2017-09-11 NOTE — Progress Notes (Signed)
Patient ID: Adam Wall, male   DOB: 12/23/1945, 72 y.o.   MRN: [id number]  [facility] PROGRESS NOTE  Adam Wall RXV:[id number] DOB: Dec 30, 1945 DOA: 09/10/2017 PCP: [name], MD  HPI/Subjective: Patient still having back pain.  Pain can go up to 7 out of 10 in intensity.  Patient receiving blood transfusion for low hemoglobin.  Objective: Vitals:   09/11/17 1227 09/11/17 1228  BP: (!) 113/32   Pulse: 69   Resp: 18   Temp: (!) 100.4 F (38 C) 99 F (37.2 C)  SpO2: 98%     Filed Weights   09/10/17 2105 09/11/17 0300  Weight: 95.3 kg (210 lb) 94.8 kg (208 lb 14.4 oz)    ROS: Review of Systems  Constitutional: Negative for chills and fever.  Eyes: Negative for blurred vision.  Respiratory: Negative for cough and shortness of breath.   Cardiovascular: Negative for chest pain.  Gastrointestinal: Negative for abdominal pain, constipation, diarrhea, nausea and vomiting.  Genitourinary: Negative for dysuria.  Musculoskeletal: Positive for back pain. Negative for joint pain.  Neurological: Negative for dizziness and headaches.   Exam: Physical Exam  Constitutional: He is oriented to person, place, and time.  HENT:  Nose: No mucosal edema.  Mouth/Throat: No oropharyngeal exudate or posterior oropharyngeal edema.  Eyes: Conjunctivae, EOM and lids are normal. Pupils are equal, round, and reactive to light.  Neck: No JVD present. Carotid bruit is not present. No edema present. No thyroid mass and no thyromegaly present.  Cardiovascular: S1 normal and S2 normal. Exam reveals no gallop.  No murmur heard. Pulses:      Dorsalis pedis pulses are 2+ on the right side, and 2+ on the left side.  Respiratory: No respiratory distress. He has no wheezes. He has no rhonchi. He has no rales.  GI: Soft. Bowel sounds are normal. There is no tenderness.  Musculoskeletal:       Right ankle: He exhibits no swelling.       Left ankle: He exhibits no swelling.   Lymphadenopathy:    He has no cervical adenopathy.  Neurological: He is alert and oriented to person, place, and time. No cranial nerve deficit.  Skin: Skin is warm. No rash noted. Nails show no clubbing.  Psychiatric: He has a normal mood and affect.      Data Reviewed: Basic Metabolic Panel: Recent Labs  Lab 09/06/17 0610 09/07/17 0658 09/08/17 0347 09/10/17 2137 09/11/17 0419  NA 140 140 139 131* 132*  K 4.3 4.0 3.7 4.8 4.3  CL 113* 112* 112* 103 106  CO2 23 22 21* 19* 21*  GLUCOSE 261* 211* 163* 249* 215*  BUN 72* 59* 46* 55* 52*  CREATININE 2.63* 2.41* 2.19* 2.75* 2.66*  CALCIUM 7.6* 7.4* 6.9* 6.9* 6.4*  PHOS  --   --  2.1*  --   --    Liver Function Tests: Recent Labs  Lab 09/08/17 0347 09/10/17 2137  AST  --  17  ALT  --  39  ALKPHOS  --  70  BILITOT  --  0.9  PROT  --  6.1*  ALBUMIN 2.2* 2.8*    CBC: Recent Labs  Lab 09/06/17 0610 09/07/17 1257 09/08/17 0347 09/10/17 2137 09/11/17 0419  WBC 8.4 8.1 5.0 6.4 4.4  NEUTROABS  --  6.4  --  4.5  --   HGB 8.9* 8.7* 7.9* 7.6* 6.8*  HCT 27.9* 27.7* 25.2* 23.9* 21.5*  MCV 70.5* 71.3* 70.5* 70.6* 70.5*  PLT 36* 26*  40* 111* 107*   Cardiac Enzymes: Recent Labs  Lab 09/04/17 2037 09/10/17 2137  TROPONINI <0.03 0.05*   BNP (last 3 results) Recent Labs    09/04/17 1325 09/10/17 2113  BNP 245.0* 137.0*   CBG: Recent Labs  Lab 09/07/17 2146 09/08/17 0722 09/08/17 0900 09/08/17 1128 09/11/17 1200  GLUCAP 220* 174* 179* 193* 156*    Recent Results (from the past 240 hour(s))  Urine Culture     Status: Abnormal   Collection Time: 09/04/17  6:45 PM  Result Value Ref Range Status   Specimen Description   Final    URINE, RANDOM Performed at [facility], [address], [place], [place] [address]    Special Requests   Final    NONE Performed at [facility], [address] [place], [place] [address]    Culture MULTIPLE SPECIES PRESENT, SUGGEST RECOLLECTION (A)  Final    Report Status 09/06/2017 FINAL  Final  C difficile quick scan w PCR reflex     Status: None   Collection Time: 09/07/17 12:11 PM  Result Value Ref Range Status   C Diff antigen NEGATIVE NEGATIVE Final   C Diff toxin NEGATIVE NEGATIVE Final   C Diff interpretation No C. difficile detected.  Final    Comment: Performed at [facility], [address] [place], [place] [address]  Blood Culture (routine x 2)     Status: None (Preliminary result)   Collection Time: 09/10/17  9:36 PM  Result Value Ref Range Status   Specimen Description BLOOD LT [facility]  Final   Special Requests   Final    BOTTLES DRAWN AEROBIC AND ANAEROBIC Blood Culture adequate volume   Culture   Final    NO GROWTH < 12 HOURS Performed at [facility], [address] [place], [place] [address]    Report Status PENDING  Incomplete  Blood Culture (routine x 2)     Status: None (Preliminary result)   Collection Time: 09/10/17  9:36 PM  Result Value Ref Range Status   Specimen Description BLOOD RT [facility]  Final   Special Requests   Final    BOTTLES DRAWN AEROBIC AND ANAEROBIC Blood Culture adequate volume   Culture   Final    NO GROWTH < 12 HOURS Performed at [facility], [address], [place], [place] [address]    Report Status PENDING  Incomplete     Studies: Dg Chest Port 1 View  Result Date: 09/10/2017 CLINICAL DATA:  Multiple myeloma, back pain for 4 days EXAM: PORTABLE CHEST 1 VIEW COMPARISON:  09/04/2017 FINDINGS: There is no focal parenchymal opacity. There is no pleural effusion or pneumothorax. There is stable cardiomegaly. There is a 3 lead cardiac pacemaker. There is evidence of prior CABG. The osseous structures are unremarkable. IMPRESSION: No active disease. Electronically Signed   By: Kathreen Devoid   On: 09/10/2017 21:43    Scheduled Meds: . acyclovir  400 mg Oral Daily  . atorvastatin  40 mg Oral QHS  . calcium-vitamin D  1 tablet Oral TID WC  . carvedilol  25 mg Oral BID  WC  . docusate sodium  100 mg Oral BID  . fentaNYL  12.5 mcg Transdermal Q72H  . furosemide  40 mg Intravenous Once  . hydrALAZINE  25 mg Oral TID  . insulin aspart  0-5 Units Subcutaneous QHS  . insulin aspart  0-9 Units Subcutaneous TID WC  . insulin aspart protamine- aspart  12 Units  Subcutaneous BID AC  . tamsulosin  0.4 mg Oral Daily  . vitamin B-12  1,000 mcg Oral Daily  . vitamin C  500 mg Oral Daily   Continuous Infusions: . sodium chloride Stopped (09/11/17 1212)  . cefTRIAXone (ROCEPHIN)  IV      Assessment/Plan:  1. Compression fracture.  Interventional radiology planning on kyphoplasty for tomorrow. 2. Advanced multiple myeloma.  Follow-up with oncology as outpatient. 3. Acute on chronic anemia.  Transfuse 1 unit of packed red blood cells today. 4. Thrombocytopenia secondary to multiple myeloma.  This seems better than last admission. 5. Acute kidney failure on chronic kidney disease stage III.  Secondary to multiple myeloma.  Monitor closely.  Check a BMP tomorrow morning. 6. Acute cystitis.  Start Rocephin.  Send off urine culture 7. Type 2 diabetes mellitus.  On sliding scale insulin 8. Hyperlipidemia unspecified on atorvastatin 9. BPH on Flomax  Code Status:     Code Status Orders  (From admission, onward)        Start     Ordered   09/11/17 0309  Full code  Continuous     09/11/17 0308    Code Status History    Date Active Date Inactive Code Status Order ID Comments User Context   09/04/2017 17:55 09/08/2017 18:54 Full Code [id number]  [name], MD ED    Advance Directive Documentation     Most Recent Value  Type of Advance Directive  Healthcare Power of Attorney  Pre-existing out of facility DNR order (yellow form or pink MOST form)  No data  "MOST" Form in Place?  No data     Family Communication: Family at bedside Disposition Plan: To be determined  Antibiotics:  Rocephin  Time spent: 35 minutes  [place]

## 2017-09-12 ENCOUNTER — Inpatient Hospital Stay: Payer: Medicare Other

## 2017-09-12 ENCOUNTER — Ambulatory Visit: Payer: Medicare Other

## 2017-09-12 ENCOUNTER — Encounter: Payer: Self-pay | Admitting: Emergency Medicine

## 2017-09-12 ENCOUNTER — Ambulatory Visit: Payer: Medicare Other | Admitting: Oncology

## 2017-09-12 DIAGNOSIS — L8990 Pressure ulcer of unspecified site, unspecified stage: Secondary | ICD-10-CM

## 2017-09-12 HISTORY — PX: IR KYPHO LUMBAR INC FX REDUCE BONE BX UNI/BIL CANNULATION INC/IMAGING: IMG5519

## 2017-09-12 LAB — CBC
HEMATOCRIT: 24.5 % — AB (ref 40.0–52.0)
HEMOGLOBIN: 7.9 g/dL — AB (ref 13.0–18.0)
MCH: 23.1 pg — AB (ref 26.0–34.0)
MCHC: 32.3 g/dL (ref 32.0–36.0)
MCV: 71.4 fL — ABNORMAL LOW (ref 80.0–100.0)
PLATELETS: 141 10*3/uL — AB (ref 150–440)
RBC: 3.43 MIL/uL — AB (ref 4.40–5.90)
RDW: 19 % — ABNORMAL HIGH (ref 11.5–14.5)
WBC: 4.7 10*3/uL (ref 3.8–10.6)

## 2017-09-12 LAB — GLUCOSE, CAPILLARY
GLUCOSE-CAPILLARY: 96 mg/dL (ref 65–99)
GLUCOSE-CAPILLARY: 108 mg/dL — AB (ref 65–99)
Glucose-Capillary: 94 mg/dL (ref 65–99)
Glucose-Capillary: 91 mg/dL (ref 65–99)
Glucose-Capillary: 211 mg/dL — ABNORMAL HIGH (ref 65–99)

## 2017-09-12 LAB — TYPE AND SCREEN
ABO/RH(D): O POS
Antibody Screen: NEGATIVE
UNIT DIVISION: 0

## 2017-09-12 LAB — FERRITIN: FERRITIN: 643 ng/mL — AB (ref 24–336)

## 2017-09-12 LAB — VITAMIN B12: Vitamin B-12: 808 pg/mL (ref 180–914)

## 2017-09-12 LAB — BASIC METABOLIC PANEL
ANION GAP: 7 (ref 5–15)
BUN: 44 mg/dL — ABNORMAL HIGH (ref 6–20)
CHLORIDE: 107 mmol/L (ref 101–111)
CO2: 20 mmol/L — AB (ref 22–32)
Calcium: 6.7 mg/dL — ABNORMAL LOW (ref 8.9–10.3)
Creatinine, Ser: 2.29 mg/dL — ABNORMAL HIGH (ref 0.61–1.24)
GFR calc Af Amer: 31 mL/min — ABNORMAL LOW (ref >60)
GFR calc non Af Amer: 27 mL/min — ABNORMAL LOW (ref >60)
Glucose, Bld: 97 mg/dL (ref 65–99)
POTASSIUM: 4.4 mmol/L (ref 3.5–5.1)
SODIUM: 134 mmol/L — AB (ref 135–145)

## 2017-09-12 LAB — IRON AND TIBC
IRON: 44 ug/dL — AB (ref 45–182)
SATURATION RATIOS: 34 % (ref 17.9–39.5)
TIBC: 129 ug/dL — AB (ref 250–450)
UIBC: 85 ug/dL

## 2017-09-12 LAB — BPAM RBC
Blood Product Expiration Date: 201901282359
ISSUE DATE / TIME: 201901171157
UNIT TYPE AND RH: 9500

## 2017-09-12 LAB — PROTIME-INR
INR: 1.1
Prothrombin Time: 14.1 seconds (ref 11.4–15.2)

## 2017-09-12 MED ORDER — LIDOCAINE HCL (PF) 1 % IJ SOLN
INTRAMUSCULAR | Status: AC
  Filled 2017-09-12: qty 30

## 2017-09-12 MED ORDER — FENTANYL CITRATE (PF) 100 MCG/2ML IJ SOLN
INTRAMUSCULAR | Status: AC | PRN
  Administered 2017-09-12 (×3): 50 ug via INTRAVENOUS

## 2017-09-12 MED ORDER — AMOXICILLIN-POT CLAVULANATE 875-125 MG PO TABS
1.0000 | ORAL_TABLET | Freq: Two times a day (BID) | ORAL | Status: DC
  Administered 2017-09-12 – 2017-09-16 (×8): 1 via ORAL
  Filled 2017-09-12 (×9): qty 1

## 2017-09-12 MED ORDER — FENTANYL CITRATE (PF) 100 MCG/2ML IJ SOLN
INTRAMUSCULAR | Status: AC
  Filled 2017-09-12: qty 2

## 2017-09-12 MED ORDER — MIDAZOLAM HCL 2 MG/2ML IJ SOLN
INTRAMUSCULAR | Status: AC
  Filled 2017-09-12: qty 2

## 2017-09-12 MED ORDER — FENTANYL CITRATE (PF) 100 MCG/2ML IJ SOLN
50.0000 ug | Freq: Once | INTRAMUSCULAR | Status: AC
  Administered 2017-09-12: 50 ug via INTRAVENOUS

## 2017-09-12 MED ORDER — LIDOCAINE-EPINEPHRINE (PF) 1 %-1:200000 IJ SOLN
INTRAMUSCULAR | Status: AC | PRN
  Administered 2017-09-12: 10 mL

## 2017-09-12 MED ORDER — IOHEXOL 300 MG/ML  SOLN: 60.0000 mL | Freq: Once | INTRAMUSCULAR | Status: DC | PRN

## 2017-09-12 MED ORDER — MIDAZOLAM HCL 2 MG/2ML IJ SOLN
INTRAMUSCULAR | Status: AC | PRN
  Administered 2017-09-12 (×4): 1 mg via INTRAVENOUS

## 2017-09-12 MED ORDER — CEFAZOLIN SODIUM-DEXTROSE 2-4 GM/100ML-% IV SOLN
2.0000 g | INTRAVENOUS | Status: AC
  Administered 2017-09-12: 15:00:00 2 g via INTRAVENOUS
  Filled 2017-09-12: qty 100

## 2017-09-12 MED ORDER — LIDOCAINE-EPINEPHRINE (PF) 1 %-1:200000 IJ SOLN
INTRAMUSCULAR | Status: AC
  Filled 2017-09-12: qty 30

## 2017-09-12 MED ORDER — LIDOCAINE HCL 1 % IJ SOLN
INTRAMUSCULAR | Status: AC | PRN
  Administered 2017-09-12: 10 mL via INTRADERMAL

## 2017-09-12 MED ORDER — POLYETHYLENE GLYCOL 3350 17 G PO PACK
17.0000 g | PACK | Freq: Every day | ORAL | Status: DC
  Administered 2017-09-12: 14:00:00 17 g via ORAL
  Filled 2017-09-12: qty 1

## 2017-09-12 MED ORDER — FENTANYL CITRATE (PF) 100 MCG/2ML IJ SOLN
INTRAMUSCULAR | Status: AC
  Administered 2017-09-12: 50 ug via INTRAVENOUS
  Filled 2017-09-12: qty 2

## 2017-09-12 MED ORDER — SODIUM CHLORIDE 0.9 % IV SOLN
INTRAVENOUS | Status: AC | PRN
  Administered 2017-09-12: 10 mL/h via INTRAVENOUS

## 2017-09-12 NOTE — Progress Notes (Signed)
Pt complained of 9/10 pain.  Gave norco.  Pt now with some mild confusion.  Will continue to monitor. Dorna Bloom RN

## 2017-09-12 NOTE — Progress Notes (Addendum)
Pt continues to be drowsy but awakens when spoken too. He falls back asleep quickly, but VSS: BP (!) 150/53 (BP Location: Left Arm)   Pulse 77   Temp 98.7 F (37.1 C)   Resp 16   Ht 6' (1.829 m)   Wt 212 lb (96.2 kg)   SpO2 97%   BMI 28.75 kg/m . Pt oriented to self and place, was confused about time. Pupils pinpoint bilaterally. Pt moves all limbs purposefully and has equal strength bilaterally. Dr. Leslye Peer notified and stated to continue to monitor pt closely. No other orders received at this time.

## 2017-09-12 NOTE — Procedures (Signed)
Pre procedural Dx: Symptomatic malignant L5 compression fracture Post procedural Dx: Same  Technically successful biopsy and cement augmentation of the L5 vertebral body.  Patient is to lie flat for 3 hours.  EBL: Minimal  Complications: None immediate.  Ronny Bacon, MD Pager #: [phone]

## 2017-09-12 NOTE — Progress Notes (Signed)
Pharmacist - Prescriber Communication  UCx showing enterococcus. Spoke with Dr. Leslye Peer and VORB switch Rocephin to Augmentin 875 mg po BID. Would recommend 10 to 14 day course.  Auryn Paige A. [place], [place].D., BCPS Clinical Pharmacist 09/12/2017 11:55

## 2017-09-12 NOTE — Progress Notes (Signed)
Patient ID: Adam Wall, male   DOB: 05-Sep-1945, 72 y.o.   MRN: [id number]   [facility] PROGRESS NOTE  Adam Wall TMA:[id number] DOB: Aug 14, 1946 DOA: 09/10/2017 PCP: [name], MD  HPI/Subjective: Patient seen this morning prior to kyphoplasty.  Was still having back pain at that time.  Objective: Vitals:   09/12/17 1315 09/12/17 1352  BP:  (!) 127/55  Pulse:  77  Resp:  18  Temp:  98.7 F (37.1 C)  SpO2: 95% 96%    Filed Weights   09/11/17 0300 09/12/17 0539 09/12/17 1032  Weight: 94.8 kg (208 lb 14.4 oz) 96.2 kg (212 lb) 96.2 kg (212 lb)    ROS: Review of Systems  Constitutional: Negative for chills and fever.  Eyes: Negative for blurred vision.  Respiratory: Negative for cough and shortness of breath.   Cardiovascular: Negative for chest pain.  Gastrointestinal: Negative for abdominal pain, constipation, diarrhea, nausea and vomiting.  Genitourinary: Negative for dysuria.  Musculoskeletal: Positive for back pain. Negative for joint pain.  Neurological: Negative for dizziness and headaches.   Exam: Physical Exam  Constitutional: He is oriented to person, place, and time.  HENT:  Nose: No mucosal edema.  Mouth/Throat: No oropharyngeal exudate or posterior oropharyngeal edema.  Eyes: Conjunctivae, EOM and lids are normal. Pupils are equal, round, and reactive to light.  Neck: No JVD present. Carotid bruit is not present. No edema present. No thyroid mass and no thyromegaly present.  Cardiovascular: S1 normal and S2 normal. Exam reveals no gallop.  No murmur heard. Pulses:      Dorsalis pedis pulses are 2+ on the right side, and 2+ on the left side.  Respiratory: No respiratory distress. He has no wheezes. He has no rhonchi. He has no rales.  GI: Soft. Bowel sounds are normal. There is no tenderness.  Musculoskeletal:       Right ankle: He exhibits no swelling.       Left ankle: He exhibits no swelling.  Lymphadenopathy:    He has no  cervical adenopathy.  Neurological: He is alert and oriented to person, place, and time. No cranial nerve deficit.  Skin: Skin is warm. No rash noted. Nails show no clubbing.  Psychiatric: He has a normal mood and affect.      Data Reviewed: Basic Metabolic Panel: Recent Labs  Lab 09/07/17 0658 09/08/17 0347 09/10/17 2137 09/11/17 0419 09/12/17 0510  NA 140 139 131* 132* 134*  K 4.0 3.7 4.8 4.3 4.4  CL 112* 112* 103 106 107  CO2 22 21* 19* 21* 20*  GLUCOSE 211* 163* 249* 215* 97  BUN 59* 46* 55* 52* 44*  CREATININE 2.41* 2.19* 2.75* 2.66* 2.29*  CALCIUM 7.4* 6.9* 6.9* 6.4* 6.7*  PHOS  --  2.1*  --   --   --    Liver Function Tests: Recent Labs  Lab 09/08/17 0347 09/10/17 2137  AST  --  17  ALT  --  39  ALKPHOS  --  70  BILITOT  --  0.9  PROT  --  6.1*  ALBUMIN 2.2* 2.8*    CBC: Recent Labs  Lab 09/07/17 1257 09/08/17 0347 09/10/17 2137 09/11/17 0419 09/11/17 1843 09/12/17 0510  WBC 8.1 5.0 6.4 4.4 4.5 4.7  NEUTROABS 6.4  --  4.5  --   --   --   HGB 8.7* 7.9* 7.6* 6.8* 7.3* 7.9*  HCT 27.7* 25.2* 23.9* 21.5* 22.7* 24.5*  MCV 71.3* 70.5* 70.6* 70.5* 71.2* 71.4*  PLT 26* 40* 111* 107* 124* 141*   Cardiac Enzymes: Recent Labs  Lab 09/10/17 2137  TROPONINI 0.05*   BNP (last 3 results) Recent Labs    09/04/17 1325 09/10/17 2113  BNP 245.0* 137.0*   CBG: Recent Labs  Lab 09/11/17 1638 09/11/17 2119 09/12/17 0740 09/12/17 1037 09/12/17 1352  GLUCAP 128* 113* 94 91 96    Recent Results (from the past 240 hour(s))  Urine Culture     Status: Abnormal   Collection Time: 09/04/17  6:45 PM  Result Value Ref Range Status   Specimen Description   Final    URINE, RANDOM Performed at [facility], [address] [place], [place] [address]    Special Requests   Final    NONE Performed at [facility], [address] [place], [place] [address]    Culture MULTIPLE SPECIES PRESENT, SUGGEST RECOLLECTION (A)  Final   Report  Status 09/06/2017 FINAL  Final  C difficile quick scan w PCR reflex     Status: None   Collection Time: 09/07/17 12:11 PM  Result Value Ref Range Status   C Diff antigen NEGATIVE NEGATIVE Final   C Diff toxin NEGATIVE NEGATIVE Final   C Diff interpretation No C. difficile detected.  Final    Comment: Performed at [facility], [address] [place], [place] [address]  Blood Culture (routine x 2)     Status: None (Preliminary result)   Collection Time: 09/10/17  9:36 PM  Result Value Ref Range Status   Specimen Description BLOOD LT [facility]  Final   Special Requests   Final    BOTTLES DRAWN AEROBIC AND ANAEROBIC Blood Culture adequate volume   Culture   Final    NO GROWTH 2 DAYS Performed at [facility], [address], [place], [place] [address]    Report Status PENDING  Incomplete  Blood Culture (routine x 2)     Status: None (Preliminary result)   Collection Time: 09/10/17  9:36 PM  Result Value Ref Range Status   Specimen Description BLOOD RT [facility]  Final   Special Requests   Final    BOTTLES DRAWN AEROBIC AND ANAEROBIC Blood Culture adequate volume   Culture   Final    NO GROWTH 2 DAYS Performed at [facility], [address] [place], [place] [address]    Report Status PENDING  Incomplete  Urine culture     Status: Abnormal (Preliminary result)   Collection Time: 09/11/17  5:42 AM  Result Value Ref Range Status   Specimen Description   Final    URINE, RANDOM Performed at [facility], [address], [place], [place] [address]    Special Requests   Final    NONE Performed at [facility], [address], [place], [place] [address]    Culture (A)  Final    >=100,000 COLONIES/mL ENTEROCOCCUS FAECALIS SUSCEPTIBILITIES TO FOLLOW Performed at [facility], [place] [address] [place], [place] [address]    Report Status PENDING  Incomplete  MRSA PCR Screening     Status: None   Collection Time: 09/11/17  8:24 PM  Result  Value Ref Range Status   MRSA by PCR NEGATIVE NEGATIVE Final    Comment:        The GeneXpert MRSA Assay (FDA approved for NASAL specimens only), is one component of a comprehensive MRSA colonization surveillance program. It is not intended to diagnose MRSA infection nor to guide or  monitor treatment for MRSA infections. Performed at [facility], [address] [place],  [address]      Studies: Dg Chest [facility] 1 View  Result Date: 09/10/2017 CLINICAL DATA:  Multiple myeloma, back pain for 4 days EXAM: PORTABLE CHEST 1 VIEW COMPARISON:  09/04/2017 FINDINGS: There is no focal parenchymal opacity. There is no pleural effusion or pneumothorax. There is stable cardiomegaly. There is a 3 lead cardiac pacemaker. There is evidence of prior CABG. The osseous structures are unremarkable. IMPRESSION: No active disease. Electronically Signed   By: Kathreen Devoid   On: 09/10/2017 21:43   Ir Kypho Lumbar Inc Fx Reduce Bone Bx Uni/bil Cannulation Inc/imaging  Result Date: 09/12/2017 CLINICAL DATA:  History of multiple myeloma, now with symptomatic malignant L5 compression fracture. Patient with history of AICD/pacemaker and as such, presents today for fluoroscopic guided L5 vertebral body biopsy and cement augmentation. EXAM: FLUOROSCOPIC GUIDED KYPHOPLASTY OF THE L5 VERTEBRAL BODY COMPARISON:  Lumbar spine CT - 08/28/2017; PET-CT - 08/25/2017 MEDICATIONS: As antibiotic prophylaxis, Ancef 2 gm IV was ordered pre-procedure and administered intravenously within 1 hour of incision. ANESTHESIA/SEDATION: Moderate (conscious) sedation was employed during this procedure. A total of Versed 4 mg and Fentanyl 200 mcg was administered intravenously. Moderate Sedation Time: 50 minutes. The patient's level of consciousness and vital signs were monitored continuously by radiology nursing throughout the procedure under my direct supervision. FLUOROSCOPY TIME:  11 Min, 54 seconds (2979 mGy) COMPLICATIONS:  None immediate. TECHNIQUE: The procedure, risks (including but not limited to bleeding, infection, organ damage), benefits, and alternatives were explained to the patient. Questions regarding the procedure were encouraged and answered. The patient understands and consents to the procedure. The patient was placed prone on the fluoroscopic table. The skin overlying the lower lumbar region was then prepped and draped in the usual sterile fashion. Maximal barrier sterile technique was utilized including caps, mask, sterile gowns, sterile gloves, sterile drape, hand hygiene and skin antiseptic. Intravenous Fentanyl and Versed were administered as conscious sedation during continuous cardiorespiratory monitoring by the radiology RN. The left pedicle at L5 was then infiltrated with 1% lidocaine followed by the advancement of a Kyphon trocar needle through the left pedicle into the posterior one-third of the vertebral body. The trocar was removed and a bone biopsy was obtained at this location. Subsequently, the osteo drill was advanced to the anterior third of the vertebral body. The osteo drill was retracted. Through the working cannula, a Kyphon inflatable bone tamp 15 x 3 was advanced and positioned with the distal marker approximately 5 mm from the anterior aspect of the cortex. Appropriate positioning was confirmed on the AP projection. At this time, the balloon was expanded using contrast via a Kyphon inflation syringe device via micro tubing. In similar fashion, the right L5 pedicle was infiltrated with 1% lidocaine followed by the advancement of a second Kyphon trocar needle through the right pedicle into the posterior third of the vertebral body. Again, a bone biopsy was obtained at this location. Subsequently, the osteo drill was coaxially advanced to the anterior right third. The osteo drill was exchanged for a Kyphon inflatable bone tamp 15 x 3, advanced to the 5 mm of the anterior aspect of the cortex. The  balloon was then expanded using contrast as above. Inflations were continued until there was near apposition with the superior end plate. At this time, methylmethacrylate mixture was reconstituted in the Kyphon bone mixing device system. This was then loaded into the delivery mechanism, attached  to Kyphon bone fillers. The balloons were deflated and removed followed by the instillation of methylmethacrylate mixture with excellent filling in the AP and lateral projections. No extravasation was noted in the disk spaces or posteriorly into the spinal canal. No epidural venous contamination was seen. The working cannulae and the bone filler were then retrieved and removed. Hemostasis was achieved with manual compression. The patient tolerated the procedure well without immediate postprocedural complication. IMPRESSION: 1. Technically successful L5 vertebral body augmentation using balloon kyphoplasty. 2. Technically successful L5 vertebral body biopsy. Electronically Signed   By: Sandi Mariscal M.D.   On: 09/12/2017 14:27    Scheduled Meds: . acyclovir  400 mg Oral Daily  . amoxicillin-clavulanate  1 tablet Oral Q12H  . atorvastatin  40 mg Oral QHS  . calcium-vitamin D  1 tablet Oral TID WC  . carvedilol  25 mg Oral BID WC  . docusate sodium  100 mg Oral BID  . fentaNYL  12.5 mcg Transdermal Q72H  . insulin aspart  0-5 Units Subcutaneous QHS  . insulin aspart  0-9 Units Subcutaneous TID WC  . polyethylene glycol  17 g Oral Daily  . tamsulosin  0.4 mg Oral Daily  . vitamin B-12  1,000 mcg Oral Daily  . vitamin C  500 mg Oral Daily   Continuous Infusions:   Assessment/Plan:  1. Lumbar spine compression fracture.  Interventional radiology did a bone biopsy and kyphoplasty today.  Get physical therapy evaluation. 2. Advanced multiple myeloma.  Follow-up with oncology as outpatient. 3. Acute on chronic anemia.  Transfused 1 unit of packed red blood cells with good response.  Today's hemoglobin is  7.9. 4. Thrombocytopenia secondary to multiple myeloma.  This seems better than last admission. 5. Acute kidney failure on chronic kidney disease stage III.  Secondary to multiple myeloma.  Monitor closely.  Check a BMP again tomorrow.  Hold Lasix today. 6. Acute cystitis.  Urine culture growing enterococcus.  Antibiotics changed to Augmentin. 7. Type 2 diabetes mellitus.  On sliding scale insulin.  Held 7030 insulin with being n.p.o. this morning  8. hyperlipidemia unspecified on atorvastatin 9. BPH on Flomax  Code Status:     Code Status Orders  (From admission, onward)        Start     Ordered   09/11/17 0309  Full code  Continuous     09/11/17 0308    Code Status History    Date Active Date Inactive Code Status Order ID Comments User Context   09/04/2017 17:55 09/08/2017 18:54 Full Code [id number]  [name], MD ED    Advance Directive Documentation     Most Recent Value  Type of Advance Directive  Healthcare Power of Attorney  Pre-existing out of facility DNR order (yellow form or pink MOST form)  No data  "MOST" Form in Place?  No data     Family Communication: Family at bedside Disposition Plan: See how the patient does with physical therapy  Antibiotics:  Augmentin  Time spent: 26 minutes  [place]

## 2017-09-12 NOTE — Progress Notes (Signed)
Pt returned to floor from his procedure. He is drowsy but easily awakens to voice. Family is at bedside. Pt refusing his medications at this time. VSS: BP (!) 127/55 (BP Location: Left Arm)   Pulse 77   Temp 98.7 F (37.1 C)   Resp 18   Ht 6' (1.829 m)   Wt 212 lb (96.2 kg)   SpO2 96%   BMI 28.75 kg/m

## 2017-09-13 LAB — BASIC METABOLIC PANEL
ANION GAP: 5 (ref 5–15)
BUN: 41 mg/dL — AB (ref 6–20)
CHLORIDE: 111 mmol/L (ref 101–111)
CO2: 22 mmol/L (ref 22–32)
Calcium: 6.4 mg/dL — CL (ref 8.9–10.3)
Creatinine, Ser: 2.34 mg/dL — ABNORMAL HIGH (ref 0.61–1.24)
GFR calc Af Amer: 30 mL/min — ABNORMAL LOW (ref >60)
GFR, EST NON AFRICAN AMERICAN: 26 mL/min — AB (ref >60)
GLUCOSE: 132 mg/dL — AB (ref 65–99)
POTASSIUM: 5.3 mmol/L — AB (ref 3.5–5.1)
Sodium: 138 mmol/L (ref 135–145)

## 2017-09-13 LAB — CBC
HEMATOCRIT: 24.6 % — AB (ref 40.0–52.0)
HEMOGLOBIN: 8.1 g/dL — AB (ref 13.0–18.0)
MCH: 23.5 pg — ABNORMAL LOW (ref 26.0–34.0)
MCHC: 32.8 g/dL (ref 32.0–36.0)
MCV: 71.5 fL — AB (ref 80.0–100.0)
Platelets: 162 10*3/uL (ref 150–440)
RBC: 3.44 MIL/uL — AB (ref 4.40–5.90)
RDW: 18.4 % — ABNORMAL HIGH (ref 11.5–14.5)
WBC: 3.6 10*3/uL — ABNORMAL LOW (ref 3.8–10.6)

## 2017-09-13 LAB — GLUCOSE, CAPILLARY
GLUCOSE-CAPILLARY: 119 mg/dL — AB (ref 65–99)
GLUCOSE-CAPILLARY: 190 mg/dL — AB (ref 65–99)
Glucose-Capillary: 216 mg/dL — ABNORMAL HIGH (ref 65–99)
Glucose-Capillary: 222 mg/dL — ABNORMAL HIGH (ref 65–99)

## 2017-09-13 LAB — URINE CULTURE: Culture: >=100000 — AB

## 2017-09-13 LAB — POTASSIUM: POTASSIUM: 4.4 mmol/L (ref 3.5–5.1)

## 2017-09-13 MED ORDER — FUROSEMIDE 40 MG PO TABS
40.0000 mg | ORAL_TABLET | Freq: Every day | ORAL | Status: DC
  Administered 2017-09-13 – 2017-09-16 (×4): 40 mg via ORAL
  Filled 2017-09-13 (×4): qty 1

## 2017-09-13 MED ORDER — SODIUM POLYSTYRENE SULFONATE 15 GM/60ML PO SUSP
30.0000 g | Freq: Once | ORAL | Status: AC
  Administered 2017-09-13: 30 g via ORAL
  Filled 2017-09-13: qty 120

## 2017-09-13 NOTE — Evaluation (Signed)
Physical Therapy Evaluation Patient Details Name: Adam Wall MRN: [id number] DOB: 1946/05/10 Today's Date: 09/13/2017   History of Present Illness  Adam Wall is a 72yo black male who comes to [facility] on 09/10/17 c uncontrolled back pain. He was schedule for L5 kyphoplasty, and had been largely bed bound at home for the past several days. PMH: multiple myeolma, CHF, DM2, HTN, anemia, CKD, AICD. PT is s/p 1uPRBC, now Hb: 8.1, HCT: 24.6. Pt is now s/p L5 kyphoplasty (1/18) and with bilat heel pressure ulcers.   Clinical Impression  Pt admitted with above diagnosis. Pt currently with functional limitations due to the deficits listed below (see "PT Problem List"). Upon entry, the patient is received semirecumbent in bed. The pt is awake and agreeable to participate after finishing his breakfast. Pt mildly anxious throughout session, apprehensive about mobility, but motivated to get up so that he can improve confidence with mobility for ADL. Pt reports significant improvemnt in 'severe low back pain;' since surgery, but moreso 7/10 bilat heel pain from pressure ulcers. Pt also c/o some neck spasm when attempting head turn sin bed; no prior hx of neck pain. Functional mobility assessment demonstrates heavy strength impairment globally, the pt now requiring Max assist physical assistance for bed mobility, modA for STS transfers, and very slow AMB with RW < 0.75m/s, whereas the patient performed these at a higher level of independence PTA. Pt has significant SOB p AMB, but denies any dizziness. Pt will benefit from skilled PT intervention to increase independence and safety with basic mobility in preparation for discharge to the venue listed below. Due to concerns over inability to perform household distance AMB, inability to access home d/t stairs, acute decline in independence in toiletting and dressing, and multiple sites of skin breakdown, pt would be good candidate for STR/SNF to improve strength, safety  and independence prior to eventual return to home: discussed in full with patient, who remains undecided at end of session.      Follow Up Recommendations SNF;Supervision for mobility/OOB    Equipment Recommendations  None recommended by PT    Recommendations for Other Services       Precautions / Restrictions Precautions Precautions: Fall;Back Precaution Booklet Issued: No Precaution Comments: educated on log rolling at eval Restrictions Weight Bearing Restrictions: No      Mobility  Bed Mobility Overal bed mobility: Needs Assistance Bed Mobility: Supine to Sit;Rolling Rolling: Mod assist   Supine to sit: Max assist     General bed mobility comments: UE weakness and generalized guarding; has been sleeping in recliner the last few days at home d/t inability to mobilize into the bed.   Transfers Overall transfer level: Needs assistance Equipment used: Rolling walker (2 wheeled) Transfers: Sit to/from Stand Sit to Stand: Mod assist         General transfer comment: multiple attempts required, requires modA to lift and to stabilize  Ambulation/Gait Ambulation/Gait assistance: Min guard Ambulation Distance (Feet): 25 Feet Assistive device: Rolling walker (2 wheeled) Gait Pattern/deviations: Trunk flexed Gait velocity: <0.70m/s  Gait velocity interpretation: <1.8 ft/sec, indicative of risk for recurrent falls General Gait Details: 3-point gait, antalgic.  Stairs            Wheelchair Mobility    Modified Rankin (Stroke Patients Only)       Balance Overall balance assessment: Needs assistance Sitting-balance support: Bilateral upper extremity supported;Feet supported Sitting balance-Leahy Scale: Poor     Standing balance support: During functional activity;Bilateral upper extremity supported Standing balance-Leahy  Scale: Poor                               Pertinent Vitals/Pain Pain Assessment: 0-10 Pain Score: 7  Pain Location:  blat heels (pressure ulcers)  Pain Descriptors / Indicators: Aching Pain Intervention(s): Limited activity within patient's tolerance;Monitored during session;Premedicated before session;Repositioned    Home Living Family/patient expects to be discharged to:: Private residence Living Arrangements: Spouse/significant other Available Help at Discharge: Family Type of Home: House Home Access: Stairs to enter Entrance Stairs-Rails: None Entrance Stairs-Number of Steps: 3   Home Equipment: Environmental consultant - 2 wheels;Toilet riser      Prior Function Level of Independence: Independent               Hand Dominance        Extremity/Trunk Assessment   Upper Extremity Assessment Upper Extremity Assessment: Generalized weakness    Lower Extremity Assessment Lower Extremity Assessment: Generalized weakness       Communication   Communication: No difficulties  Cognition Arousal/Alertness: Awake/alert Behavior During Therapy: WFL for tasks assessed/performed Overall Cognitive Status: Within Functional Limits for tasks assessed                                        General Comments      Exercises General Exercises - Lower Extremity Heel Slides: Supine;AAROM;Both;10 reps Hip ABduction/ADduction: AAROM;Both;10 reps;Supine   Assessment/Plan    PT Assessment Patient needs continued PT services  PT Problem List Decreased strength;Decreased knowledge of use of DME;Decreased range of motion;Decreased activity tolerance;Decreased mobility;Pain;Cardiopulmonary status limiting activity       PT Treatment Interventions DME instruction;Gait training;Stair training;Functional mobility training;Therapeutic activities;Therapeutic exercise;Balance training;Patient/family education    PT Goals (Current goals can be found in the Care Plan section)  Acute Rehab PT Goals Patient Stated Goal: regain strength and independent mobility PT Goal Formulation: With patient Time For  Goal Achievement: 09/20/17 Potential to Achieve Goals: Good    Frequency 7X/week   Barriers to discharge Inaccessible home environment;Decreased caregiver support      Co-evaluation               AM-PAC PT "6 Clicks" Daily Activity  Outcome Measure Difficulty turning over in bed (including adjusting bedclothes, sheets and blankets)?: Unable Difficulty moving from lying on back to sitting on the side of the bed? : Unable Difficulty sitting down on and standing up from a chair with arms (e.g., wheelchair, bedside commode, etc,.)?: Unable Help needed moving to and from a bed to chair (including a wheelchair)?: A Lot Help needed walking in hospital room?: A Little Help needed climbing 3-5 steps with a railing? : Total 6 Click Score: 9    End of Session Equipment Utilized During Treatment: Gait belt Activity Tolerance: Patient limited by fatigue;Patient limited by pain Patient left: in chair;with nursing/sitter in room;with call bell/phone within reach Nurse Communication: Mobility status PT Visit Diagnosis: Unsteadiness on feet (R26.81);Other abnormalities of gait and mobility (R26.89);Difficulty in walking, not elsewhere classified (R26.2)    Time: 3614-4315 PT Time Calculation (min) (ACUTE ONLY): 45 min   Charges:   PT Evaluation $PT Eval Moderate Complexity: 1 Mod PT Treatments $Therapeutic Activity: 23-37 mins   PT G Codes:        12:15 PM, 10/10/2017 Etta Grandchild, PT, DPT Physical Therapist - [place] [facility])  [phone] (mobile)  Dagan Heinz C 09/13/2017, 11:58 AM

## 2017-09-13 NOTE — Plan of Care (Signed)
  Progressing Education: Knowledge of General Education information will improve 09/13/2017 0443 - Progressing by Denice Bors, RN Pain Managment: General experience of comfort will improve 09/13/2017 0443 - Progressing by Denice Bors, RN Safety: Ability to remain free from injury will improve 09/13/2017 0443 - Progressing by Denice Bors, RN Skin Integrity: Risk for impaired skin integrity will decrease 09/13/2017 0443 - Progressing by Denice Bors, RN   Not Progressing Activity: Risk for activity intolerance will decrease 09/13/2017 0443 - Not Progressing by Denice Bors, RN   Completed/Met Bowel/Gastric: Will not experience complications related to bowel motility 09/13/2017 0443 - Completed/Met by Denice Bors, RN Nutritional: Maintenance of adequate nutrition will improve 09/13/2017 0443 - Completed/Met by Denice Bors, RN

## 2017-09-13 NOTE — Progress Notes (Signed)
CRITICAL VALUE STICKER  CRITICAL VALUE: Calcium 6.4  RECEIVER (on-site recipient of call):Kasey  DATE & TIME NOTIFIED: 09/13/17  0550  MESSENGER (representative from lab):  MD NOTIFIED: Dr Estanislado Pandy  TIME OF NOTIFICATION: 0550  RESPONSE: No new orders at this time.

## 2017-09-13 NOTE — NC FL2 (Signed)
[place] LEVEL OF CARE SCREENING TOOL     IDENTIFICATION  Patient Name: Adam Wall Birthdate: 1945-12-04 Sex: male Admission Date (Current Location): 09/10/2017  [employer] and [place] Number:  Engineering geologist and Address:  [facility], [address], [place], [place] [address]      Provider Number: [id number]  Attending Physician Name and Address:  [name], MD  Relative Name and Phone Number:  Raequan Vanschaick (Spouse) [phone]    Current Level of Care: Hospital Recommended Level of Care: [place] Prior Approval Number:    Date Approved/Denied: 09/13/17 PASRR Number: [id number]  Discharge Plan: SNF    Current Diagnoses: Patient Active Problem List   Diagnosis Date Noted  . Pressure injury of skin 09/12/2017  . Back pain, acute 09/11/2017  . Dysphagia 09/04/2017  . Goals of care, counseling/discussion 08/18/2017  . Multiple myeloma not having achieved remission ([place]) 08/18/2017  . Multiple myeloma ([place]) 08/18/2017  . Adenopathy   . SOBOE (shortness of breath on exertion) 03/13/2017  . Renal mass 03/03/2017  . 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 Iron deficiency anemia 02/22/2017  . 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 Atherosclerosis of native arteries of extremity with intermittent claudication ([place]) 07/08/2016  . Hyperlipidemia 07/08/2016  . Coronary atherosclerosis 07/08/2016  . Diabetes ([place]) 07/08/2016  . Bilateral leg edema 05/09/2015  . Biventricular ICD (implantable cardioverter-defibrillator) in place 05/09/2015  . Biventricular implantable cardioverter-defibrillator in situ 04/12/2015  . Acute combined systolic and diastolic congestive heart failure ([place]) 04/11/2015  . Essential hypertension 03/30/2015  . Left carotid bruit 03/30/2015  . Microcytic anemia 03/30/2015  . Weak pulse 03/30/2015  .  Mitral valve insufficiency 03/22/2015  . Moderate mitral insufficiency 03/22/2015  . Benign essential hypertension 03/02/2015  . Chronic systolic CHF (congestive heart failure), NYHA class 2 ([place]) 10/25/2014  . Chronic systolic heart failure ([place]) 10/25/2014  . Acute kidney injury (nontraumatic) ([place]) 10/18/2014  . Complicated UTI (urinary tract infection) 10/18/2014  . Pyelonephritis 10/18/2014  . Congestive cardiomyopathy ([place]) 05/11/2014  . Colon polyp 01/01/2013  . Benign prostatic hyperplasia without lower urinary tract symptoms 06/12/2012  . Elevated prostate specific antigen (PSA) 06/12/2012  . Chronic anemia 03/18/2012  . Referral of patient 03/18/2012  . Sepsis due to urinary tract infection ([place]) 03/18/2012  . Sepsis secondary to UTI ([place]) 03/18/2012  . Elevated PSA 03/17/2012  . Hypertension 03/17/2012  . LBBB (left bundle branch block) 03/17/2012  . Left bundle branch block (LBBB) 03/17/2012  . Microalbuminuria 03/17/2012  . Retinopathy 03/17/2012  . Atherosclerosis 05/28/2011    Orientation RESPIRATION BLADDER Height & Weight     Self, Time, Situation, Place  Normal Continent Weight: 209 lb (94.8 kg) Height:  6' (182.9 cm)  BEHAVIORAL SYMPTOMS/MOOD NEUROLOGICAL BOWEL NUTRITION STATUS      Continent    AMBULATORY STATUS COMMUNICATION OF NEEDS Skin   Extensive Assist Verbally Surgical wounds                       Personal Care Assistance Level of Assistance  Bathing, Feeding, Dressing Bathing Assistance: Limited assistance Feeding assistance: Independent Dressing Assistance: Limited assistance     Functional Limitations Info             SPECIAL CARE FACTORS FREQUENCY  PT (By licensed PT)     PT Frequency: Up to 5/week  Contractures Contractures Info: Not present    Additional Factors Info  Code Status, Allergies, Psychotropic, Insulin Sliding Scale Code Status Info: Full Allergies Info: No Known Allergies Psychotropic Info:  Desyryl Insulin Sliding Scale Info: Novolog: 0-5 units QHS and 0-9 units TID with meals       Current Medications (09/13/2017):  This is the current hospital active medication list Current Facility-Administered Medications  Medication Dose Route Frequency Provider Last Rate Last Dose  . acetaminophen (TYLENOL) tablet 650 mg  650 mg Oral Q6H PRN Amelia Jo, MD       Or  . acetaminophen (TYLENOL) suppository 650 mg  650 mg Rectal Q6H PRN Amelia Jo, MD      . acyclovir (ZOVIRAX) 200 MG capsule 400 mg  400 mg Oral Daily Amelia Jo, MD   400 mg at 09/13/17 0738  . amoxicillin-clavulanate (AUGMENTIN) 875-125 MG per tablet 1 tablet  1 tablet Oral Q12H [name], MD   1 tablet at 09/13/17 0737  . atorvastatin (LIPITOR) tablet 40 mg  40 mg Oral QHS Amelia Jo, MD   40 mg at 09/12/17 2136  . bisacodyl (DULCOLAX) EC tablet 5 mg  5 mg Oral Daily PRN Amelia Jo, MD      . calcium-vitamin D (OSCAL WITH D) 500-200 MG-UNIT per tablet 1 tablet  1 tablet Oral TID WC [name], MD   1 tablet at 09/13/17 1207  . carvedilol (COREG) tablet 25 mg  25 mg Oral BID WC Amelia Jo, MD   25 mg at 09/13/17 0737  . docusate sodium (COLACE) capsule 100 mg  100 mg Oral BID Amelia Jo, MD   100 mg at 09/13/17 0737  . fentaNYL (DURAGESIC - dosed mcg/hr) 12.5 mcg  12.5 mcg Transdermal Q72H Amelia Jo, MD   12.5 mcg at 09/11/17 0358  . furosemide (LASIX) tablet 40 mg  40 mg Oral Daily [name], MD   40 mg at 09/13/17 1207  . gabapentin (NEURONTIN) capsule 100 mg  100 mg Oral Daily PRN Amelia Jo, MD   100 mg at 09/12/17 1948  . HYDROcodone-acetaminophen (NORCO/VICODIN) 5-325 MG per tablet 1-2 tablet  1-2 tablet Oral Q4H PRN Amelia Jo, MD   1 tablet at 09/13/17 1023  . insulin aspart (novoLOG) injection 0-5 Units  0-5 Units Subcutaneous QHS [name], MD   2 Units at 09/12/17 2137  . insulin aspart (novoLOG) injection 0-9 Units  0-9 Units Subcutaneous TID WC [name], MD   2 Units at 09/13/17 1206  . iohexol (OMNIPAQUE) 300 MG/ML solution 60 mL  60 mL Intravenous Once PRN Sandi Mariscal, MD      . ondansetron [facility]) tablet 4 mg  4 mg Oral Q6H PRN Amelia Jo, MD       Or  . ondansetron [facility]) injection 4 mg  4 mg Intravenous Q6H PRN Amelia Jo, MD      . oxyCODONE (Oxy IR/ROXICODONE) immediate release tablet 5 mg  5 mg Oral Q6H PRN Amelia Jo, MD   5 mg at 09/13/17 0802  . tamsulosin (FLOMAX) capsule 0.4 mg  0.4 mg Oral Daily Amelia Jo, MD   0.4 mg at 09/13/17 0737  . traZODone (DESYREL) tablet 25 mg  25 mg Oral QHS PRN Amelia Jo, MD   25 mg at 09/11/17 2130  . vitamin B-12 (CYANOCOBALAMIN) tablet 1,000 mcg  1,000 mcg Oral Daily Amelia Jo, MD   1,000 mcg at 09/13/17 0738  . vitamin C (ASCORBIC ACID) tablet 500 mg  500  mg Oral Daily Amelia Jo, MD   500 mg at 09/13/17 9892     Discharge Medications: Please see discharge summary for a list of discharge medications.  Relevant Imaging Results:  Relevant Lab Results:   Additional Information SS# [id number]  Zettie Pho, LCSW

## 2017-09-13 NOTE — Progress Notes (Signed)
Patient ID: Adam Wall, male   DOB: 1945-09-07, 72 y.o.   MRN: [id number]   [employer] PROGRESS NOTE  Adam Wall JME:[id number] DOB: April 24, 1946 DOA: 09/10/2017 PCP: [name], MD  HPI/Subjective: Patient feeling better with regards to his back pain.  Complains that his heels have been hurting him.  Objective: Vitals:   09/13/17 0452 09/13/17 1431  BP: (!) 153/46 (!) 133/41  Pulse: 76 70  Resp: 18   Temp: 98.7 F (37.1 C) 98.2 F (36.8 C)  SpO2: 99% 95%    Filed Weights   09/12/17 0539 09/12/17 1032 09/13/17 0452  Weight: 96.2 kg (212 lb) 96.2 kg (212 lb) 94.8 kg (209 lb)    ROS: Review of Systems  Constitutional: Negative for chills and fever.  Eyes: Negative for blurred vision.  Respiratory: Negative for cough and shortness of breath.   Cardiovascular: Negative for chest pain.  Gastrointestinal: Negative for abdominal pain, constipation, diarrhea, nausea and vomiting.  Genitourinary: Negative for dysuria.  Musculoskeletal: Positive for back pain. Negative for joint pain.  Neurological: Negative for dizziness and headaches.   Exam: Physical Exam  Constitutional: He is oriented to person, place, and time.  HENT:  Nose: No mucosal edema.  Mouth/Throat: No oropharyngeal exudate or posterior oropharyngeal edema.  Eyes: Conjunctivae, EOM and lids are normal. Pupils are equal, round, and reactive to light.  Neck: No JVD present. Carotid bruit is not present. No edema present. No thyroid mass and no thyromegaly present.  Cardiovascular: S1 normal and S2 normal. Exam reveals no gallop.  No murmur heard. Pulses:      Dorsalis pedis pulses are 2+ on the right side, and 2+ on the left side.  Respiratory: No respiratory distress. He has no wheezes. He has no rhonchi. He has no rales.  GI: Soft. Bowel sounds are normal. There is no tenderness.  Musculoskeletal:       Right ankle: He exhibits no swelling.       Left ankle: He exhibits no swelling.   Lymphadenopathy:    He has no cervical adenopathy.  Neurological: He is alert and oriented to person, place, and time. No cranial nerve deficit.  Skin: Skin is warm. Nails show no clubbing.  Bilateral heels with softening of the skin and erythema  Psychiatric: He has a normal mood and affect.      Data Reviewed: Basic Metabolic Panel: Recent Labs  Lab 09/08/17 0347 09/10/17 2137 09/11/17 0419 09/12/17 0510 09/13/17 0515 09/13/17 1422  NA 139 131* 132* 134* 138  --   K 3.7 4.8 4.3 4.4 5.3* 4.4  CL 112* 103 106 107 111  --   CO2 21* 19* 21* 20* 22  --   GLUCOSE 163* 249* 215* 97 132*  --   BUN 46* 55* 52* 44* 41*  --   CREATININE 2.19* 2.75* 2.66* 2.29* 2.34*  --   CALCIUM 6.9* 6.9* 6.4* 6.7* 6.4*  --   PHOS 2.1*  --   --   --   --   --    Liver Function Tests: Recent Labs  Lab 09/08/17 0347 09/10/17 2137  AST  --  17  ALT  --  39  ALKPHOS  --  70  BILITOT  --  0.9  PROT  --  6.1*  ALBUMIN 2.2* 2.8*    CBC: Recent Labs  Lab 09/07/17 1257  09/10/17 2137 09/11/17 0419 09/11/17 1843 09/12/17 0510 09/13/17 0515  WBC 8.1   < > 6.4 4.4 4.5  4.7 3.6*  NEUTROABS 6.4  --  4.5  --   --   --   --   HGB 8.7*   < > 7.6* 6.8* 7.3* 7.9* 8.1*  HCT 27.7*   < > 23.9* 21.5* 22.7* 24.5* 24.6*  MCV 71.3*   < > 70.6* 70.5* 71.2* 71.4* 71.5*  PLT 26*   < > 111* 107* 124* 141* 162   < > = values in this interval not displayed.   Cardiac Enzymes: Recent Labs  Lab 09/10/17 2137  TROPONINI 0.05*   BNP (last 3 results) Recent Labs    09/04/17 1325 09/10/17 2113  BNP 245.0* 137.0*   CBG: Recent Labs  Lab 09/12/17 1352 09/12/17 1646 09/12/17 2046 09/13/17 0754 09/13/17 1121  GLUCAP 96 108* 211* 119* 190*    Recent Results (from the past 240 hour(s))  Urine Culture     Status: Abnormal   Collection Time: 09/04/17  6:45 PM  Result Value Ref Range Status   Specimen Description   Final    URINE, RANDOM Performed at [facility], [address]  [place], [place] [address]    Special Requests   Final    NONE Performed at [facility], [address] [place], [place] [address]    Culture MULTIPLE SPECIES PRESENT, SUGGEST RECOLLECTION (A)  Final   Report Status 09/06/2017 FINAL  Final  C difficile quick scan w PCR reflex     Status: None   Collection Time: 09/07/17 12:11 PM  Result Value Ref Range Status   C Diff antigen NEGATIVE NEGATIVE Final   C Diff toxin NEGATIVE NEGATIVE Final   C Diff interpretation No C. difficile detected.  Final    Comment: Performed at [facility], [address] [place], [place] [address]  Blood Culture (routine x 2)     Status: None (Preliminary result)   Collection Time: 09/10/17  9:36 PM  Result Value Ref Range Status   Specimen Description BLOOD LT [facility]  Final   Special Requests   Final    BOTTLES DRAWN AEROBIC AND ANAEROBIC Blood Culture adequate volume   Culture   Final    NO GROWTH 3 DAYS Performed at [facility], [address], [place], [place] [address]    Report Status PENDING  Incomplete  Blood Culture (routine x 2)     Status: None (Preliminary result)   Collection Time: 09/10/17  9:36 PM  Result Value Ref Range Status   Specimen Description BLOOD RT [facility]  Final   Special Requests   Final    BOTTLES DRAWN AEROBIC AND ANAEROBIC Blood Culture adequate volume   Culture   Final    NO GROWTH 3 DAYS Performed at [facility], [address] [place], [place] [address]    Report Status PENDING  Incomplete  Urine culture     Status: Abnormal   Collection Time: 09/11/17  5:42 AM  Result Value Ref Range Status   Specimen Description   Final    URINE, RANDOM Performed at [facility], [address], [place], [place] [address]    Special Requests   Final    NONE Performed at [facility], [address] [place], [place] [address]    Culture >=100,000 COLONIES/mL ENTEROCOCCUS FAECALIS (A)  Final   Report Status 09/13/2017 FINAL   Final   Organism ID, Bacteria ENTEROCOCCUS FAECALIS (A)  Final      Susceptibility   Enterococcus faecalis - MIC*  AMPICILLIN <=2 SENSITIVE Sensitive     LEVOFLOXACIN 1 SENSITIVE Sensitive     NITROFURANTOIN <=16 SENSITIVE Sensitive     VANCOMYCIN 1 SENSITIVE Sensitive     * >=100,000 COLONIES/mL ENTEROCOCCUS FAECALIS  MRSA PCR Screening     Status: None   Collection Time: 09/11/17  8:24 PM  Result Value Ref Range Status   MRSA by PCR NEGATIVE NEGATIVE Final    Comment:        The GeneXpert MRSA Assay (FDA approved for NASAL specimens only), is one component of a comprehensive MRSA colonization surveillance program. It is not intended to diagnose MRSA infection nor to guide or monitor treatment for MRSA infections. Performed at [facility], [address] [place], [place] [address]      Studies: Ir Kypho Lumbar Inc Fx Reduce Bone Bx Uni/bil Cannulation Inc/imaging  Result Date: 09/12/2017 CLINICAL DATA:  History of multiple myeloma, now with symptomatic malignant L5 compression fracture. Patient with history of AICD/pacemaker and as such, presents today for fluoroscopic guided L5 vertebral body biopsy and cement augmentation. EXAM: FLUOROSCOPIC GUIDED KYPHOPLASTY OF THE L5 VERTEBRAL BODY COMPARISON:  Lumbar spine CT - 08/28/2017; PET-CT - 08/25/2017 MEDICATIONS: As antibiotic prophylaxis, Ancef 2 gm IV was ordered pre-procedure and administered intravenously within 1 hour of incision. ANESTHESIA/SEDATION: Moderate (conscious) sedation was employed during this procedure. A total of Versed 4 mg and Fentanyl 200 mcg was administered intravenously. Moderate Sedation Time: 50 minutes. The patient's level of consciousness and vital signs were monitored continuously by radiology nursing throughout the procedure under my direct supervision. FLUOROSCOPY TIME:  11 Min, 54 seconds (8453 mGy) COMPLICATIONS: None immediate. TECHNIQUE: The procedure, risks (including but not limited to  bleeding, infection, organ damage), benefits, and alternatives were explained to the patient. Questions regarding the procedure were encouraged and answered. The patient understands and consents to the procedure. The patient was placed prone on the fluoroscopic table. The skin overlying the lower lumbar region was then prepped and draped in the usual sterile fashion. Maximal barrier sterile technique was utilized including caps, mask, sterile gowns, sterile gloves, sterile drape, hand hygiene and skin antiseptic. Intravenous Fentanyl and Versed were administered as conscious sedation during continuous cardiorespiratory monitoring by the radiology RN. The left pedicle at L5 was then infiltrated with 1% lidocaine followed by the advancement of a Kyphon trocar needle through the left pedicle into the posterior one-third of the vertebral body. The trocar was removed and a bone biopsy was obtained at this location. Subsequently, the osteo drill was advanced to the anterior third of the vertebral body. The osteo drill was retracted. Through the working cannula, a Kyphon inflatable bone tamp 15 x 3 was advanced and positioned with the distal marker approximately 5 mm from the anterior aspect of the cortex. Appropriate positioning was confirmed on the AP projection. At this time, the balloon was expanded using contrast via a Kyphon inflation syringe device via micro tubing. In similar fashion, the right L5 pedicle was infiltrated with 1% lidocaine followed by the advancement of a second Kyphon trocar needle through the right pedicle into the posterior third of the vertebral body. Again, a bone biopsy was obtained at this location. Subsequently, the osteo drill was coaxially advanced to the anterior right third. The osteo drill was exchanged for a Kyphon inflatable bone tamp 15 x 3, advanced to the 5 mm of the anterior aspect of the cortex. The balloon was then expanded using contrast as above. Inflations were continued  until there was  near apposition with the superior end plate. At this time, methylmethacrylate mixture was reconstituted in the Kyphon bone mixing device system. This was then loaded into the delivery mechanism, attached to Kyphon bone fillers. The balloons were deflated and removed followed by the instillation of methylmethacrylate mixture with excellent filling in the AP and lateral projections. No extravasation was noted in the disk spaces or posteriorly into the spinal canal. No epidural venous contamination was seen. The working cannulae and the bone filler were then retrieved and removed. Hemostasis was achieved with manual compression. The patient tolerated the procedure well without immediate postprocedural complication. IMPRESSION: 1. Technically successful L5 vertebral body augmentation using balloon kyphoplasty. 2. Technically successful L5 vertebral body biopsy. Electronically Signed   By: Sandi Mariscal M.D.   On: 09/12/2017 14:27    Scheduled Meds: . acyclovir  400 mg Oral Daily  . amoxicillin-clavulanate  1 tablet Oral Q12H  . atorvastatin  40 mg Oral QHS  . calcium-vitamin D  1 tablet Oral TID WC  . carvedilol  25 mg Oral BID WC  . docusate sodium  100 mg Oral BID  . fentaNYL  12.5 mcg Transdermal Q72H  . furosemide  40 mg Oral Daily  . insulin aspart  0-5 Units Subcutaneous QHS  . insulin aspart  0-9 Units Subcutaneous TID WC  . tamsulosin  0.4 mg Oral Daily  . vitamin B-12  1,000 mcg Oral Daily  . vitamin C  500 mg Oral Daily   Continuous Infusions:   Assessment/Plan:  1. Lumbar spine compression fracture.  Interventional radiology did a bone biopsy and kyphoplasty  yesterday.  Physical therapy recommended rehab. 2. Advanced multiple myeloma.  Follow-up with oncology as outpatient. 3. Acute on chronic anemia.  Transfused 1 unit of packed red blood cells with good response.  Today's hemoglobin is 8.1. 4. Thrombocytopenia secondary to multiple myeloma.  This seems better than  last admission. 5. Acute kidney failure on chronic kidney disease stage III.  Secondary to multiple myeloma.  Monitor closely.  Creatinine 2.34 today. 6. Hyperkalemia.  Potassium 5.3.  Given Kayexalate and repeat potassium 4.4. 7. Acute cystitis.  Urine culture growing enterococcus.  Antibiotics changed to Augmentin. 8. Type 2 diabetes mellitus.  On sliding scale insulin.   restart low-dose 7030 insulin 9. hyperlipidemia unspecified on atorvastatin 10. BPH on Flomax 11. Heel pressure ulcers.  Heel protectors while in bed.  Code Status:     Code Status Orders  (From admission, onward)        Start     Ordered   09/11/17 0309  Full code  Continuous     09/11/17 0308    Code Status History    Date Active Date Inactive Code Status Order ID Comments User Context   09/04/2017 17:55 09/08/2017 18:54 Full Code [id number]  [name], MD ED    Advance Directive Documentation     Most Recent Value  Type of Advance Directive  Healthcare Power of Attorney  Pre-existing out of facility DNR order (yellow form or pink MOST form)  No data  "MOST" Form in Place?  No data     Family Communication: Family yesterday Disposition Plan: Physical therapy recommends rehab  Antibiotics:  Augmentin  Time spent: 24 minutes  [place]

## 2017-09-13 NOTE — Clinical Social Work Note (Signed)
CSW is aware via chart review that PT is recommending SNF for this patient. CSW will assess when able.  Santiago Bumpers, MSW, Latanya Presser [phone]

## 2017-09-14 LAB — BASIC METABOLIC PANEL
Anion gap: 8 (ref 5–15)
BUN: 40 mg/dL — AB (ref 6–20)
CHLORIDE: 109 mmol/L (ref 101–111)
CO2: 21 mmol/L — ABNORMAL LOW (ref 22–32)
Calcium: 6.3 mg/dL — CL (ref 8.9–10.3)
Creatinine, Ser: 2.18 mg/dL — ABNORMAL HIGH (ref 0.61–1.24)
GFR calc Af Amer: 33 mL/min — ABNORMAL LOW (ref >60)
GFR calc non Af Amer: 28 mL/min — ABNORMAL LOW (ref >60)
Glucose, Bld: 187 mg/dL — ABNORMAL HIGH (ref 65–99)
POTASSIUM: 3.8 mmol/L (ref 3.5–5.1)
SODIUM: 138 mmol/L (ref 135–145)

## 2017-09-14 LAB — GLUCOSE, CAPILLARY
GLUCOSE-CAPILLARY: 160 mg/dL — AB (ref 65–99)
Glucose-Capillary: 211 mg/dL — ABNORMAL HIGH (ref 65–99)
Glucose-Capillary: 205 mg/dL — ABNORMAL HIGH (ref 65–99)
Glucose-Capillary: 144 mg/dL — ABNORMAL HIGH (ref 65–99)

## 2017-09-14 MED ORDER — INSULIN ASPART PROT & ASPART (70-30 MIX) 100 UNIT/ML ~~LOC~~ SUSP
8.0000 [IU] | Freq: Two times a day (BID) | SUBCUTANEOUS | Status: DC
  Administered 2017-09-14 – 2017-09-16 (×5): 8 [IU] via SUBCUTANEOUS
  Filled 2017-09-14 (×5): qty 1

## 2017-09-14 NOTE — Progress Notes (Signed)
Physical Therapy Treatment Patient Details Name: Adam Wall MRN: [id number] DOB: 1945/11/08 Today's Date: 09/14/2017    History of Present Illness Claude Waldman is a 72yo black male who comes to [facility] on 09/10/17 c uncontrolled back pain. He was schedule for L5 kyphoplasty, and had been largely bed bound at home for the past several days. PMH: multiple myeolma, CHF, DM2, HTN, anemia, CKD, AICD. PT is s/p 1uPRBC, now Hb: 8.1, HCT: 24.6. Pt is now s/p L5 kyphoplasty (1/18) and with bilat heel pressure ulcers.     PT Comments    Patient is progressing slowly; He requires increased cues and time to complete bed mobility due to soreness in low back; Patient able to exhibit better supin<>sitting with log roll technique for less back discomfort; He requires increased cues for forward weight shift and to increase push through LE for better transfer ability; patient continues to require multiple attempts before standing successfully with mod A; Instructed patient in advanced LE strengthening; He exhibits increased weakness in LE requiring increased time to complete exercise; Patient would benefit from additional skilled PT intervention to improve strength, balance and gait safety for return to PLOF.     Follow Up Recommendations  SNF;Supervision for mobility/OOB     Equipment Recommendations  None recommended by PT    Recommendations for Other Services       Precautions / Restrictions Precautions Precautions: Fall;Back Precaution Booklet Issued: No Precaution Comments: educated on log rolling at eval Restrictions Weight Bearing Restrictions: No    Mobility  Bed Mobility Overal bed mobility: Needs Assistance Bed Mobility: Supine to Sit;Rolling Rolling: Max assist   Supine to sit: Max assist;Mod assist     General bed mobility comments: patient attempted supine<>sitting without log roll reporting increased back pain; He required max A for rolling to right with cues for hand  placement and to use LLE to push through bed for better rolling; Patient then required mod A for sidelying to sitting with cues for breathing during push through UE for better safety and mobility; x1 rep; reports increased back pain during bed mobility;   Transfers Overall transfer level: Needs assistance Equipment used: Rolling walker (2 wheeled) Transfers: Sit to/from Stand Sit to Stand: Mod assist         General transfer comment: multiple attempts required, requires modA and cues for forward weight shift and to push through LE; often patient tries to pull up with UE which increases back pain; required cues for hand placement and increased participation with LEs;   Ambulation/Gait Ambulation/Gait assistance: Min guard Ambulation Distance (Feet): 28 Feet Assistive device: Rolling walker (2 wheeled) Gait Pattern/deviations: Trunk flexed;Decreased step length - right;Decreased step length - left;Decreased dorsiflexion - right;Decreased dorsiflexion - left Gait velocity: decreased   General Gait Details: 3-point gait, antalgic, slower gait speed;    Stairs            Wheelchair Mobility    Modified Rankin (Stroke Patients Only)       Balance Overall balance assessment: Needs assistance Sitting-balance support: Bilateral upper extremity supported;Feet supported Sitting balance-Leahy Scale: Fair     Standing balance support: During functional activity;Bilateral upper extremity supported Standing balance-Leahy Scale: Poor                              Cognition Arousal/Alertness: Awake/alert Behavior During Therapy: WFL for tasks assessed/performed Overall Cognitive Status: Within Functional Limits for tasks assessed  Exercises General Exercises - Lower Extremity Ankle Circles/Pumps: AROM;15 reps;Supine;Both Short Arc Quad: AROM;Supine;10 reps;Both Hip Flexion/Marching: AROM;10  reps;Both;Seated Other Exercises Other Exercises: Instructed patient in supine, sitting LE strengthening exercise; required cues to increase ROM for better strengthening/mobility;  Other Exercises: Instructed patient in posterior shoulder rolls to help improve postural support and reduce tightness around neck x10 reps;     General Comments        Pertinent Vitals/Pain Pain Assessment: 0-10 Pain Score: 7  Pain Location: back and BLE heels (pressure ulcers) Pain Descriptors / Indicators: Aching;Sore Pain Intervention(s): Limited activity within patient's tolerance;Repositioned;Monitored during session    Home Living                      Prior Function            PT Goals (current goals can now be found in the care plan section) Acute Rehab PT Goals Patient Stated Goal: regain strength and independent mobility PT Goal Formulation: With patient Time For Goal Achievement: 09/20/17 Potential to Achieve Goals: Good Progress towards PT goals: Progressing toward goals    Frequency    7X/week      PT Plan Current plan remains appropriate    Co-evaluation              AM-PAC PT "6 Clicks" Daily Activity  Outcome Measure  Difficulty turning over in bed (including adjusting bedclothes, sheets and blankets)?: Unable Difficulty moving from lying on back to sitting on the side of the bed? : Unable Difficulty sitting down on and standing up from a chair with arms (e.g., wheelchair, bedside commode, etc,.)?: Unable Help needed moving to and from a bed to chair (including a wheelchair)?: A Lot Help needed walking in hospital room?: A Little Help needed climbing 3-5 steps with a railing? : Total 6 Click Score: 9    End of Session Equipment Utilized During Treatment: Gait belt Activity Tolerance: Patient limited by fatigue;Patient limited by pain Patient left: in chair;with call bell/phone within reach;with chair alarm set Nurse Communication: Mobility status PT  Visit Diagnosis: Unsteadiness on feet (R26.81);Other abnormalities of gait and mobility (R26.89);Difficulty in walking, not elsewhere classified (R26.2)     Time: 1610-9604 PT Time Calculation (min) (ACUTE ONLY): 38 min  Charges:  $Gait Training: 8-22 mins $Therapeutic Exercise: 8-22 mins $Therapeutic Activity: 8-22 mins                    G Codes:          Vonna Brabson PT, DPT 09/14/2017, 12:05 PM

## 2017-09-14 NOTE — Progress Notes (Signed)
Patient ID: Adam Wall, male   DOB: June 30, 1946, 72 y.o.   MRN: [id number]   [facility] PROGRESS NOTE  [name] JKD:[id number] DOB: 1946/06/06 DOA: 09/10/2017 PCP: [name], MD  HPI/Subjective: Patient seen this morning prior to kyphoplasty.  Was still having back pain at that time.  Objective: Vitals:   09/13/17 2043 09/14/17 0354  BP: (!) 153/53 (!) 128/52  Pulse: 72 76  Resp: 16   Temp: 97.8 F (36.6 C) 97.9 F (36.6 C)  SpO2: 99% 98%    Filed Weights   09/12/17 1032 09/13/17 0452 09/14/17 0353  Weight: 96.2 kg (212 lb) 94.8 kg (209 lb) 94.3 kg (208 lb)    ROS: Review of Systems  Constitutional: Negative for chills and fever.  Eyes: Negative for blurred vision.  Respiratory: Negative for cough and shortness of breath.   Cardiovascular: Negative for chest pain.  Gastrointestinal: Negative for abdominal pain, constipation, diarrhea, nausea and vomiting.  Genitourinary: Negative for dysuria.  Musculoskeletal: Positive for back pain. Negative for joint pain.  Neurological: Negative for dizziness and headaches.   Exam: Physical Exam  Constitutional: He is oriented to person, place, and time.  HENT:  Nose: No mucosal edema.  Mouth/Throat: No oropharyngeal exudate or posterior oropharyngeal edema.  Eyes: Conjunctivae, EOM and lids are normal. Pupils are equal, round, and reactive to light.  Neck: No JVD present. Carotid bruit is not present. No edema present. No thyroid mass and no thyromegaly present.  Cardiovascular: S1 normal and S2 normal. Exam reveals no gallop.  No murmur heard. Pulses:      Dorsalis pedis pulses are 2+ on the right side, and 2+ on the left side.  Respiratory: No respiratory distress. He has no wheezes. He has no rhonchi. He has no rales.  GI: Soft. Bowel sounds are normal. There is no tenderness.  Musculoskeletal:       Right ankle: He exhibits no swelling.       Left ankle: He exhibits no swelling.   Lymphadenopathy:    He has no cervical adenopathy.  Neurological: He is alert and oriented to person, place, and time. No cranial nerve deficit.  Skin: Skin is warm. No rash noted. Nails show no clubbing.  Psychiatric: He has a normal mood and affect.      Data Reviewed: Basic Metabolic Panel: Recent Labs  Lab 09/08/17 0347 09/10/17 2137 09/11/17 0419 09/12/17 0510 09/13/17 0515 09/13/17 1422 09/14/17 0513  NA 139 131* 132* 134* 138  --  138  K 3.7 4.8 4.3 4.4 5.3* 4.4 3.8  CL 112* 103 106 107 111  --  109  CO2 21* 19* 21* 20* 22  --  21*  GLUCOSE 163* 249* 215* 97 132*  --  187*  BUN 46* 55* 52* 44* 41*  --  40*  CREATININE 2.19* 2.75* 2.66* 2.29* 2.34*  --  2.18*  CALCIUM 6.9* 6.9* 6.4* 6.7* 6.4*  --  6.3*  PHOS 2.1*  --   --   --   --   --   --    Liver Function Tests: Recent Labs  Lab 09/08/17 0347 09/10/17 2137  AST  --  17  ALT  --  39  ALKPHOS  --  70  BILITOT  --  0.9  PROT  --  6.1*  ALBUMIN 2.2* 2.8*    CBC: Recent Labs  Lab 09/10/17 2137 09/11/17 0419 09/11/17 1843 09/12/17 0510 09/13/17 0515  WBC 6.4 4.4 4.5 4.7 3.6*  NEUTROABS  4.5  --   --   --   --   HGB 7.6* 6.8* 7.3* 7.9* 8.1*  HCT 23.9* 21.5* 22.7* 24.5* 24.6*  MCV 70.6* 70.5* 71.2* 71.4* 71.5*  PLT 111* 107* 124* 141* 162   Cardiac Enzymes: Recent Labs  Lab 09/10/17 2137  TROPONINI 0.05*   BNP (last 3 results) Recent Labs    09/04/17 1325 09/10/17 2113  BNP 245.0* 137.0*   CBG: Recent Labs  Lab 09/13/17 1121 09/13/17 1650 09/13/17 2045 09/14/17 0729 09/14/17 1131  GLUCAP 190* 216* 222* 160* 211*    Recent Results (from the past 240 hour(s))  Urine Culture     Status: Abnormal   Collection Time: 09/04/17  6:45 PM  Result Value Ref Range Status   Specimen Description   Final    URINE, RANDOM Performed at [facility], [address] [place], [place] [address]    Special Requests   Final    NONE Performed at [facility], [address] [place], [place] [address]    Culture MULTIPLE SPECIES PRESENT, SUGGEST RECOLLECTION (A)  Final   Report Status 09/06/2017 FINAL  Final  C difficile quick scan w PCR reflex     Status: None   Collection Time: 09/07/17 12:11 PM  Result Value Ref Range Status   C Diff antigen NEGATIVE NEGATIVE Final   C Diff toxin NEGATIVE NEGATIVE Final   C Diff interpretation No C. difficile detected.  Final    Comment: Performed at [facility], [address] [place], [place] [address]  Blood Culture (routine x 2)     Status: None (Preliminary result)   Collection Time: 09/10/17  9:36 PM  Result Value Ref Range Status   Specimen Description BLOOD LT [facility]  Final   Special Requests   Final    BOTTLES DRAWN AEROBIC AND ANAEROBIC Blood Culture adequate volume   Culture   Final    NO GROWTH 4 DAYS Performed at [facility], [address] [place], [place] [address]    Report Status PENDING  Incomplete  Blood Culture (routine x 2)     Status: None (Preliminary result)   Collection Time: 09/10/17  9:36 PM  Result Value Ref Range Status   Specimen Description BLOOD RT [facility]  Final   Special Requests   Final    BOTTLES DRAWN AEROBIC AND ANAEROBIC Blood Culture adequate volume   Culture   Final    NO GROWTH 4 DAYS Performed at [facility], [address] [place], [place] [address]    Report Status PENDING  Incomplete  Urine culture     Status: Abnormal   Collection Time: 09/11/17  5:42 AM  Result Value Ref Range Status   Specimen Description   Final    URINE, RANDOM Performed at [facility], [address] [place], [place] [address]    Special Requests   Final    NONE Performed at [facility], [address] [place], [place] [address]    Culture >=100,000 COLONIES/mL ENTEROCOCCUS FAECALIS (A)  Final   Report Status 09/13/2017 FINAL  Final   Organism ID, Bacteria ENTEROCOCCUS FAECALIS (A)  Final      Susceptibility   Enterococcus faecalis -  MIC*    AMPICILLIN <=2 SENSITIVE Sensitive     LEVOFLOXACIN 1 SENSITIVE Sensitive     NITROFURANTOIN <=16 SENSITIVE Sensitive     VANCOMYCIN 1 SENSITIVE Sensitive     * >=100,000 COLONIES/mL ENTEROCOCCUS FAECALIS  MRSA PCR Screening     Status: None   Collection Time: 09/11/17  8:24 PM  Result Value Ref Range Status   MRSA by PCR NEGATIVE NEGATIVE Final    Comment:        The GeneXpert MRSA Assay (FDA approved for NASAL specimens only), is one component of a comprehensive MRSA colonization surveillance program. It is not intended to diagnose MRSA infection nor to guide or monitor treatment for MRSA infections. Performed at [facility], [address], [place], [place] [address]      Studies: No results found.  Scheduled Meds: . acyclovir  400 mg Oral Daily  . amoxicillin-clavulanate  1 tablet Oral Q12H  . atorvastatin  40 mg Oral QHS  . calcium-vitamin D  1 tablet Oral TID WC  . carvedilol  25 mg Oral BID WC  . docusate sodium  100 mg Oral BID  . fentaNYL  12.5 mcg Transdermal Q72H  . furosemide  40 mg Oral Daily  . insulin aspart  0-5 Units Subcutaneous QHS  . insulin aspart  0-9 Units Subcutaneous TID WC  . insulin aspart protamine- aspart  8 Units Subcutaneous BID WC  . tamsulosin  0.4 mg Oral Daily  . vitamin B-12  1,000 mcg Oral Daily  . vitamin C  500 mg Oral Daily    Assessment/Plan:  1. Lumbar spine compression fracture.  Interventional radiology did a bone biopsy and kyphoplasty.   still with very weak with physical therapy. 2. Advanced multiple myeloma.  Follow-up with oncology as outpatient. 3. Acute on chronic anemia.  Transfused 1 unit of packed red blood cells with good response.   yesterday's hemoglobin 8.1 4. Thrombocytopenia secondary to multiple myeloma.  This seems better than last admission. 5. Acute kidney failure on chronic kidney disease stage III.  Secondary to multiple myeloma.  Creatinine has improved to 2.18.  Continue Lasix  today. 6. Acute cystitis.  Urine culture growing enterococcus.  Antibiotics changed to Augmentin. 7. Type 2 diabetes mellitus.  On sliding scale insulin.  Restart 70/30 insulin at lower dose 8. hyperlipidemia unspecified on atorvastatin 9. BPH on Flomax  Code Status:     Code Status Orders  (From admission, onward)        Start     Ordered   09/11/17 0309  Full code  Continuous     09/11/17 0308    Code Status History    Date Active Date Inactive Code Status Order ID Comments User Context   09/04/2017 17:55 09/08/2017 18:54 Full Code [id number]  [name], MD ED    Advance Directive Documentation     Most Recent Value  Type of Advance Directive  Healthcare Power of Attorney  Pre-existing out of facility DNR order (yellow form or pink MOST form)  No data  "MOST" Form in Place?  No data     Family Communication: Left message for wife on the phone Disposition Plan: Physical therapy recommended rehab  Antibiotics:  Augmentin  Time spent: 26 minutes  [place]

## 2017-09-14 NOTE — Plan of Care (Signed)
  Progressing Education: Knowledge of General Education information will improve 09/14/2017 0413 - Progressing by Denice Bors, RN Activity: Risk for activity intolerance will decrease 09/14/2017 0413 - Progressing by Denice Bors, RN Pain Managment: General experience of comfort will improve 09/14/2017 0413 - Progressing by Denice Bors, RN Safety: Ability to remain free from injury will improve 09/14/2017 0413 - Progressing by Denice Bors, RN   Not Progressing Skin Integrity: Risk for impaired skin integrity will decrease 09/14/2017 0413 - Not Progressing by Denice Bors, RN Ordered low air loss bed , pt less willing to turn side to side to offload sacrum.

## 2017-09-14 NOTE — Progress Notes (Signed)
Pt very difficult to move pt side to side as he is very stiff and has constant report of pain with movement. Pt has deep tissue injuries to bilateral heels and MASD, rash and redness to buttocks.  Pressure relief boots and foam heel dressings to bilateral heels on. Foam to sacrum is new and intact. Barrier cream and incontinence cleanser used after each bowel movement.  Ordered low air loss mattress for pt as he is reluctant to roll side to side and does not like off loading with pillow. Sizewise confirmation [id number]. Dorna Bloom RN

## 2017-09-14 NOTE — Progress Notes (Addendum)
CRITICAL VALUE STICKER  CRITICAL VALUE: Calcium 6.3  RECEIVER (on-site recipient of call):Melvina Pangelinan [place] NOTIFIED: 09/14/17, 0301  MESSENGER (representative from lab):  MD NOTIFIED:Dr Pyreddy  TIME OF NOTIFICATION: 3143  RESPONSE: Continue to watch pt.

## 2017-09-15 ENCOUNTER — Other Ambulatory Visit: Payer: Self-pay | Admitting: *Deleted

## 2017-09-15 ENCOUNTER — Ambulatory Visit
Admission: RE | Admit: 2017-09-15 | Discharge: 2017-09-15 | Disposition: A | Discharge status: Home / Self Care | Payer: Medicare Other | Source: Ambulatory Visit | Attending: Radiation Oncology | Admitting: Radiation Oncology

## 2017-09-15 ENCOUNTER — Telehealth: Payer: Self-pay | Admitting: *Deleted

## 2017-09-15 ENCOUNTER — Ambulatory Visit: Payer: Medicare Other

## 2017-09-15 ENCOUNTER — Inpatient Hospital Stay: Payer: Medicare Other

## 2017-09-15 ENCOUNTER — Ambulatory Visit: Payer: Medicare Other | Admitting: Oncology

## 2017-09-15 LAB — CULTURE, BLOOD (ROUTINE X 2)
CULTURE: NO GROWTH
Culture: NO GROWTH
Special Requests: ADEQUATE
Special Requests: ADEQUATE

## 2017-09-15 LAB — BASIC METABOLIC PANEL
Anion gap: 8 (ref 5–15)
BUN: 31 mg/dL — AB (ref 6–20)
CALCIUM: 6.4 mg/dL — AB (ref 8.9–10.3)
CO2: 22 mmol/L (ref 22–32)
CREATININE: 1.99 mg/dL — AB (ref 0.61–1.24)
Chloride: 109 mmol/L (ref 101–111)
GFR calc Af Amer: 37 mL/min — ABNORMAL LOW (ref >60)
GFR, EST NON AFRICAN AMERICAN: 32 mL/min — AB (ref >60)
GLUCOSE: 101 mg/dL — AB (ref 65–99)
Potassium: 4 mmol/L (ref 3.5–5.1)
SODIUM: 139 mmol/L (ref 135–145)

## 2017-09-15 LAB — GLUCOSE, CAPILLARY
GLUCOSE-CAPILLARY: 130 mg/dL — AB (ref 65–99)
GLUCOSE-CAPILLARY: 140 mg/dL — AB (ref 65–99)
Glucose-Capillary: 106 mg/dL — ABNORMAL HIGH (ref 65–99)
Glucose-Capillary: 131 mg/dL — ABNORMAL HIGH (ref 65–99)

## 2017-09-15 LAB — PREPARE RBC (CROSSMATCH)

## 2017-09-15 LAB — HEMOGLOBIN: HEMOGLOBIN: 7.2 g/dL — AB (ref 13.0–18.0)

## 2017-09-15 MED ORDER — SODIUM CHLORIDE 0.9 % IV SOLN
Freq: Once | INTRAVENOUS | Status: AC
  Administered 2017-09-15: 15:00:00 via INTRAVENOUS

## 2017-09-15 MED ORDER — CALCIUM CARBONATE ANTACID 500 MG PO CHEW
1000.0000 mg | CHEWABLE_TABLET | ORAL | Status: AC
  Administered 2017-09-15 (×2): 1000 mg via ORAL
  Filled 2017-09-15 (×2): qty 5

## 2017-09-15 NOTE — Clinical Social Work Placement (Addendum)
   CLINICAL SOCIAL WORK PLACEMENT  NOTE  Date:  09/15/2017  Patient Details  Name: Adam Wall MRN: [id number] Date of Birth: 1945/09/18  Clinical Social Work is seeking post-discharge placement for this patient at the [place] level of care (*CSW will initial, date and re-position this form in  chart as items are completed):  Yes   Patient/family provided with [facility] Work Department's list of facilities offering this level of care within the geographic area requested by the patient (or if unable, by the patient's family).  Yes   Patient/family informed of their freedom to choose among providers that offer the needed level of care, that participate in Medicare, Medicaid or managed care program needed by the patient, have an available bed and are willing to accept the patient.  Yes   Patient/family informed of [facility]'s ownership interest in [facility] and [facility], as well as of the fact that they are under no obligation to receive care at these facilities.  PASRR submitted to EDS on 09/13/17     PASRR number received on 09/13/17     Existing PASRR number confirmed on       FL2 transmitted to all facilities in geographic area requested by pt/family on 09/13/17     FL2 transmitted to all facilities within larger geographic area on       Patient informed that his/her managed care company has contracts with or will negotiate with certain facilities, including the following:        Yes   Patient/family informed of bed offers received.  Patient chooses bed at [facility] )     Physician recommends and patient chooses bed at      Patient to be transferred to  [facility] on 09-16-17.  Patient to be transferred to facility by [facility]       Patient family notified on  09-16-17 of transfer.  Name of family member notified:   Patient's wife Tykee Heideman, [phone] who was at bedside.      PHYSICIAN       Additional Comment:    _______________________________________________ Sample, [place] Beets, LCSW 09/15/2017, 1:48 PM   Updated 09-16-17 Jones Broom. Norval Morton, MSW, [place]  09/16/2017 3:39 PM

## 2017-09-15 NOTE — Telephone Encounter (Signed)
I called the wife and she states that the social worker that spoke to her said that she was looking at [facility]. I asked if social worker and nursing facility that he will need to come daily with radiation and he will ger chemo once a week. She told Education officer, museum and they told her that she thinks [facility] would be ok if wife would bring him to appts.  I asked wife if he can get up and walk and she has not seen him up yet.  I told her that [facility] does not have van transportation but they use [facility] [facility] and it is a charge for each trip. Wife agreeable to use the service if needed. I told her to check with social worker and make sure that I am correct. She will

## 2017-09-15 NOTE — Progress Notes (Signed)
Physical Therapy Treatment Patient Details Name: Adam Wall MRN: [id number] DOB: 08/29/1945 Today's Date: 09/15/2017    History of Present Illness Adam Wall is a 72yo black male who comes to [facility] on 09/10/17 c uncontrolled back pain. He was schedule for L5 kyphoplasty, and had been largely bed bound at home for the past several days. PMH: multiple myeolma, CHF, DM2, HTN, anemia, CKD, AICD. PT is s/p 1uPRBC, now Hb: 8.1, HCT: 24.6. Pt is now s/p L5 kyphoplasty (1/18) and with bilat heel pressure ulcers.     PT Comments    Pt agreeable to PT; reports 8/10 pain in back. Pt requires heavy assist and cueing for sequence/technieque for bed mobility. Encouraged more fluid, continuous movement as able for greater ease at gaining balance versus being "stuck" mid way. Pt notes less pain with bed mobility today. STS transfers require bed elevation to allow stand. Pt requires 2 attempts and utilizes rocking motion to elevate with Mod A. Pt noted to have forward flexed posture with difficulty attaining upright posture for short ambulation to [facility]. Pt requires time on [facility]; therefore, nursing staff notified and will attend to pt when pt is ready. Continue PT to progress strength, endurance, balance and safety to improve all functional mobility.    Follow Up Recommendations  SNF     Equipment Recommendations  None recommended by PT    Recommendations for Other Services       Precautions / Restrictions Precautions Precautions: Fall;Back Restrictions Weight Bearing Restrictions: No    Mobility  Bed Mobility Overal bed mobility: Needs Assistance Bed Mobility: Supine to Sit;Rolling Rolling: Min assist   Supine to sit: Mod assist     General bed mobility comments: Effortful; assist to mobilize BLEs off edge of bed and more so for trunk.   Transfers Overall transfer level: Needs assistance Equipment used: Rolling walker (2 wheeled) Transfers: Sit to/from Stand Sit to Stand: Mod  assist;From elevated surface         General transfer comment: Two attemps; second attempt with bed elevated. Cues and assist for hands placed to rw. Effortful  Ambulation/Gait Ambulation/Gait assistance: Min guard;Min assist Ambulation Distance (Feet): 8 Feet Assistive device: Rolling walker (2 wheeled) Gait Pattern/deviations: Trunk flexed;Step-to pattern     General Gait Details: small choppy steps; heavy lean on rw   Stairs            Wheelchair Mobility    Modified Rankin (Stroke Patients Only)       Balance Overall balance assessment: Needs assistance Sitting-balance support: Bilateral upper extremity supported;Feet supported Sitting balance-Leahy Scale: Fair     Standing balance support: Bilateral upper extremity supported Standing balance-Leahy Scale: Poor                              Cognition Arousal/Alertness: Awake/alert(tired) Behavior During Therapy: WFL for tasks assessed/performed Overall Cognitive Status: Within Functional Limits for tasks assessed                                        Exercises General Exercises - Lower Extremity Ankle Circles/Pumps: AROM;Both;20 reps;Supine Quad Sets: Strengthening;Both;10 reps;Supine Gluteal Sets: Strengthening;Both;10 reps;Supine Short Arc Quad: AROM;Both;10 reps;Supine Heel Slides: AAROM;Both;10 reps;Supine Hip ABduction/ADduction: AAROM;Both;10 reps;Supine    General Comments        Pertinent Vitals/Pain Pain Assessment: 0-10 Pain Score: 8  Pain Location:  Back Pain Descriptors / Indicators: Constant;Sore Pain Intervention(s): Premedicated before session;Monitored during session    Home Living                      Prior Function            PT Goals (current goals can now be found in the care plan section) Progress towards PT goals: Progressing toward goals(slowly)    Frequency    7X/week      PT Plan Current plan remains appropriate     Co-evaluation              AM-PAC PT "6 Clicks" Daily Activity  Outcome Measure  Difficulty turning over in bed (including adjusting bedclothes, sheets and blankets)?: Unable Difficulty moving from lying on back to sitting on the side of the bed? : Unable Difficulty sitting down on and standing up from a chair with arms (e.g., wheelchair, bedside commode, etc,.)?: Unable Help needed moving to and from a bed to chair (including a wheelchair)?: A Lot Help needed walking in hospital room?: A Lot Help needed climbing 3-5 steps with a railing? : Total 6 Click Score: 8    End of Session Equipment Utilized During Treatment: Gait belt Activity Tolerance: Patient limited by pain;Patient limited by fatigue Patient left: Other (comment)(on bedside commode with phone/bell; nsg staff aware) Nurse Communication: Other (comment)(pt on BSC with phone/bell; needs time ) PT Visit Diagnosis: Unsteadiness on feet (R26.81);Other abnormalities of gait and mobility (R26.89);Difficulty in walking, not elsewhere classified (R26.2)     Time: 9735-3299 PT Time Calculation (min) (ACUTE ONLY): 38 min  Charges:  $Gait Training: 8-22 mins $Therapeutic Exercise: 8-22 mins $Therapeutic Activity: 8-22 mins                    G Codes:       Larae Grooms, PTA 09/15/2017, 11:17 AM

## 2017-09-15 NOTE — Progress Notes (Signed)
CRITICAL VALUE ALERT  Critical Value:  Calcium 6.4  Date & Time Notied:  09/15/17 @ 9211  Provider Notified: Dr Estanislado Pandy  Orders Received/Actions taken: No new orders at this time

## 2017-09-15 NOTE — Progress Notes (Signed)
Referring Physician(s): Randa Evens  Supervising Physician: Marybelle Killings  Patient Status:  [facility] - In-pt  Chief Complaint:  Pathologic compression fracture of L5  Subjective:  Mr. Haliburton tells me the pain in his lower back is much better. He says prior to the kyphoplasty his pain was 8-10/10 and now it is about a 4.  Allergies: Patient has no known allergies.  Medications: Prior to Admission medications   Medication Sig Start Date End Date Taking? Authorizing Provider  acetaminophen (TYLENOL) 500 MG tablet Take 1,000 mg by mouth every 8 (eight) hours as needed for mild pain or moderate pain.   Yes [provider]  acyclovir (ZOVIRAX) 400 MG tablet Take 1 tablet (400 mg total) by mouth daily. 08/18/17  Yes Sindy Guadeloupe, MD  atorvastatin (LIPITOR) 40 MG tablet Take 40 mg by mouth at bedtime. 02/27/17  Yes [provider]  carvedilol (COREG) 25 MG tablet Take 25 mg by mouth 2 (two) times daily with a meal.   Yes [provider]  furosemide (LASIX) 40 MG tablet Take 20-40 mg by mouth daily. Pt takes 1 pill one day and alternates with 0.5 pill the next day ( this is alternating dose every other day)   Yes [provider]  gabapentin (NEURONTIN) 100 MG capsule Take 100 mg by mouth daily as needed (pain).    Yes [provider]  hydrALAZINE (APRESOLINE) 25 MG tablet Take 25 mg by mouth 3 (three) times daily.   Yes [provider]  insulin NPH-regular Human (NOVOLIN 70/30) (70-30) 100 UNIT/ML injection Inject 12-18 Units into the skin 2 (two) times daily. 18u (AM) and 12u (PM)   Yes [provider]  loperamide (IMODIUM) 2 MG capsule Take 1 capsule (2 mg total) by mouth as needed for diarrhea or loose stools. 09/08/17  Yes Dustin Flock, MD  oxyCODONE (OXY IR/ROXICODONE) 5 MG immediate release tablet Take 1 tablet (5 mg total) by mouth every 6 (six) hours as needed for severe pain. 09/08/17  Yes Dustin Flock, MD  tamsulosin  (FLOMAX) 0.4 MG CAPS capsule Take 0.4 mg by mouth daily.   Yes [provider]  vitamin B-12 (CYANOCOBALAMIN) 1000 MCG tablet Take 1,000 mcg by mouth daily.   Yes [provider]  vitamin C (ASCORBIC ACID) 500 MG tablet Take 500 mg by mouth daily.   Yes [provider]  fentaNYL (DURAGESIC - DOSED MCG/HR) 12 MCG/HR Place 1 patch (12.5 mcg total) onto the skin every 3 (three) days. 09/08/17   Dustin Flock, MD     Vital Signs: BP (!) 162/39   Pulse 80   Temp 100.3 F (37.9 C) (Oral)   Resp 18   Ht 6' (1.829 m)   Wt 208 lb (94.3 kg)   SpO2 94%   BMI 28.21 kg/m   Physical Exam  Constitutional: He is oriented to person, place, and time. He appears well-developed.  HENT:  Head: Normocephalic and atraumatic.  Cardiovascular: Normal rate.  Pulmonary/Chest: Effort normal. No respiratory distress.  Abdominal: He exhibits no distension.  Musculoskeletal:  He doesn't move well. He states he is "sore all over". But still confirms his lower back feels better.  Neurological: He is alert and oriented to person, place, and time.  Skin: Skin is warm and dry.  Psychiatric: He has a normal mood and affect. His behavior is normal. Judgment and thought content normal.  Vitals reviewed.   Imaging: Ir Kypho Lumbar Inc Fx Reduce Bone Bx Uni/bil Cannulation  Inc/imaging  Result Date: 09/12/2017 CLINICAL DATA:  History of multiple myeloma, now with symptomatic malignant L5 compression fracture. Patient with history of AICD/pacemaker and as such, presents today for fluoroscopic guided L5 vertebral body biopsy and cement augmentation. EXAM: FLUOROSCOPIC GUIDED KYPHOPLASTY OF THE L5 VERTEBRAL BODY COMPARISON:  Lumbar spine CT - 08/28/2017; PET-CT - 08/25/2017 MEDICATIONS: As antibiotic prophylaxis, Ancef 2 gm IV was ordered pre-procedure and administered intravenously within 1 hour of incision. ANESTHESIA/SEDATION: Moderate (conscious) sedation was employed during this procedure. A  total of Versed 4 mg and Fentanyl 200 mcg was administered intravenously. Moderate Sedation Time: 50 minutes. The patient's level of consciousness and vital signs were monitored continuously by radiology nursing throughout the procedure under my direct supervision. FLUOROSCOPY TIME:  11 Min, 54 seconds (4562 mGy) COMPLICATIONS: None immediate. TECHNIQUE: The procedure, risks (including but not limited to bleeding, infection, organ damage), benefits, and alternatives were explained to the patient. Questions regarding the procedure were encouraged and answered. The patient understands and consents to the procedure. The patient was placed prone on the fluoroscopic table. The skin overlying the lower lumbar region was then prepped and draped in the usual sterile fashion. Maximal barrier sterile technique was utilized including caps, mask, sterile gowns, sterile gloves, sterile drape, hand hygiene and skin antiseptic. Intravenous Fentanyl and Versed were administered as conscious sedation during continuous cardiorespiratory monitoring by the radiology RN. The left pedicle at L5 was then infiltrated with 1% lidocaine followed by the advancement of a Kyphon trocar needle through the left pedicle into the posterior one-third of the vertebral body. The trocar was removed and a bone biopsy was obtained at this location. Subsequently, the osteo drill was advanced to the anterior third of the vertebral body. The osteo drill was retracted. Through the working cannula, a Kyphon inflatable bone tamp 15 x 3 was advanced and positioned with the distal marker approximately 5 mm from the anterior aspect of the cortex. Appropriate positioning was confirmed on the AP projection. At this time, the balloon was expanded using contrast via a Kyphon inflation syringe device via micro tubing. In similar fashion, the right L5 pedicle was infiltrated with 1% lidocaine followed by the advancement of a second Kyphon trocar needle through the  right pedicle into the posterior third of the vertebral body. Again, a bone biopsy was obtained at this location. Subsequently, the osteo drill was coaxially advanced to the anterior right third. The osteo drill was exchanged for a Kyphon inflatable bone tamp 15 x 3, advanced to the 5 mm of the anterior aspect of the cortex. The balloon was then expanded using contrast as above. Inflations were continued until there was near apposition with the superior end plate. At this time, methylmethacrylate mixture was reconstituted in the Kyphon bone mixing device system. This was then loaded into the delivery mechanism, attached to Kyphon bone fillers. The balloons were deflated and removed followed by the instillation of methylmethacrylate mixture with excellent filling in the AP and lateral projections. No extravasation was noted in the disk spaces or posteriorly into the spinal canal. No epidural venous contamination was seen. The working cannulae and the bone filler were then retrieved and removed. Hemostasis was achieved with manual compression. The patient tolerated the procedure well without immediate postprocedural complication. IMPRESSION: 1. Technically successful L5 vertebral body augmentation using balloon kyphoplasty. 2. Technically successful L5 vertebral body biopsy. Electronically Signed   By: Sandi Mariscal M.D.   On: 09/12/2017 14:27    Labs:  CBC: Recent Labs  09/11/17 0419 09/11/17 1843 09/12/17 0510 09/13/17 0515 09/15/17 0426  WBC 4.4 4.5 4.7 3.6*  --   HGB 6.8* 7.3* 7.9* 8.1* 7.2*  HCT 21.5* 22.7* 24.5* 24.6*  --   PLT 107* 124* 141* 162  --     COAGS: Recent Labs    10/14/16 0805 08/20/17 0748 09/04/17 1325 09/12/17 0510  INR 1.01 1.11 1.05 1.10  APTT 28 31  --   --     BMP: Recent Labs    09/12/17 0510 09/13/17 0515 09/13/17 1422 09/14/17 0513 09/15/17 0426  NA 134* 138  --  138 139  K 4.4 5.3* 4.4 3.8 4.0  CL 107 111  --  109 109  CO2 20* 22  --  21* 22    GLUCOSE 97 132*  --  187* 101*  BUN 44* 41*  --  40* 31*  CALCIUM 6.7* 6.4*  --  6.3* 6.4*  CREATININE 2.29* 2.34*  --  2.18* 1.99*  GFRNONAA 27* 26*  --  28* 32*  GFRAA 31* 30*  --  33* 37*    LIVER FUNCTION TESTS: Recent Labs    07/30/17 0910 08/25/17 1323 09/04/17 1200 09/08/17 0347 09/10/17 2137  BILITOT 0.5 0.6 0.9  --  0.9  AST 57* 86* 18  --  17  ALT 92* 145* 54  --  39  ALKPHOS 120 125 81  --  70  PROT 8.9* 9.3* 6.5  --  6.1*  ALBUMIN 3.3* 3.4* 2.9* 2.2* 2.8*    Assessment and Plan:  Pathologic fracture of L5  S/P biopsy and kyphoplasty by Dr. Pascal Lux 09/12/17  Continue physical therapy for mobility  Await pathology results.  Electronically Signed: Murrell Redden, PA-C 09/15/2017, 11:50 AM   I spent a total of 15 Minutes at the the patient's bedside AND on the patient's hospital floor or unit, greater than 50% of which was counseling/coordinating care for f/u after kyphoplasty.

## 2017-09-15 NOTE — Care Management Important Message (Signed)
Important Message  Patient Details  Name: Adam Wall MRN: [id number] Date of Birth: December 04, 1945   Medicare Important Message Given:  Yes    Shelbie Ammons, RN 09/15/2017, 6:31 AM

## 2017-09-15 NOTE — Progress Notes (Signed)
Patient's blood is ready however patient will be going to the cancer center for radiation at 1430.  Will start patient's blood when he returns from the cancer center.  Clarise Cruz, RN

## 2017-09-15 NOTE — Progress Notes (Addendum)
Patient ID: Adam Wall, male   DOB: 12-Jul-1946, 72 y.o.   MRN: [id number]   [employer] PROGRESS NOTE  Adam Wall UMP:[id number] DOB: 03-Mar-1946 DOA: 09/10/2017 PCP: [name], MD  HPI/Subjective: Patient feeling okay.  Still having some back soreness.  Still feeling very weak.  Objective: Vitals:   09/15/17 1019 09/15/17 1300  BP:  (!) 135/49  Pulse: 80 69  Resp:  18  Temp:  99 F (37.2 C)  SpO2: 94% 96%    Filed Weights   09/12/17 1032 09/13/17 0452 09/14/17 0353  Weight: 96.2 kg (212 lb) 94.8 kg (209 lb) 94.3 kg (208 lb)    ROS: Review of Systems  Constitutional: Negative for chills and fever.  Eyes: Negative for blurred vision.  Respiratory: Negative for cough and shortness of breath.   Cardiovascular: Negative for chest pain.  Gastrointestinal: Negative for abdominal pain, constipation, diarrhea, nausea and vomiting.  Genitourinary: Negative for dysuria.  Musculoskeletal: Positive for back pain. Negative for joint pain.  Neurological: Negative for dizziness and headaches.   Exam: Physical Exam  Constitutional: He is oriented to person, place, and time.  HENT:  Nose: No mucosal edema.  Mouth/Throat: No oropharyngeal exudate or posterior oropharyngeal edema.  Eyes: Conjunctivae, EOM and lids are normal. Pupils are equal, round, and reactive to light.  Neck: No JVD present. Carotid bruit is not present. No edema present. No thyroid mass and no thyromegaly present.  Cardiovascular: S1 normal and S2 normal. Exam reveals no gallop.  No murmur heard. Pulses:      Dorsalis pedis pulses are 2+ on the right side, and 2+ on the left side.  Respiratory: No respiratory distress. He has no wheezes. He has no rhonchi. He has no rales.  GI: Soft. Bowel sounds are normal. There is no tenderness.  Musculoskeletal:       Right ankle: He exhibits no swelling.       Left ankle: He exhibits no swelling.  Lymphadenopathy:    He has no cervical adenopathy.   Neurological: He is alert and oriented to person, place, and time. No cranial nerve deficit.  Skin: Skin is warm. Nails show no clubbing.  Bilateral heel pressure injury right greater than left.  Psychiatric: He has a normal mood and affect.      Data Reviewed: Basic Metabolic Panel: Recent Labs  Lab 09/11/17 0419 09/12/17 0510 09/13/17 0515 09/13/17 1422 09/14/17 0513 09/15/17 0426  NA 132* 134* 138  --  138 139  K 4.3 4.4 5.3* 4.4 3.8 4.0  CL 106 107 111  --  109 109  CO2 21* 20* 22  --  21* 22  GLUCOSE 215* 97 132*  --  187* 101*  BUN 52* 44* 41*  --  40* 31*  CREATININE 2.66* 2.29* 2.34*  --  2.18* 1.99*  CALCIUM 6.4* 6.7* 6.4*  --  6.3* 6.4*   Liver Function Tests: Recent Labs  Lab 09/10/17 2137  AST 17  ALT 39  ALKPHOS 70  BILITOT 0.9  PROT 6.1*  ALBUMIN 2.8*    CBC: Recent Labs  Lab 09/10/17 2137 09/11/17 0419 09/11/17 1843 09/12/17 0510 09/13/17 0515 09/15/17 0426  WBC 6.4 4.4 4.5 4.7 3.6*  --   NEUTROABS 4.5  --   --   --   --   --   HGB 7.6* 6.8* 7.3* 7.9* 8.1* 7.2*  HCT 23.9* 21.5* 22.7* 24.5* 24.6*  --   MCV 70.6* 70.5* 71.2* 71.4* 71.5*  --  PLT 111* 107* 124* 141* 162  --    Cardiac Enzymes: Recent Labs  Lab 09/10/17 2137  TROPONINI 0.05*   BNP (last 3 results) Recent Labs    09/04/17 1325 09/10/17 2113  BNP 245.0* 137.0*   CBG: Recent Labs  Lab 09/14/17 1131 09/14/17 1653 09/14/17 2142 09/15/17 0739 09/15/17 1137  GLUCAP 211* 205* 144* 106* 130*    Recent Results (from the past 240 hour(s))  C difficile quick scan w PCR reflex     Status: None   Collection Time: 09/07/17 12:11 PM  Result Value Ref Range Status   C Diff antigen NEGATIVE NEGATIVE Final   C Diff toxin NEGATIVE NEGATIVE Final   C Diff interpretation No C. difficile detected.  Final    Comment: Performed at [facility], [address] [place], [place] [address]  Blood Culture (routine x 2)     Status: None   Collection Time: 09/10/17   9:36 PM  Result Value Ref Range Status   Specimen Description BLOOD LT [facility]  Final   Special Requests   Final    BOTTLES DRAWN AEROBIC AND ANAEROBIC Blood Culture adequate volume   Culture   Final    NO GROWTH 5 DAYS Performed at [facility], [address] [place], [place] [address]    Report Status 09/15/2017 FINAL  Final  Blood Culture (routine x 2)     Status: None   Collection Time: 09/10/17  9:36 PM  Result Value Ref Range Status   Specimen Description BLOOD RT [facility]  Final   Special Requests   Final    BOTTLES DRAWN AEROBIC AND ANAEROBIC Blood Culture adequate volume   Culture   Final    NO GROWTH 5 DAYS Performed at [facility], [address] [place], [place] [address]    Report Status 09/15/2017 FINAL  Final  Urine culture     Status: Abnormal   Collection Time: 09/11/17  5:42 AM  Result Value Ref Range Status   Specimen Description   Final    URINE, RANDOM Performed at [facility], [address], [place], [place] [address]    Special Requests   Final    NONE Performed at [facility], [address] [place], [place] [address]    Culture >=100,000 COLONIES/mL ENTEROCOCCUS FAECALIS (A)  Final   Report Status 09/13/2017 FINAL  Final   Organism ID, Bacteria ENTEROCOCCUS FAECALIS (A)  Final      Susceptibility   Enterococcus faecalis - MIC*    AMPICILLIN <=2 SENSITIVE Sensitive     LEVOFLOXACIN 1 SENSITIVE Sensitive     NITROFURANTOIN <=16 SENSITIVE Sensitive     VANCOMYCIN 1 SENSITIVE Sensitive     * >=100,000 COLONIES/mL ENTEROCOCCUS FAECALIS  MRSA PCR Screening     Status: None   Collection Time: 09/11/17  8:24 PM  Result Value Ref Range Status   MRSA by PCR NEGATIVE NEGATIVE Final    Comment:        The GeneXpert MRSA Assay (FDA approved for NASAL specimens only), is one component of a comprehensive MRSA colonization surveillance program. It is not intended to diagnose MRSA infection nor to guide or monitor  treatment for MRSA infections. Performed at [facility], [address] [place], [place] [address]      Studies: No results found.  Scheduled Meds: . acyclovir  400 mg Oral Daily  . amoxicillin-clavulanate  1 tablet Oral Q12H  . atorvastatin  40 mg  Oral QHS  . calcium carbonate  1,000 mg of elemental calcium Oral Q4H  . calcium-vitamin D  1 tablet Oral TID WC  . carvedilol  25 mg Oral BID WC  . docusate sodium  100 mg Oral BID  . fentaNYL  12.5 mcg Transdermal Q72H  . furosemide  40 mg Oral Daily  . insulin aspart  0-5 Units Subcutaneous QHS  . insulin aspart  0-9 Units Subcutaneous TID WC  . insulin aspart protamine- aspart  8 Units Subcutaneous BID WC  . tamsulosin  0.4 mg Oral Daily  . vitamin B-12  1,000 mcg Oral Daily  . vitamin C  500 mg Oral Daily    Assessment/Plan:  1. Symptomatic anemia.  Transfuse 1 unit of packed red blood cells on a hemoglobin of 7.2.   2. Lumbar spine compression fracture.  Interventional radiology did a bone biopsy and kyphoplasty.   still with very weak with physical therapy. 3. Advanced multiple myeloma.  Follow-up with oncology as outpatient. 4. Thrombocytopenia secondary to multiple myeloma.  5. Acute kidney failure on chronic kidney disease stage III.  Secondary to multiple myeloma.  Creatinine has improved to 1.99.  Continue Lasix today. 6. Acute cystitis.  Urine culture growing enterococcus.  Antibiotics changed to Augmentin. 7. Type 2 diabetes mellitus.  On sliding scale insulin.  Restart 70/30 insulin at lower dose 8. hyperlipidemia unspecified on atorvastatin 9. BPH on Flomax 10. Bilateral heel pressure injury.  Stage II skin skin is still intact.  Code Status:     Code Status Orders  (From admission, onward)        Start     Ordered   09/11/17 0309  Full code  Continuous     09/11/17 0308    Code Status History    Date Active Date Inactive Code Status Order ID Comments User Context   09/04/2017 17:55 09/08/2017  18:54 Full Code [id number]  [name], MD ED    Advance Directive Documentation     Most Recent Value  Type of Advance Directive  Healthcare Power of Attorney  Pre-existing out of facility DNR order (yellow form or pink MOST form)  No data  "MOST" Form in Place?  No data     Disposition Plan: Essentially out to rehab tomorrow if social worker can find a bed where patient can undergo radiation therapy.  Antibiotics:  Augmentin  Time spent: 25 minutes  [place]

## 2017-09-15 NOTE — Telephone Encounter (Signed)
Celestine called and reports that Adam Wall is being discharged today to SNF and she wants to be sure that everything is arranged before he leaves she requests a call back [phone]

## 2017-09-15 NOTE — Clinical Social Work Note (Signed)
Clinical Social Work Assessment  Patient Details  Name: Adam Wall MRN: [id number] Date of Birth: [birth year]  Date of referral:  09/15/17               Reason for consult:  Facility Placement                Permission sought to share information with:  Chartered certified accountant granted to share information::  Yes, Verbal Permission Granted  Name::      Adam::   Wall   Relationship::     Contact Information:     Housing/Transportation Living arrangements for the past 2 months:  [place] of Information:  Spouse Patient Interpreter Needed:  None Criminal Activity/Legal Involvement Pertinent to Current Situation/Hospitalization:  No - Comment as needed Significant Relationships:  Adult Children Lives with:  Spouse Do you feel safe going back to the place where you live?  Yes Need for family participation in patient care:  Yes (Comment)  Care giving concerns:  Patient lives in [place] with his wife Adam Wall.    Social Worker assessment / plan:  Holiday representative (CSW) received SNF consult. PT is recommending SNF. Per chart patient is not alert and oriented X4 so CSW contacted his wife Adam Wall. Per wife patient lives in [place] with her and they have 1 adult son Adam Wall. CSW explained SNF process and that [facility] will have to approve SNF. Per wife patient was suppose to start radiation at [facility] cancer center today and has 10 treatments. CSW explained that transportation to radiation may be a barrier to SNF bed offers. Wife verbalized her understanding and is agreeable to SNF search in [place]. FL2 complete and faxed out.   CSW presented bed offers to wife and she chose [facility]. Per wife she can transport patient to radiation from [facility]. Per [facility] admissions coordinator at [facility] is closed today because of the [facility] holiday. Per Claiborne Billings she will  start [facility] SNF authorization tomorrow. CSW will continue to follow and assist as needed.   Employment status:  Disabled (Comment on whether or not currently receiving Disability), Retired Nurse, adult PT Recommendations:  [place] / Referral to community resources:  [place]  Patient/Family's Response to care:  Patient's wife is agreeable for patient to D/C to [facility].   Patient/Family's Understanding of and Emotional Response to Diagnosis, Current Treatment, and Prognosis:  Patient's wife was very pleasant and thanked CSW for assistance.   Emotional Assessment Appearance:  Appears stated age Attitude/Demeanor/Rapport:  Unable to Assess Affect (typically observed):  Unable to Assess Orientation:  Oriented to Self, Oriented to Place, Fluctuating Orientation (Suspected and/or reported Sundowners) Alcohol / Substance use:  Not Applicable Psych involvement (Current and /or in the community):  No (Comment)  Discharge Needs  Concerns to be addressed:  Discharge Planning Concerns Readmission within the last 30 days:  No Current discharge risk:  Dependent with Mobility, Chronically ill Barriers to Discharge:  Continued Medical Work up   [employer], Veronia Beets, LCSW 09/15/2017, 1:50 PM

## 2017-09-16 ENCOUNTER — Inpatient Hospital Stay: Payer: Medicare Other | Admitting: Oncology

## 2017-09-16 ENCOUNTER — Inpatient Hospital Stay: Payer: Medicare Other

## 2017-09-16 ENCOUNTER — Ambulatory Visit: Payer: Medicare Other | Admitting: Oncology

## 2017-09-16 ENCOUNTER — Other Ambulatory Visit: Payer: Self-pay | Admitting: Oncology

## 2017-09-16 ENCOUNTER — Ambulatory Visit
Admission: RE | Admit: 2017-09-16 | Discharge: 2017-09-16 | Disposition: A | Discharge status: Home / Self Care | Payer: Medicare Other | Source: Ambulatory Visit | Attending: Radiation Oncology | Admitting: Radiation Oncology

## 2017-09-16 LAB — DIFFERENTIAL
Basophils Absolute: 0 10*3/uL (ref 0–0.1)
Basophils Relative: 2 %
EOS PCT: 6 %
Eosinophils Absolute: 0.2 10*3/uL (ref 0–0.7)
LYMPHS ABS: 0.7 10*3/uL — AB (ref 1.0–3.6)
LYMPHS PCT: 26 %
Monocytes Absolute: 0.4 10*3/uL (ref 0.2–1.0)
Monocytes Relative: 15 %
Neutro Abs: 1.5 10*3/uL (ref 1.4–6.5)
Neutrophils Relative %: 51 %

## 2017-09-16 LAB — TYPE AND SCREEN
ABO/RH(D): O POS
ANTIBODY SCREEN: NEGATIVE
UNIT DIVISION: 0

## 2017-09-16 LAB — CBC
HEMATOCRIT: 26.4 % — AB (ref 40.0–52.0)
Hemoglobin: 8.8 g/dL — ABNORMAL LOW (ref 13.0–18.0)
MCH: 24 pg — ABNORMAL LOW (ref 26.0–34.0)
MCHC: 33.2 g/dL (ref 32.0–36.0)
MCV: 72.2 fL — AB (ref 80.0–100.0)
Platelets: 188 10*3/uL (ref 150–440)
RBC: 3.66 MIL/uL — ABNORMAL LOW (ref 4.40–5.90)
RDW: 19 % — AB (ref 11.5–14.5)
WBC: 2.9 10*3/uL — AB (ref 3.8–10.6)

## 2017-09-16 LAB — BPAM RBC
BLOOD PRODUCT EXPIRATION DATE: 201902082359
ISSUE DATE / TIME: 201901211536
UNIT TYPE AND RH: 5100

## 2017-09-16 LAB — SURGICAL PATHOLOGY

## 2017-09-16 LAB — GLUCOSE, CAPILLARY
Glucose-Capillary: 98 mg/dL (ref 65–99)
Glucose-Capillary: 146 mg/dL — ABNORMAL HIGH (ref 65–99)
Glucose-Capillary: 217 mg/dL — ABNORMAL HIGH (ref 65–99)

## 2017-09-16 MED ORDER — ACETAMINOPHEN 325 MG PO TABS: 650.0000 mg | ORAL_TABLET | Freq: Four times a day (QID) | ORAL | Status: AC | PRN

## 2017-09-16 MED ORDER — OXYCODONE HCL 5 MG PO TABS: 5.0000 mg | ORAL_TABLET | ORAL | 0 refills | Status: AC | PRN

## 2017-09-16 MED ORDER — CYCLOBENZAPRINE HCL 10 MG PO TABS
5.0000 mg | ORAL_TABLET | Freq: Three times a day (TID) | ORAL | Status: DC
  Administered 2017-09-16: 5 mg via ORAL
  Filled 2017-09-16: qty 1

## 2017-09-16 MED ORDER — CYCLOBENZAPRINE HCL 5 MG PO TABS: 5.0000 mg | ORAL_TABLET | Freq: Three times a day (TID) | ORAL | 0 refills | Status: DC | PRN

## 2017-09-16 MED ORDER — INSULIN NPH ISOPHANE & REGULAR (70-30) 100 UNIT/ML ~~LOC~~ SUSP: SUBCUTANEOUS | 0 refills | Status: DC

## 2017-09-16 MED ORDER — DEXAMETHASONE 4 MG PO TABS
20.0000 mg | ORAL_TABLET | Freq: Once | ORAL | Status: AC
  Administered 2017-09-16: 12:00:00 20 mg via ORAL
  Filled 2017-09-16: qty 5

## 2017-09-16 MED ORDER — TRAZODONE HCL 50 MG PO TABS: 25.0000 mg | ORAL_TABLET | Freq: Every evening | ORAL | 0 refills | Status: DC | PRN

## 2017-09-16 MED ORDER — ALENDRONATE SODIUM 70 MG PO TABS: 70.0000 mg | ORAL_TABLET | ORAL | 0 refills | Status: DC

## 2017-09-16 MED ORDER — PROCHLORPERAZINE MALEATE 10 MG PO TABS
10.0000 mg | ORAL_TABLET | Freq: Once | ORAL | Status: AC
  Administered 2017-09-16: 10 mg via ORAL
  Filled 2017-09-16 (×2): qty 1

## 2017-09-16 MED ORDER — CALCIUM CARBONATE-VITAMIN D 500-200 MG-UNIT PO TABS: 1.0000 | ORAL_TABLET | Freq: Three times a day (TID) | ORAL | 0 refills | Status: DC

## 2017-09-16 MED ORDER — DOCUSATE SODIUM 100 MG PO CAPS: 100.0000 mg | ORAL_CAPSULE | Freq: Two times a day (BID) | ORAL | 0 refills | Status: DC

## 2017-09-16 MED ORDER — BORTEZOMIB CHEMO SQ INJECTION 3.5 MG (2.5MG/ML)
1.3000 mg/m2 | Freq: Once | INTRAMUSCULAR | Status: AC
  Administered 2017-09-16: 16:00:00 2.75 mg via SUBCUTANEOUS
  Filled 2017-09-16: qty 2.75

## 2017-09-16 MED ORDER — AMOXICILLIN-POT CLAVULANATE 875-125 MG PO TABS: 1.0000 | ORAL_TABLET | Freq: Two times a day (BID) | ORAL | 0 refills | Status: DC

## 2017-09-16 MED ORDER — FENTANYL 12 MCG/HR TD PT72: 12.5000 ug | MEDICATED_PATCH | TRANSDERMAL | 0 refills | Status: DC

## 2017-09-16 NOTE — Plan of Care (Signed)
  Progressing Education: Knowledge of General Education information will improve 09/16/2017 0351 - Progressing by Sonda Primes, RN Activity: Risk for activity intolerance will decrease 09/16/2017 0351 - Progressing by Sonda Primes, RN Pain Managment: General experience of comfort will improve 09/16/2017 0351 - Progressing by Sonda Primes, RN Safety: Ability to remain free from injury will improve 09/16/2017 0351 - Progressing by Sonda Primes, RN Skin Integrity: Risk for impaired skin integrity will decrease 09/16/2017 0351 - Progressing by Sonda Primes, RN

## 2017-09-16 NOTE — Clinical Social Work Note (Signed)
Patient to be d/c'ed today to [facility].  Patient and family agreeable to plans will transport via ems RN to call report to room 2A [phone].  Evette Cristal, MSW, [place]

## 2017-09-16 NOTE — Discharge Summary (Signed)
[facility] [place] NAME: Adam Wall    MR#:  [id number]  DATE OF BIRTH:  11/28/1945  DATE OF ADMISSION:  09/10/2017 ADMITTING PHYSICIAN: Amelia Jo, MD  DATE OF DISCHARGE: 09/16/2017  PRIMARY CARE PHYSICIAN: [name], MD    ADMISSION DIAGNOSIS:  Acute on chronic renal insufficiency [N28.9, N18.9] Chronic midline low back pain without sciatica [M54.5, G89.29] Hypotension due to hypovolemia [I95.89, E86.1]  DISCHARGE DIAGNOSIS:  Active Problems:   Back pain, acute   Pressure injury of skin   SECONDARY DIAGNOSIS:   Past Medical History:  Diagnosis Date  . AICD (automatic cardioverter/defibrillator) present   . Anemia   . Blockage of coronary artery of heart ([place])   . CHF (congestive heart failure) ([place])   . Chronic kidney disease 05/22/2017   nephrectomy-right  . Diabetes mellitus without complication ([place])   . Dysrhythmia   . Heart murmur   . Hypertension   . Hypochloremia   . LBBB (left bundle branch block)   . Neuropathy   . Presence of permanent cardiac pacemaker   . Right renal mass   . 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  Lumbar spine compression fracture.  Interventional radiology did a kyphoplasty and bone biopsy.  Patient still has some soreness in his back.  Bone biopsy negative for malignancy.  Start calcium and vitamin D and Fosamax. 2.  Symptomatic anemia.  The patient was transfused 2 units of packed red blood cells during the hospital course. Hemoglobin upon discharge 8.8. 3.  Advanced multiple myeloma.  Follow-up with Dr. Janese Banks as outpatient 4.  Thrombocytopenia secondary to multiple myeloma. 5.  Acute kidney failure on chronic kidney disease stage III.  Last creatinine 1.99.  Creatinine peaked at 2.75. 6.  Acute cystitis.  Urine culture growing out enterococcus.  Antibiotics changed to Augmentin and will give 5 more doses. 7.  Type 2 diabetes mellitus.  I restarted 7030 insulin at 8  units twice daily. 8.  Hyperlipidemia on atorvastatin 9.  BPH on Flomax 10.  Bilateral heel pressure injury.  Skin is still intact but soft tissue injury present.  Can consider podiatry consultation at facility. 11.  Weakness physical therapy needed at rehab. 12.  Left neck stiffness.  Trial of Flexeril as needed  DISCHARGE CONDITIONS:   Fair  CONSULTS OBTAINED:  Treatment Team:  Sindy Guadeloupe, MD  DRUG ALLERGIES:  No Known Allergies  DISCHARGE MEDICATIONS:   Allergies as of 09/16/2017   No Known Allergies     Medication List    STOP taking these medications   hydrALAZINE 25 MG tablet Commonly known as:  APRESOLINE   loperamide 2 MG capsule Commonly known as:  IMODIUM     TAKE these medications   acetaminophen 325 MG tablet Commonly known as:  TYLENOL Take 2 tablets (650 mg total) by mouth every 6 (six) hours as needed for mild pain (or Fever >/= 101). What changed:    medication strength  how much to take  when to take this  reasons to take this   acyclovir 400 MG tablet Commonly known as:  ZOVIRAX Take 1 tablet (400 mg total) by mouth daily.   alendronate 70 MG tablet Commonly known as:  FOSAMAX Take 1 tablet (70 mg total) by mouth once a week. Take with a full glass of water on an empty stomach.   amoxicillin-clavulanate 875-125 MG tablet Commonly known as:  AUGMENTIN Take 1 tablet  by mouth every 12 (twelve) hours.   atorvastatin 40 MG tablet Commonly known as:  LIPITOR Take 40 mg by mouth at bedtime.   calcium-vitamin D 500-200 MG-UNIT tablet Commonly known as:  OSCAL WITH D Take 1 tablet by mouth 3 (three) times daily with meals.   carvedilol 25 MG tablet Commonly known as:  COREG Take 25 mg by mouth 2 (two) times daily with a meal.   cyclobenzaprine 5 MG tablet Commonly known as:  FLEXERIL Take 1 tablet (5 mg total) by mouth 3 (three) times daily as needed for muscle spasms.   docusate sodium 100 MG capsule Commonly known as:   COLACE Take 1 capsule (100 mg total) by mouth 2 (two) times daily.   fentaNYL 12 MCG/HR Commonly known as:  DURAGESIC - dosed mcg/hr Place 1 patch (12.5 mcg total) onto the skin every 3 (three) days.   furosemide 40 MG tablet Commonly known as:  LASIX Take 20-40 mg by mouth daily. Pt takes 1 pill one day and alternates with 0.5 pill the next day ( this is alternating dose every other day)   gabapentin 100 MG capsule Commonly known as:  NEURONTIN Take 100 mg by mouth daily as needed (pain).   insulin NPH-regular Human (70-30) 100 UNIT/ML injection Commonly known as:  NOVOLIN 70/30 8u (AM) and 8u (PM) What changed:    how much to take  how to take this  when to take this  additional instructions   oxyCODONE 5 MG immediate release tablet Commonly known as:  Oxy IR/ROXICODONE Take 1 tablet (5 mg total) by mouth every 4 (four) hours as needed for moderate pain or severe pain. What changed:    when to take this  reasons to take this   tamsulosin 0.4 MG Caps capsule Commonly known as:  FLOMAX Take 0.4 mg by mouth daily.   traZODone 50 MG tablet Commonly known as:  DESYREL Take 0.5 tablets (25 mg total) by mouth at bedtime as needed for sleep.   vitamin B-12 1000 MCG tablet Commonly known as:  CYANOCOBALAMIN Take 1,000 mcg by mouth daily.   vitamin C 500 MG tablet Commonly known as:  ASCORBIC ACID Take 500 mg by mouth daily.        DISCHARGE INSTRUCTIONS:   Follow-up at rehab 1 day Follow-up at radiation therapy daily Follow-up with Dr. Janese Banks oncology  If you experience worsening of your admission symptoms, develop shortness of breath, life threatening emergency, suicidal or homicidal thoughts you must seek medical attention immediately by calling 911 or calling your MD immediately  if symptoms less severe.  You Must read complete instructions/literature along with all the possible adverse reactions/side effects for all the Medicines you take and that have been  prescribed to you. Take any new Medicines after you have completely understood and accept all the possible adverse reactions/side effects.   Please note  You were cared for by a hospitalist during your hospital stay. If you have any questions about your discharge medications or the care you received while you were in the hospital after you are discharged, you can call the unit and asked to speak with the hospitalist on call if the hospitalist that took care of you is not available. Once you are discharged, your primary care physician will handle any further medical issues. Please note that NO REFILLS for any discharge medications will be authorized once you are discharged, as it is imperative that you return to your primary care physician (or establish a  relationship with a primary care physician if you do not have one) for your aftercare needs so that they can reassess your need for medications and monitor your lab values.    Today   CHIEF COMPLAINT:   Chief Complaint  Patient presents with  . Back Pain    HISTORY OF PRESENT ILLNESS:  Danthony Kendrix  is a 72 y.o. male presenting back to the ER with compression fracture pain in his back   VITAL SIGNS:  Blood pressure (!) 178/53, pulse 77, temperature 98.8 F (37.1 C), temperature source Oral, resp. rate 15, height 6' (1.829 m), weight 87.5 kg (193 lb), SpO2 96 %.    PHYSICAL EXAMINATION:  GENERAL:  72 y.o.-year-old patient lying in the bed with no acute distress.  EYES: Pupils equal, round, reactive to light and accommodation. No scleral icterus. Extraocular muscles intact.  HEENT: Head atraumatic, normocephalic. Oropharynx and nasopharynx clear.  NECK:  Supple, no jugular venous distention. No thyroid enlargement, no tenderness.  Left neck tightness with movement. LUNGS: Normal breath sounds bilaterally, no wheezing, rales,rhonchi or crepitation. No use of accessory muscles of respiration.  CARDIOVASCULAR: S1, S2 normal. No murmurs,  rubs, or gallops.  ABDOMEN: Soft, non-tender, non-distended. Bowel sounds present. No organomegaly or mass.  EXTREMITIES: 2+ pedal edema, no cyanosis, or clubbing.  NEUROLOGIC: Cranial nerves II through XII are intact. Muscle strength 4/5 in all extremities. Sensation intact. Gait not checked.  PSYCHIATRIC: The patient is alert and oriented x 3.  SKIN: Skin pressure injury bilateral heels right greater than left.  DATA REVIEW:   CBC Recent Labs  Lab 09/16/17 0414  WBC 2.9*  HGB 8.8*  HCT 26.4*  PLT 188    Chemistries  Recent Labs  Lab 09/10/17 2137  09/15/17 0426  NA 131*   < > 139  K 4.8   < > 4.0  CL 103   < > 109  CO2 19*   < > 22  GLUCOSE 249*   < > 101*  BUN 55*   < > 31*  CREATININE 2.75*   < > 1.99*  CALCIUM 6.9*   < > 6.4*  AST 17  --   --   ALT 39  --   --   ALKPHOS 70  --   --   BILITOT 0.9  --   --    < > = values in this interval not displayed.    Cardiac Enzymes Recent Labs  Lab 09/10/17 2137  TROPONINI 0.05*    Microbiology Results  Results for orders placed or performed during the hospital encounter of 09/10/17  Blood Culture (routine x 2)     Status: None   Collection Time: 09/10/17  9:36 PM  Result Value Ref Range Status   Specimen Description BLOOD LT [facility]  Final   Special Requests   Final    BOTTLES DRAWN AEROBIC AND ANAEROBIC Blood Culture adequate volume   Culture   Final    NO GROWTH 5 DAYS Performed at [facility], [address], [place], [place] [address]    Report Status 09/15/2017 FINAL  Final  Blood Culture (routine x 2)     Status: None   Collection Time: 09/10/17  9:36 PM  Result Value Ref Range Status   Specimen Description BLOOD RT [facility]  Final   Special Requests   Final    BOTTLES DRAWN AEROBIC AND ANAEROBIC Blood Culture adequate volume   Culture   Final    NO GROWTH 5  DAYS Performed at [facility], [address] [place], [place] [address]    Report Status 09/15/2017 FINAL  Final  Urine culture      Status: Abnormal   Collection Time: 09/11/17  5:42 AM  Result Value Ref Range Status   Specimen Description   Final    URINE, RANDOM Performed at [facility], [address] [place], [place] [address]    Special Requests   Final    NONE Performed at [facility], [address] [place], [place] [address]    Culture >=100,000 COLONIES/mL ENTEROCOCCUS FAECALIS (A)  Final   Report Status 09/13/2017 FINAL  Final   Organism ID, Bacteria ENTEROCOCCUS FAECALIS (A)  Final      Susceptibility   Enterococcus faecalis - MIC*    AMPICILLIN <=2 SENSITIVE Sensitive     LEVOFLOXACIN 1 SENSITIVE Sensitive     NITROFURANTOIN <=16 SENSITIVE Sensitive     VANCOMYCIN 1 SENSITIVE Sensitive     * >=100,000 COLONIES/mL ENTEROCOCCUS FAECALIS  MRSA PCR Screening     Status: None   Collection Time: 09/11/17  8:24 PM  Result Value Ref Range Status   MRSA by PCR NEGATIVE NEGATIVE Final    Comment:        The GeneXpert MRSA Assay (FDA approved for NASAL specimens only), is one component of a comprehensive MRSA colonization surveillance program. It is not intended to diagnose MRSA infection nor to guide or monitor treatment for MRSA infections. Performed at [facility], [address], [place], [place] [address]       Management plans discussed with the patient, family and they are in agreement.  CODE STATUS:     Code Status Orders  (From admission, onward)        Start     Ordered   09/11/17 0309  Full code  Continuous     09/11/17 0308    Code Status History    Date Active Date Inactive Code Status Order ID Comments User Context   09/04/2017 17:55 09/08/2017 18:54 Full Code [id number]  [name], MD ED    Advance Directive Documentation     Most Recent Value  Type of Advance Directive  Healthcare Power of Attorney  Pre-existing out of facility DNR order (yellow form or pink MOST form)  No data  "MOST" Form in Place?  No data      TOTAL  TIME TAKING CARE OF THIS PATIENT: 35 minutes.    [name] M.D on 09/16/2017 at 2:44 PM  Between 7am to 6pm - Pager - [phone]  After 6pm go to [email or web address] - password [email or web address] [facility]  [phone]  CC: Primary care physician; [name], MD

## 2017-09-16 NOTE — Progress Notes (Signed)
Report called to [facility] and EMS transporting patient to [facility] at this time.  IV removed.  Patient's wife at bedside and aware.  Clarise Cruz, RN

## 2017-09-16 NOTE — Progress Notes (Signed)
Per [facility] admissions coordinator at [facility] SNF authorization has been started today.   [employer], LCSW [phone]

## 2017-09-16 NOTE — Progress Notes (Signed)
Physical Therapy Treatment Patient Details Name: Adam Wall MRN: [id number] DOB: 1946-04-02 Today's Date: 09/16/2017    History of Present Illness Adam Wall is a 72yo black male who comes to [facility] on 09/10/17 c uncontrolled back pain. He was schedule for L5 kyphoplasty, and had been largely bed bound at home for the past several days. PMH: multiple myeolma, CHF, DM2, HTN, anemia, CKD, AICD. PT is s/p 1uPRBC, now Hb: 8.1, HCT: 24.6. Pt is now s/p L5 kyphoplasty (1/18) and with bilat heel pressure ulcers.     PT Comments    Pt ready to get up from bed this am.  To edge of bed with overall poor technique when left to do himself and needs redirection for proper techniques.  He is able to manage his own LE's today however.  Required min/mod a x 2 for pt comfort.  Once sitting on edge of bed he is able to maintain his balance without assist.  He stood with min a x 2 from elevated surface.  He prefers to count to 3 himself.  Once standing he takes several moments to gain his balance.  He is able to ambulate around the bed this am.  Upon approaching recliner he does pick up his speed some and needs verbal cues to slow down.  After he turns almost toward chair he does experience a LOB which requires mod a x 1 to recover and guide him to the recliner safely.  Pt became fearful of falling when I changed my hand position to allow him to get closer to chair to sit despite hands on assist at all times and only min guard/assist during gait.  Discussed with pt and he voiced concern over his balance and his awareness of high risk for falling.  Encouragement provided.    SNF remains appropriate given limited mobility, poor use of back precaution strategies, decreased balance and increased pain/fear which increase his risk of falling.   Discussed with CNA and encouraged +2 assist for pt safety and comfort.     Follow Up Recommendations  SNF     Equipment Recommendations  None recommended by PT     Recommendations for Other Services       Precautions / Restrictions Precautions Precautions: Fall;Back Precaution Booklet Issued: No Precaution Comments: educated on bed mobility/log rolling as he has overall poor technique when he attempts without cues.   Restrictions Weight Bearing Restrictions: No    Mobility  Bed Mobility Overal bed mobility: Independent Bed Mobility: Supine to Sit;Rolling Rolling: Min assist   Supine to sit: Mod assist     General bed mobility comments: able to manage LE's to edge of bed, needs assist for trunk and verbal cues to roll.  Transfers Overall transfer level: Needs assistance Equipment used: Rolling walker (2 wheeled) Transfers: Sit to/from Stand Sit to Stand: Mod assist;From elevated surface;+2 safety/equipment            Ambulation/Gait Ambulation/Gait assistance: Min assist Ambulation Distance (Feet): 12 Feet Assistive device: Rolling walker (2 wheeled) Gait Pattern/deviations: Step-to pattern;Decreased step length - right;Decreased step length - left Gait velocity: decreased Gait velocity interpretation: <1.8 ft/sec, indicative of risk for recurrent falls General Gait Details: small choppy steps; heavy lean on rw   Stairs            Wheelchair Mobility    Modified Rankin (Stroke Patients Only)       Balance Overall balance assessment: Needs assistance Sitting-balance support: Bilateral upper extremity supported;Feet supported Sitting balance-Leahy  Scale: Fair     Standing balance support: Bilateral upper extremity supported Standing balance-Leahy Scale: Poor Standing balance comment: PT aware of balance deficits                            Cognition Arousal/Alertness: Awake/alert Behavior During Therapy: WFL for tasks assessed/performed Overall Cognitive Status: Within Functional Limits for tasks assessed                                        Exercises      General Comments         Pertinent Vitals/Pain Pain Assessment: 0-10 Pain Score: 8  Pain Location: Back Pain Descriptors / Indicators: Constant;Sore Pain Intervention(s): Premedicated before session;Limited activity within patient's tolerance    Home Living                      Prior Function            PT Goals (current goals can now be found in the care plan section) Progress towards PT goals: Progressing toward goals    Frequency    7X/week      PT Plan Current plan remains appropriate    Co-evaluation              AM-PAC PT "6 Clicks" Daily Activity  Outcome Measure  Difficulty turning over in bed (including adjusting bedclothes, sheets and blankets)?: Unable Difficulty moving from lying on back to sitting on the side of the bed? : Unable Difficulty sitting down on and standing up from a chair with arms (e.g., wheelchair, bedside commode, etc,.)?: Unable Help needed moving to and from a bed to chair (including a wheelchair)?: A Lot Help needed walking in hospital room?: A Lot Help needed climbing 3-5 steps with a railing? : Total 6 Click Score: 8    End of Session Equipment Utilized During Treatment: Gait belt Activity Tolerance: Patient limited by pain;Patient limited by fatigue Patient left: in chair;with bed alarm set;with call bell/phone within reach Nurse Communication: Mobility status       Time: 4008-6761 PT Time Calculation (min) (ACUTE ONLY): 16 min  Charges:  $Gait Training: 8-22 mins                    G Codes:       Chesley Noon, PTA 09/16/17, 11:03 AM'

## 2017-09-17 ENCOUNTER — Ambulatory Visit
Admission: RE | Admit: 2017-09-17 | Discharge: 2017-09-17 | Disposition: A | Discharge status: Home / Self Care | Payer: Medicare Other | Source: Ambulatory Visit | Attending: Radiation Oncology | Admitting: Radiation Oncology

## 2017-09-17 DIAGNOSIS — Z79899 Other long term (current) drug therapy: Secondary | ICD-10-CM | POA: Diagnosis not present

## 2017-09-17 DIAGNOSIS — C9000 Multiple myeloma not having achieved remission: Secondary | ICD-10-CM | POA: Diagnosis present

## 2017-09-17 DIAGNOSIS — I447 Left bundle-branch block, unspecified: Secondary | ICD-10-CM | POA: Diagnosis not present

## 2017-09-17 DIAGNOSIS — Z794 Long term (current) use of insulin: Secondary | ICD-10-CM | POA: Diagnosis not present

## 2017-09-17 DIAGNOSIS — Z87891 Personal history of nicotine dependence: Secondary | ICD-10-CM | POA: Diagnosis not present

## 2017-09-17 DIAGNOSIS — G629 Polyneuropathy, unspecified: Secondary | ICD-10-CM | POA: Diagnosis not present

## 2017-09-17 DIAGNOSIS — Z510 Encounter for antineoplastic radiation therapy: Secondary | ICD-10-CM | POA: Diagnosis not present

## 2017-09-17 DIAGNOSIS — Z85528 Personal history of other malignant neoplasm of kidney: Secondary | ICD-10-CM | POA: Diagnosis not present

## 2017-09-17 DIAGNOSIS — E119 Type 2 diabetes mellitus without complications: Secondary | ICD-10-CM | POA: Diagnosis not present

## 2017-09-17 DIAGNOSIS — Z7982 Long term (current) use of aspirin: Secondary | ICD-10-CM | POA: Diagnosis not present

## 2017-09-17 DIAGNOSIS — I129 Hypertensive chronic kidney disease with stage 1 through stage 4 chronic kidney disease, or unspecified chronic kidney disease: Secondary | ICD-10-CM | POA: Diagnosis not present

## 2017-09-17 DIAGNOSIS — Z95810 Presence of automatic (implantable) cardiac defibrillator: Secondary | ICD-10-CM | POA: Diagnosis not present

## 2017-09-17 DIAGNOSIS — R011 Cardiac murmur, unspecified: Secondary | ICD-10-CM | POA: Diagnosis not present

## 2017-09-17 DIAGNOSIS — E878 Other disorders of electrolyte and fluid balance, not elsewhere classified: Secondary | ICD-10-CM | POA: Diagnosis not present

## 2017-09-17 DIAGNOSIS — N189 Chronic kidney disease, unspecified: Secondary | ICD-10-CM | POA: Diagnosis not present

## 2017-09-17 DIAGNOSIS — I509 Heart failure, unspecified: Secondary | ICD-10-CM | POA: Diagnosis not present

## 2017-09-18 ENCOUNTER — Ambulatory Visit
Admission: RE | Admit: 2017-09-18 | Discharge: 2017-09-18 | Disposition: A | Discharge status: Home / Self Care | Payer: Medicare Other | Source: Ambulatory Visit | Attending: Radiation Oncology | Admitting: Radiation Oncology

## 2017-09-18 DIAGNOSIS — C9000 Multiple myeloma not having achieved remission: Secondary | ICD-10-CM | POA: Diagnosis not present

## 2017-09-19 ENCOUNTER — Inpatient Hospital Stay: Payer: Medicare Other

## 2017-09-19 ENCOUNTER — Ambulatory Visit
Admission: RE | Admit: 2017-09-19 | Discharge: 2017-09-19 | Disposition: A | Discharge status: Home / Self Care | Payer: Medicare Other | Source: Ambulatory Visit | Attending: Radiation Oncology | Admitting: Radiation Oncology

## 2017-09-19 DIAGNOSIS — C9000 Multiple myeloma not having achieved remission: Secondary | ICD-10-CM | POA: Diagnosis not present

## 2017-09-22 ENCOUNTER — Other Ambulatory Visit (HOSPITAL_COMMUNITY): Payer: Self-pay | Admitting: Interventional Radiology

## 2017-09-22 ENCOUNTER — Telehealth: Payer: Self-pay | Admitting: *Deleted

## 2017-09-22 ENCOUNTER — Ambulatory Visit
Admission: RE | Admit: 2017-09-22 | Discharge: 2017-09-22 | Disposition: A | Discharge status: Home / Self Care | Payer: Medicare Other | Source: Ambulatory Visit | Attending: Radiation Oncology | Admitting: Radiation Oncology

## 2017-09-22 DIAGNOSIS — S32050A Wedge compression fracture of fifth lumbar vertebra, initial encounter for closed fracture: Secondary | ICD-10-CM

## 2017-09-22 DIAGNOSIS — C9000 Multiple myeloma not having achieved remission: Secondary | ICD-10-CM | POA: Diagnosis not present

## 2017-09-22 NOTE — Telephone Encounter (Signed)
Called and left message with wife that Drr. Janese Banks wants to see pt tom, have labs and get velcade. She only wants him to get it 1 time a week.  It will be after he gets radiation. I asked her to call me back to make sure that is ok. He does ride our [employer] for transportation.  Also I am trying to call [facility] to discuss revlimid for pt and how it should be handled since he is in facility.

## 2017-09-23 ENCOUNTER — Encounter: Payer: Self-pay | Admitting: Oncology

## 2017-09-23 ENCOUNTER — Inpatient Hospital Stay: Payer: Medicare Other

## 2017-09-23 ENCOUNTER — Inpatient Hospital Stay (HOSPITAL_BASED_OUTPATIENT_CLINIC_OR_DEPARTMENT_OTHER): Payer: Medicare Other | Admitting: Oncology

## 2017-09-23 ENCOUNTER — Ambulatory Visit
Admission: RE | Admit: 2017-09-23 | Discharge: 2017-09-23 | Disposition: A | Discharge status: Home / Self Care | Payer: Medicare Other | Source: Ambulatory Visit | Attending: Radiation Oncology | Admitting: Radiation Oncology

## 2017-09-23 ENCOUNTER — Other Ambulatory Visit: Payer: Self-pay | Admitting: Pharmacist

## 2017-09-23 VITALS — BP 161/74 | HR 69 | Temp 97.3°F | Resp 16 | Wt 202.0 lb

## 2017-09-23 DIAGNOSIS — C9000 Multiple myeloma not having achieved remission: Secondary | ICD-10-CM

## 2017-09-23 DIAGNOSIS — D509 Iron deficiency anemia, unspecified: Secondary | ICD-10-CM | POA: Diagnosis not present

## 2017-09-23 DIAGNOSIS — Z87891 Personal history of nicotine dependence: Secondary | ICD-10-CM | POA: Diagnosis not present

## 2017-09-23 DIAGNOSIS — G893 Neoplasm related pain (acute) (chronic): Secondary | ICD-10-CM | POA: Diagnosis not present

## 2017-09-23 DIAGNOSIS — N189 Chronic kidney disease, unspecified: Secondary | ICD-10-CM | POA: Diagnosis not present

## 2017-09-23 DIAGNOSIS — Z5112 Encounter for antineoplastic immunotherapy: Secondary | ICD-10-CM | POA: Diagnosis not present

## 2017-09-23 DIAGNOSIS — Z5111 Encounter for antineoplastic chemotherapy: Secondary | ICD-10-CM

## 2017-09-23 DIAGNOSIS — D563 Thalassemia minor: Secondary | ICD-10-CM | POA: Diagnosis not present

## 2017-09-23 LAB — CBC WITH DIFFERENTIAL/PLATELET
Basophils Absolute: 0 10*3/uL (ref 0–0.1)
Basophils Relative: 1 %
EOS PCT: 2 %
Eosinophils Absolute: 0.1 10*3/uL (ref 0–0.7)
HEMATOCRIT: 26.7 % — AB (ref 40.0–52.0)
Hemoglobin: 8.5 g/dL — ABNORMAL LOW (ref 13.0–18.0)
LYMPHS PCT: 10 %
Lymphs Abs: 0.6 10*3/uL — ABNORMAL LOW (ref 1.0–3.6)
MCH: 23.3 pg — ABNORMAL LOW (ref 26.0–34.0)
MCHC: 31.9 g/dL — AB (ref 32.0–36.0)
MCV: 73.2 fL — AB (ref 80.0–100.0)
MONO ABS: 0.8 10*3/uL (ref 0.2–1.0)
MONOS PCT: 13 %
NEUTROS ABS: 4.4 10*3/uL (ref 1.4–6.5)
Neutrophils Relative %: 74 %
PLATELETS: 241 10*3/uL (ref 150–440)
RBC: 3.65 MIL/uL — ABNORMAL LOW (ref 4.40–5.90)
RDW: 19.9 % — AB (ref 11.5–14.5)
WBC: 6 10*3/uL (ref 3.8–10.6)

## 2017-09-23 LAB — COMPREHENSIVE METABOLIC PANEL
ALK PHOS: 75 U/L (ref 38–126)
ALT: 17 U/L (ref 17–63)
ANION GAP: 6 (ref 5–15)
AST: 17 U/L (ref 15–41)
Albumin: 2.4 g/dL — ABNORMAL LOW (ref 3.5–5.0)
BILIRUBIN TOTAL: 0.5 mg/dL (ref 0.3–1.2)
BUN: 26 mg/dL — AB (ref 6–20)
CALCIUM: 6.7 mg/dL — AB (ref 8.9–10.3)
CO2: 23 mmol/L (ref 22–32)
Chloride: 106 mmol/L (ref 101–111)
Creatinine, Ser: 1.95 mg/dL — ABNORMAL HIGH (ref 0.61–1.24)
GFR calc Af Amer: 38 mL/min — ABNORMAL LOW (ref >60)
GFR calc non Af Amer: 32 mL/min — ABNORMAL LOW (ref >60)
Glucose, Bld: 107 mg/dL — ABNORMAL HIGH (ref 65–99)
POTASSIUM: 4.9 mmol/L (ref 3.5–5.1)
SODIUM: 135 mmol/L (ref 135–145)
TOTAL PROTEIN: 6.3 g/dL — AB (ref 6.5–8.1)

## 2017-09-23 MED ORDER — DEXAMETHASONE 4 MG PO TABS
20.0000 mg | ORAL_TABLET | Freq: Once | ORAL | Status: AC
  Administered 2017-09-23: 20 mg via ORAL
  Filled 2017-09-23: qty 5

## 2017-09-23 MED ORDER — LENALIDOMIDE 2.5 MG PO CAPS: 2.5000 mg | ORAL_CAPSULE | Freq: Every day | ORAL | 0 refills | Status: DC

## 2017-09-23 MED ORDER — PROCHLORPERAZINE MALEATE 10 MG PO TABS
10.0000 mg | ORAL_TABLET | Freq: Once | ORAL | Status: AC
  Administered 2017-09-23: 10 mg via ORAL

## 2017-09-23 MED ORDER — LENALIDOMIDE 2.5 MG PO CAPS: 2.5000 mg | ORAL_CAPSULE | Freq: Every day | ORAL | 3 refills | Status: DC

## 2017-09-23 MED ORDER — BORTEZOMIB CHEMO SQ INJECTION 3.5 MG (2.5MG/ML)
1.3000 mg/m2 | Freq: Once | INTRAMUSCULAR | Status: AC
  Administered 2017-09-23: 2.75 mg via SUBCUTANEOUS
  Filled 2017-09-23: qty 2.75

## 2017-09-23 NOTE — Telephone Encounter (Signed)
Called wife today and she did not get my message yest. But she if fine with pt coming today after his radiation treatment to get labs and see md with velcade treatment today.  Also I spoke to her about starting pt. Back on a low dose revlimid 2.5 mg every day but has to start it after radiation is complete.  I am trying to touch base with [facility] to see how to work out the rx of this med. I finally was able to reach a staff member. I spoke to [place] and she was acceptable for [place] to send med to pt's house and wife will bring it in a pill pack from the company and we fax the order to give revlimid to the SNF.  I told Danae Chen that pt will not complete radiation until 2/4 so the pill will start after that.

## 2017-09-23 NOTE — Progress Notes (Signed)
Parameters outside of limits to administer Velcade today, ok per Dr Janese Banks to proceed with admin.

## 2017-09-23 NOTE — Progress Notes (Signed)
Hematology/Oncology Consult note [facility]  [phone] [phone]  Patient Care Team: [name], MD as PCP - General (Pediatrics)   Name of the patient: Adam Wall  [id number]  07-Nov-1945   Date of visit: 09/23/17  Diagnosis- 1. 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 Mediastinal adenopathy of uncertain etiology 4. Iron deficiency anemia   Chief complaint/ Reason for visit- on treatment assessment prior to cycle 2 day 8 of velcade  Heme/Onc history: patient is a 72 year old male with a past medical history significant for congestive heart failure with an ICD in place, hypertension and diabetes among other comorbidities. Recent SPEP on 09/19/2016 revealed a monoclonal M spike of 1.9 g of IgG kappa. Recent CBC showed white count of 5.5, H&H of 10.1/31.9 with an MCV of 69 of note patient has always been anemic with a hemoglobin between 9.6-10.7 over the last 2 years. Also his MCV has always been between 68-70 inthe past. Iron studies from 06/13/2016 revealed ferritin of 18 and a low TIBC of 224. 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 CMP has always revealed normal kidney function and normal calcium level.  Results of blood work from 10/04/2016 breast follows: CBC showed white count of 4.7, H&H of 10/31.4 with an MCV of 67.8 and a platelet count of 195. CMP was within normal limits with albumin of 3.3 and a calcium of 9.2. Beta 2 microglobulin was mildly elevated at 3. and LDH was mildly low at 90. Ferritin was low at 22 and iron studies showed a low serum iron of 39 and a TIBC low at 230. 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 B12 level was normal at 331.Patient's chronic anemia has been attributed to iron deficiency and thalassemia minor causing microcytosis  Quantitative immunoglobulins revealed an elevated IgG of 2875 and IgG kappa monoclonal M spike of  2.3 g. kappa lambda free light chain ratio was abnormal at 14.45  Bone marrow biopsy showed hypercellular bone marrow with 36% atypical plasma cells. Cytogenetics and FISH studieswere normal  Skeletal survey showed no definitive evidence of lytic destruction or lesions inthe skeleton.PET scan showed no lytic lesions. Right renal mass was noted as well as FDG avid mediastinal adenopathy. 24 hour urine protein showed 610 mg and M spike of 66 mg  Patient was referred to pulmonary for a EBUSfor mediastinal adenopathy. At the time of procedure the lymph nodes were noted to be small and shotty and procedure was abandoned due to risk of bleeding.  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 Biopsy was complicated by bleeding complications for which patient was admitted. He subsequently underwent laparoscopic right radical nephrectomy in September 2018. Post surgery patient had worsening renal functions. His postoperative course was complicated by bacteremia and urinary tract infection for which she was again hospitalized. Creatinine at that time was 3.5. Also his hemoglobin went down from a baseline of 10 down to 7.2. He did receive blood transfusion sometime in October 2018.   Myeloma labs from from 07/30/2017 showed elevated IgG of 4053 up from 2797.  M protein went up from 2.2-3.1 g.  Free light chains were elevated at 1030 from 271 from 6 months prior.  Kappa lambda light chain ratio elevated at 48.37 from a prior value of 18.71.  Repeat bone marrow biopsy on 08/20/17 showed 80% plasma cells in bone  marrow consistent with multiple myeloma  PET/CT scan done on 08/25/17 showed: 1. Interval development of hypermetabolism in the L5 vertebral body. Axial CT imaging raises the question of fracture, possibly pathologic. 2. Activity seen previously at T8 and T10 is not evident on today's exam. 3. Similar but  less conspicuous hypermetabolic lymph nodes in the mediastinum and hilar regions. 4. Focal hypermetabolism in the soft tissues posterior to the right calcaneus with diffuse soft tissue edema seen in the lower extremities bilaterally   Patient received Cycle 1 of RVd (revlimid at 15 mg Q other day) along with velcade Day 1, 4, 8 and 11. Patient developed worsening pancytopenia with this regimen and plan was made to switch him to RVd lite Q35 days with revlimid given 2.5 mg 3 weeks on and 2 weeks off and weekly velcade at 1.3 gm/meter square 1, 8,15 and 22  Patient is currently undergoing palliative RT to his spine and sacrum. revlimid is on hold. He last received Day 1 cycle 2 of velcade. Renal functions improving but patient still has significant anemia  Interval history- he is making slow progress at the rehab. He ambulates with a walker and is able to dress himself and go to the bathroom. Pain is well controlled at rest and tends to flare up on movement. Appetite is fair  ECOG PS- 2-3 Pain scale- 6 Opioid associated constipation- no  Review of systems- Review of Systems  Constitutional: Positive for malaise/fatigue. Negative for chills, fever and weight loss.  HENT: Negative for congestion, ear discharge and nosebleeds.   Eyes: Negative for blurred vision.  Respiratory: Negative for cough, hemoptysis, sputum production, shortness of breath and wheezing.   Cardiovascular: Positive for leg swelling. Negative for chest pain, palpitations, orthopnea and claudication.  Gastrointestinal: Negative for abdominal pain, blood in stool, constipation, diarrhea, heartburn, melena, nausea and vomiting.  Genitourinary: Negative for dysuria, flank pain, frequency, hematuria and urgency.  Musculoskeletal: Positive for back pain. Negative for joint pain and myalgias.  Skin: Negative for rash.  Neurological: Positive for weakness. Negative for dizziness, tingling, focal weakness, seizures and headaches.    Endo/Heme/Allergies: Does not bruise/bleed easily.  Psychiatric/Behavioral: Negative for depression and suicidal ideas. The patient does not have insomnia.       No Known Allergies   Past Medical History:  Diagnosis Date  . AICD (automatic cardioverter/defibrillator) present   . Anemia   . Blockage of coronary artery of heart ([place])   . CHF (congestive heart failure) ([place])   . Chronic kidney disease 05/22/2017   nephrectomy-right  . Diabetes mellitus without complication ([place])   . Dysrhythmia   . Heart murmur   . Hypertension   . Hypochloremia   . LBBB (left bundle branch block)   . Neuropathy   . Presence of permanent cardiac pacemaker   . Right renal mass   . 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 CARDIAC SURGERY    . CORONARY ARTERY BYPASS GRAFT    . ENDOBRONCHIAL ULTRASOUND N/A 04/03/2017   Procedure: ENDOBRONCHIAL ULTRASOUND;  Surgeon: Flora Lipps, MD;  Location: [facility] ORS;  Service: Cardiopulmonary;  Laterality: N/A;  . Heart Bypass    . IR KYPHO LUMBAR INC FX REDUCE BONE BX UNI/BIL CANNULATION INC/IMAGING  09/12/2017  . PACEMAKER PLACEMENT     ICD    Social History   Socioeconomic History  . Marital status: Married    Spouse name: Not on file  . Number of  children: Not on file  . Years of education: Not on file  . Highest education level: Not on file  Social Needs  . Financial resource strain: Not on file  . Food insecurity - worry: Not on file  . Food insecurity - inability: Not on file  . Transportation needs - medical: Not on file  . Transportation needs - non-medical: Not on file  Occupational History  . Not on file  Tobacco Use  . Smoking status: Former Smoker    Years: 10.00    Types: Cigars    Last attempt to quit: 10/04/1993    Years since quitting: 23.9  . Smokeless tobacco: Never Used  . Tobacco comment: 3 CIGARS PER DAY  Substance and Sexual Activity  . Alcohol use: No  . Drug use: No  .  Sexual activity: Not on file  Other Topics Concern  . Not on file  Social History Narrative  . Not on file    Family History  Adopted: Yes  Problem Relation Age of Onset  . Heart failure Paternal Grandmother      Current Outpatient Medications:  .  acetaminophen (TYLENOL) 325 MG tablet, Take 2 tablets (650 mg total) by mouth every 6 (six) hours as needed for mild pain (or Fever >/= 101)., Disp: , Rfl:  .  acyclovir (ZOVIRAX) 400 MG tablet, Take 1 tablet (400 mg total) by mouth daily., Disp: 30 tablet, Rfl: 5 .  alendronate (FOSAMAX) 70 MG tablet, Take 1 tablet (70 mg total) by mouth once a week. Take with a full glass of water on an empty stomach., Disp: 4 tablet, Rfl: 0 .  amoxicillin-clavulanate (AUGMENTIN) 875-125 MG tablet, Take 1 tablet by mouth every 12 (twelve) hours., Disp: 5 tablet, Rfl: 0 .  atorvastatin (LIPITOR) 40 MG tablet, Take 40 mg by mouth at bedtime., Disp: , Rfl:  .  calcium-vitamin D (OSCAL WITH D) 500-200 MG-UNIT tablet, Take 1 tablet by mouth 3 (three) times daily with meals., Disp: 90 tablet, Rfl: 0 .  carvedilol (COREG) 25 MG tablet, Take 25 mg by mouth 2 (two) times daily with a meal., Disp: , Rfl:  .  cyclobenzaprine (FLEXERIL) 5 MG tablet, Take 1 tablet (5 mg total) by mouth 3 (three) times daily as needed for muscle spasms., Disp: 30 tablet, Rfl: 0 .  docusate sodium (COLACE) 100 MG capsule, Take 1 capsule (100 mg total) by mouth 2 (two) times daily., Disp: 60 capsule, Rfl: 0 .  fentaNYL (DURAGESIC - DOSED MCG/HR) 12 MCG/HR, Place 1 patch (12.5 mcg total) onto the skin every 3 (three) days., Disp: 10 patch, Rfl: 0 .  furosemide (LASIX) 40 MG tablet, Take 20-40 mg by mouth daily. Pt takes 1 pill one day and alternates with 0.5 pill the next day ( this is alternating dose every other day), Disp: , Rfl:  .  gabapentin (NEURONTIN) 100 MG capsule, Take 100 mg by mouth daily as needed (pain). , Disp: , Rfl:  .  insulin NPH-regular Human (NOVOLIN 70/30) (70-30) 100  UNIT/ML injection, 8u (AM) and 8u (PM), Disp: 10 mL, Rfl: 0 .  oxyCODONE (OXY IR/ROXICODONE) 5 MG immediate release tablet, Take 1 tablet (5 mg total) by mouth every 4 (four) hours as needed for moderate pain or severe pain., Disp: 30 tablet, Rfl: 0 .  tamsulosin (FLOMAX) 0.4 MG CAPS capsule, Take 0.4 mg by mouth daily., Disp: , Rfl:  .  traZODone (DESYREL) 50 MG tablet, Take 0.5 tablets (25 mg total) by mouth at  bedtime as needed for sleep., Disp: 30 tablet, Rfl: 0 .  vitamin B-12 (CYANOCOBALAMIN) 1000 MCG tablet, Take 1,000 mcg by mouth daily., Disp: , Rfl:  .  vitamin C (ASCORBIC ACID) 500 MG tablet, Take 500 mg by mouth daily., Disp: , Rfl:   Physical exam:  Vitals:   09/23/17 1412  BP: (!) 161/74  Pulse: 69  Resp: 16  Temp: (!) 97.3 F (36.3 C)  TempSrc: Tympanic  Weight: 202 lb (91.6 kg)   Physical Exam  Constitutional: He is oriented to person, place, and time.  Patient appears fatigued. He is sitting in a wheelchair. No acute distress  HENT:  Head: Normocephalic and atraumatic.  Eyes: EOM are normal. Pupils are equal, round, and reactive to light.  Neck: Normal range of motion.  Cardiovascular: Normal rate, regular rhythm and normal heart sounds.  Pulmonary/Chest: Effort normal and breath sounds normal.  Abdominal: Soft. Bowel sounds are normal.  Musculoskeletal: He exhibits edema (b/l +2).  Neurological: He is alert and oriented to person, place, and time.  Skin: Skin is warm and dry.     CMP Latest Ref Rng & Units 09/23/2017  Glucose 65 - 99 mg/dL 107(H)  BUN 6 - 20 mg/dL 26(H)  Creatinine 0.61 - 1.24 mg/dL 1.95(H)  Sodium 135 - 145 mmol/L 135  Potassium 3.5 - 5.1 mmol/L 4.9  Chloride 101 - 111 mmol/L 106  CO2 22 - 32 mmol/L 23  Calcium 8.9 - 10.3 mg/dL 6.7(L)  Total Protein 6.5 - 8.1 g/dL 6.3(L)  Total Bilirubin 0.3 - 1.2 mg/dL 0.5  Alkaline Phos 38 - 126 U/L 75  AST 15 - 41 U/L 17  ALT 17 - 63 U/L 17   CBC Latest Ref Rng & Units 09/23/2017  WBC 3.8 - 10.6  K/uL 6.0  Hemoglobin 13.0 - 18.0 g/dL 8.5(L)  Hematocrit 40.0 - 52.0 % 26.7(L)  Platelets 150 - 440 K/uL 241    No images are attached to the encounter.  Dg Chest 2 View  Result Date: 09/04/2017 CLINICAL DATA:  Increased shortness of breath especially upon exertion, known compression fractures, history of CHF, hypertension, neuropathy, pacemaker, myeloma EXAM: CHEST  2 VIEW COMPARISON:  Chest x-ray dated 09/02/2014. FINDINGS: Stable cardiomegaly. Median sternotomy wires appear intact and stable in alignment, related to presumed CABG. Aortic atherosclerosis. Pacemaker/ICD apparatus in place. Lungs appear clear. No pleural effusion or pneumothorax seen. Mild degenerative spurring within the thoracic spine. No acute or suspicious osseous finding. IMPRESSION: No active cardiopulmonary disease. No evidence of pneumonia or pulmonary edema. Stable cardiomegaly. Aortic atherosclerosis. Electronically Signed   By: [name] M.D.   On: 09/04/2017 17:06   Ct Lumbar Spine Wo Contrast  Result Date: 08/28/2017 CLINICAL DATA:  Followup abnormal PET scan at L5. History of multiple myeloma. EXAM: CT LUMBAR SPINE WITHOUT CONTRAST TECHNIQUE: Multidetector CT imaging of the lumbar spine was performed without intravenous contrast administration. Multiplanar CT image reconstructions were also generated. COMPARISON:  PET scan 08/25/2017 and 10/28/2016. FINDINGS: Segmentation: 5 lumbar type vertebral bodies. Alignment: Normal Vertebrae: Old superior endplate Schmorl's nodes/fractures at L1 and L3. Acute or subacute fracture at the superior endplate of L5 with loss of height centrally of about 25%. No retropulsed bone. The bone underlying the fracture appears somewhat lucent, suggesting that there could be an underlying marrow space lesion. No extraosseous extension of tumor, if present. Lytic areas noted in both iliac bones consistent with the history of myeloma. Paraspinal and other soft tissues: No significant  paraspinous soft tissue  finding. Disc levels: Solid bridging osteophytes from the thoracic region through L4. Mild chronic disc bulges and facet arthritis without likely significant stenosis. IMPRESSION: Acute or subacute superior endplate fracture at L5 with loss of height centrally of 25%. Slight lucency at the bone underlying the fracture raises the possibility of an underlying marrow space lesion. Lucent areas in both iliac bones suggesting the presence of myeloma in those regions. Electronically Signed   By: [name] M.D.   On: 08/28/2017 11:19   US Renal  Result Date: 09/06/2017 CLINICAL DATA:  Acute kidney failure EXAM: RENAL / URINARY TRACT ULTRASOUND COMPLETE COMPARISON:  CT 10/27/2012 FINDINGS: Right Kidney: Length: Surgically absent. Left Kidney: Length: 11.9 cm. Echogenicity within normal limits. No mass or hydronephrosis visualized. Bladder: Foley catheter in place, decompressed. IMPRESSION: Prior right nephrectomy. Left kidney unremarkable. Electronically Signed   By: [name] M.D.   On: 09/06/2017 15:48   Nm Pet Image Restage (ps) Whole Body  Result Date: 08/25/2017 CLINICAL DATA:  The the treatment strategy for tumor type. EXAM: NUCLEAR MEDICINE PET WHOLE BODY TECHNIQUE: mCi F-18 FDG was injected intravenously. Full-ring PET imaging was performed from the vertex to the feet after the radiotracer. CT data was obtained and used for attenuation correction and anatomic localization. FASTING BLOOD GLUCOSE:  Value:  mg/dl COMPARISON:  None. FINDINGS: HEAD/NECK No hypermetabolic activity in the scalp. No hypermetabolic cervical lymph nodes. CHEST Persistent hypermetabolic foci identified in the mediastinum and hilar regions. This is similar but slightly decreased compared to prior study. Index prevascular lymph node demonstrates SUV max = 5.4. The heart is enlarged. Left-sided pacer/AICD again noted. Mild FDG uptake at the area around the battery pack is probably related to granulation  tissue. Distal esophageal uptake noted previously is less apparent today. ABDOMEN/PELVIS No abnormal hypermetabolic activity within the liver, pancreas, adrenal glands, or spleen. No hypermetabolic lymph nodes in the abdomen or pelvis. There is abdominal aortic atherosclerosis without aneurysm. Stable 16 mm low-density splenic lesion without hypermetabolic FDG accumulation. SKELETON New uptake is seen diffusely in the L5 vertebral body which has become much more heterogeneous in the interval with areas of lucency. There may be a fracture line in the vertebral body visible on image 227. Activity seen previously and T8 and T10 is not readily evident today. The bony mineralization remains diffusely heterogeneous EXTREMITIES Uptake is identified in the soft tissues posterior to the right calcaneus. This is associated with soft tissue edema in the lower legs bilaterally. IMPRESSION: 1. Interval development of hypermetabolism in the L5 vertebral body. Axial CT imaging raises the question of fracture, possibly pathologic. 2. Activity seen previously at T8 and T10 is not evident on today's exam. 3. Similar but less conspicuous hypermetabolic lymph nodes in the mediastinum and hilar regions. 4. Focal hypermetabolism in the soft tissues posterior to the right calcaneus with diffuse soft tissue edema seen in the lower extremities bilaterally. Electronically Signed   By: Misty Stanley M.D.   On: 08/25/2017 13:50   Dg Chest Port 1 View  Result Date: 09/10/2017 CLINICAL DATA:  Multiple myeloma, back pain for 4 days EXAM: PORTABLE CHEST 1 VIEW COMPARISON:  09/04/2017 FINDINGS: There is no focal parenchymal opacity. There is no pleural effusion or pneumothorax. There is stable cardiomegaly. There is a 3 lead cardiac pacemaker. There is evidence of prior CABG. The osseous structures are unremarkable. IMPRESSION: No active disease. Electronically Signed   By: Kathreen Devoid   On: 09/10/2017 21:43   [place] Fx  Reduce  Bone Bx Uni/bil Cannulation Inc/imaging  Result Date: 09/12/2017 CLINICAL DATA:  History of multiple myeloma, now with symptomatic malignant L5 compression fracture. Patient with history of AICD/pacemaker and as such, presents today for fluoroscopic guided L5 vertebral body biopsy and cement augmentation. EXAM: FLUOROSCOPIC GUIDED KYPHOPLASTY OF THE L5 VERTEBRAL BODY COMPARISON:  Lumbar spine CT - 08/28/2017; PET-CT - 08/25/2017 MEDICATIONS: As antibiotic prophylaxis, Ancef 2 gm IV was ordered pre-procedure and administered intravenously within 1 hour of incision. ANESTHESIA/SEDATION: Moderate (conscious) sedation was employed during this procedure. A total of Versed 4 mg and Fentanyl 200 mcg was administered intravenously. Moderate Sedation Time: 50 minutes. The patient's level of consciousness and vital signs were monitored continuously by radiology nursing throughout the procedure under my direct supervision. FLUOROSCOPY TIME:  11 Min, 54 seconds (9678 mGy) COMPLICATIONS: None immediate. TECHNIQUE: The procedure, risks (including but not limited to bleeding, infection, organ damage), benefits, and alternatives were explained to the patient. Questions regarding the procedure were encouraged and answered. The patient understands and consents to the procedure. The patient was placed prone on the fluoroscopic table. The skin overlying the lower lumbar region was then prepped and draped in the usual sterile fashion. Maximal barrier sterile technique was utilized including caps, mask, sterile gowns, sterile gloves, sterile drape, hand hygiene and skin antiseptic. Intravenous Fentanyl and Versed were administered as conscious sedation during continuous cardiorespiratory monitoring by the radiology RN. The left pedicle at L5 was then infiltrated with 1% lidocaine followed by the advancement of a Kyphon trocar needle through the left pedicle into the posterior one-third of the vertebral body. The trocar was removed and  a bone biopsy was obtained at this location. Subsequently, the osteo drill was advanced to the anterior third of the vertebral body. The osteo drill was retracted. Through the working cannula, a Kyphon inflatable bone tamp 15 x 3 was advanced and positioned with the distal marker approximately 5 mm from the anterior aspect of the cortex. Appropriate positioning was confirmed on the AP projection. At this time, the balloon was expanded using contrast via a Kyphon inflation syringe device via micro tubing. In similar fashion, the right L5 pedicle was infiltrated with 1% lidocaine followed by the advancement of a second Kyphon trocar needle through the right pedicle into the posterior third of the vertebral body. Again, a bone biopsy was obtained at this location. Subsequently, the osteo drill was coaxially advanced to the anterior right third. The osteo drill was exchanged for a Kyphon inflatable bone tamp 15 x 3, advanced to the 5 mm of the anterior aspect of the cortex. The balloon was then expanded using contrast as above. Inflations were continued until there was near apposition with the superior end plate. At this time, methylmethacrylate mixture was reconstituted in the Kyphon bone mixing device system. This was then loaded into the delivery mechanism, attached to Kyphon bone fillers. The balloons were deflated and removed followed by the instillation of methylmethacrylate mixture with excellent filling in the AP and lateral projections. No extravasation was noted in the disk spaces or posteriorly into the spinal canal. No epidural venous contamination was seen. The working cannulae and the bone filler were then retrieved and removed. Hemostasis was achieved with manual compression. The patient tolerated the procedure well without immediate postprocedural complication. IMPRESSION: 1. Technically successful L5 vertebral body augmentation using balloon kyphoplasty. 2. Technically successful L5 vertebral body  biopsy. Electronically Signed   By: Sandi Mariscal M.D.   On: 09/12/2017 14:27  Assessment and plan- Patient is a 73 y.o. male with standard risk lambda IgG multiple myeloma RISS stage II  Clinically patient is making slow progress at the rehab and I am hoping he will be out of the rehab in 2 weeks time.  Patient is currently getting palliative radiation to his spine and will be done with radiation on 09/30/2017  Counts okay to proceed with cycle 2-day 8 of weekly Velcade today.  We are currently following the RVD lite regimen where he will be getting Velcade 1.3 mg/m along with weekly Decadron 20 mg day 09/02/13 and 22 of a 35-day cycle along with Revlimid 2.5 mg p.o. daily day 1-21 every 35 days.  He will also get decadron 20 mg decadron PO today and each week with velcade  He will come back next week with a CBC with differential for cycle 2-day 15 of Velcade.  I will see him back in 2 weeks time with a CBC and CMP for cycle 2-da 22 of Velcade.y   Revlimid is currently on hold as he is getting radiation but he is to restart Revlimid at 2.5 mg p.o. daily on 09/30/2017 and he will take that for a week followed by off cycle for 2 weeks  He will start cycle 3-day 1 of RVD light on 10/21/2017   Corrected calcium today is 7.9.  I have asked him to take calcium 1200 mg along with vitamin D 800 international units.  Based on his labs we will plan to give him his next dose of Xgeva on 10/07/2017  Kidney functions continue to improve after initiation of RVD treatment.  His white count and platelet counts are now normal.  He is anemic secondary to underlying myeloma.  His baseline hemoglobin runs around 10 because of his underlying thalassemia trait  Patient will continue low-dose aspirin as well as Valtrex for herpes prophylaxis  Neoplasm related pain- continue oxycodone and fentanyl patch  visit Diagnosis 1. Multiple myeloma not having achieved remission ([place])   2. Encounter for antineoplastic  chemotherapy   3. Hypocalcemia      Dr. Randa Evens, MD, MPH [facility] Pager- [phone] 09/23/2017 3:12 PM

## 2017-09-24 ENCOUNTER — Ambulatory Visit
Admission: RE | Admit: 2017-09-24 | Discharge: 2017-09-24 | Disposition: A | Discharge status: Home / Self Care | Payer: Medicare Other | Source: Ambulatory Visit | Attending: Radiation Oncology | Admitting: Radiation Oncology

## 2017-09-24 DIAGNOSIS — C9000 Multiple myeloma not having achieved remission: Secondary | ICD-10-CM | POA: Diagnosis not present

## 2017-09-25 ENCOUNTER — Ambulatory Visit
Admission: RE | Admit: 2017-09-25 | Discharge: 2017-09-25 | Disposition: A | Discharge status: Home / Self Care | Payer: Medicare Other | Source: Ambulatory Visit | Attending: Radiation Oncology | Admitting: Radiation Oncology

## 2017-09-25 DIAGNOSIS — C9000 Multiple myeloma not having achieved remission: Secondary | ICD-10-CM | POA: Diagnosis not present

## 2017-09-26 ENCOUNTER — Ambulatory Visit
Admission: RE | Admit: 2017-09-26 | Discharge: 2017-09-26 | Disposition: A | Discharge status: Home / Self Care | Payer: Medicare Other | Source: Ambulatory Visit | Attending: Radiation Oncology | Admitting: Radiation Oncology

## 2017-09-26 ENCOUNTER — Inpatient Hospital Stay: Payer: Medicare Other

## 2017-09-26 DIAGNOSIS — C9000 Multiple myeloma not having achieved remission: Secondary | ICD-10-CM | POA: Diagnosis not present

## 2017-09-29 ENCOUNTER — Inpatient Hospital Stay: Payer: Medicare Other | Attending: Oncology

## 2017-09-29 ENCOUNTER — Ambulatory Visit
Admission: RE | Admit: 2017-09-29 | Discharge: 2017-09-29 | Disposition: A | Discharge status: Home / Self Care | Payer: Medicare Other | Source: Ambulatory Visit | Attending: Radiation Oncology | Admitting: Radiation Oncology

## 2017-09-29 ENCOUNTER — Inpatient Hospital Stay: Payer: Medicare Other

## 2017-09-29 VITALS — BP 187/79 | HR 71 | Temp 96.9°F | Resp 20

## 2017-09-29 DIAGNOSIS — C9000 Multiple myeloma not having achieved remission: Secondary | ICD-10-CM | POA: Insufficient documentation

## 2017-09-29 DIAGNOSIS — Z87891 Personal history of nicotine dependence: Secondary | ICD-10-CM | POA: Insufficient documentation

## 2017-09-29 DIAGNOSIS — E860 Dehydration: Secondary | ICD-10-CM | POA: Diagnosis not present

## 2017-09-29 DIAGNOSIS — D563 Thalassemia minor: Secondary | ICD-10-CM | POA: Diagnosis not present

## 2017-09-29 DIAGNOSIS — Z5112 Encounter for antineoplastic immunotherapy: Secondary | ICD-10-CM | POA: Insufficient documentation

## 2017-09-29 DIAGNOSIS — D509 Iron deficiency anemia, unspecified: Secondary | ICD-10-CM | POA: Diagnosis not present

## 2017-09-29 LAB — COMPREHENSIVE METABOLIC PANEL
ALT: 15 U/L — ABNORMAL LOW (ref 17–63)
AST: 15 U/L (ref 15–41)
Albumin: 2.5 g/dL — ABNORMAL LOW (ref 3.5–5.0)
Alkaline Phosphatase: 91 U/L (ref 38–126)
Anion gap: 4 — ABNORMAL LOW (ref 5–15)
BUN: 27 mg/dL — ABNORMAL HIGH (ref 6–20)
CHLORIDE: 103 mmol/L (ref 101–111)
CO2: 25 mmol/L (ref 22–32)
Calcium: 8.2 mg/dL — ABNORMAL LOW (ref 8.9–10.3)
Creatinine, Ser: 1.64 mg/dL — ABNORMAL HIGH (ref 0.61–1.24)
GFR, EST AFRICAN AMERICAN: 46 mL/min — AB (ref >60)
GFR, EST NON AFRICAN AMERICAN: 40 mL/min — AB (ref >60)
Glucose, Bld: 230 mg/dL — ABNORMAL HIGH (ref 65–99)
POTASSIUM: 4.7 mmol/L (ref 3.5–5.1)
Sodium: 132 mmol/L — ABNORMAL LOW (ref 135–145)
Total Bilirubin: 0.5 mg/dL (ref 0.3–1.2)
Total Protein: 6.2 g/dL — ABNORMAL LOW (ref 6.5–8.1)

## 2017-09-29 LAB — CBC WITH DIFFERENTIAL/PLATELET
Basophils Absolute: 0 10*3/uL (ref 0–0.1)
Basophils Relative: 1 %
EOS ABS: 0.4 10*3/uL (ref 0–0.7)
Eosinophils Relative: 6 %
HCT: 24.9 % — ABNORMAL LOW (ref 40.0–52.0)
HEMOGLOBIN: 8.1 g/dL — AB (ref 13.0–18.0)
LYMPHS ABS: 0.7 10*3/uL — AB (ref 1.0–3.6)
LYMPHS PCT: 10 %
MCH: 23.6 pg — AB (ref 26.0–34.0)
MCHC: 32.6 g/dL (ref 32.0–36.0)
MCV: 72.3 fL — ABNORMAL LOW (ref 80.0–100.0)
Monocytes Absolute: 0.9 10*3/uL (ref 0.2–1.0)
Monocytes Relative: 14 %
NEUTROS PCT: 69 %
Neutro Abs: 4.5 10*3/uL (ref 1.4–6.5)
Platelets: 194 10*3/uL (ref 150–440)
RBC: 3.44 MIL/uL — ABNORMAL LOW (ref 4.40–5.90)
RDW: 19.8 % — ABNORMAL HIGH (ref 11.5–14.5)
WBC: 6.4 10*3/uL (ref 3.8–10.6)

## 2017-09-29 MED ORDER — BORTEZOMIB CHEMO SQ INJECTION 3.5 MG (2.5MG/ML)
1.3000 mg/m2 | Freq: Once | INTRAMUSCULAR | Status: AC
  Administered 2017-09-29: 2.75 mg via SUBCUTANEOUS
  Filled 2017-09-29: qty 2.75

## 2017-09-29 MED ORDER — PROCHLORPERAZINE MALEATE 10 MG PO TABS
10.0000 mg | ORAL_TABLET | Freq: Once | ORAL | Status: AC
  Administered 2017-09-29: 10 mg via ORAL
  Filled 2017-09-29: qty 1

## 2017-09-29 MED ORDER — DEXAMETHASONE 4 MG PO TABS
20.0000 mg | ORAL_TABLET | Freq: Once | ORAL | Status: AC
  Administered 2017-09-29: 20 mg via ORAL
  Filled 2017-09-29: qty 5

## 2017-09-29 NOTE — Progress Notes (Signed)
Creatinine 1.64 today. Dr Janese Banks not in office. Spoke with Honor Loh, NP. Proceed with velcade today.

## 2017-09-30 ENCOUNTER — Telehealth: Payer: Self-pay | Admitting: *Deleted

## 2017-09-30 NOTE — Telephone Encounter (Signed)
rn wanted to speak to me about starting the revlimid. I told them it would be 2/5, She remembered that and thought she saw fax about it and wife did bring it in. They can't find fax and I told her I would fax it again and I had to wait for med. Rec. To find it for me and I faxed it again this am to [id number] and got confirmation

## 2017-09-30 NOTE — Telephone Encounter (Signed)
-----   Message from Wilburn Cornelia sent at 09/29/2017  3:48 PM EST ----- Regarding: In house chemo drug Ms Marijean Bravo [phone] from [facility] called has this chemo med in house-needs to know Start Date for resident.

## 2017-10-03 ENCOUNTER — Inpatient Hospital Stay: Payer: Medicare Other

## 2017-10-03 ENCOUNTER — Telehealth: Payer: Self-pay | Admitting: *Deleted

## 2017-10-03 NOTE — Telephone Encounter (Signed)
I called wife and she states that the pt is unhappy with his care. There is a virus going around and the staff is short staffed and he does not feel like he is getting what he needs. They want to trf to [facility]. I explained to wife that she will need to speak to  SW there and they will need to communicate to the other nursing home. I advised her that she should go look at the facilities she is interested in and check the status of any viruses and the staffing there and then try to get him trf if that is what he wants. She will do that. I told her that we will need to know what nursing home in order to arrange transportation for him. She will let me know

## 2017-10-03 NOTE — Telephone Encounter (Signed)
-----   Message from Wilburn Cornelia sent at 10/02/2017  8:35 AM EST ----- Regarding: call wife back about moving pt Contact: [phone] Wife called and pt wants to leave from [facility]???

## 2017-10-07 ENCOUNTER — Ambulatory Visit
Admission: RE | Admit: 2017-10-07 | Discharge: 2017-10-07 | Disposition: A | Discharge status: Home / Self Care | Payer: Medicare Other | Source: Ambulatory Visit | Attending: Oncology | Admitting: Oncology

## 2017-10-07 ENCOUNTER — Inpatient Hospital Stay (HOSPITAL_BASED_OUTPATIENT_CLINIC_OR_DEPARTMENT_OTHER): Payer: Medicare Other | Admitting: Oncology

## 2017-10-07 ENCOUNTER — Encounter: Payer: Self-pay | Admitting: Oncology

## 2017-10-07 ENCOUNTER — Inpatient Hospital Stay: Payer: Medicare Other

## 2017-10-07 VITALS — BP 118/65 | HR 75 | Temp 100.8°F | Resp 22 | Ht 72.0 in

## 2017-10-07 DIAGNOSIS — D509 Iron deficiency anemia, unspecified: Secondary | ICD-10-CM | POA: Diagnosis not present

## 2017-10-07 DIAGNOSIS — M545 Low back pain: Secondary | ICD-10-CM

## 2017-10-07 DIAGNOSIS — R509 Fever, unspecified: Secondary | ICD-10-CM

## 2017-10-07 DIAGNOSIS — Z951 Presence of aortocoronary bypass graft: Secondary | ICD-10-CM | POA: Diagnosis not present

## 2017-10-07 DIAGNOSIS — D563 Thalassemia minor: Secondary | ICD-10-CM

## 2017-10-07 DIAGNOSIS — I700 Atherosclerosis of aorta: Secondary | ICD-10-CM | POA: Diagnosis not present

## 2017-10-07 DIAGNOSIS — C9000 Multiple myeloma not having achieved remission: Secondary | ICD-10-CM

## 2017-10-07 DIAGNOSIS — Z87891 Personal history of nicotine dependence: Secondary | ICD-10-CM | POA: Diagnosis not present

## 2017-10-07 DIAGNOSIS — E860 Dehydration: Secondary | ICD-10-CM

## 2017-10-07 DIAGNOSIS — I517 Cardiomegaly: Secondary | ICD-10-CM | POA: Diagnosis not present

## 2017-10-07 DIAGNOSIS — Z5112 Encounter for antineoplastic immunotherapy: Secondary | ICD-10-CM | POA: Diagnosis not present

## 2017-10-07 DIAGNOSIS — Z5111 Encounter for antineoplastic chemotherapy: Secondary | ICD-10-CM

## 2017-10-07 DIAGNOSIS — J181 Lobar pneumonia, unspecified organism: Secondary | ICD-10-CM | POA: Insufficient documentation

## 2017-10-07 LAB — CBC WITH DIFFERENTIAL/PLATELET
Basophils Absolute: 0 10*3/uL (ref 0–0.1)
Basophils Relative: 0 %
EOS ABS: 0 10*3/uL (ref 0–0.7)
Eosinophils Relative: 1 %
HCT: 25.4 % — ABNORMAL LOW (ref 40.0–52.0)
Hemoglobin: 8.1 g/dL — ABNORMAL LOW (ref 13.0–18.0)
LYMPHS ABS: 0.7 10*3/uL — AB (ref 1.0–3.6)
Lymphocytes Relative: 10 %
MCH: 23.2 pg — AB (ref 26.0–34.0)
MCHC: 32 g/dL (ref 32.0–36.0)
MCV: 72.6 fL — ABNORMAL LOW (ref 80.0–100.0)
MONOS PCT: 5 %
Monocytes Absolute: 0.3 10*3/uL (ref 0.2–1.0)
Neutro Abs: 5.3 10*3/uL (ref 1.4–6.5)
Neutrophils Relative %: 84 %
Platelets: 139 10*3/uL — ABNORMAL LOW (ref 150–440)
RBC: 3.5 MIL/uL — ABNORMAL LOW (ref 4.40–5.90)
RDW: 19.9 % — AB (ref 11.5–14.5)
WBC: 6.4 10*3/uL (ref 3.8–10.6)

## 2017-10-07 LAB — COMPREHENSIVE METABOLIC PANEL
ALK PHOS: 67 U/L (ref 38–126)
ALT: 25 U/L (ref 17–63)
ANION GAP: 7 (ref 5–15)
AST: 38 U/L (ref 15–41)
Albumin: 2.4 g/dL — ABNORMAL LOW (ref 3.5–5.0)
BUN: 36 mg/dL — ABNORMAL HIGH (ref 6–20)
CALCIUM: 7 mg/dL — AB (ref 8.9–10.3)
CO2: 23 mmol/L (ref 22–32)
Chloride: 106 mmol/L (ref 101–111)
Creatinine, Ser: 2.86 mg/dL — ABNORMAL HIGH (ref 0.61–1.24)
GFR calc non Af Amer: 20 mL/min — ABNORMAL LOW (ref >60)
GFR, EST AFRICAN AMERICAN: 24 mL/min — AB (ref >60)
Glucose, Bld: 187 mg/dL — ABNORMAL HIGH (ref 65–99)
POTASSIUM: 4.4 mmol/L (ref 3.5–5.1)
SODIUM: 136 mmol/L (ref 135–145)
Total Bilirubin: 0.9 mg/dL (ref 0.3–1.2)
Total Protein: 5.8 g/dL — ABNORMAL LOW (ref 6.5–8.1)

## 2017-10-07 MED ORDER — PROCHLORPERAZINE MALEATE 10 MG PO TABS
10.0000 mg | ORAL_TABLET | Freq: Once | ORAL | Status: AC
  Administered 2017-10-07: 10 mg via ORAL
  Filled 2017-10-07: qty 1

## 2017-10-07 MED ORDER — BORTEZOMIB CHEMO SQ INJECTION 3.5 MG (2.5MG/ML)
1.3000 mg/m2 | Freq: Once | INTRAMUSCULAR | Status: AC
  Administered 2017-10-07: 2.75 mg via SUBCUTANEOUS
  Filled 2017-10-07: qty 2.75

## 2017-10-07 MED ORDER — SODIUM CHLORIDE 0.9 % IV SOLN
Freq: Once | INTRAVENOUS | Status: AC
  Administered 2017-10-07: 11:00:00 via INTRAVENOUS
  Filled 2017-10-07: qty 1000

## 2017-10-07 MED ORDER — MORPHINE SULFATE 2 MG/ML IJ SOLN
4.0000 mg | Freq: Once | INTRAMUSCULAR | Status: AC
  Administered 2017-10-07: 4 mg via INTRAVENOUS
  Filled 2017-10-07: qty 2

## 2017-10-07 MED ORDER — DEXAMETHASONE 4 MG PO TABS
20.0000 mg | ORAL_TABLET | Freq: Once | ORAL | Status: AC
  Administered 2017-10-07: 20 mg via ORAL
  Filled 2017-10-07: qty 5

## 2017-10-07 NOTE — Progress Notes (Signed)
Patient c/o increase pain to neck and upper back. Patient states he has been nausea and vomiting x 1 -2 days. Increase pain noted to bilateral heels, per wife blister developed while hospital stay with increase pain. Wheezing and coughing

## 2017-10-07 NOTE — Progress Notes (Signed)
Per Dr. Janese Banks proceed with velcade treatment with creatinine level of 2.86.

## 2017-10-07 NOTE — Progress Notes (Signed)
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 Mediastinal adenopathy of uncertain etiology 4. Iron deficiency anemia   Chief complaint/ Reason for visit- on treatment assessment prior to cycle 2 day 8 of velcade  Heme/Onc history: patient is a 72 year old male with a past medical history significant for congestive heart failure with an ICD in place, hypertension and diabetes among other comorbidities. Recent SPEP on 09/19/2016 revealed a monoclonal M spike of 1.9 g of IgG kappa. Recent CBC showed white count of 5.5, H&H of 10.1/31.9 with an MCV of 69 of note patient has always been anemic with a hemoglobin between 9.6-10.7 over the last 2 years. Also his MCV has always been between 68-70 inthe past. Iron studies from 06/13/2016 revealed ferritin of 18 and a low TIBC of 224. 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 CMP has always revealed normal kidney function and normal calcium level.  Results of blood work from 10/04/2016 breast follows: CBC showed white count of 4.7, H&H of 10/31.4 with an MCV of 67.8 and a platelet count of 195. CMP was within normal limits with albumin of 3.3 and a calcium of 9.2. Beta 2 microglobulin was mildly elevated at 3. and LDH was mildly low at 90. Ferritin was low at 22 and iron studies showed a low serum iron of 39 and a TIBC low at 230. 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 B12 level was normal at 331.Patient's chronic anemia has been attributed to iron deficiency and thalassemia minor causing microcytosis  Quantitative immunoglobulins revealed an elevated IgG of 2875 and  IgG kappa monoclonal M spike of 2.3 g. kappa lambda free light chain ratio was abnormal at 14.45  Bone marrow biopsy showed hypercellular bone marrow with 36% atypical plasma cells. Cytogenetics and FISH studieswere normal  Skeletal survey showed no definitive evidence of lytic destruction or lesions inthe skeleton.PET scan showed no lytic lesions. Right renal mass was noted as well as FDG avid mediastinal adenopathy.24 hour urine protein showed 610 mg and M spike of 66 mg  Patient was referred to pulmonary for a EBUSfor mediastinal adenopathy. At the time of procedure the lymph nodes were noted to be small and shotty and procedure was abandoned due to risk of bleeding.  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 Biopsy was complicated by bleeding complications for which patient was admitted. He subsequently underwent laparoscopic right radical nephrectomy in September 2018. Post surgery patient had worsening renal functions. His postoperative course was complicated by bacteremia and urinary tract infection for which she was again hospitalized. Creatinine at that time was 3.5. Also his hemoglobin went down from a baseline of 10 down to 7.2. He did receive blood transfusion sometime in October 2018.   Myeloma labs fromfrom 07/30/2017 showed elevated IgG of 4053 up from 2797. M protein went up from 2.2-3.1 g. Free light chains were elevated at 1030 from 271 from 6 months prior. Kappa lambda light chain ratio elevated at 48.37 from a prior value of 18.71.  Repeat bone marrow biopsy on 08/20/17 showed 80% plasma cells in bone marrow consistent  with multiple myeloma  PET/CT scan done on 08/25/17 showed:1. Interval development of hypermetabolism in the L5 vertebral body. Axial CT imaging raises the question of fracture, possibly pathologic. 2. Activity seen previously at T8 and T10 is not evident on  today's exam. 3. Similar but less conspicuous hypermetabolic lymph nodes in the mediastinum and hilar regions. 4. Focal hypermetabolism in the soft tissues posterior to the right calcaneus with diffuse soft tissue edema seen in the lower extremities bilaterally   Patient received Cycle 1 of RVd (revlimid at 15 mg Q other day) along with velcade Day 1, 4, 8 and 11. Patient developed worsening pancytopenia with this regimen and plan was made to switch him to RVd lite Q35 days with revlimid given 2.5 mg 3 weeks on and 2 weeks off and weekly velcade at 1.3 gm/meter square 1, 8,15 and 22  Patient completed palliative RT to his spine and sacrum. revlimid is on hold. He last received Day 1 cycle 2 of velcade. Renal functions improving but patient still has significant anemia    Interval history-he was doing well up until last week when he was affected by a viral upper respiratory illness which was going on in his nursing home.  Since then he reports significant fatigue.  Also reports productive cough with greenish expectoration.  He has had 2 episodes of fever and his temperature this morning was 100.8.  His appetite has been poor and he has not been keeping up with his fluid intake as well  ECOG PS- 2-3 Pain scale- 4 Opioid associated constipation- no  Review of systems- Review of Systems  Constitutional: Positive for fever and malaise/fatigue. Negative for chills and weight loss.  HENT: Negative for congestion, ear discharge and nosebleeds.   Eyes: Negative for blurred vision.  Respiratory: Positive for cough. Negative for hemoptysis, sputum production, shortness of breath and wheezing.   Cardiovascular: Negative for chest pain, palpitations, orthopnea and claudication.  Gastrointestinal: Negative for abdominal pain, blood in stool, constipation, diarrhea, heartburn, melena, nausea and vomiting.  Genitourinary: Negative for dysuria, flank pain, frequency, hematuria and urgency.    Musculoskeletal: Positive for back pain. Negative for joint pain and myalgias.  Skin: Negative for rash.  Neurological: Negative for dizziness, tingling, focal weakness, seizures, weakness and headaches.  Endo/Heme/Allergies: Does not bruise/bleed easily.  Psychiatric/Behavioral: Negative for depression and suicidal ideas. The patient does not have insomnia.       No Known Allergies   Past Medical History:  Diagnosis Date  . AICD (automatic cardioverter/defibrillator) present   . Anemia   . Blockage of coronary artery of heart ([place])   . Cancer ([place])   . CHF (congestive heart failure) ([place])   . Chronic kidney disease 05/22/2017   nephrectomy-right  . Diabetes mellitus without complication ([place])   . Dysrhythmia   . Heart murmur   . Hypertension   . Hypochloremia   . LBBB (left bundle branch block)   . Neuropathy   . Presence of permanent cardiac pacemaker   . Right renal mass   . 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 CARDIAC SURGERY    . CORONARY ARTERY BYPASS GRAFT    . ENDOBRONCHIAL ULTRASOUND N/A 04/03/2017   Procedure: ENDOBRONCHIAL ULTRASOUND;  Surgeon: Flora Lipps, MD;  Location: [facility] ORS;  Service: Cardiopulmonary;  Laterality: N/A;  . Heart Bypass    . IR KYPHO LUMBAR INC FX REDUCE BONE BX UNI/BIL CANNULATION INC/IMAGING  09/12/2017  .  PACEMAKER PLACEMENT     ICD    Social History   Socioeconomic History  . Marital status: Married    Spouse name: Not on file  . Number of children: Not on file  . Years of education: Not on file  . Highest education level: Not on file  Social Needs  . Financial resource strain: Not on file  . Food insecurity - worry: Not on file  . Food insecurity - inability: Not on file  . Transportation needs - medical: Not on file  . Transportation needs - non-medical: Not on file  Occupational History  . Not on file  Tobacco Use  . Smoking status: Former Smoker    Years: 10.00     Types: Cigars    Last attempt to quit: 10/04/1993    Years since quitting: 24.0  . Smokeless tobacco: Never Used  . Tobacco comment: 3 CIGARS PER DAY  Substance and Sexual Activity  . Alcohol use: No  . Drug use: No  . Sexual activity: Not on file  Other Topics Concern  . Not on file  Social History Narrative  . Not on file    Family History  Adopted: Yes  Problem Relation Age of Onset  . Heart failure Paternal Grandmother      Current Outpatient Medications:  .  acetaminophen (TYLENOL) 325 MG tablet, Take 2 tablets (650 mg total) by mouth every 6 (six) hours as needed for mild pain (or Fever >/= 101)., Disp: , Rfl:  .  alendronate (FOSAMAX) 70 MG tablet, Take 1 tablet (70 mg total) by mouth once a week. Take with a full glass of water on an empty stomach., Disp: 4 tablet, Rfl: 0 .  atorvastatin (LIPITOR) 40 MG tablet, Take 40 mg by mouth at bedtime., Disp: , Rfl:  .  calcium-vitamin D (OSCAL WITH D) 500-200 MG-UNIT tablet, Take 1 tablet by mouth 3 (three) times daily with meals., Disp: 90 tablet, Rfl: 0 .  carvedilol (COREG) 25 MG tablet, Take 25 mg by mouth 2 (two) times daily with a meal., Disp: , Rfl:  .  cyclobenzaprine (FLEXERIL) 5 MG tablet, Take 1 tablet (5 mg total) by mouth 3 (three) times daily as needed for muscle spasms., Disp: 30 tablet, Rfl: 0 .  docusate sodium (COLACE) 100 MG capsule, Take 1 capsule (100 mg total) by mouth 2 (two) times daily., Disp: 60 capsule, Rfl: 0 .  fentaNYL (DURAGESIC - DOSED MCG/HR) 12 MCG/HR, Place 1 patch (12.5 mcg total) onto the skin every 3 (three) days., Disp: 10 patch, Rfl: 0 .  furosemide (LASIX) 40 MG tablet, Take 20-40 mg by mouth daily. Pt takes 1 pill one day and alternates with 0.5 pill the next day ( this is alternating dose every other day), Disp: , Rfl:  .  gabapentin (NEURONTIN) 100 MG capsule, Take 100 mg by mouth daily as needed (pain). , Disp: , Rfl:  .  insulin NPH-regular Human (NOVOLIN 70/30) (70-30) 100 UNIT/ML  injection, 8u (AM) and 8u (PM), Disp: 10 mL, Rfl: 0 .  lenalidomide (REVLIMID) 2.5 MG capsule, Take 1 capsule (2.5 mg total) by mouth daily. For 7 days and then 2 weeks off, this is dose decrease., Disp: 7 capsule, Rfl: 0 .  oxyCODONE (OXY IR/ROXICODONE) 5 MG immediate release tablet, Take 1 tablet (5 mg total) by mouth every 4 (four) hours as needed for moderate pain or severe pain., Disp: 30 tablet, Rfl: 0 .  tamsulosin (FLOMAX) 0.4 MG CAPS capsule, Take 0.4  mg by mouth daily., Disp: , Rfl:  .  vitamin B-12 (CYANOCOBALAMIN) 1000 MCG tablet, Take 1,000 mcg by mouth daily., Disp: , Rfl:  .  vitamin C (ASCORBIC ACID) 500 MG tablet, Take 500 mg by mouth daily., Disp: , Rfl:  .  acyclovir (ZOVIRAX) 400 MG tablet, Take 1 tablet (400 mg total) by mouth daily. (Patient not taking: Reported on 10/07/2017), Disp: 30 tablet, Rfl: 5 .  amoxicillin-clavulanate (AUGMENTIN) 875-125 MG tablet, Take 1 tablet by mouth every 12 (twelve) hours. (Patient not taking: Reported on 10/07/2017), Disp: 5 tablet, Rfl: 0 .  traZODone (DESYREL) 50 MG tablet, Take 0.5 tablets (25 mg total) by mouth at bedtime as needed for sleep. (Patient not taking: Reported on 10/07/2017), Disp: 30 tablet, Rfl: 0 No current facility-administered medications for this visit.   Facility-Administered Medications Ordered in Other Visits:  .  0.9 %  sodium chloride infusion, , Intravenous, Once, Randa Evens C, MD .  morphine 2 MG/ML injection 4 mg, 4 mg, Intravenous, Once, Sindy Guadeloupe, MD  Physical exam:  Vitals:   10/07/17 0941  BP: 118/65  Pulse: 75  Resp: (!) 22  Temp: (!) 100.8 F (38.2 C)  TempSrc: Tympanic  Height: 6' (1.829 m)   Physical Exam  Constitutional: He is oriented to person, place, and time.  Appears fatigued.  Sitting in a wheelchair  HENT:  Head: Normocephalic and atraumatic.  Eyes: EOM are normal. Pupils are equal, round, and reactive to light.  Neck: Normal range of motion.  Cardiovascular: Regular rhythm and  normal heart sounds.  tachycardic  Pulmonary/Chest: Effort normal and breath sounds normal.  Abdominal: Soft. Bowel sounds are normal.  Musculoskeletal: He exhibits edema.  Superficial skin ulceration noted at the back of bilateral heel secondary to pressure sores  Neurological: He is alert and oriented to person, place, and time.  Skin: Skin is warm and dry.     CMP Latest Ref Rng & Units 10/07/2017  Glucose 65 - 99 mg/dL 187(H)  BUN 6 - 20 mg/dL 36(H)  Creatinine 0.61 - 1.24 mg/dL 2.86(H)  Sodium 135 - 145 mmol/L 136  Potassium 3.5 - 5.1 mmol/L 4.4  Chloride 101 - 111 mmol/L 106  CO2 22 - 32 mmol/L 23  Calcium 8.9 - 10.3 mg/dL 7.0(L)  Total Protein 6.5 - 8.1 g/dL 5.8(L)  Total Bilirubin 0.3 - 1.2 mg/dL 0.9  Alkaline Phos 38 - 126 U/L 67  AST 15 - 41 U/L 38  ALT 17 - 63 U/L 25   CBC Latest Ref Rng & Units 10/07/2017  WBC 3.8 - 10.6 K/uL 6.4  Hemoglobin 13.0 - 18.0 g/dL 8.1(L)  Hematocrit 40.0 - 52.0 % 25.4(L)  Platelets 150 - 440 K/uL 139(L)    No images are attached to the encounter.  Dg Chest Port 1 View  Result Date: 09/10/2017 CLINICAL DATA:  Multiple myeloma, back pain for 4 days EXAM: PORTABLE CHEST 1 VIEW COMPARISON:  09/04/2017 FINDINGS: There is no focal parenchymal opacity. There is no pleural effusion or pneumothorax. There is stable cardiomegaly. There is a 3 lead cardiac pacemaker. There is evidence of prior CABG. The osseous structures are unremarkable. IMPRESSION: No active disease. Electronically Signed   By: Kathreen Devoid   On: 09/10/2017 21:43   Ir Kypho Lumbar Inc Fx Reduce Bone Bx Uni/bil Cannulation Inc/imaging  Result Date: 09/12/2017 CLINICAL DATA:  History of multiple myeloma, now with symptomatic malignant L5 compression fracture. Patient with history of AICD/pacemaker and as such, presents today for  fluoroscopic guided L5 vertebral body biopsy and cement augmentation. EXAM: FLUOROSCOPIC GUIDED KYPHOPLASTY OF THE L5 VERTEBRAL BODY COMPARISON:  Lumbar  spine CT - 08/28/2017; PET-CT - 08/25/2017 MEDICATIONS: As antibiotic prophylaxis, Ancef 2 gm IV was ordered pre-procedure and administered intravenously within 1 hour of incision. ANESTHESIA/SEDATION: Moderate (conscious) sedation was employed during this procedure. A total of Versed 4 mg and Fentanyl 200 mcg was administered intravenously. Moderate Sedation Time: 50 minutes. The patient's level of consciousness and vital signs were monitored continuously by radiology nursing throughout the procedure under my direct supervision. FLUOROSCOPY TIME:  11 Min, 54 seconds (6712 mGy) COMPLICATIONS: None immediate. TECHNIQUE: The procedure, risks (including but not limited to bleeding, infection, organ damage), benefits, and alternatives were explained to the patient. Questions regarding the procedure were encouraged and answered. The patient understands and consents to the procedure. The patient was placed prone on the fluoroscopic table. The skin overlying the lower lumbar region was then prepped and draped in the usual sterile fashion. Maximal barrier sterile technique was utilized including caps, mask, sterile gowns, sterile gloves, sterile drape, hand hygiene and skin antiseptic. Intravenous Fentanyl and Versed were administered as conscious sedation during continuous cardiorespiratory monitoring by the radiology RN. The left pedicle at L5 was then infiltrated with 1% lidocaine followed by the advancement of a Kyphon trocar needle through the left pedicle into the posterior one-third of the vertebral body. The trocar was removed and a bone biopsy was obtained at this location. Subsequently, the osteo drill was advanced to the anterior third of the vertebral body. The osteo drill was retracted. Through the working cannula, a Kyphon inflatable bone tamp 15 x 3 was advanced and positioned with the distal marker approximately 5 mm from the anterior aspect of the cortex. Appropriate positioning was confirmed on the AP  projection. At this time, the balloon was expanded using contrast via a Kyphon inflation syringe device via micro tubing. In similar fashion, the right L5 pedicle was infiltrated with 1% lidocaine followed by the advancement of a second Kyphon trocar needle through the right pedicle into the posterior third of the vertebral body. Again, a bone biopsy was obtained at this location. Subsequently, the osteo drill was coaxially advanced to the anterior right third. The osteo drill was exchanged for a Kyphon inflatable bone tamp 15 x 3, advanced to the 5 mm of the anterior aspect of the cortex. The balloon was then expanded using contrast as above. Inflations were continued until there was near apposition with the superior end plate. At this time, methylmethacrylate mixture was reconstituted in the Kyphon bone mixing device system. This was then loaded into the delivery mechanism, attached to Kyphon bone fillers. The balloons were deflated and removed followed by the instillation of methylmethacrylate mixture with excellent filling in the AP and lateral projections. No extravasation was noted in the disk spaces or posteriorly into the spinal canal. No epidural venous contamination was seen. The working cannulae and the bone filler were then retrieved and removed. Hemostasis was achieved with manual compression. The patient tolerated the procedure well without immediate postprocedural complication. IMPRESSION: 1. Technically successful L5 vertebral body augmentation using balloon kyphoplasty. 2. Technically successful L5 vertebral body biopsy. Electronically Signed   By: Sandi Mariscal M.D.   On: 09/12/2017 14:27     Assessment and plan- Patient is a 72 y.o. male with standard risk lambda IgG multiple myeloma RISS stage II   Sepsis:  I will obtain chest x-ray and urinalysis today.  Given his symptoms  of low-grade fever and productive cough with sputum I will prescribe Levaquin 250 mg p.o. daily for 7 days for  possible pneumonia.  I will see him back in a week's time with a BMP  Multiple myeloma: He will proceed with cycle number 1 day 22 of Velcade today.  He did complete 1 week of Revlimid until yesterday.  He will be getting 2 weeks of treatment starting today.  I will see him back in 2 weeks time with CBC CMP myeloma panel serum free light chains and 24-hour urine protein electrophoresis.  Patient is currently on an RVD lite regimen where he gets Velcade weekly day 1, 8, 15, 22 and Revlimid 2.5 mg daily 3 weeks on and 2 weeks off of a 35-day cycle  Dehydration and poor oral intake: We will give him 1 L of IV fluids today and reassess next week   his calcium level is low and I will reassess if he can get Xgeva next week he will continue oral calcium at this time   Visit Diagnosis 1. Fever, unspecified   2. Multiple myeloma not having achieved remission ([place])   3. Encounter for antineoplastic chemotherapy      Dr. Randa Evens, MD, MPH [facility] Pager- [phone] 10/07/2017 1:37 PM

## 2017-10-07 NOTE — Progress Notes (Signed)
Pt d/c IV 12:45 from left arm. Pt was given morphine 4 mg IV push over 6 minutes. Pt rated pain in lower back, feet. 8 1/2 after 10 min of getting injectio patient rated pain 7. Pt taken to radiology fo rcxr. At the desk it states he feels even better but did not give a number at that time. He wa able to get up with min asst after getting fluid and pain med to get in wheelchair

## 2017-10-09 LAB — CHROMOSOME ANALYSIS, BONE MARROW

## 2017-10-09 LAB — TISSUE HYBRIDIZATION (BONE MARROW)-NCBH

## 2017-10-10 ENCOUNTER — Inpatient Hospital Stay: Payer: Medicare Other

## 2017-10-10 ENCOUNTER — Telehealth: Payer: Self-pay | Admitting: *Deleted

## 2017-10-10 DIAGNOSIS — C9000 Multiple myeloma not having achieved remission: Secondary | ICD-10-CM

## 2017-10-10 NOTE — Telephone Encounter (Signed)
Called wife per Dr. Elroy Channel request since patient was feeling bad earlier in the week.  Spoke to wife about seeing him next week just to get some lab work and see if he needs any fluids.  Wife states that he still running the fever but the doctor from the nursing home came and saw him today and started him on IV fluids and IV antibiotics.  She is more than glad to have him come over here on next Tuesday and see [place] to make sure everything is going okay.  I told her that the Lucianne Lei will come over at the [facility] at 945 to pick patient up. Then he will come will be here and get lab work see the doctor and fluids if needed.  wife is agreeable to the plan.

## 2017-10-12 ENCOUNTER — Inpatient Hospital Stay
Admission: EM | Admit: 2017-10-12 | Discharge: 2017-10-18 | DRG: 314 | Disposition: A | Discharge status: Hospice | Payer: Medicare Other | Attending: Internal Medicine | Admitting: Internal Medicine

## 2017-10-12 ENCOUNTER — Other Ambulatory Visit: Payer: Self-pay

## 2017-10-12 ENCOUNTER — Encounter: Payer: Self-pay | Admitting: Emergency Medicine

## 2017-10-12 ENCOUNTER — Emergency Department: Payer: Medicare Other

## 2017-10-12 DIAGNOSIS — I420 Dilated cardiomyopathy: Secondary | ICD-10-CM | POA: Diagnosis present

## 2017-10-12 DIAGNOSIS — E1122 Type 2 diabetes mellitus with diabetic chronic kidney disease: Secondary | ICD-10-CM | POA: Diagnosis present

## 2017-10-12 DIAGNOSIS — R338 Other retention of urine: Secondary | ICD-10-CM | POA: Diagnosis not present

## 2017-10-12 DIAGNOSIS — A4102 Sepsis due to Methicillin resistant Staphylococcus aureus: Secondary | ICD-10-CM | POA: Diagnosis present

## 2017-10-12 DIAGNOSIS — D631 Anemia in chronic kidney disease: Secondary | ICD-10-CM | POA: Diagnosis present

## 2017-10-12 DIAGNOSIS — Z86010 Personal history of colonic polyps: Secondary | ICD-10-CM | POA: Diagnosis not present

## 2017-10-12 DIAGNOSIS — N179 Acute kidney failure, unspecified: Secondary | ICD-10-CM | POA: Diagnosis not present

## 2017-10-12 DIAGNOSIS — I130 Hypertensive heart and chronic kidney disease with heart failure and stage 1 through stage 4 chronic kidney disease, or unspecified chronic kidney disease: Secondary | ICD-10-CM | POA: Diagnosis present

## 2017-10-12 DIAGNOSIS — E11621 Type 2 diabetes mellitus with foot ulcer: Secondary | ICD-10-CM | POA: Diagnosis present

## 2017-10-12 DIAGNOSIS — E11319 Type 2 diabetes mellitus with unspecified diabetic retinopathy without macular edema: Secondary | ICD-10-CM | POA: Diagnosis present

## 2017-10-12 DIAGNOSIS — I350 Nonrheumatic aortic (valve) stenosis: Secondary | ICD-10-CM | POA: Diagnosis present

## 2017-10-12 DIAGNOSIS — I255 Ischemic cardiomyopathy: Secondary | ICD-10-CM | POA: Diagnosis present

## 2017-10-12 DIAGNOSIS — J15212 Pneumonia due to Methicillin resistant Staphylococcus aureus: Secondary | ICD-10-CM | POA: Diagnosis present

## 2017-10-12 DIAGNOSIS — I2510 Atherosclerotic heart disease of native coronary artery without angina pectoris: Secondary | ICD-10-CM | POA: Diagnosis present

## 2017-10-12 DIAGNOSIS — Z7951 Long term (current) use of inhaled steroids: Secondary | ICD-10-CM

## 2017-10-12 DIAGNOSIS — N183 Chronic kidney disease, stage 3 (moderate): Secondary | ICD-10-CM | POA: Diagnosis present

## 2017-10-12 DIAGNOSIS — Y95 Nosocomial condition: Secondary | ICD-10-CM | POA: Diagnosis present

## 2017-10-12 DIAGNOSIS — Y831 Surgical operation with implant of artificial internal device as the cause of abnormal reaction of the patient, or of later complication, without mention of misadventure at the time of the procedure: Secondary | ICD-10-CM | POA: Diagnosis present

## 2017-10-12 DIAGNOSIS — D899 Disorder involving the immune mechanism, unspecified: Secondary | ICD-10-CM | POA: Diagnosis present

## 2017-10-12 DIAGNOSIS — Z951 Presence of aortocoronary bypass graft: Secondary | ICD-10-CM

## 2017-10-12 DIAGNOSIS — Z515 Encounter for palliative care: Secondary | ICD-10-CM | POA: Diagnosis not present

## 2017-10-12 DIAGNOSIS — R7881 Bacteremia: Secondary | ICD-10-CM | POA: Diagnosis not present

## 2017-10-12 DIAGNOSIS — J9690 Respiratory failure, unspecified, unspecified whether with hypoxia or hypercapnia: Secondary | ICD-10-CM | POA: Diagnosis not present

## 2017-10-12 DIAGNOSIS — Z794 Long term (current) use of insulin: Secondary | ICD-10-CM

## 2017-10-12 DIAGNOSIS — Z7189 Other specified counseling: Secondary | ICD-10-CM | POA: Diagnosis not present

## 2017-10-12 DIAGNOSIS — I447 Left bundle-branch block, unspecified: Secondary | ICD-10-CM | POA: Diagnosis present

## 2017-10-12 DIAGNOSIS — Z905 Acquired absence of kidney: Secondary | ICD-10-CM

## 2017-10-12 DIAGNOSIS — J1008 Influenza due to other identified influenza virus with other specified pneumonia: Secondary | ICD-10-CM | POA: Diagnosis not present

## 2017-10-12 DIAGNOSIS — I340 Nonrheumatic mitral (valve) insufficiency: Secondary | ICD-10-CM | POA: Diagnosis present

## 2017-10-12 DIAGNOSIS — Z85528 Personal history of other malignant neoplasm of kidney: Secondary | ICD-10-CM

## 2017-10-12 DIAGNOSIS — E1140 Type 2 diabetes mellitus with diabetic neuropathy, unspecified: Secondary | ICD-10-CM | POA: Diagnosis present

## 2017-10-12 DIAGNOSIS — I509 Heart failure, unspecified: Secondary | ICD-10-CM

## 2017-10-12 DIAGNOSIS — Z8249 Family history of ischemic heart disease and other diseases of the circulatory system: Secondary | ICD-10-CM

## 2017-10-12 DIAGNOSIS — Z7983 Long term (current) use of bisphosphonates: Secondary | ICD-10-CM

## 2017-10-12 DIAGNOSIS — L97409 Non-pressure chronic ulcer of unspecified heel and midfoot with unspecified severity: Secondary | ICD-10-CM | POA: Diagnosis present

## 2017-10-12 DIAGNOSIS — D61818 Other pancytopenia: Secondary | ICD-10-CM | POA: Diagnosis present

## 2017-10-12 DIAGNOSIS — J1100 Influenza due to unidentified influenza virus with unspecified type of pneumonia: Secondary | ICD-10-CM | POA: Diagnosis not present

## 2017-10-12 DIAGNOSIS — J9601 Acute respiratory failure with hypoxia: Secondary | ICD-10-CM | POA: Diagnosis present

## 2017-10-12 DIAGNOSIS — T827XXA Infection and inflammatory reaction due to other cardiac and vascular devices, implants and grafts, initial encounter: Secondary | ICD-10-CM | POA: Diagnosis present

## 2017-10-12 DIAGNOSIS — Z87891 Personal history of nicotine dependence: Secondary | ICD-10-CM

## 2017-10-12 DIAGNOSIS — N401 Enlarged prostate with lower urinary tract symptoms: Secondary | ICD-10-CM | POA: Diagnosis not present

## 2017-10-12 DIAGNOSIS — C9000 Multiple myeloma not having achieved remission: Secondary | ICD-10-CM | POA: Diagnosis present

## 2017-10-12 DIAGNOSIS — Z8614 Personal history of Methicillin resistant Staphylococcus aureus infection: Secondary | ICD-10-CM

## 2017-10-12 DIAGNOSIS — Z950 Presence of cardiac pacemaker: Secondary | ICD-10-CM

## 2017-10-12 DIAGNOSIS — Z95810 Presence of automatic (implantable) cardiac defibrillator: Secondary | ICD-10-CM | POA: Diagnosis not present

## 2017-10-12 DIAGNOSIS — Z9221 Personal history of antineoplastic chemotherapy: Secondary | ICD-10-CM

## 2017-10-12 DIAGNOSIS — J189 Pneumonia, unspecified organism: Secondary | ICD-10-CM

## 2017-10-12 DIAGNOSIS — L89153 Pressure ulcer of sacral region, stage 3: Secondary | ICD-10-CM | POA: Diagnosis present

## 2017-10-12 DIAGNOSIS — I5043 Acute on chronic combined systolic (congestive) and diastolic (congestive) heart failure: Secondary | ICD-10-CM | POA: Diagnosis not present

## 2017-10-12 DIAGNOSIS — Z66 Do not resuscitate: Secondary | ICD-10-CM | POA: Diagnosis not present

## 2017-10-12 DIAGNOSIS — R0603 Acute respiratory distress: Secondary | ICD-10-CM

## 2017-10-12 DIAGNOSIS — L89302 Pressure ulcer of unspecified buttock, stage 2: Secondary | ICD-10-CM | POA: Diagnosis present

## 2017-10-12 DIAGNOSIS — I4891 Unspecified atrial fibrillation: Secondary | ICD-10-CM | POA: Diagnosis not present

## 2017-10-12 LAB — CBC WITH DIFFERENTIAL/PLATELET
BASOS PCT: 1 %
Basophils Absolute: 0 10*3/uL (ref 0–0.1)
EOS PCT: 0 %
Eosinophils Absolute: 0 10*3/uL (ref 0–0.7)
HEMATOCRIT: 24.9 % — AB (ref 40.0–52.0)
Hemoglobin: 8 g/dL — ABNORMAL LOW (ref 13.0–18.0)
LYMPHS ABS: 0.2 10*3/uL — AB (ref 1.0–3.6)
Lymphocytes Relative: 12 %
MCH: 22.7 pg — AB (ref 26.0–34.0)
MCHC: 32.2 g/dL (ref 32.0–36.0)
MCV: 70.4 fL — AB (ref 80.0–100.0)
MONO ABS: 0.2 10*3/uL (ref 0.2–1.0)
MONOS PCT: 12 %
NEUTROS ABS: 1.3 10*3/uL — AB (ref 1.4–6.5)
Neutrophils Relative %: 75 %
Platelets: 71 10*3/uL — ABNORMAL LOW (ref 150–440)
RBC: 3.54 MIL/uL — AB (ref 4.40–5.90)
RDW: 19.9 % — AB (ref 11.5–14.5)
WBC: 1.7 10*3/uL — AB (ref 3.8–10.6)

## 2017-10-12 LAB — COMPREHENSIVE METABOLIC PANEL
ALK PHOS: 61 U/L (ref 38–126)
ALT: 41 U/L (ref 17–63)
AST: 65 U/L — ABNORMAL HIGH (ref 15–41)
Albumin: 1.9 g/dL — ABNORMAL LOW (ref 3.5–5.0)
Anion gap: 8 (ref 5–15)
BILIRUBIN TOTAL: 0.8 mg/dL (ref 0.3–1.2)
BUN: 53 mg/dL — ABNORMAL HIGH (ref 6–20)
CALCIUM: 7 mg/dL — AB (ref 8.9–10.3)
CO2: 20 mmol/L — AB (ref 22–32)
CREATININE: 2.54 mg/dL — AB (ref 0.61–1.24)
Chloride: 113 mmol/L — ABNORMAL HIGH (ref 101–111)
GFR calc non Af Amer: 24 mL/min — ABNORMAL LOW (ref >60)
GFR, EST AFRICAN AMERICAN: 27 mL/min — AB (ref >60)
GLUCOSE: 196 mg/dL — AB (ref 65–99)
Potassium: 4.1 mmol/L (ref 3.5–5.1)
SODIUM: 141 mmol/L (ref 135–145)
TOTAL PROTEIN: 5.6 g/dL — AB (ref 6.5–8.1)

## 2017-10-12 LAB — TROPONIN I: Troponin I: 1.22 ng/mL (ref <0.03)

## 2017-10-12 LAB — BRAIN NATRIURETIC PEPTIDE: B Natriuretic Peptide: 534 pg/mL — ABNORMAL HIGH (ref 0.0–100.0)

## 2017-10-12 LAB — GLUCOSE, CAPILLARY: Glucose-Capillary: 213 mg/dL — ABNORMAL HIGH (ref 65–99)

## 2017-10-12 MED ORDER — ONDANSETRON HCL 4 MG/2ML IJ SOLN: 4.0000 mg | Freq: Four times a day (QID) | INTRAMUSCULAR | Status: DC | PRN

## 2017-10-12 MED ORDER — SODIUM CHLORIDE 0.9 % IV SOLN
2.0000 g | Freq: Once | INTRAVENOUS | Status: AC
  Administered 2017-10-12: 2 g via INTRAVENOUS
  Filled 2017-10-12: qty 2

## 2017-10-12 MED ORDER — FENTANYL 12 MCG/HR TD PT72
12.5000 ug | MEDICATED_PATCH | TRANSDERMAL | Status: DC
  Administered 2017-10-13: 12.5 ug via TRANSDERMAL
  Filled 2017-10-12: qty 1

## 2017-10-12 MED ORDER — CALCIUM CARBONATE-VITAMIN D 500-200 MG-UNIT PO TABS
1.0000 | ORAL_TABLET | Freq: Two times a day (BID) | ORAL | Status: DC
  Administered 2017-10-13 – 2017-10-14 (×4): 1 via ORAL
  Filled 2017-10-12 (×7): qty 1

## 2017-10-12 MED ORDER — TAMSULOSIN HCL 0.4 MG PO CAPS
0.4000 mg | ORAL_CAPSULE | Freq: Every day | ORAL | Status: DC
  Administered 2017-10-13 – 2017-10-15 (×3): 0.4 mg via ORAL
  Filled 2017-10-12 (×5): qty 1

## 2017-10-12 MED ORDER — CARVEDILOL 25 MG PO TABS
25.0000 mg | ORAL_TABLET | Freq: Two times a day (BID) | ORAL | Status: DC
  Administered 2017-10-13 – 2017-10-14 (×4): 25 mg via ORAL
  Filled 2017-10-12: qty 2
  Filled 2017-10-12: qty 1
  Filled 2017-10-12 (×3): qty 2
  Filled 2017-10-12: qty 1

## 2017-10-12 MED ORDER — ACYCLOVIR 200 MG PO CAPS
400.0000 mg | ORAL_CAPSULE | Freq: Every day | ORAL | Status: DC
  Administered 2017-10-13 – 2017-10-14 (×2): 400 mg via ORAL
  Filled 2017-10-12 (×6): qty 2

## 2017-10-12 MED ORDER — ACETAMINOPHEN 325 MG PO TABS: 650.0000 mg | ORAL_TABLET | ORAL | Status: DC | PRN

## 2017-10-12 MED ORDER — VANCOMYCIN HCL IN DEXTROSE 1-5 GM/200ML-% IV SOLN
1000.0000 mg | INTRAVENOUS | Status: DC
  Administered 2017-10-13: 1000 mg via INTRAVENOUS
  Filled 2017-10-12 (×2): qty 200

## 2017-10-12 MED ORDER — OXYCODONE HCL 5 MG PO TABS
5.0000 mg | ORAL_TABLET | ORAL | Status: DC | PRN
  Administered 2017-10-13 – 2017-10-18 (×9): 5 mg via ORAL
  Filled 2017-10-12 (×9): qty 1

## 2017-10-12 MED ORDER — FUROSEMIDE 10 MG/ML IJ SOLN
40.0000 mg | Freq: Two times a day (BID) | INTRAMUSCULAR | Status: DC
  Administered 2017-10-13: 40 mg via INTRAVENOUS
  Filled 2017-10-12: qty 4

## 2017-10-12 MED ORDER — OSELTAMIVIR PHOSPHATE 30 MG PO CAPS
30.0000 mg | ORAL_CAPSULE | Freq: Every day | ORAL | Status: AC
  Administered 2017-10-13 – 2017-10-15 (×3): 30 mg via ORAL
  Filled 2017-10-12 (×5): qty 1

## 2017-10-12 MED ORDER — ALENDRONATE SODIUM 70 MG PO TABS: 70.0000 mg | ORAL_TABLET | ORAL | Status: DC

## 2017-10-12 MED ORDER — ALBUTEROL SULFATE (2.5 MG/3ML) 0.083% IN NEBU: 2.5000 mg | INHALATION_SOLUTION | RESPIRATORY_TRACT | Status: DC | PRN

## 2017-10-12 MED ORDER — ATORVASTATIN CALCIUM 20 MG PO TABS
40.0000 mg | ORAL_TABLET | Freq: Every day | ORAL | Status: DC
  Administered 2017-10-13 – 2017-10-14 (×2): 40 mg via ORAL
  Filled 2017-10-12 (×2): qty 2

## 2017-10-12 MED ORDER — HEPARIN SODIUM (PORCINE) 5000 UNIT/ML IJ SOLN
5000.0000 [IU] | Freq: Three times a day (TID) | INTRAMUSCULAR | Status: DC
  Administered 2017-10-12 – 2017-10-16 (×13): 5000 [IU] via SUBCUTANEOUS
  Filled 2017-10-12 (×13): qty 1

## 2017-10-12 MED ORDER — OSELTAMIVIR PHOSPHATE 75 MG PO CAPS: 75.0000 mg | ORAL_CAPSULE | Freq: Two times a day (BID) | ORAL | Status: DC

## 2017-10-12 MED ORDER — TRAZODONE HCL 50 MG PO TABS
50.0000 mg | ORAL_TABLET | Freq: Every day | ORAL | Status: DC
  Administered 2017-10-13 – 2017-10-17 (×3): 50 mg via ORAL
  Filled 2017-10-12 (×3): qty 1

## 2017-10-12 MED ORDER — PANTOPRAZOLE SODIUM 40 MG IV SOLR
40.0000 mg | Freq: Every day | INTRAVENOUS | Status: DC
  Administered 2017-10-12: 40 mg via INTRAVENOUS
  Filled 2017-10-12: qty 40

## 2017-10-12 MED ORDER — ONDANSETRON HCL 4 MG PO TABS
4.0000 mg | ORAL_TABLET | Freq: Four times a day (QID) | ORAL | Status: DC | PRN
Start: 2017-10-12 — End: 2017-10-13

## 2017-10-12 MED ORDER — FUROSEMIDE 10 MG/ML IJ SOLN
80.0000 mg | Freq: Once | INTRAMUSCULAR | Status: AC
  Administered 2017-10-12: 80 mg via INTRAVENOUS
  Filled 2017-10-12: qty 8

## 2017-10-12 MED ORDER — SODIUM CHLORIDE 0.9 % IV SOLN: 250.0000 mL | INTRAVENOUS | Status: DC | PRN

## 2017-10-12 MED ORDER — INSULIN ASPART 100 UNIT/ML ~~LOC~~ SOLN
0.0000 [IU] | Freq: Three times a day (TID) | SUBCUTANEOUS | Status: DC
  Administered 2017-10-13 (×2): 7 [IU] via SUBCUTANEOUS
  Administered 2017-10-13: 4 [IU] via SUBCUTANEOUS
  Administered 2017-10-14 (×3): 11 [IU] via SUBCUTANEOUS
  Administered 2017-10-15: 4 [IU] via SUBCUTANEOUS
  Administered 2017-10-15: 7 [IU] via SUBCUTANEOUS
  Administered 2017-10-16: 3 [IU] via SUBCUTANEOUS
  Filled 2017-10-12 (×9): qty 1

## 2017-10-12 MED ORDER — VITAMIN C 500 MG PO TABS
500.0000 mg | ORAL_TABLET | Freq: Every day | ORAL | Status: DC
  Administered 2017-10-13 – 2017-10-14 (×2): 500 mg via ORAL
  Filled 2017-10-12 (×5): qty 1

## 2017-10-12 MED ORDER — ASPIRIN 300 MG RE SUPP: 300.0000 mg | RECTAL | Status: DC

## 2017-10-12 MED ORDER — LOPERAMIDE HCL 2 MG PO CAPS: 2.0000 mg | ORAL_CAPSULE | Freq: Four times a day (QID) | ORAL | Status: DC | PRN

## 2017-10-12 MED ORDER — DOCUSATE SODIUM 100 MG PO CAPS
100.0000 mg | ORAL_CAPSULE | Freq: Two times a day (BID) | ORAL | Status: DC
  Administered 2017-10-13 – 2017-10-14 (×4): 100 mg via ORAL
  Filled 2017-10-12 (×6): qty 1

## 2017-10-12 MED ORDER — POTASSIUM CHLORIDE CRYS ER 20 MEQ PO TBCR
20.0000 meq | EXTENDED_RELEASE_TABLET | Freq: Two times a day (BID) | ORAL | Status: DC
  Administered 2017-10-13 – 2017-10-14 (×4): 20 meq via ORAL
  Filled 2017-10-12 (×5): qty 1

## 2017-10-12 MED ORDER — SODIUM CHLORIDE 0.9 % IV SOLN
1.0000 g | Freq: Two times a day (BID) | INTRAVENOUS | Status: DC
  Administered 2017-10-13: 1 g via INTRAVENOUS
  Filled 2017-10-12: qty 1

## 2017-10-12 MED ORDER — VITAMIN B-12 1000 MCG PO TABS
1000.0000 ug | ORAL_TABLET | Freq: Every day | ORAL | Status: DC
  Administered 2017-10-13 – 2017-10-14 (×2): 1000 ug via ORAL
  Filled 2017-10-12 (×5): qty 1

## 2017-10-12 MED ORDER — INSULIN ASPART 100 UNIT/ML ~~LOC~~ SOLN
0.0000 [IU] | Freq: Every day | SUBCUTANEOUS | Status: DC
  Administered 2017-10-12 – 2017-10-13 (×2): 2 [IU] via SUBCUTANEOUS
  Filled 2017-10-12 (×2): qty 1

## 2017-10-12 MED ORDER — SODIUM CHLORIDE 0.9 % IV SOLN
2.0000 g | INTRAVENOUS | Status: DC
  Filled 2017-10-12: qty 2

## 2017-10-12 MED ORDER — ASPIRIN 81 MG PO CHEW: 324.0000 mg | CHEWABLE_TABLET | ORAL | Status: DC

## 2017-10-12 MED ORDER — VANCOMYCIN HCL IN DEXTROSE 1-5 GM/200ML-% IV SOLN
1000.0000 mg | Freq: Once | INTRAVENOUS | Status: AC
  Administered 2017-10-12: 1000 mg via INTRAVENOUS
  Filled 2017-10-12: qty 200

## 2017-10-12 NOTE — H&P (Signed)
[facility] NAME: Adam Wall    MR#:  [id number]  DATE OF BIRTH:  Nov 09, 1945  DATE OF ADMISSION:  10/12/2017  PRIMARY CARE PHYSICIAN: [name], MD   REQUESTING/REFERRING PHYSICIAN:   CHIEF COMPLAINT:   Chief Complaint  Patient presents with  . Respiratory Distress    HISTORY OF PRESENT ILLNESS: Adam Wall  is a 72 y.o. male with a known history per below, recent hospital discharge, in skilled nursing facility-currently being treated for pneumonia/normal virus/influenza, presents to the emergency room with worsening dyspnea/shortness of breath, satting 88% on nonrebreather, placed on subsequent BiPAP, found to have acute left lower lobe pneumonia, congestive heart failure chest x-ray, noted pancytopenia-currently undergoing chemotherapy for multiple myeloma-last treatment on September 29, 2017, patient evaluated at the bedside with wife present, patient still with increased work of breathing on BiPAP, patient is now been admitted for acute hypoxic respiratory failure secondary to healthcare associated pneumonia, acute on chronic systolic congestive heart failure exacerbation in patient with immunocompromise state from cancer.  PAST MEDICAL HISTORY:   Past Medical History:  Diagnosis Date  . AICD (automatic cardioverter/defibrillator) present   . Anemia   . Blockage of coronary artery of heart ([place])   . Cancer ([place])   . CHF (congestive heart failure) ([place])   . Chronic kidney disease 05/22/2017   nephrectomy-right  . Diabetes mellitus without complication ([place])   . Dysrhythmia   . Heart murmur   . Hypertension   . Hypochloremia   . LBBB (left bundle branch block)   . Neuropathy   . Presence of permanent cardiac pacemaker   . Right renal mass   . 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 CARDIAC SURGERY    . CORONARY ARTERY BYPASS GRAFT    . ENDOBRONCHIAL  ULTRASOUND N/A 04/03/2017   Procedure: ENDOBRONCHIAL ULTRASOUND;  Surgeon: Flora Lipps, MD;  Location: [facility] ORS;  Service: Cardiopulmonary;  Laterality: N/A;  . Heart Bypass    . IR KYPHO LUMBAR INC FX REDUCE BONE BX UNI/BIL CANNULATION INC/IMAGING  09/12/2017  . PACEMAKER PLACEMENT     ICD    SOCIAL HISTORY:  Social History   Tobacco Use  . Smoking status: Former Smoker    Years: 10.00    Types: Cigars    Last attempt to quit: 10/04/1993    Years since quitting: 24.0  . Smokeless tobacco: Never Used  . Tobacco comment: 3 CIGARS PER DAY  Substance Use Topics  . Alcohol use: No    FAMILY HISTORY:  Family History  Adopted: Yes  Problem Relation Age of Onset  . Heart failure Paternal Grandmother     DRUG ALLERGIES: No Known Allergies  REVIEW OF SYSTEMS: Limited from patient given acute respiratory failure-only able to talk in a few words at a time  CONSTITUTIONAL: No fever,+ fatigue /weakness.  EYES: No blurred or double vision.  EARS, NOSE, AND THROAT: No tinnitus or ear pain.  RESPIRATORY: +cough, shortness of breath, wheezing, no hemoptysis.  CARDIOVASCULAR: No chest pain, orthopnea, edema.  GASTROINTESTINAL: + nausea, vomiting, diarrhea, no or abdominal pain.  GENITOURINARY: No dysuria, hematuria.  ENDOCRINE: No polyuria, nocturia,  HEMATOLOGY: No anemia, easy bruising or bleeding SKIN: No rash or lesion. MUSCULOSKELETAL: No joint pain or arthritis.   NEUROLOGIC: No tingling, numbness, weakness.  PSYCHIATRY: No anxiety or depression.   MEDICATIONS AT HOME:  Prior to Admission medications  Medication Sig Start Date End Date Taking? Authorizing Provider  acetaminophen (TYLENOL) 325 MG tablet Take 2 tablets (650 mg total) by mouth every 6 (six) hours as needed for mild pain (or Fever >/= 101). 09/16/17  Yes Wieting, Richard, MD  acyclovir (ZOVIRAX) 400 MG tablet Take 1 tablet (400 mg total) by mouth daily. 08/18/17  Yes Sindy Guadeloupe, MD  albuterol (PROVENTIL) (2.5  MG/3ML) 0.083% nebulizer solution Take 2.5 mg by nebulization every 4 (four) hours as needed for wheezing or shortness of breath.   Yes [provider]  alendronate (FOSAMAX) 70 MG tablet Take 1 tablet (70 mg total) by mouth once a week. Take with a full glass of water on an empty stomach. 09/16/17  Yes Wieting, Richard, MD  atorvastatin (LIPITOR) 40 MG tablet Take 40 mg by mouth at bedtime. 02/27/17  Yes [provider]  calcium-vitamin D (OSCAL WITH D) 500-200 MG-UNIT tablet Take 1 tablet by mouth 3 (three) times daily with meals. Patient taking differently: Take 1 tablet by mouth 2 (two) times daily.  09/16/17  Yes Wieting, Richard, MD  carvedilol (COREG) 25 MG tablet Take 25 mg by mouth 2 (two) times daily with a meal.   Yes [provider]  diclofenac sodium (VOLTAREN) 1 % GEL Apply 4 g topically 4 (four) times daily.   Yes [provider]  docusate sodium (COLACE) 100 MG capsule Take 1 capsule (100 mg total) by mouth 2 (two) times daily. 09/16/17  Yes Wieting, Richard, MD  fentaNYL (DURAGESIC - DOSED MCG/HR) 12 MCG/HR Place 1 patch (12.5 mcg total) onto the skin every 3 (three) days. 09/16/17  Yes Wieting, Richard, MD  furosemide (LASIX) 40 MG tablet Take 20-40 mg by mouth daily. Pt takes 1 pill one day and alternates with 0.5 pill the next day ( this is alternating dose every other day)   Yes [provider]  gabapentin (NEURONTIN) 100 MG capsule Take 100 mg by mouth daily as needed (pain).    Yes [provider]  insulin NPH-regular Human (NOVOLIN 70/30) (70-30) 100 UNIT/ML injection 8u (AM) and 8u (PM) Patient taking differently: Inject 8 Units into the skin 2 (two) times daily with a meal.  09/16/17  Yes Wieting, Richard, MD  loperamide (IMODIUM A-D) 2 MG tablet Take 2 mg by mouth every 6 (six) hours as needed for diarrhea or loose stools.   Yes [provider]  menthol-cetylpyridinium (CEPACOL) 3 MG lozenge Take 1 lozenge by mouth 4  (four) times daily -  before meals and at bedtime. 10/11/17 10/15/17 Yes [provider]  ondansetron (ZOFRAN) 4 MG tablet Take 4 mg by mouth every 6 (six) hours as needed for nausea or vomiting.   Yes [provider]  oxyCODONE (OXY IR/ROXICODONE) 5 MG immediate release tablet Take 1 tablet (5 mg total) by mouth every 4 (four) hours as needed for moderate pain or severe pain. 09/16/17  Yes Wieting, Richard, MD  potassium chloride SA (K-DUR,KLOR-CON) 20 MEQ tablet Take 20 mEq by mouth 2 (two) times daily.   Yes [provider]  tamsulosin (FLOMAX) 0.4 MG CAPS capsule Take 0.4 mg by mouth daily.   Yes [provider]  traZODone (DESYREL) 50 MG tablet Take 0.5 tablets (25 mg total) by mouth at bedtime as needed for sleep. Patient taking differently: Take 50 mg by mouth at bedtime.  09/16/17  Yes Wieting, Richard, MD  vitamin B-12 (CYANOCOBALAMIN) 1000 MCG tablet Take 1,000 mcg by mouth daily.   Yes  [provider]  vitamin C (ASCORBIC ACID) 500 MG tablet Take 500 mg by mouth daily.   Yes [provider]  prochlorperazine (COMPAZINE) 10 MG tablet Take 1 tablet (10 mg total) by mouth every 6 (six) hours as needed (Nausea or vomiting). 08/18/17 09/04/17  Sindy Guadeloupe, MD      PHYSICAL EXAMINATION:   VITAL SIGNS: Blood pressure 139/61, pulse 73, resp. rate (!) 24, weight 90.7 kg (200 lb), SpO2 100 %.  GENERAL:  72 y.o.-year-old patient lying in the bed with acute distress.  Frail-appearing EYES: Pupils equal, round, reactive to light and accommodation. No scleral icterus. Extraocular muscles intact.  HEENT: Head atraumatic, normocephalic. Oropharynx and nasopharynx clear.  NECK:  Supple, no jugular venous distention. No thyroid enlargement, no tenderness.  LUNGS: Diminished breath sounds with rhonchi/rales bilaterally. No use of accessory muscles of respiration.  CARDIOVASCULAR: S1, S2 normal. No murmurs, rubs, or gallops.  ABDOMEN: Soft, nontender,  nondistended. Bowel sounds present. No organomegaly or mass.  EXTREMITIES: Bilateral lower extremity pitting edema,no cyanosis, or clubbing.  Profound diffuse muscular wasting NEUROLOGIC: Cranial nerves II through XII are intact. MAES. Gait not checked.  PSYCHIATRIC: The patient is alert and oriented x 3.  SKIN: No obvious rash, lesion, or ulcer.   LABORATORY PANEL:   CBC Recent Labs  Lab 10/07/17 0928 10/12/17 1640  WBC 6.4 1.7*  HGB 8.1* 8.0*  HCT 25.4* 24.9*  PLT 139* 71*  MCV 72.6* 70.4*  MCH 23.2* 22.7*  MCHC 32.0 32.2  RDW 19.9* 19.9*  LYMPHSABS 0.7* 0.2*  MONOABS 0.3 0.2  EOSABS 0.0 0.0  BASOSABS 0.0 0.0   ------------------------------------------------------------------------------------------------------------------  Chemistries  Recent Labs  Lab 10/07/17 0928 10/12/17 1640  NA 136 141  K 4.4 4.1  CL 106 113*  CO2 23 20*  GLUCOSE 187* 196*  BUN 36* 53*  CREATININE 2.86* 2.54*  CALCIUM 7.0* 7.0*  AST 38 65*  ALT 25 41  ALKPHOS 67 61  BILITOT 0.9 0.8   ------------------------------------------------------------------------------------------------------------------ estimated creatinine clearance is 28.4 mL/min (A) (by C-G formula based on SCr of 2.54 mg/dL (H)). ------------------------------------------------------------------------------------------------------------------ No results for input(s): TSH, T4TOTAL, T3FREE, THYROIDAB in the last 72 hours.  Invalid input(s): FREET3   Coagulation profile No results for input(s): INR, PROTIME in the last 168 hours. ------------------------------------------------------------------------------------------------------------------- No results for input(s): DDIMER in the last 72 hours. -------------------------------------------------------------------------------------------------------------------  Cardiac Enzymes Recent Labs  Lab 10/12/17 1640  TROPONINI 1.22*    ------------------------------------------------------------------------------------------------------------------ Invalid input(s): POCBNP  ---------------------------------------------------------------------------------------------------------------  Urinalysis    Component Value Date/Time   COLORURINE YELLOW (A) 09/11/2017 0542   APPEARANCEUR CLOUDY (A) 09/11/2017 0542   APPEARANCEUR Clear 05/06/2012 0855   LABSPEC 1.010 09/11/2017 0542   LABSPEC 1.009 05/06/2012 0855   PHURINE 5.0 09/11/2017 0542   GLUCOSEU NEGATIVE 09/11/2017 0542   GLUCOSEU Negative 05/06/2012 0855   HGBUR NEGATIVE 09/11/2017 0542   BILIRUBINUR NEGATIVE 09/11/2017 0542   BILIRUBINUR Negative 05/06/2012 0855   KETONESUR NEGATIVE 09/11/2017 0542   PROTEINUR 30 (A) 09/11/2017 0542   NITRITE NEGATIVE 09/11/2017 0542   LEUKOCYTESUR LARGE (A) 09/11/2017 0542   LEUKOCYTESUR Negative 05/06/2012 0855     RADIOLOGY: Dg Chest Portable 1 View  Result Date: 10/12/2017 CLINICAL DATA:  Shortness of breath. EXAM: PORTABLE CHEST 1 VIEW COMPARISON:  Chest x-ray dated October 07, 2017. FINDINGS: Unchanged left chest wall AICD. Stable cardiomegaly. Mild central vascular congestion. Unchanged retrocardiac opacity. No large pleural effusion. No pneumothorax. No acute osseous abnormality. IMPRESSION: 1. Stable cardiomegaly and mild central  vascular congestion. No overt pulmonary edema. 2. Unchanged left lower lobe pneumonia. Electronically Signed   By: Titus Dubin M.D.   On: 10/12/2017 17:54    EKG: Orders placed or performed during the hospital encounter of 10/12/17  . ED EKG  . ED EKG    IMPRESSION AND PLAN: 1 acute hypoxic respiratory failure Secondary to HCAP, influenza, and acute on chronic systolic congestive heart failure exacerbation in patient with immunocompromise state Continue BiPAP with weaning as tolerated, supplemental oxygen as needed  2 acute HCAP Failed outpatient treatment-was on  Rocephin/Augmentin/Levaquin Placed on pneumonia protocol, cefepime/vancomycin, follow-up on cultures  3 acute on chronic systolic congestive heart failure exacerbation Exacerbated by pneumonia, influenza IV Lasix twice daily, strict I&O monitoring, daily weights, statin therapy, Coreg, aspirin, lisinopril Echocardiogram from outside hospital noted for ejection fraction 30%  4 chronic multiple myeloma On chemotherapy-last treatment September 29, 2017 Consult oncology given pancytopenia  5 chronic diabetes mellitus type 2 Sliding scale insulin with Accu-Cheks per routine, hold metformin  6 chronic cardiomyopathy Status post AICD, chronic left bundle branch block Plan of care as stated above  7 chronic kidney disease stage III Status post right nephrectomy Avoid nephrotoxic agents, strict I&O monitoring  Full code Condition guarded Long-term prognosis dismal    All the records are reviewed and case discussed with ED provider. Management plans discussed with the patient, family and they are in agreement.  CODE STATUS:full Code Status History    Date Active Date Inactive Code Status Order ID Comments User Context   09/11/2017 03:08 09/16/2017 20:28 Full Code [id number]  Amelia Jo, MD Inpatient   09/04/2017 17:55 09/08/2017 18:54 Full Code [id number]  [name], MD ED       TOTAL TIME TAKING CARE OF THIS PATIENT: 45 minutes of critical care time was used to discuss plan of care with subspecialty-ED staff, ancillary staff, and sensitives, the patient, the patient's family with all questions answered   Avel Peace Lenard Kampf M.D on 10/12/2017   Between 7am to 6pm - Pager - [phone]  After 6pm go to [email or web address] - password [email or web address] [facility]  Office  [phone]  CC: Primary care physician; [name], MD   Note: This dictation was prepared with Dragon dictation along with smaller phrase technology. Any transcriptional errors that result from  this process are unintentional.

## 2017-10-12 NOTE — ED Notes (Signed)
Pt wet in bed at this time and changed.

## 2017-10-12 NOTE — Progress Notes (Signed)
ANTIBIOTIC CONSULT NOTE - INITIAL  Pharmacy Consult for Vancomycin, Cefepime Indication: pneumonia  No Known Allergies  Patient Measurements: Weight: 200 lb (90.7 kg) Adjusted Body Weight: 82.8 kg   Vital Signs: BP: 112/87 (02/17 2015) Pulse Rate: 73 (02/17 1915) Intake/Output from previous day: No intake/output data recorded. Intake/Output from this shift: No intake/output data recorded.  Labs: Recent Labs    10/12/17 1640  WBC 1.7*  HGB 8.0*  PLT 71*  CREATININE 2.54*   Estimated Creatinine Clearance: 28.4 mL/min (A) (by C-G formula based on SCr of 2.54 mg/dL (H)). No results for input(s): VANCOTROUGH, VANCOPEAK, VANCORANDOM, GENTTROUGH, GENTPEAK, GENTRANDOM, TOBRATROUGH, TOBRAPEAK, TOBRARND, AMIKACINPEAK, AMIKACINTROU, AMIKACIN in the last 72 hours.   Microbiology: No results found for this or any previous visit (from the past 720 hour(s)).  Medical History: Past Medical History:  Diagnosis Date  . AICD (automatic cardioverter/defibrillator) present   . Anemia   . Blockage of coronary artery of heart ([place])   . Cancer ([place])   . CHF (congestive heart failure) ([place])   . Chronic kidney disease 05/22/2017   nephrectomy-right  . Diabetes mellitus without complication ([place])   . Dysrhythmia   . Heart murmur   . Hypertension   . Hypochloremia   . LBBB (left bundle branch block)   . Neuropathy   . Presence of permanent cardiac pacemaker   . Right renal mass   . Smoldering myeloma ([facility])     Medications:  Scheduled:  . [START ON 10/13/2017] insulin aspart  0-20 Units Subcutaneous TID WC  . insulin aspart  0-5 Units Subcutaneous QHS  . oseltamivir  30 mg Oral BID   Assessment: CrCl = 28.4 ml/min ke = 0.028 hr-1 T1/2 = 24.8 hrs  Vd = 63.5 L   Goal of Therapy:  Vancomycin trough level 15-20 mcg/ml  Plan:  Expected duration 7 days with resolution of temperature and/or normalization of WBC   Vancomycin 1 gm IV X 1 given on 2/17 @ 20:00. Vancomycin 1 gm IV  Q24H ordered to start on 2/18 @ 0200, ~ 6 hrs after 1st dose (stacked dosing).  This pt will reach [facility] by 2/22 @ 20:00. Will draw 1st trough on 2/21 @ 0130, which will not be at Css.   Will continue Cefepime 2 gm IV Q24H on 2/18 @ 18:00.   Kanoe Wanner D 10/12/2017,9:05 PM

## 2017-10-12 NOTE — ED Triage Notes (Signed)
Pt in via [place] due to respiratory distress, per EMS, pt 88% on NRB, on CPAP upon arrival.  Pt currently being treated for PNA and norovirus.  Pt alert upon arrival.  EDP to bedside.

## 2017-10-12 NOTE — ED Provider Notes (Signed)
[facility] Emergency Department Provider Note ____________________________________________   First MD Initiated Contact with Patient 10/12/17 1701     (approximate)  I have reviewed the triage vital signs and the nursing notes.   HISTORY  Chief Complaint Respiratory Distress  History of present illness is severely limited due to respiratory distress and possible altered mental status.  HPI Adam Wall is a 72 y.o. male with past medical history as noted below including CHF who presents from nursing facility with respiratory distress, relatively acute onset within the last several hours.  Per EMS, the patient was being treated for pneumonia and neurovirus, and was getting fluids and antibiotics when he acutely became short of breath.  Patient is unable to provide any history at this time.  Past Medical History:  Diagnosis Date  . AICD (automatic cardioverter/defibrillator) present   . Anemia   . Blockage of coronary artery of heart ([place])   . Cancer ([place])   . CHF (congestive heart failure) ([place])   . Chronic kidney disease 05/22/2017   nephrectomy-right  . Diabetes mellitus without complication ([place])   . Dysrhythmia   . Heart murmur   . Hypertension   . Hypochloremia   . LBBB (left bundle branch block)   . Neuropathy   . Presence of permanent cardiac pacemaker   . Right renal mass   . 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 Pressure injury of skin 09/12/2017  . Back pain, acute 09/11/2017  . Dysphagia 09/04/2017  . Goals of care, counseling/discussion 08/18/2017  . Multiple myeloma not having achieved remission ([place]) 08/18/2017  . Multiple myeloma ([place]) 08/18/2017  . Adenopathy   . SOBOE (shortness of breath on exertion) 03/13/2017  . Renal mass 03/03/2017  . 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 Iron deficiency anemia 02/22/2017  . Atherosclerosis of native artery of both lower  extremities with intermittent claudication ([place]) 02/21/2017  . Atherosclerosis of native arteries of extremity with intermittent claudication ([place]) 07/08/2016  . Hyperlipidemia 07/08/2016  . Coronary atherosclerosis 07/08/2016  . Diabetes ([place]) 07/08/2016  . Bilateral leg edema 05/09/2015  . Biventricular ICD (implantable cardioverter-defibrillator) in place 05/09/2015  . Biventricular implantable cardioverter-defibrillator in situ 04/12/2015  . Acute combined systolic and diastolic congestive heart failure ([place]) 04/11/2015  . Essential hypertension 03/30/2015  . Left carotid bruit 03/30/2015  . Microcytic anemia 03/30/2015  . Weak pulse 03/30/2015  . Mitral valve insufficiency 03/22/2015  . Moderate mitral insufficiency 03/22/2015  . Chronic systolic CHF (congestive heart failure), NYHA class 2 ([place]) 10/25/2014  . Chronic systolic heart failure ([place]) 10/25/2014  . Acute kidney injury (nontraumatic) ([place]) 10/18/2014  . Complicated UTI (urinary tract infection) 10/18/2014  . Pyelonephritis 10/18/2014  . Congestive cardiomyopathy ([place]) 05/11/2014  . Colon polyp 01/01/2013  . Benign prostatic hyperplasia without lower urinary tract symptoms 06/12/2012  . Elevated prostate specific antigen (PSA) 06/12/2012  . Chronic anemia 03/18/2012  . Referral of patient 03/18/2012  . Sepsis due to urinary tract infection ([place]) 03/18/2012  . Sepsis secondary to UTI ([place]) 03/18/2012  . Elevated PSA 03/17/2012  . Hypertension 03/17/2012  . LBBB (left bundle branch block) 03/17/2012  . Left bundle branch block (LBBB) 03/17/2012  . Microalbuminuria 03/17/2012  . Retinopathy 03/17/2012  . Atherosclerosis 05/28/2011    Past Surgical History:  Procedure Laterality Date  . CARDIAC SURGERY    . CORONARY ARTERY BYPASS GRAFT    . ENDOBRONCHIAL ULTRASOUND N/A 04/03/2017  Procedure: ENDOBRONCHIAL ULTRASOUND;  Surgeon: Flora Lipps, MD;  Location: [facility] ORS;  Service: Cardiopulmonary;  Laterality: N/A;  .  Heart Bypass    . IR KYPHO LUMBAR INC FX REDUCE BONE BX UNI/BIL CANNULATION INC/IMAGING  09/12/2017  . PACEMAKER PLACEMENT     ICD    Prior to Admission medications   Medication Sig Start Date End Date Taking? Authorizing Provider  acetaminophen (TYLENOL) 325 MG tablet Take 2 tablets (650 mg total) by mouth every 6 (six) hours as needed for mild pain (or Fever >/= 101). 09/16/17  Yes Wieting, Richard, MD  acyclovir (ZOVIRAX) 400 MG tablet Take 1 tablet (400 mg total) by mouth daily. 08/18/17  Yes Sindy Guadeloupe, MD  albuterol (PROVENTIL) (2.5 MG/3ML) 0.083% nebulizer solution Take 2.5 mg by nebulization every 4 (four) hours as needed for wheezing or shortness of breath.   Yes [provider]  alendronate (FOSAMAX) 70 MG tablet Take 1 tablet (70 mg total) by mouth once a week. Take with a full glass of water on an empty stomach. 09/16/17  Yes Wieting, Richard, MD  atorvastatin (LIPITOR) 40 MG tablet Take 40 mg by mouth at bedtime. 02/27/17  Yes [provider]  calcium-vitamin D (OSCAL WITH D) 500-200 MG-UNIT tablet Take 1 tablet by mouth 3 (three) times daily with meals. Patient taking differently: Take 1 tablet by mouth 2 (two) times daily.  09/16/17  Yes Wieting, Richard, MD  carvedilol (COREG) 25 MG tablet Take 25 mg by mouth 2 (two) times daily with a meal.   Yes [provider]  cefTRIAXone (ROCEPHIN) 1-3.74 GM-%(50ML) IVPB Inject 1 g into the vein daily. 10/10/17 10/17/17 Yes [provider]  diclofenac sodium (VOLTAREN) 1 % GEL Apply 4 g topically 4 (four) times daily.   Yes [provider]  docusate sodium (COLACE) 100 MG capsule Take 1 capsule (100 mg total) by mouth 2 (two) times daily. 09/16/17  Yes Wieting, Richard, MD  fentaNYL (DURAGESIC - DOSED MCG/HR) 12 MCG/HR Place 1 patch (12.5 mcg total) onto the skin every 3 (three) days. 09/16/17  Yes Wieting, Richard, MD  furosemide (LASIX) 40 MG tablet Take 20-40 mg by mouth daily. Pt takes 1 pill one day  and alternates with 0.5 pill the next day ( this is alternating dose every other day)   Yes [provider]  gabapentin (NEURONTIN) 100 MG capsule Take 100 mg by mouth daily as needed (pain).    Yes [provider]  insulin NPH-regular Human (NOVOLIN 70/30) (70-30) 100 UNIT/ML injection 8u (AM) and 8u (PM) Patient taking differently: Inject 8 Units into the skin 2 (two) times daily with a meal.  09/16/17  Yes Wieting, Richard, MD  levofloxacin (LEVAQUIN) 250 MG tablet Take 250 mg by mouth daily. 10/08/17 10/15/17 Yes [provider]  loperamide (IMODIUM A-D) 2 MG tablet Take 2 mg by mouth every 6 (six) hours as needed for diarrhea or loose stools.   Yes [provider]  menthol-cetylpyridinium (CEPACOL) 3 MG lozenge Take 1 lozenge by mouth 4 (four) times daily -  before meals and at bedtime. 10/11/17 10/15/17 Yes [provider]  ondansetron (ZOFRAN) 4 MG tablet Take 4 mg by mouth every 6 (six) hours as needed for nausea or vomiting.   Yes [provider]  oxyCODONE (OXY IR/ROXICODONE) 5 MG immediate release tablet Take 1 tablet (5 mg total) by mouth every 4 (four) hours as needed for moderate pain or severe pain. 09/16/17  Yes [name], MD  potassium chloride SA (K-DUR,KLOR-CON) 20 MEQ tablet Take 20 mEq by mouth 2 (two) times daily.   Yes [provider]  tamsulosin (FLOMAX) 0.4 MG CAPS capsule Take 0.4 mg by mouth daily.   Yes [provider]  traZODone (DESYREL) 50 MG tablet Take 0.5 tablets (25 mg total) by mouth at bedtime as needed for sleep. Patient taking differently: Take 50 mg by mouth at bedtime.  09/16/17  Yes Wieting, Richard, MD  vitamin B-12 (CYANOCOBALAMIN) 1000 MCG tablet Take 1,000 mcg by mouth daily.   Yes [provider]  vitamin C (ASCORBIC ACID) 500 MG tablet Take 500 mg by mouth daily.   Yes [provider]  amoxicillin-clavulanate (AUGMENTIN) 875-125 MG tablet Take 1 tablet by mouth  every 12 (twelve) hours. Patient not taking: Reported on 10/07/2017 09/16/17   [name], MD  cyclobenzaprine (FLEXERIL) 5 MG tablet Take 1 tablet (5 mg total) by mouth 3 (three) times daily as needed for muscle spasms. Patient not taking: Reported on 10/12/2017 09/16/17   [name], MD  lenalidomide (REVLIMID) 2.5 MG capsule Take 1 capsule (2.5 mg total) by mouth daily. For 7 days and then 2 weeks off, this is dose decrease. Patient not taking: Reported on 10/12/2017 09/23/17   Sindy Guadeloupe, MD  prochlorperazine (COMPAZINE) 10 MG tablet Take 1 tablet (10 mg total) by mouth every 6 (six) hours as needed (Nausea or vomiting). 08/18/17 09/04/17  Sindy Guadeloupe, MD    Allergies Patient has no known allergies.  Family History  Adopted: Yes  Problem Relation Age of Onset  . Heart failure Paternal Grandmother     Social History Social History   Tobacco Use  . Smoking status: Former Smoker    Years: 10.00    Types: Cigars    Last attempt to quit: 10/04/1993    Years since quitting: 24.0  . Smokeless tobacco: Never Used  . Tobacco comment: 3 CIGARS PER DAY  Substance Use Topics  . Alcohol use: No  . Drug use: No    Review of Systems Level V caveat: Unable to obtain review of systems due to respiratory distress and possible altered mental status.    ____________________________________________   PHYSICAL EXAM:  VITAL SIGNS: ED Triage Vitals  Enc Vitals Group     BP 10/12/17 1703 (!) 155/73     Pulse Rate 10/12/17 1703 74     Resp 10/12/17 1703 (!) 37     Temp --      Temp src --      SpO2 10/12/17 1703 94 %     Weight 10/12/17 1704 200 lb (90.7 kg)     Height --      Head Circumference --      Peak Flow --      Pain Score 10/12/17 1704 0     Pain Loc --      Pain Edu? --      Excl. in [place]? --     Constitutional: Alert, slightly confused.  Able to answer questions.  Eyes: Conjunctivae are normal.  EOMI. Head: Atraumatic. Nose: No  congestion/rhinnorhea. Mouth/Throat: Mucous membranes are moist.   Neck: Normal range of motion.  Cardiovascular: Normal rate, regular rhythm. Grossly normal heart sounds.  Good peripheral circulation. Respiratory: Moderate respiratory distress.  Rales throughout lung fields bilaterally. Gastrointestinal: Soft and nontender. No distention.  Genitourinary: No CVA tenderness. Musculoskeletal: Mild bilateral lower extremity edema.  Moderate edema to left upper extremity.  Extremities warm and well perfused.  Neurologic: Motor intact in all extremities. Skin:  Skin is warm and dry. No rash noted. Psychiatric: Unable to assess due to respiratory distress.  ____________________________________________   LABS (all labs ordered are listed, but only abnormal results are displayed)  Labs Reviewed  BLOOD GAS, VENOUS - Abnormal; Notable for the following components:      Result Value   pCO2, Ven 31 (*)    Acid-base deficit 3.2 (*)    All other components within normal limits  CBC WITH DIFFERENTIAL/PLATELET - Abnormal; Notable for the following components:   WBC 1.7 (*)    RBC 3.54 (*)    Hemoglobin 8.0 (*)    HCT 24.9 (*)    MCV 70.4 (*)    MCH 22.7 (*)    RDW 19.9 (*)    Platelets 71 (*)    Neutro Abs 1.3 (*)    Lymphs Abs 0.2 (*)    All other components within normal limits  TROPONIN I - Abnormal; Notable for the following components:   Troponin I 1.22 (*)    All other components within normal limits  BRAIN NATRIURETIC PEPTIDE - Abnormal; Notable for the following components:   B Natriuretic Peptide 534.0 (*)    All other components within normal limits  COMPREHENSIVE METABOLIC PANEL - Abnormal; Notable for the following components:   Chloride 113 (*)    CO2 20 (*)    Glucose, Bld 196 (*)    BUN 53 (*)    Creatinine, Ser 2.54 (*)    Calcium 7.0 (*)    Total Protein 5.6 (*)    Albumin 1.9 (*)    AST 65 (*)    GFR calc non Af Amer 24 (*)    GFR calc Af Amer 27 (*)    All  other components within normal limits  URINALYSIS, COMPLETE (UACMP) WITH MICROSCOPIC   ____________________________________________  EKG  ED ECG REPORT I, Arta Silence, the attending physician, personally viewed and interpreted this ECG.  Date: 10/12/2017 EKG Time: 16:59 Rate: 80 Rhythm: AV dual paced rhythm QRS Axis: normal Intervals: normal ST/T Wave abnormalities: Nonspecific T wave abnormalities laterally Narrative Interpretation: Nonspecific; unable to pull up old EKG for comparison  ____________________________________________  RADIOLOGY  CXR: Pulmonary vascular congestion and left lower lobe pneumonia  ____________________________________________   PROCEDURES  Procedure(s) performed: No  Procedures  Critical Care performed: Yes  CRITICAL CARE Performed by: Arta Silence   Total critical care time: 40 minutes  Critical care time was exclusive of separately billable procedures and treating other patients.  Critical care was necessary to treat or prevent imminent or life-threatening deterioration.  Critical care was time spent personally by me on the following activities: development of treatment plan with patient and/or surrogate as well as nursing, discussions with consultants, evaluation of patient's response to treatment, examination of patient, obtaining history from patient or surrogate, ordering and performing treatments and interventions, ordering and review of laboratory studies, ordering and review of radiographic studies, pulse oximetry and re-evaluation of patient's condition. ____________________________________________   INITIAL IMPRESSION / ASSESSMENT AND PLAN / ED COURSE  Pertinent labs & imaging results that were available during my care of the patient were reviewed by me and considered in my medical decision making (see chart for details).  72 year old male with past medical history as noted above presents with relatively acute  onset respiratory distress.  Per EMS, the patient was being treated for pneumonia and neurovirus and receiving IV fluids.  I reviewed the medical records in epic;  patient's most recent admission was last month for acute on chronic renal insufficiency.  On exam, the patient is in respiratory distress, but with O2 sat in the high 90s on BiPAP.  He has rales throughout both lung fields, and some peripheral edema.  There is also some edema to his left upper extremity which is apparently the site of a prior IV, and may be due to infiltration from fluids.  Overall differential includes most likely acute CHF/pulmonary edema, versus worsening pneumonia, or less likely component of COPD.  I have lower suspicion for cardiac cause.  Plan for continued BiPAP, IV Lasix, lab workup, chest x-ray, and reassess.  No indication for nitro at this time given patient's blood pressure.   ----------------------------------------- 7:16 PM on 10/12/2017 -----------------------------------------  Chest x-ray shows left lower lobe pneumonia and some pulmonary vascular congestion although given patient's clinical presentation and acute CHF superimposed on pneumonia is still most likely cause.  Patient is now breathing somewhat more comfortably after several hours on the BiPAP with O2 sat 100%.  Still tachypneic to 30s.  VBG is reassuring.  Empiric antibiotics given for healthcare associated pneumonia.  I signed the patient out to the hospitalist Dr. Clyda Hurdle for admission.   ____________________________________________   FINAL CLINICAL IMPRESSION(S) / ED DIAGNOSES  Final diagnoses:  Respiratory distress  Acute congestive heart failure, unspecified heart failure type ([place])  Healthcare-associated pneumonia      NEW MEDICATIONS STARTED DURING THIS VISIT:  New Prescriptions   No medications on file     Note:  This document was prepared using Dragon voice recognition software and may include unintentional dictation  errors.     Arta Silence, MD 10/12/17 1925

## 2017-10-12 NOTE — Progress Notes (Signed)
eLink Physician-Brief Progress Note Patient Name: Adam Wall DOB: Jun 17, 1946 MRN: [id number]   Date of Service  10/12/2017  HPI/Events of Note  72 yo male with PMH of CAD, CHF, CKD, Multiple Myeloma - now pancytopenic s/p chemo therapy, DM, HTN, LBBB and recent outpaitent Rx for pneumonia with Levaquin and Augmentin. Admitted now with what is felt to be LLL HCAP and exacerbation of CHF. Increased WOB --> now on BiPAP. Currently on Vancomycin, Cefipime and Lasix. VSS.  eICU Interventions  No new orders.      Intervention Category Evaluation Type: New Patient Evaluation  Lysle Dingwall 10/12/2017, 10:03 PM

## 2017-10-12 NOTE — Progress Notes (Signed)

## 2017-10-12 NOTE — Progress Notes (Signed)
PHARMACY NOTE:  ANTIMICROBIAL RENAL DOSAGE ADJUSTMENT  Current antimicrobial regimen includes a mismatch between antimicrobial dosage and estimated renal function.  As per policy approved by the Pharmacy & Therapeutics and Medical Executive Committees, the antimicrobial dosage will be adjusted accordingly.  Current antimicrobial dosage:   Cefepime 1 gm IV Q8H   Indication: HCAP   Renal Function:  Estimated Creatinine Clearance: 28.4 mL/min (A) (by C-G formula based on SCr of 2.54 mg/dL (H)). []      On intermittent HD, scheduled: []      On CRRT    Antimicrobial dosage has been changed to:  Cefepime 1 gm IV Q12H  Additional comments:   Thank you for allowing pharmacy to be a part of this patient's care.  Adam Wall D, [facility] 10/12/2017 10:07 PM

## 2017-10-13 ENCOUNTER — Inpatient Hospital Stay
Admit: 2017-10-13 | Discharge: 2017-10-13 | Disposition: A | Discharge status: Home / Self Care | Payer: Medicare Other | Attending: Infectious Diseases | Admitting: Infectious Diseases

## 2017-10-13 ENCOUNTER — Other Ambulatory Visit: Payer: Self-pay

## 2017-10-13 DIAGNOSIS — A4102 Sepsis due to Methicillin resistant Staphylococcus aureus: Secondary | ICD-10-CM

## 2017-10-13 DIAGNOSIS — N183 Chronic kidney disease, stage 3 (moderate): Secondary | ICD-10-CM

## 2017-10-13 DIAGNOSIS — J15212 Pneumonia due to Methicillin resistant Staphylococcus aureus: Secondary | ICD-10-CM

## 2017-10-13 DIAGNOSIS — C9000 Multiple myeloma not having achieved remission: Secondary | ICD-10-CM

## 2017-10-13 DIAGNOSIS — D61818 Other pancytopenia: Secondary | ICD-10-CM

## 2017-10-13 DIAGNOSIS — N179 Acute kidney failure, unspecified: Secondary | ICD-10-CM

## 2017-10-13 DIAGNOSIS — J1100 Influenza due to unidentified influenza virus with unspecified type of pneumonia: Secondary | ICD-10-CM

## 2017-10-13 DIAGNOSIS — D631 Anemia in chronic kidney disease: Secondary | ICD-10-CM

## 2017-10-13 DIAGNOSIS — J9601 Acute respiratory failure with hypoxia: Secondary | ICD-10-CM

## 2017-10-13 LAB — INFLUENZA PANEL BY PCR (TYPE A & B)
Influenza A By PCR: POSITIVE — AB
Influenza B By PCR: NEGATIVE

## 2017-10-13 LAB — BASIC METABOLIC PANEL
Anion gap: 11 (ref 5–15)
BUN: 57 mg/dL — ABNORMAL HIGH (ref 6–20)
CHLORIDE: 113 mmol/L — AB (ref 101–111)
CO2: 17 mmol/L — ABNORMAL LOW (ref 22–32)
CREATININE: 2.52 mg/dL — AB (ref 0.61–1.24)
Calcium: 6.9 mg/dL — ABNORMAL LOW (ref 8.9–10.3)
GFR, EST AFRICAN AMERICAN: 28 mL/min — AB (ref >60)
GFR, EST NON AFRICAN AMERICAN: 24 mL/min — AB (ref >60)
Glucose, Bld: 262 mg/dL — ABNORMAL HIGH (ref 65–99)
Potassium: 4 mmol/L (ref 3.5–5.1)
SODIUM: 141 mmol/L (ref 135–145)

## 2017-10-13 LAB — GLUCOSE, CAPILLARY
GLUCOSE-CAPILLARY: 250 mg/dL — AB (ref 65–99)
GLUCOSE-CAPILLARY: 239 mg/dL — AB (ref 65–99)
Glucose-Capillary: 159 mg/dL — ABNORMAL HIGH (ref 65–99)
Glucose-Capillary: 202 mg/dL — ABNORMAL HIGH (ref 65–99)

## 2017-10-13 LAB — BLOOD CULTURE ID PANEL (REFLEXED)
Acinetobacter baumannii: NOT DETECTED
CANDIDA KRUSEI: NOT DETECTED
Candida albicans: NOT DETECTED
Candida glabrata: NOT DETECTED
Candida parapsilosis: NOT DETECTED
Candida tropicalis: NOT DETECTED
ENTEROCOCCUS SPECIES: NOT DETECTED
ESCHERICHIA COLI: NOT DETECTED
Enterobacter cloacae complex: NOT DETECTED
Enterobacteriaceae species: NOT DETECTED
Haemophilus influenzae: NOT DETECTED
Klebsiella oxytoca: NOT DETECTED
Klebsiella pneumoniae: NOT DETECTED
LISTERIA MONOCYTOGENES: NOT DETECTED
Methicillin resistance: DETECTED — AB
Neisseria meningitidis: NOT DETECTED
PROTEUS SPECIES: NOT DETECTED
Pseudomonas aeruginosa: NOT DETECTED
SERRATIA MARCESCENS: NOT DETECTED
STAPHYLOCOCCUS AUREUS BCID: DETECTED — AB
STAPHYLOCOCCUS SPECIES: DETECTED — AB
STREPTOCOCCUS AGALACTIAE: NOT DETECTED
Streptococcus pneumoniae: NOT DETECTED
Streptococcus pyogenes: NOT DETECTED
Streptococcus species: NOT DETECTED

## 2017-10-13 LAB — BLOOD GAS, VENOUS
ACID-BASE DEFICIT: 3.2 mmol/L — AB (ref 0.0–2.0)
BICARBONATE: 20.6 mmol/L (ref 20.0–28.0)
O2 SAT: 71 %
PATIENT TEMPERATURE: 37
PCO2 VEN: 31 mmHg — AB (ref 44.0–60.0)
PO2 VEN: 36 mmHg (ref 32.0–45.0)
pH, Ven: 7.43 (ref 7.250–7.430)

## 2017-10-13 LAB — CBC
HCT: 23.7 % — ABNORMAL LOW (ref 40.0–52.0)
HEMOGLOBIN: 7.8 g/dL — AB (ref 13.0–18.0)
MCH: 23 pg — ABNORMAL LOW (ref 26.0–34.0)
MCHC: 33.1 g/dL (ref 32.0–36.0)
MCV: 69.7 fL — ABNORMAL LOW (ref 80.0–100.0)
Platelets: 71 10*3/uL — ABNORMAL LOW (ref 150–440)
RBC: 3.4 MIL/uL — AB (ref 4.40–5.90)
RDW: 20.3 % — ABNORMAL HIGH (ref 11.5–14.5)
WBC: 2.4 10*3/uL — ABNORMAL LOW (ref 3.8–10.6)

## 2017-10-13 LAB — MAGNESIUM: MAGNESIUM: 1.6 mg/dL — AB (ref 1.7–2.4)

## 2017-10-13 LAB — MRSA PCR SCREENING: MRSA by PCR: POSITIVE — AB

## 2017-10-13 MED ORDER — IPRATROPIUM-ALBUTEROL 0.5-2.5 (3) MG/3ML IN SOLN
3.0000 mL | RESPIRATORY_TRACT | Status: DC
  Administered 2017-10-13 (×2): 3 mL via RESPIRATORY_TRACT
  Filled 2017-10-13 (×2): qty 3

## 2017-10-13 MED ORDER — IPRATROPIUM-ALBUTEROL 0.5-2.5 (3) MG/3ML IN SOLN
3.0000 mL | Freq: Four times a day (QID) | RESPIRATORY_TRACT | Status: DC
  Administered 2017-10-13 – 2017-10-17 (×14): 3 mL via RESPIRATORY_TRACT
  Filled 2017-10-13 (×14): qty 3

## 2017-10-13 MED ORDER — BUDESONIDE 0.25 MG/2ML IN SUSP
0.2500 mg | Freq: Four times a day (QID) | RESPIRATORY_TRACT | Status: DC
Start: 2017-10-13 — End: 2017-10-17
  Administered 2017-10-13 – 2017-10-17 (×15): 0.25 mg via RESPIRATORY_TRACT
  Filled 2017-10-13 (×15): qty 2

## 2017-10-13 MED ORDER — HYDRALAZINE HCL 20 MG/ML IJ SOLN
10.0000 mg | INTRAMUSCULAR | Status: DC | PRN
  Administered 2017-10-13: 10 mg via INTRAVENOUS
  Filled 2017-10-13: qty 1

## 2017-10-13 MED ORDER — VANCOMYCIN HCL IN DEXTROSE 1-5 GM/200ML-% IV SOLN
1000.0000 mg | INTRAVENOUS | Status: DC
  Administered 2017-10-13: 1000 mg via INTRAVENOUS
  Filled 2017-10-13 (×2): qty 200

## 2017-10-13 MED ORDER — FUROSEMIDE 10 MG/ML IJ SOLN
80.0000 mg | Freq: Every day | INTRAMUSCULAR | Status: DC
  Administered 2017-10-14 – 2017-10-16 (×3): 80 mg via INTRAVENOUS
  Filled 2017-10-13 (×4): qty 8

## 2017-10-13 MED ORDER — MAGNESIUM SULFATE 2 GM/50ML IV SOLN
2.0000 g | Freq: Once | INTRAVENOUS | Status: AC
  Administered 2017-10-13: 2 g via INTRAVENOUS
  Filled 2017-10-13: qty 50

## 2017-10-13 MED ORDER — MUPIROCIN 2 % EX OINT
TOPICAL_OINTMENT | Freq: Every day | CUTANEOUS | Status: DC
  Administered 2017-10-13 – 2017-10-16 (×4): via TOPICAL
  Filled 2017-10-13 (×2): qty 22

## 2017-10-13 MED ORDER — HYDRALAZINE HCL 20 MG/ML IJ SOLN: 10.0000 mg | INTRAMUSCULAR | Status: DC | PRN

## 2017-10-13 NOTE — Consult Note (Signed)
[facility] Nurse wound consult note Reason for Consult: Stage 3 pressure injury to coccyx and bilateral upper buttocks, present on admission.  Patient states this area had resolved but opened up last week when he started getting sick. Albumin is 1.9.  Impaired healing present.  Patient encouraged to consume protein and as many calories as he can. Verbalizes understanding.  Bilateral unstageable pressure injury to heels.  Dry stable eschar to both sides.   Wound type: pressure injury with impaired ability to heal. Pressure Injury POA: Yes Measurement: 2.4 cm x 4.2 cm x 0.2 cm coccyx and bilateral upper buttocks Left heel:  2 cm x 2.2. Cm dry stable eschar Right heel (Extends to lateral heel):  3 cm x 4.2 cm dry stable eschar Wound PTE:LMRAJH:  Ruddy red Drainage (amount, consistency, odor) minimal serosanguinous  No odor Periwound:intact Dressing procedure/placement/frequency:Cleanse wound to coccyx with NS and pat dry.  Apply mupirocin ointment to wound bed. Cover with ABD pad and tape.  Change daily. Paint bilateral heels with betadine daily.  Leave open to air in Prevalon boots.  Will need low air loss mattress if transferred to the floor. Will not follow at this time.  Please re-consult if needed.  Domenic Moras RN BSN [place] Pager [phone]

## 2017-10-13 NOTE — Consult Note (Signed)
[facility] NOTE  Patient Care Team: [name], MD as PCP - General (Pediatrics)  CHIEF COMPLAINTS/PURPOSE OF CONSULTATION:  Multiple myeloma/pancytopenia  HISTORY OF PRESENTING ILLNESS:  Adam Wall 72 y.o.  male multiple medical problems including recently diagnosed multiple myeloma follows up with Dr. Janese Banks on outpatient basis-is currently admitted the hospital for worsening shortness of breath and cough.  Patient has been treated with Revlimid dexamethasone Velcade-on outpatient basis; he has had a partial response with improvement of his M protein/and also kappa light chains.  His treatment has been complicated by mild cytopenias-otherwise tolerating well.   Most recently patient's treatment was interrupted because of cough/shortness of breath with a chest x-ray showed left lower lobe pneumonia.  Patient was treated with Levaquin without any significant improvement.  Given the worsening shortness of breath and cough patient has been admitted to the ICU for further evaluation recommendations.  Patient denies any fevers or chills.  He feels slightly improved since being admitted the hospital.  He complains of neck pain and low back pain.  Requesting pain medication.  ROS: A complete 10 point review of system is done which is negative except mentioned above in history of present illness  MEDICAL HISTORY:  Past Medical History:  Diagnosis Date  . AICD (automatic cardioverter/defibrillator) present   . Anemia   . Blockage of coronary artery of heart ([place])   . Cancer ([place])   . CHF (congestive heart failure) ([place])   . Chronic kidney disease 05/22/2017   nephrectomy-right  . Diabetes mellitus without complication ([place])   . Dysrhythmia   . Heart murmur   . Hypertension   . Hypochloremia   . LBBB (left bundle branch block)   . Neuropathy   . Presence of permanent cardiac pacemaker   . Right renal mass   . 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 CARDIAC SURGERY    . CORONARY ARTERY BYPASS GRAFT    . ENDOBRONCHIAL ULTRASOUND N/A 04/03/2017   Procedure: ENDOBRONCHIAL ULTRASOUND;  Surgeon: Flora Lipps, MD;  Location: [facility] ORS;  Service: Cardiopulmonary;  Laterality: N/A;  . Heart Bypass    . IR KYPHO LUMBAR INC FX REDUCE BONE BX UNI/BIL CANNULATION INC/IMAGING  09/12/2017  . PACEMAKER PLACEMENT     ICD    SOCIAL HISTORY: Social History   Socioeconomic History  . Marital status: Married    Spouse name: Not on file  . Number of children: Not on file  . Years of education: Not on file  . Highest education level: Not on file  Social Needs  . Financial resource strain: Not on file  . Food insecurity - worry: Not on file  . Food insecurity - inability: Not on file  . Transportation needs - medical: Not on file  . Transportation needs - non-medical: Not on file  Occupational History  . Not on file  Tobacco Use  . Smoking status: Former Smoker    Years: 10.00    Types: Cigars    Last attempt to quit: 10/04/1993    Years since quitting: 24.0  . Smokeless tobacco: Never Used  . Tobacco comment: 3 CIGARS PER DAY  Substance and Sexual Activity  . Alcohol use: No  . Drug use: No  . Sexual activity: Not on file  Other Topics Concern  . Not on file  Social History Narrative  . Not on file    FAMILY HISTORY: Family History  Adopted: Yes  Problem Relation Age of Onset  . Heart failure Paternal Grandmother     ALLERGIES:  has No Known Allergies.  MEDICATIONS:  Current Facility-Administered Medications  Medication Dose Route Frequency Provider Last Rate Last Dose  . 0.9 %  sodium chloride infusion  250 mL Intravenous PRN Salary, Montell D, MD      . acetaminophen (TYLENOL) tablet 650 mg  650 mg Oral Q4H PRN Salary, Montell D, MD      . acyclovir (ZOVIRAX) 200 MG capsule 400 mg  400 mg Oral Daily Salary, Montell D, MD   400 mg at 10/13/17 0942  . atorvastatin  (LIPITOR) tablet 40 mg  40 mg Oral QHS Salary, Montell D, MD      . budesonide (PULMICORT) nebulizer solution 0.25 mg  0.25 mg Nebulization Q6H Wilhelmina Mcardle, MD   0.25 mg at 10/13/17 1415  . calcium-vitamin D (OSCAL WITH D) 500-200 MG-UNIT per tablet 1 tablet  1 tablet Oral BID Gorden Harms, MD   1 tablet at 10/13/17 [phone]  . carvedilol (COREG) tablet 25 mg  25 mg Oral BID WC Salary, Montell D, MD   25 mg at 10/13/17 0734  . docusate sodium (COLACE) capsule 100 mg  100 mg Oral BID Loney Hering D, MD   100 mg at 10/13/17 0943  . fentaNYL (DURAGESIC - dosed mcg/hr) 12.5 mcg  12.5 mcg Transdermal Q72H Salary, Montell D, MD   12.5 mcg at 10/13/17 0130  . [START ON 10/14/2017] furosemide (LASIX) injection 80 mg  80 mg Intravenous Daily Wilhelmina Mcardle, MD      . heparin injection 5,000 Units  5,000 Units Subcutaneous Q8H Salary, Montell D, MD   5,000 Units at 10/13/17 1325  . hydrALAZINE (APRESOLINE) injection 10-40 mg  10-40 mg Intravenous Q4H PRN Wilhelmina Mcardle, MD      . insulin aspart (novoLOG) injection 0-20 Units  0-20 Units Subcutaneous TID WC Gorden Harms, MD   7 Units at 10/13/17 1129  . insulin aspart (novoLOG) injection 0-5 Units  0-5 Units Subcutaneous QHS Loney Hering D, MD   2 Units at 10/12/17 2305  . ipratropium-albuterol (DUONEB) 0.5-2.5 (3) MG/3ML nebulizer solution 3 mL  3 mL Nebulization Q6H Merton Border B, MD      . loperamide (IMODIUM) capsule 2 mg  2 mg Oral Q6H PRN Salary, Montell D, MD      . magnesium sulfate IVPB 2 g 50 mL  2 g Intravenous Once Wilhelmina Mcardle, MD      . mupirocin ointment (BACTROBAN) 2 %   Topical Daily Wieting, Richard, MD      . ondansetron [facility]) injection 4 mg  4 mg Intravenous Q6H PRN Salary, Montell D, MD      . oseltamivir (TAMIFLU) capsule 30 mg  30 mg Oral Daily Salary, Montell D, MD   30 mg at 10/13/17 0942  . oxyCODONE (Oxy IR/ROXICODONE) immediate release tablet 5 mg  5 mg Oral Q4H PRN Salary, Montell D, MD   5 mg at  10/13/17 1325  . potassium chloride SA (K-DUR,KLOR-CON) CR tablet 20 mEq  20 mEq Oral BID Loney Hering D, MD   20 mEq at 10/13/17 0942  . tamsulosin (FLOMAX) capsule 0.4 mg  0.4 mg Oral Daily Salary, Montell D, MD   0.4 mg at 10/13/17 0942  . traZODone (DESYREL) tablet 50 mg  50 mg Oral QHS Salary, Avel Peace, MD      . Derrill Memo ON 10/14/2017]  vancomycin (VANCOCIN) IVPB 1000 mg/200 mL premix  1,000 mg Intravenous Q24H Hallaji, Sheema M, RPH      . vitamin B-12 (CYANOCOBALAMIN) tablet 1,000 mcg  1,000 mcg Oral Daily Salary, Montell D, MD   1,000 mcg at 10/13/17 0942  . vitamin C (ASCORBIC ACID) tablet 500 mg  500 mg Oral Daily Salary, Montell D, MD   500 mg at 10/13/17 0942      .  PHYSICAL EXAMINATION:  Vitals:   10/13/17 1100 10/13/17 1146  BP:    Pulse: (!) 106   Resp: 13   Temp:    SpO2: 98% 97%   Filed Weights   10/12/17 1704 10/12/17 2158 10/13/17 0134  Weight: 200 lb (90.7 kg) 193 lb 9 oz (87.8 kg) 193 lb 9 oz (87.8 kg)    GENERAL: Well-nourished well-developed; Alert, no distress and comfortable.   Alone.  EYES: no pallor or icterus OROPHARYNX: no thrush or ulceration. NECK: supple, no masses felt LYMPH:  no palpable lymphadenopathy in the cervical, axillary or inguinal regions LUNGS: decreased breath sounds to auscultation at bases and  No wheeze or crackles HEART/CVS: regular rate & rhythm and no murmurs; No lower extremity edema ABDOMEN: abdomen soft, non-tender and normal bowel sounds Musculoskeletal:no cyanosis of digits and no clubbing  PSYCH: alert & oriented x 3 with fluent speech NEURO: no focal motor/sensory deficits SKIN:  no rashes or significant lesions  LABORATORY DATA:  I have reviewed the data as listed Lab Results  Component Value Date   WBC 2.4 (L) 10/13/2017   HGB 7.8 (L) 10/13/2017   HCT 23.7 (L) 10/13/2017   MCV 69.7 (L) 10/13/2017   PLT 71 (L) 10/13/2017   Recent Labs    09/29/17 1409 10/07/17 0928 10/12/17 1640 10/13/17 0459  NA  132* 136 141 141  K 4.7 4.4 4.1 4.0  CL 103 106 113* 113*  CO2 25 23 20* 17*  GLUCOSE 230* 187* 196* 262*  BUN 27* 36* 53* 57*  CREATININE 1.64* 2.86* 2.54* 2.52*  CALCIUM 8.2* 7.0* 7.0* 6.9*  GFRNONAA 40* 20* 24* 24*  GFRAA 46* 24* 27* 28*  PROT 6.2* 5.8* 5.6*  --   ALBUMIN 2.5* 2.4* 1.9*  --   AST 15 38 65*  --   ALT 15* 25 41  --   ALKPHOS 91 67 61  --   BILITOT 0.5 0.9 0.8  --     RADIOGRAPHIC STUDIES: I have personally reviewed the radiological images as listed and agreed with the findings in the report. Dg Chest 2 View  Result Date: 10/07/2017 CLINICAL DATA:  Fever, productive cough, pleuritic chest pain, fever. History of multiple myeloma, diabetes, coronary artery disease and previous MI, CHF, former smoker. EXAM: CHEST  2 VIEW COMPARISON:  Portable chest x-ray of July 11, 2018 FINDINGS: The lungs are well-expanded. The interstitial markings are patchy and coarse. There is an infiltrate in the left lower lobe posteriorly. The cardiac silhouette remains enlarged. The central pulmonary vascularity is prominent. The ICD is in stable position. There is calcification in the wall of the aortic arch. The sternal wires are intact. Calcified the observed bony thorax is unremarkable. There is a moderate amount of gas and fluid in the upper abdomen. IMPRESSION: Left lower lobe pneumonia. Patchy interstitial density in both lungs has appeared since the previous study and may reflect a pneumonitis type pattern or less likely pulmonary edema. Followup PA and lateral chest X-ray is recommended in 3-4 weeks following trial of antibiotic therapy  to ensure resolution and exclude underlying malignancy. Cardiomegaly.  Previous CABG.  Thoracic aortic atherosclerosis. Electronically Signed   By: David  Martinique M.D.   On: 10/07/2017 14:03   Dg Chest Portable 1 View  Result Date: 10/12/2017 CLINICAL DATA:  Shortness of breath. EXAM: PORTABLE CHEST 1 VIEW COMPARISON:  Chest x-ray dated October 07, 2017.  FINDINGS: Unchanged left chest wall AICD. Stable cardiomegaly. Mild central vascular congestion. Unchanged retrocardiac opacity. No large pleural effusion. No pneumothorax. No acute osseous abnormality. IMPRESSION: 1. Stable cardiomegaly and mild central vascular congestion. No overt pulmonary edema. 2. Unchanged left lower lobe pneumonia. Electronically Signed   By: Titus Dubin M.D.   On: 10/12/2017 17:54    ASSESSMENT & PLAN:   #72 year old male patient with multiple myeloma/chronic kidney disease currently admitted the hospital for worsening shortness of breath cough-  #Multiple myeloma IgG kappa-stage I-currently on RVD lite-partial response noted; with improvement of the M protein to 1.5; also kappa light chains at 47[ from a baseline - IgG-3.1/ K- >1000].  Recommend holding current therapy given the acute infection issues.   #Left lower lobe pneumonia-not improved on outpatient antibiotics; currently on IV antibiotics.  Awaiting ID evaluation.  Question need for a CT of the chest if no improvement of pneumonia.  #Acute on chronic renal failure-creatinine-2.5; baseline around 2.  Multifactorial.  #Pancytopenia-white count 2.4/hemoglobin 7.8 platelets 71-infection/RVD treatment-continue supportive care.  If hemoglobin continues to be less than 8 tomorrow-1 unit of PRBC would be reasonable.  Thank you Dr. Earleen Newport for allowing me to participate in the care of your pleasant patient. Please do not hesitate to contact me with questions or concerns in the interim. Dr.Rao available for consultation starting tomorrow.   All questions were answered. The patient knows to call the clinic with any problems, questions or concerns.    Cammie Sickle, MD 10/13/2017 4:12 PM

## 2017-10-13 NOTE — Consult Note (Signed)
PULMONARY / CRITICAL CARE MEDICINE   Name: Adam Wall MRN: [id number] DOB: June 11, 1946    ADMISSION DATE:  10/12/2017   CONSULTATION DATE:  10/12/2017  REFERRING MD:  DR. Jerelyn Charles  REASON: Acute hypoxic respiratory failure  HISTORY OF PRESENT ILLNESS:  This is a 72 y/o SNF patient with a PMH as indicated below, currently receiving treatment for multiple myeloma,  who presented to the ED via EMS with complaints of acute respiratory distress. When EMS arrived, patient's SPO2 was 88% on a non-rebreather mask. His CXR showed let lower lobe pneumonia Patient was recently discharged on 09/16/2017 after hospitalization for a lumbar spine compression fracture, symptomatic anemia,, acute cystitis, and acute renal failure. Prior to ED presentation, patient was being treated for pneumonia and norovirus at the SNF with  Levofloxacin, Rocephin, Augmentin and acyclovir He is being admitted to the ICU for further management  PAST MEDICAL HISTORY :  He  has a past medical history of AICD (automatic cardioverter/defibrillator) present, Anemia, Blockage of coronary artery of heart (HCC), Cancer ([place]), CHF (congestive heart failure) ([place]), Chronic kidney disease (05/22/2017), Diabetes mellitus without complication ([place]), Dysrhythmia, Heart murmur, Hypertension, Hypochloremia, LBBB (left bundle branch block), Neuropathy, Presence of permanent cardiac pacemaker, Right renal mass, and 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  PAST SURGICAL HISTORY: He  has a past surgical history that includes Cardiac surgery; Heart Bypass; pacemaker placement; Endobronchial ultrasound (N/A, 04/03/2017); Coronary artery bypass graft; and IR KYPHO LUMBAR INC FX REDUCE BONE BX UNI/BIL CANNULATION INC/IMAGING (09/12/2017).  No Known Allergies  No current facility-administered medications on file prior to encounter.    Current Outpatient Medications on File Prior to Encounter  Medication Sig  . acetaminophen (TYLENOL) 325 MG tablet Take 2  tablets (650 mg total) by mouth every 6 (six) hours as needed for mild pain (or Fever >/= 101).  Marland Kitchen acyclovir (ZOVIRAX) 400 MG tablet Take 1 tablet (400 mg total) by mouth daily.  Marland Kitchen albuterol (PROVENTIL) (2.5 MG/3ML) 0.083% nebulizer solution Take 2.5 mg by nebulization every 4 (four) hours as needed for wheezing or shortness of breath.  Marland Kitchen alendronate (FOSAMAX) 70 MG tablet Take 1 tablet (70 mg total) by mouth once a week. Take with a full glass of water on an empty stomach.  Marland Kitchen atorvastatin (LIPITOR) 40 MG tablet Take 40 mg by mouth at bedtime.  . calcium-vitamin D (OSCAL WITH D) 500-200 MG-UNIT tablet Take 1 tablet by mouth 3 (three) times daily with meals. (Patient taking differently: Take 1 tablet by mouth 2 (two) times daily. )  . carvedilol (COREG) 25 MG tablet Take 25 mg by mouth 2 (two) times daily with a meal.  . diclofenac sodium (VOLTAREN) 1 % GEL Apply 4 g topically 4 (four) times daily.  Marland Kitchen docusate sodium (COLACE) 100 MG capsule Take 1 capsule (100 mg total) by mouth 2 (two) times daily.  . fentaNYL (DURAGESIC - DOSED MCG/HR) 12 MCG/HR Place 1 patch (12.5 mcg total) onto the skin every 3 (three) days.  . furosemide (LASIX) 40 MG tablet Take 20-40 mg by mouth daily. Pt takes 1 pill one day and alternates with 0.5 pill the next day ( this is alternating dose every other day)  . gabapentin (NEURONTIN) 100 MG capsule Take 100 mg by mouth daily as needed (pain).   . insulin NPH-regular Human (NOVOLIN 70/30) (70-30) 100 UNIT/ML injection 8u (AM) and 8u (PM) (Patient taking differently: Inject 8 Units into the skin 2 (two) times daily with a meal. )  . loperamide (IMODIUM A-D) 2  MG tablet Take 2 mg by mouth every 6 (six) hours as needed for diarrhea or loose stools.  . menthol-cetylpyridinium (CEPACOL) 3 MG lozenge Take 1 lozenge by mouth 4 (four) times daily -  before meals and at bedtime.  . ondansetron (ZOFRAN) 4 MG tablet Take 4 mg by mouth every 6 (six) hours as needed for nausea or vomiting.   Marland Kitchen oxyCODONE (OXY IR/ROXICODONE) 5 MG immediate release tablet Take 1 tablet (5 mg total) by mouth every 4 (four) hours as needed for moderate pain or severe pain.  . potassium chloride SA (K-DUR,KLOR-CON) 20 MEQ tablet Take 20 mEq by mouth 2 (two) times daily.  . tamsulosin (FLOMAX) 0.4 MG CAPS capsule Take 0.4 mg by mouth daily.  . traZODone (DESYREL) 50 MG tablet Take 0.5 tablets (25 mg total) by mouth at bedtime as needed for sleep. (Patient taking differently: Take 50 mg by mouth at bedtime. )  . vitamin B-12 (CYANOCOBALAMIN) 1000 MCG tablet Take 1,000 mcg by mouth daily.  . vitamin C (ASCORBIC ACID) 500 MG tablet Take 500 mg by mouth daily.  . [DISCONTINUED] prochlorperazine (COMPAZINE) 10 MG tablet Take 1 tablet (10 mg total) by mouth every 6 (six) hours as needed (Nausea or vomiting).    FAMILY HISTORY:  His is adopted.    SOCIAL HISTORY: He  reports that he quit smoking about 24 years ago. His smoking use included cigars. He quit after 10.00 years of use. he has never used smokeless tobacco. He reports that he does not drink alcohol or use drugs.  REVIEW OF SYSTEMS:   Unable to obtain as patient is on continuous BiPAP  SUBJECTIVE:  VITAL SIGNS: BP (!) 200/80   Pulse 80   Temp 98.6 F (37 C) (Axillary)   Resp (!) 27   Ht 6' (1.829 m)   Wt 193 lb 9 oz (87.8 kg)   SpO2 100%   BMI 26.25 kg/m   HEMODYNAMICS:    VENTILATOR SETTINGS: FiO2 (%):  [70 %-100 %] 70 %  INTAKE / OUTPUT: No intake/output data recorded.  PHYSICAL EXAMINATION: General:  Appears chronically ill Neuro:  Somnolent, awakens to voice, follows basic commands HEENT:  PERRLA, trachea midline, no jvd Cardiovascular:  RRR, S1/S2, no MRG, +2 pulses, +3 edema Lungs:  Increased work of breathing, bilateral breath sounds with diffuse rhonchi in bilateral lower lobes, no wheezing Abdomen: Non-distended, normal bowel sounds Musculoskeletal: muscle wasting, no joint swelling Skin: multiple skin ulcers in  lower extremities and sacrum in different stages  LABS:  BMET Recent Labs  Lab 10/07/17 0928 10/12/17 1640  NA 136 141  K 4.4 4.1  CL 106 113*  CO2 23 20*  BUN 36* 53*  CREATININE 2.86* 2.54*  GLUCOSE 187* 196*    Electrolytes Recent Labs  Lab 10/07/17 0928 10/12/17 1640  CALCIUM 7.0* 7.0*    CBC Recent Labs  Lab 10/07/17 0928 10/12/17 1640  WBC 6.4 1.7*  HGB 8.1* 8.0*  HCT 25.4* 24.9*  PLT 139* 71*    Coag's No results for input(s): APTT, INR in the last 168 hours.  Sepsis Markers No results for input(s): LATICACIDVEN, PROCALCITON, O2SATVEN in the last 168 hours.  ABG No results for input(s): PHART, PCO2ART, PO2ART in the last 168 hours.  Liver Enzymes Recent Labs  Lab 10/07/17 0928 10/12/17 1640  AST 38 65*  ALT 25 41  ALKPHOS 67 61  BILITOT 0.9 0.8  ALBUMIN 2.4* 1.9*    Cardiac Enzymes Recent Labs  Lab 10/12/17 1640  TROPONINI 1.22*    Glucose Recent Labs  Lab 10/12/17 2146  GLUCAP 213*    Imaging Dg Chest Portable 1 View  Result Date: 10/12/2017 CLINICAL DATA:  Shortness of breath. EXAM: PORTABLE CHEST 1 VIEW COMPARISON:  Chest x-ray dated October 07, 2017. FINDINGS: Unchanged left chest wall AICD. Stable cardiomegaly. Mild central vascular congestion. Unchanged retrocardiac opacity. No large pleural effusion. No pneumothorax. No acute osseous abnormality. IMPRESSION: 1. Stable cardiomegaly and mild central vascular congestion. No overt pulmonary edema. 2. Unchanged left lower lobe pneumonia. Electronically Signed   By: Titus Dubin M.D.   On: 10/12/2017 17:54    STUDIES:  none  CULTURES: STAT influenza swab  ANTIBIOTICS: Vancomycin Cefepime Tamiflu  SIGNIFICANT EVENTS: 2/17>re-admitted  LINES/TUBES: PIVs  DISCUSSION: This is a 72 y/o male presenting with HCAP with possible super imposed  influenza infection, immunocompromised secondary to multiple myeloma and chemotherapy, worsening kidney function, and acute  hypoxic respiratory failure secondary to pneumonia  ASSESSMENT / PLAN:  PULMONARY A: Acute hypoxic respiratory failure HCAP R/O Influenza pneumonia Mild pulmonary edema P:   Continuous BiPAP and titrate to nasal cannula as tolerated Abx as above STAT Influenza swab  Sputum culture Pulmonary hygiene Nebulized bronchodilators and steroids Chest PT IS when off BiPAP Droplet precautions  CARDIOVASCULAR A:  Congestive heart failure LBBB s/p pacemaker/AICD CAD Hypertension P:  Hemodynamics per icu protocol Continue all home medications Monitor Is/Os IV diuretics  RENAL A:   Acute on chronic renal failure-Creatinine trending up H/O Renal mass s/p right nephrectomy  P:   Trend creatinine Nephrology consult Monitor and correct electrolytes  HEMATOLOGIC A:   Multiple myeloma PANCYTOPENIC 2/2 Multiple myeloma P:  Oncology consult Neutropenic precautions  if necessary  INFECTIOUS A:   HCAP R/O Influenza infection Immunocompromised P:   ID consult Abx as above F/u cultures Droplet precuations  ENDOCRINE A:   T2DM  P:   Blood glucose monitoring with SSI coverage  NEUROLOGIC A:   Generalized debility Diabetic neuropathy P:   PT/OT as tolerated   FAMILY  - Updates: No family at bedside. Will update when available  - Inter-disciplinary family meet or Palliative Care meeting due by:  day [place]. [facility] ANP-BC Pulmonary and Critical Care Medicine [facility] Pager [phone] or [phone]  NB: This document was prepared using Dragon voice recognition software and may include unintentional dictation errors.    10/13/2017, 3:40 AM

## 2017-10-13 NOTE — Progress Notes (Signed)
Inpatient Diabetes Program Recommendations  AACE/ADA: New Consensus Statement on Inpatient Glycemic Control (2015)  Target Ranges:  Prepandial:   less than 140 mg/dL      Peak postprandial:   less than 180 mg/dL (1-2 hours)      Critically ill patients:  140 - 180 mg/dL   Results for SON, BARKAN (MRN [id number]) as of 10/13/2017 08:06  Ref. Range 10/12/2017 21:46 10/13/2017 07:13  Glucose-Capillary Latest Ref Range: 65 - 99 mg/dL 213 (H) 250 (H)     Admit: Pneumonia  History: DM, CHF, CKD, Multiple Myeloma (getting Chemo)  Home DM Meds: 70/30 Insulin- 8 units BID  Current Orders: Novolog Resistant Correction Scale/ SSI (0-20 units) TID AC + HS      MD- Please consider the following in-hospital insulin adjustments:  1. Change and Reduce Novolog SSI to Moderate scale (0-15 units) Q4 hours   (currently ordered as Novolog Resistant scale 0-20 units TID AC + HS and patient is NPO)  2. Start Lantus 10 units daily (approximately 0.1 units/kg dosing based on weight of 87kg).    Patient takes 70/30 Insulin at home but 70/30 not ideal to start given pt's NPO status.     --Will follow patient during hospitalization--  Wyn Quaker RN, MSN, CDE Diabetes Coordinator Inpatient Glycemic Control Team Team Pager: [phone] (8a-5p)

## 2017-10-13 NOTE — Progress Notes (Signed)
   10/13/17 0206  Vitals  BP (!) 200/80  MAP (mmHg) 106  Pulse Rate 80  ECG Heart Rate 80  Resp 18  Oxygen Therapy  SpO2 100 %   Notified NP Tukov, no new orders obtained at this time.

## 2017-10-13 NOTE — Progress Notes (Signed)
Patient ID: Adam Wall, male   DOB: Oct 22, 1945, 72 y.o.   MRN: [id number]  [facility] PROGRESS NOTE  Adam Wall [id number]:[id number] DOB: 31-Dec-1945 DOA: 10/12/2017 PCP: [name], MD  HPI/Subjective: Patient feeling a little bit better.  States he has had a very rocky course recently.  Objective: Vitals:   10/13/17 1100 10/13/17 1146  BP:    Pulse: (!) 106   Resp: 13   Temp:    SpO2: 98% 97%    Intake/Output Summary (Last 24 hours) at 10/13/2017 1632 Last data filed at 10/13/2017 0540 Gross per 24 hour  Intake 300 ml  Output -  Net 300 ml   Filed Weights   10/12/17 1704 10/12/17 2158 10/13/17 0134  Weight: 90.7 kg (200 lb) 87.8 kg (193 lb 9 oz) 87.8 kg (193 lb 9 oz)    ROS: Review of Systems  Constitutional: Negative for chills and fever.  Eyes: Negative for blurred vision.  Respiratory: Positive for cough and shortness of breath.   Cardiovascular: Negative for chest pain.  Gastrointestinal: Negative for abdominal pain, constipation, diarrhea, nausea and vomiting.  Genitourinary: Negative for dysuria.  Musculoskeletal: Positive for back pain. Negative for joint pain.  Neurological: Negative for dizziness and headaches.   Exam: Physical Exam  Constitutional: He is oriented to person, place, and time.  HENT:  Nose: No mucosal edema.  Mouth/Throat: No oropharyngeal exudate or posterior oropharyngeal edema.  Eyes: Conjunctivae, EOM and lids are normal. Pupils are equal, round, and reactive to light.  Neck: No JVD present. Carotid bruit is not present. No edema present. No thyroid mass and no thyromegaly present.  Cardiovascular: S1 normal and S2 normal. Exam reveals no gallop.  No murmur heard. Pulses:      Dorsalis pedis pulses are 2+ on the right side, and 2+ on the left side.  Respiratory: No respiratory distress. He has decreased breath sounds in the right lower field and the left lower field. He has no wheezes. He has no rhonchi. He has no  rales.  GI: Soft. Bowel sounds are normal. There is no tenderness.  Musculoskeletal:       Right ankle: He exhibits swelling.       Left ankle: He exhibits swelling.  Lymphadenopathy:    He has no cervical adenopathy.  Neurological: He is alert and oriented to person, place, and time. No cranial nerve deficit.  Skin: Skin is warm. Nails show no clubbing.  Bilateral heel decubiti with discoloration of the skin bilaterally.  Stage II decubitus ulcer on buttock  Psychiatric: He has a normal mood and affect.      Data Reviewed: Basic Metabolic Panel: Recent Labs  Lab 10/07/17 0928 10/12/17 1640 10/13/17 0459  NA 136 141 141  K 4.4 4.1 4.0  CL 106 113* 113*  CO2 23 20* 17*  GLUCOSE 187* 196* 262*  BUN 36* 53* 57*  CREATININE 2.86* 2.54* 2.52*  CALCIUM 7.0* 7.0* 6.9*  MG  --   --  1.6*   Liver Function Tests: Recent Labs  Lab 10/07/17 0928 10/12/17 1640  AST 38 65*  ALT 25 41  ALKPHOS 67 61  BILITOT 0.9 0.8  PROT 5.8* 5.6*  ALBUMIN 2.4* 1.9*   CBC: Recent Labs  Lab 10/07/17 0928 10/12/17 1640 10/13/17 0459  WBC 6.4 1.7* 2.4*  NEUTROABS 5.3 1.3*  --   HGB 8.1* 8.0* 7.8*  HCT 25.4* 24.9* 23.7*  MCV 72.6* 70.4* 69.7*  PLT 139* 71* 71*   Cardiac Enzymes:  Recent Labs  Lab 10/12/17 1640  TROPONINI 1.22*   BNP (last 3 results) Recent Labs    09/04/17 1325 09/10/17 2113 10/12/17 1640  BNP 245.0* 137.0* 534.0*     CBG: Recent Labs  Lab 10/12/17 2146 10/13/17 0713 10/13/17 1108  GLUCAP 213* 250* 239*    Recent Results (from the past 240 hour(s))  Culture, blood (routine x 2)     Status: None (Preliminary result)   Collection Time: 10/12/17  5:05 PM  Result Value Ref Range Status   Specimen Description BLOOD RIGHT ANTECUBITAL  Final   Special Requests   Final    BOTTLES DRAWN AEROBIC AND ANAEROBIC Blood Culture results may not be optimal due to an excessive volume of blood received in culture bottles   Culture  Setup Time   Final    Organism ID  to follow [place] AND ANAEROBIC BOTTLES CRITICAL RESULT CALLED TO, READ BACK BY AND VERIFIED WITH: JASON ROBBINS AT 1514 ON 10/13/2017 JJB Performed at [facility], [address] [place], [place] [address]    Culture GRAM POSITIVE COCCI  Final   Report Status PENDING  Incomplete  Culture, blood (routine x 2)     Status: None (Preliminary result)   Collection Time: 10/12/17  5:05 PM  Result Value Ref Range Status   Specimen Description BLOOD RIGHT HAND  Final   Special Requests   Final    BOTTLES DRAWN AEROBIC AND ANAEROBIC Blood Culture adequate volume   Culture  Setup Time   Final    GRAM POSITIVE COCCI IN BOTH AEROBIC AND ANAEROBIC BOTTLES CRITICAL RESULT CALLED TO, READ BACK BY AND VERIFIED WITH: JASON ROBBINS ATR [id number] ON 10/13/2017 JJB Performed at [facility], [address] [place], [place] [address]    Culture GRAM POSITIVE COCCI  Final   Report Status PENDING  Incomplete  Blood Culture ID Panel (Reflexed)     Status: Abnormal   Collection Time: 10/12/17  5:05 PM  Result Value Ref Range Status   Enterococcus species NOT DETECTED NOT DETECTED Final   Listeria monocytogenes NOT DETECTED NOT DETECTED Final   Staphylococcus species DETECTED (A) NOT DETECTED Final    Comment: CRITICAL RESULT CALLED TO, READ BACK BY AND VERIFIED WITH: JASON ROBBINS AT 1514 ON 10/13/2017 JJB    Staphylococcus aureus DETECTED (A) NOT DETECTED Final    Comment: Methicillin (oxacillin)-resistant Staphylococcus aureus (MRSA). MRSA is predictably resistant to beta-lactam antibiotics (except ceftaroline). Preferred therapy is vancomycin unless clinically contraindicated. Patient requires contact precautions if  hospitalized. CRITICAL RESULT CALLED TO, READ BACK BY AND VERIFIED WITH: JASON ROBBINS AT 8466 ON 10/13/2017 JJB    Methicillin resistance DETECTED (A) NOT DETECTED Final    Comment: CRITICAL RESULT CALLED TO, READ BACK BY AND VERIFIED  WITH: JASON ROBBINS AT 5993 ON 10/13/2017 JJB    Streptococcus species NOT DETECTED NOT DETECTED Final   Streptococcus agalactiae NOT DETECTED NOT DETECTED Final   Streptococcus pneumoniae NOT DETECTED NOT DETECTED Final   Streptococcus pyogenes NOT DETECTED NOT DETECTED Final   Acinetobacter baumannii NOT DETECTED NOT DETECTED Final   Enterobacteriaceae species NOT DETECTED NOT DETECTED Final   Enterobacter cloacae complex NOT DETECTED NOT DETECTED Final   Escherichia coli NOT DETECTED NOT DETECTED Final   Klebsiella oxytoca NOT DETECTED NOT DETECTED Final   Klebsiella pneumoniae NOT DETECTED NOT DETECTED Final   Proteus species NOT DETECTED NOT DETECTED Final   Serratia marcescens NOT DETECTED NOT DETECTED Final  Haemophilus influenzae NOT DETECTED NOT DETECTED Final   Neisseria meningitidis NOT DETECTED NOT DETECTED Final   Pseudomonas aeruginosa NOT DETECTED NOT DETECTED Final   Candida albicans NOT DETECTED NOT DETECTED Final   Candida glabrata NOT DETECTED NOT DETECTED Final   Candida krusei NOT DETECTED NOT DETECTED Final   Candida parapsilosis NOT DETECTED NOT DETECTED Final   Candida tropicalis NOT DETECTED NOT DETECTED Final    Comment: Performed at [facility], [address] [place], [place] [address]  MRSA PCR Screening     Status: Abnormal   Collection Time: 10/12/17 11:04 PM  Result Value Ref Range Status   MRSA by PCR POSITIVE (A) NEGATIVE Final    Comment:        The GeneXpert MRSA Assay (FDA approved for NASAL specimens only), is one component of a comprehensive MRSA colonization surveillance program. It is not intended to diagnose MRSA infection nor to guide or monitor treatment for MRSA infections. RESULT CALLED TO, READ BACK BY AND VERIFIED WITH: TONI WALKER AT 0052 10/13/17.PMH Performed at [facility], [address], [place], [place] [address]      Studies: Dg Chest Portable 1 View  Result Date: 10/12/2017 CLINICAL DATA:   Shortness of breath. EXAM: PORTABLE CHEST 1 VIEW COMPARISON:  Chest x-ray dated October 07, 2017. FINDINGS: Unchanged left chest wall AICD. Stable cardiomegaly. Mild central vascular congestion. Unchanged retrocardiac opacity. No large pleural effusion. No pneumothorax. No acute osseous abnormality. IMPRESSION: 1. Stable cardiomegaly and mild central vascular congestion. No overt pulmonary edema. 2. Unchanged left lower lobe pneumonia. Electronically Signed   By: Titus Dubin M.D.   On: 10/12/2017 17:54    Scheduled Meds: . acyclovir  400 mg Oral Daily  . atorvastatin  40 mg Oral QHS  . budesonide (PULMICORT) nebulizer solution  0.25 mg Nebulization Q6H  . calcium-vitamin D  1 tablet Oral BID  . carvedilol  25 mg Oral BID WC  . docusate sodium  100 mg Oral BID  . fentaNYL  12.5 mcg Transdermal Q72H  . [START ON 10/14/2017] furosemide  80 mg Intravenous Daily  . heparin  5,000 Units Subcutaneous Q8H  . insulin aspart  0-20 Units Subcutaneous TID WC  . insulin aspart  0-5 Units Subcutaneous QHS  . ipratropium-albuterol  3 mL Nebulization Q6H  . mupirocin ointment   Topical Daily  . oseltamivir  30 mg Oral Daily  . potassium chloride SA  20 mEq Oral BID  . tamsulosin  0.4 mg Oral Daily  . traZODone  50 mg Oral QHS  . vitamin B-12  1,000 mcg Oral Daily  . vitamin C  500 mg Oral Daily   Continuous Infusions: . sodium chloride    . magnesium sulfate 1 - 4 g bolus IVPB    . [START ON 10/14/2017] vancomycin      Assessment/Plan:  1. Clinical sepsis, healthcare acquired pneumonia.  Positive blood cultures with staph aureus with methicillin resistance.  Await final sensitivities.  Infectious disease consultation.  Echocardiogram ordered.   Patient on IV vancomycin. 2. Acute hypoxic respiratory failure.  Patient off BiPAP and now on nasal cannula. 3. Influenza A positive on oseltamivir 4. Chronic multiple myeloma with pancytopenia.  Last chemotherapy February 4th. 5. Chronic heel and  sacral decubiti.  Local wound care 6. Hypomagnesemia replaced 7. Recent lumbar spine compression fracture with interventional radiology kyphoplasty on last hospitalization 8. Acute kidney injury on chronic kidney disease stage III 9. Anemia of chronic disease 10. Atrial fibrillation  with rapid ventricular response on Coreg 11. BPH on Flomax 12. Type 2 diabetes mellitus on sliding scale  Code Status:     Code Status Orders  (From admission, onward)        Start     Ordered   10/12/17 2157  Full code  Continuous     10/12/17 2156    Code Status History    Date Active Date Inactive Code Status Order ID Comments User Context   09/11/2017 03:08 09/16/2017 20:28 Full Code [id number]  Amelia Jo, MD Inpatient   09/04/2017 17:55 09/08/2017 18:54 Full Code [id number]  [name], MD ED     Family Communication: As per critical care specialist Disposition Plan: To be determined  Consultants:  Critical care specialist  Infectious disease  Antibiotics:  Vancomycin  Time spent: 25 minutes  [place]

## 2017-10-13 NOTE — Consult Note (Signed)
Pharmacy Antibiotic Note  Adam Wall is a 72 y.o. male admitted on 10/12/2017 with bacteremia.  Pharmacy has been consulted for Vancomycin dosing.   Patient was originally ordered vancomycin 1g IV every 24 hours with 6 hour stack dosing. Patient received vancomycin stack dose and 1 additional dose before vancomycin was discontinued.   2/17 BCID: MRSA  Plan: Ke: 0.028   Vd: 63.5    T1/2: 24h  Restart Vancomycin 1g IV every 24 hours. Trough level ordered prior to 4th dose. Will monitor patients renal function and adjust dose as needed.   Height: 6' (182.9 cm) Weight: 193 lb 9 oz (87.8 kg) IBW/kg (Calculated) : 77.6  Temp (24hrs), Avg:98.6 F (37 C), Min:98.4 F (36.9 C), Max:98.7 F (37.1 C)  Recent Labs  Lab 10/07/17 0928 10/12/17 1640 10/13/17 0459  WBC 6.4 1.7* 2.4*  CREATININE 2.86* 2.54* 2.52*    Estimated Creatinine Clearance: 28.7 mL/min (A) (by C-G formula based on SCr of 2.52 mg/dL (H)).    No Known Allergies  Antimicrobials this admission: 2/17 cefepime >> 2/18 2/17 vancomycin>  2/18 Influenza A (+)   Dose adjustments this admission:  Microbiology results: 2/18  BCx: MRSA 2/17  MRSA PCR: Positive   Thank you for allowing pharmacy to be a part of this patient's care.  Pernell Dupre, PharmD, BCPS Clinical Pharmacist 10/13/2017 3:37 PM

## 2017-10-13 NOTE — Progress Notes (Addendum)
Comfortable off BiPAP on 3 LPM [place] Mildly lethargic, no distress Influenza A positive 2/2 BC positive for MRSA   Vitals:   10/13/17 0900 10/13/17 1000 10/13/17 1100 10/13/17 1146  BP: (!) 167/46 (!) 155/123    Pulse: 69 (!) 117 (!) 106   Resp: 18 14 13    Temp:      TempSrc:      SpO2: 100% 100% 98% 97%  Weight:      Height:      3 LPM [place]  RASS -1 NAD HEENT WNL No JVD Bibasilar crackles, no wheezes Regular, systolic murmur noted Diminished bowel sounds, soft, NT Symmetric LE edema No focal neurologic deficits  BMP Latest Ref Rng & Units 10/13/2017 10/12/2017 10/07/2017  Glucose 65 - 99 mg/dL 262(H) 196(H) 187(H)  BUN 6 - 20 mg/dL 57(H) 53(H) 36(H)  Creatinine 0.61 - 1.24 mg/dL 2.52(H) 2.54(H) 2.86(H)  Sodium 135 - 145 mmol/L 141 141 136  Potassium 3.5 - 5.1 mmol/L 4.0 4.1 4.4  Chloride 101 - 111 mmol/L 113(H) 113(H) 106  CO2 22 - 32 mmol/L 17(L) 20(L) 23  Calcium 8.9 - 10.3 mg/dL 6.9(L) 7.0(L) 7.0(L)    CBC Latest Ref Rng & Units 10/13/2017 10/12/2017 10/07/2017  WBC 3.8 - 10.6 K/uL 2.4(L) 1.7(L) 6.4  Hemoglobin 13.0 - 18.0 g/dL 7.8(L) 8.0(L) 8.1(L)  Hematocrit 40.0 - 52.0 % 23.7(L) 24.9(L) 25.4(L)  Platelets 150 - 440 K/uL 71(L) 71(L) 139(L)    CXR 02/17: Bibasilar opacities  IMPRESSION: SNF resident with complex medical history inc MM, DM2 Recently treated for PNA (completed 5 days Levofloxacin) Influenza A pneumonitis with bibasilar opacites MRSA bacteremia Acute respiratory failure with hypoxemia - improving AKI/CKD H/O MM with pancytopenia DM 2 -inadequately controlled  PLAN/REC: Continue supplemental oxygen as needed to maintain SPO2 >90% Continue nebulized bronchodilators Add nebulized steroids Continue cardiac monitoring Monitor BMET intermittently Monitor I/Os Correct electrolytes as indicated  Continue oseltamivir Continue vancomycin ID consultation Discontinue cefepime Continue SSI protocol Oncology consultation has been requested to evaluate  pancytopenia I have requested palliative care consultation to address goals of care  He is a poor candidate for intubation, ACLS, hemodialysis Transfer to MedSurg floor ordered  Merton Border, MD PCCM service Mobile [phone] Pager [phone] 10/13/2017 1:51 PM

## 2017-10-13 NOTE — Consult Note (Addendum)
[facility] Infectious Disease     Reason for Consult:MRSA bacteremia    Referring Physician: Alva Garnet, D Date of Admission:  10/12/2017   Active Problems:   Acute respiratory failure with hypoxemia (HCC)   HPI: Adam Wall is a 72 y.o. male with multiple myeloma who is admitted from a facility after recent discharge now with SOB and cough. He is on revlimid dexamethasone and velcade for his MM.  He was admitted 1/16-1/22 with L spine fracture and anemia as well as enterococcus UTI.  He had also developed bil heel early pressure injury. He has also developed a buttock wound at the facility per his report. He is having a hard time coherently expressing his recent course but his wife is at bedside. Of note on 2/12 in onc clinic had low grade fever and was given levofloxacin.  On this admission wbc was 1.7. CXR showed LLL pna, CM and vascular congestion. BCX + MRSA. Flu PCR + as well.   Past Medical History:  Diagnosis Date  . AICD (automatic cardioverter/defibrillator) present   . Anemia   . Blockage of coronary artery of heart ([place])   . Cancer ([place])   . CHF (congestive heart failure) ([place])   . Chronic kidney disease 05/22/2017   nephrectomy-right  . Diabetes mellitus without complication ([place])   . Dysrhythmia   . Heart murmur   . Hypertension   . Hypochloremia   . LBBB (left bundle branch block)   . Neuropathy   . Presence of permanent cardiac pacemaker   . Right renal mass   . 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 CARDIAC SURGERY    . CORONARY ARTERY BYPASS GRAFT    . ENDOBRONCHIAL ULTRASOUND N/A 04/03/2017   Procedure: ENDOBRONCHIAL ULTRASOUND;  Surgeon: Flora Lipps, MD;  Location: [facility] ORS;  Service: Cardiopulmonary;  Laterality: N/A;  . Heart Bypass    . IR KYPHO LUMBAR INC FX REDUCE BONE BX UNI/BIL CANNULATION INC/IMAGING  09/12/2017  . PACEMAKER PLACEMENT     ICD   Social History   Tobacco Use  . Smoking status:  Former Smoker    Years: 10.00    Types: Cigars    Last attempt to quit: 10/04/1993    Years since quitting: 24.0  . Smokeless tobacco: Never Used  . Tobacco comment: 3 CIGARS PER DAY  Substance Use Topics  . Alcohol use: No  . Drug use: No   Family History  Adopted: Yes  Problem Relation Age of Onset  . Heart failure Paternal Grandmother     Allergies: No Known Allergies  Current antibiotics: Antibiotics Given (last 72 hours)    Date/Time Action Medication Dose Rate   10/12/17 1856 New Bag/Given   ceFEPIme (MAXIPIME) 2 g in sodium chloride 0.9 % 100 mL IVPB 2 g 200 mL/hr   10/12/17 1942 New Bag/Given   vancomycin (VANCOCIN) IVPB 1000 mg/200 mL premix 1,000 mg 200 mL/hr   10/13/17 0130 New Bag/Given   vancomycin (VANCOCIN) IVPB 1000 mg/200 mL premix 1,000 mg 200 mL/hr   10/13/17 0540 New Bag/Given   ceFEPIme (MAXIPIME) 1 g in sodium chloride 0.9 % 100 mL IVPB 1 g 200 mL/hr   10/13/17 0942 Given   acyclovir (ZOVIRAX) 200 MG capsule 400 mg 400 mg    10/13/17 7209 Given   oseltamivir (TAMIFLU) capsule 30 mg 30 mg       MEDICATIONS: . acyclovir  400 mg Oral Daily  . atorvastatin  40 mg Oral QHS  . budesonide (PULMICORT) nebulizer solution  0.25 mg Nebulization Q6H  . calcium-vitamin D  1 tablet Oral BID  . carvedilol  25 mg Oral BID WC  . docusate sodium  100 mg Oral BID  . fentaNYL  12.5 mcg Transdermal Q72H  . [START ON 10/14/2017] furosemide  80 mg Intravenous Daily  . heparin  5,000 Units Subcutaneous Q8H  . insulin aspart  0-20 Units Subcutaneous TID WC  . insulin aspart  0-5 Units Subcutaneous QHS  . ipratropium-albuterol  3 mL Nebulization Q6H  . mupirocin ointment   Topical Daily  . oseltamivir  30 mg Oral Daily  . potassium chloride SA  20 mEq Oral BID  . tamsulosin  0.4 mg Oral Daily  . traZODone  50 mg Oral QHS  . vitamin B-12  1,000 mcg Oral Daily  . vitamin C  500 mg Oral Daily    Review of Systems - 11 systems reviewed and negative per  HPI   OBJECTIVE: Temp:  [98.4 F (36.9 C)-98.7 F (37.1 C)] 98.4 F (36.9 C) (02/18 0800) Pulse Rate:  [69-117] 106 (02/18 1100) Resp:  [13-41] 13 (02/18 1100) BP: (112-205)/(40-166) 155/123 (02/18 1000) SpO2:  [94 %-100 %] 97 % (02/18 1146) FiO2 (%):  [70 %-100 %] 70 % (02/17 2200) Weight:  [87.8 kg (193 lb 9 oz)-90.7 kg (200 lb)] 87.8 kg (193 lb 9 oz) (02/18 0134) Physical Exam  Constitutional: He is chronically ill appearing, somewhat confused HENT: anicteric, pale Mouth/Throat: Oropharynx is clear and dry . No oropharyngeal exudate.  Cardiovascular: Normal rate, regular rhythm and normal heart sounds. 2/6 sm AICD in place, non tender Pulmonary/Chest: poor air movement Abdominal: Soft. Bowel sounds are normal. He exhibits no distension. There is no tenderness.  Lymphadenopathy: He has no cervical adenopathy.  Neurological: He is awake and interactive Ext no cce Skin: bil heels with pressure injury and bruising but no open wounds or drainage Psychiatric: flat affect  LABS: Results for orders placed or performed during the hospital encounter of 10/12/17 (from the past 48 hour(s))  CBC with Differential     Status: Abnormal   Collection Time: 10/12/17  4:40 PM  Result Value Ref Range   WBC 1.7 (L) 3.8 - 10.6 K/uL   RBC 3.54 (L) 4.40 - 5.90 MIL/uL   Hemoglobin 8.0 (L) 13.0 - 18.0 g/dL   HCT 24.9 (L) 40.0 - 52.0 %   MCV 70.4 (L) 80.0 - 100.0 fL   MCH 22.7 (L) 26.0 - 34.0 pg   MCHC 32.2 32.0 - 36.0 g/dL   RDW 19.9 (H) 11.5 - 14.5 %   Platelets 71 (L) 150 - 440 K/uL   Neutrophils Relative % 75 %   Lymphocytes Relative 12 %   Monocytes Relative 12 %   Eosinophils Relative 0 %   Basophils Relative 1 %   Neutro Abs 1.3 (L) 1.4 - 6.5 K/uL   Lymphs Abs 0.2 (L) 1.0 - 3.6 K/uL   Monocytes Absolute 0.2 0.2 - 1.0 K/uL   Eosinophils Absolute 0.0 0 - 0.7 K/uL   Basophils Absolute 0.0 0 - 0.1 K/uL   RBC Morphology MIXED RBC POPULATION     Comment: Performed at [facility], [address] [place], [place] [address]  Troponin I     Status: Abnormal   Collection Time: 10/12/17  4:40 PM  Result Value Ref Range   Troponin I 1.22 (HH) <0.03 ng/mL    Comment: CRITICAL RESULT CALLED  TO, READ BACK BY AND VERIFIED WITH ASHLEY SMITH ON 10/12/17 AT 1741 [facility] Performed at [facility], [address] [place], [place] [address]   Brain natriuretic peptide     Status: Abnormal   Collection Time: 10/12/17  4:40 PM  Result Value Ref Range   B Natriuretic Peptide 534.0 (H) 0.0 - 100.0 pg/mL    Comment: Performed at [facility], [address] [place], [place] [address]  Comprehensive metabolic panel     Status: Abnormal   Collection Time: 10/12/17  4:40 PM  Result Value Ref Range   Sodium 141 135 - 145 mmol/L   Potassium 4.1 3.5 - 5.1 mmol/L   Chloride 113 (H) 101 - 111 mmol/L   CO2 20 (L) 22 - 32 mmol/L   Glucose, Bld 196 (H) 65 - 99 mg/dL   BUN 53 (H) 6 - 20 mg/dL   Creatinine, Ser 2.54 (H) 0.61 - 1.24 mg/dL   Calcium 7.0 (L) 8.9 - 10.3 mg/dL   Total Protein 5.6 (L) 6.5 - 8.1 g/dL   Albumin 1.9 (L) 3.5 - 5.0 g/dL   AST 65 (H) 15 - 41 U/L   ALT 41 17 - 63 U/L   Alkaline Phosphatase 61 38 - 126 U/L   Total Bilirubin 0.8 0.3 - 1.2 mg/dL   GFR calc non Af Amer 24 (L) >60 mL/min   GFR calc Af Amer 27 (L) >60 mL/min    Comment: (NOTE) The eGFR has been calculated using the CKD EPI equation. This calculation has not been validated in all clinical situations. eGFR's persistently <60 mL/min signify possible Chronic Kidney Disease.    Anion gap 8 5 - 15    Comment: Performed at [facility], [address] [place], [place] [address]  Blood gas, venous     Status: Abnormal   Collection Time: 10/12/17  5:00 PM  Result Value Ref Range   pH, Ven 7.43 7.250 - 7.430   pCO2, Ven 31 (L) 44.0 - 60.0 mmHg   pO2, Ven 36.0 32.0 - 45.0 mmHg   Bicarbonate 20.6 20.0 - 28.0 mmol/L   Acid-base deficit 3.2 (H) 0.0 - 2.0 mmol/L   O2  Saturation 71.0 %   Patient temperature 37.0    Collection site VEIN    Sample type VEIN     Comment: Performed at [facility], [address] [place], [place] [address]  Culture, blood (routine x 2)     Status: None (Preliminary result)   Collection Time: 10/12/17  5:05 PM  Result Value Ref Range   Specimen Description BLOOD RIGHT ANTECUBITAL    Special Requests      BOTTLES DRAWN AEROBIC AND ANAEROBIC Blood Culture results may not be optimal due to an excessive volume of blood received in culture bottles   Culture  Setup Time      Organism ID to follow [place] CALLED TO, READ BACK BY AND VERIFIED WITH: JASON ROBBINS AT 1514 ON 10/13/2017 JJB Performed at [facility], [address] [place], [place] [address]    Culture GRAM POSITIVE COCCI    Report Status PENDING   Culture, blood (routine x 2)     Status: None (Preliminary result)   Collection Time: 10/12/17  5:05 PM  Result Value Ref Range   Specimen Description BLOOD RIGHT HAND    Special Requests      BOTTLES DRAWN AEROBIC AND ANAEROBIC Blood Culture  adequate volume   Culture  Setup Time      GRAM POSITIVE COCCI IN BOTH AEROBIC AND ANAEROBIC BOTTLES CRITICAL RESULT CALLED TO, READ BACK BY AND VERIFIED WITH: JASON ROBBINS ATR 7096 ON 10/13/2017 JJB Performed at [facility], [address] [place], [place] [address]    Culture GRAM POSITIVE COCCI    Report Status PENDING   Blood Culture ID Panel (Reflexed)     Status: Abnormal   Collection Time: 10/12/17  5:05 PM  Result Value Ref Range   Enterococcus species NOT DETECTED NOT DETECTED   Listeria monocytogenes NOT DETECTED NOT DETECTED   Staphylococcus species DETECTED (A) NOT DETECTED    Comment: CRITICAL RESULT CALLED TO, READ BACK BY AND VERIFIED WITH: JASON ROBBINS AT 1514 ON 10/13/2017 JJB    Staphylococcus aureus DETECTED (A) NOT DETECTED    Comment: Methicillin  (oxacillin)-resistant Staphylococcus aureus (MRSA). MRSA is predictably resistant to beta-lactam antibiotics (except ceftaroline). Preferred therapy is vancomycin unless clinically contraindicated. Patient requires contact precautions if  hospitalized. CRITICAL RESULT CALLED TO, READ BACK BY AND VERIFIED WITH: JASON ROBBINS AT 2947 ON 10/13/2017 JJB    Methicillin resistance DETECTED (A) NOT DETECTED    Comment: CRITICAL RESULT CALLED TO, READ BACK BY AND VERIFIED WITH: JASON ROBBINS AT 6546 ON 10/13/2017 JJB    Streptococcus species NOT DETECTED NOT DETECTED   Streptococcus agalactiae NOT DETECTED NOT DETECTED   Streptococcus pneumoniae NOT DETECTED NOT DETECTED   Streptococcus pyogenes NOT DETECTED NOT DETECTED   Acinetobacter baumannii NOT DETECTED NOT DETECTED   Enterobacteriaceae species NOT DETECTED NOT DETECTED   Enterobacter cloacae complex NOT DETECTED NOT DETECTED   Escherichia coli NOT DETECTED NOT DETECTED   Klebsiella oxytoca NOT DETECTED NOT DETECTED   Klebsiella pneumoniae NOT DETECTED NOT DETECTED   Proteus species NOT DETECTED NOT DETECTED   Serratia marcescens NOT DETECTED NOT DETECTED   Haemophilus influenzae NOT DETECTED NOT DETECTED   Neisseria meningitidis NOT DETECTED NOT DETECTED   Pseudomonas aeruginosa NOT DETECTED NOT DETECTED   Candida albicans NOT DETECTED NOT DETECTED   Candida glabrata NOT DETECTED NOT DETECTED   Candida krusei NOT DETECTED NOT DETECTED   Candida parapsilosis NOT DETECTED NOT DETECTED   Candida tropicalis NOT DETECTED NOT DETECTED    Comment: Performed at [facility], [address] [place], [place] [address]  Glucose, capillary     Status: Abnormal   Collection Time: 10/12/17  9:46 PM  Result Value Ref Range   Glucose-Capillary 213 (H) 65 - 99 mg/dL  MRSA PCR Screening     Status: Abnormal   Collection Time: 10/12/17 11:04 PM  Result Value Ref Range   MRSA by PCR POSITIVE (A) NEGATIVE    Comment:        The GeneXpert  MRSA Assay (FDA approved for NASAL specimens only), is one component of a comprehensive MRSA colonization surveillance program. It is not intended to diagnose MRSA infection nor to guide or monitor treatment for MRSA infections. RESULT CALLED TO, READ BACK BY AND VERIFIED WITH: TONI WALKER AT 0052 10/13/17.PMH Performed at [facility], [address] [place], [place] [address]   CBC     Status: Abnormal   Collection Time: 10/13/17  4:59 AM  Result Value Ref Range   WBC 2.4 (L) 3.8 - 10.6 K/uL   RBC 3.40 (L) 4.40 - 5.90 MIL/uL   Hemoglobin 7.8 (L) 13.0 - 18.0 g/dL   HCT 23.7 (L) 40.0 - 52.0 %   MCV 69.7 (  L) 80.0 - 100.0 fL   MCH 23.0 (L) 26.0 - 34.0 pg   MCHC 33.1 32.0 - 36.0 g/dL   RDW 20.3 (H) 11.5 - 14.5 %   Platelets 71 (L) 150 - 440 K/uL    Comment: Performed at [facility], [address] [place], [place] [address]  Basic metabolic panel     Status: Abnormal   Collection Time: 10/13/17  4:59 AM  Result Value Ref Range   Sodium 141 135 - 145 mmol/L   Potassium 4.0 3.5 - 5.1 mmol/L   Chloride 113 (H) 101 - 111 mmol/L   CO2 17 (L) 22 - 32 mmol/L   Glucose, Bld 262 (H) 65 - 99 mg/dL   BUN 57 (H) 6 - 20 mg/dL   Creatinine, Ser 2.52 (H) 0.61 - 1.24 mg/dL   Calcium 6.9 (L) 8.9 - 10.3 mg/dL   GFR calc non Af Amer 24 (L) >60 mL/min   GFR calc Af Amer 28 (L) >60 mL/min    Comment: (NOTE) The eGFR has been calculated using the CKD EPI equation. This calculation has not been validated in all clinical situations. eGFR's persistently <60 mL/min signify possible Chronic Kidney Disease.    Anion gap 11 5 - 15    Comment: Performed at [facility], [address] [place], [place] [address]  Magnesium     Status: Abnormal   Collection Time: 10/13/17  4:59 AM  Result Value Ref Range   Magnesium 1.6 (L) 1.7 - 2.4 mg/dL    Comment: Performed at [facility], [address] [place], [place] [address]  Influenza panel by PCR (type A & B)      Status: Abnormal   Collection Time: 10/13/17  4:59 AM  Result Value Ref Range   Influenza A By PCR POSITIVE (A) NEGATIVE   Influenza B By PCR NEGATIVE NEGATIVE    Comment: (NOTE) The Xpert Xpress Flu assay is intended as an aid in the diagnosis of  influenza and should not be used as a sole basis for treatment.  This  assay is FDA approved for nasopharyngeal swab specimens only. Nasal  washings and aspirates are unacceptable for Xpert Xpress Flu testing. Performed at [facility], [address] [place], [place] [address]   Glucose, capillary     Status: Abnormal   Collection Time: 10/13/17  7:13 AM  Result Value Ref Range   Glucose-Capillary 250 (H) 65 - 99 mg/dL  Glucose, capillary     Status: Abnormal   Collection Time: 10/13/17 11:08 AM  Result Value Ref Range   Glucose-Capillary 239 (H) 65 - 99 mg/dL   No components found for: ESR, C REACTIVE PROTEIN MICRO: Recent Results (from the past 720 hour(s))  Culture, blood (routine x 2)     Status: None (Preliminary result)   Collection Time: 10/12/17  5:05 PM  Result Value Ref Range Status   Specimen Description BLOOD RIGHT ANTECUBITAL  Final   Special Requests   Final    BOTTLES DRAWN AEROBIC AND ANAEROBIC Blood Culture results may not be optimal due to an excessive volume of blood received in culture bottles   Culture  Setup Time   Final    Organism ID to follow [place] AND ANAEROBIC BOTTLES CRITICAL RESULT CALLED TO, READ BACK BY AND VERIFIED WITH: JASON ROBBINS AT 2992 ON 10/13/2017 JJB Performed at [facility], [address] [place],  [address]    Culture Lonell Grandchild  POSITIVE COCCI  Final   Report Status PENDING  Incomplete  Culture, blood (routine x 2)     Status: None (Preliminary result)   Collection Time: 10/12/17  5:05 PM  Result Value Ref Range Status   Specimen Description BLOOD RIGHT HAND  Final   Special Requests   Final    BOTTLES DRAWN AEROBIC AND  ANAEROBIC Blood Culture adequate volume   Culture  Setup Time   Final    GRAM POSITIVE COCCI IN BOTH AEROBIC AND ANAEROBIC BOTTLES CRITICAL RESULT CALLED TO, READ BACK BY AND VERIFIED WITH: JASON ROBBINS ATR 2536 ON 10/13/2017 JJB Performed at [facility], [address] [place], [place] [address]    Culture GRAM POSITIVE COCCI  Final   Report Status PENDING  Incomplete  Blood Culture ID Panel (Reflexed)     Status: Abnormal   Collection Time: 10/12/17  5:05 PM  Result Value Ref Range Status   Enterococcus species NOT DETECTED NOT DETECTED Final   Listeria monocytogenes NOT DETECTED NOT DETECTED Final   Staphylococcus species DETECTED (A) NOT DETECTED Final    Comment: CRITICAL RESULT CALLED TO, READ BACK BY AND VERIFIED WITH: JASON ROBBINS AT 4742 ON 10/13/2017 JJB    Staphylococcus aureus DETECTED (A) NOT DETECTED Final    Comment: Methicillin (oxacillin)-resistant Staphylococcus aureus (MRSA). MRSA is predictably resistant to beta-lactam antibiotics (except ceftaroline). Preferred therapy is vancomycin unless clinically contraindicated. Patient requires contact precautions if  hospitalized. CRITICAL RESULT CALLED TO, READ BACK BY AND VERIFIED WITH: JASON ROBBINS AT 5956 ON 10/13/2017 JJB    Methicillin resistance DETECTED (A) NOT DETECTED Final    Comment: CRITICAL RESULT CALLED TO, READ BACK BY AND VERIFIED WITH: JASON ROBBINS AT 3875 ON 10/13/2017 JJB    Streptococcus species NOT DETECTED NOT DETECTED Final   Streptococcus agalactiae NOT DETECTED NOT DETECTED Final   Streptococcus pneumoniae NOT DETECTED NOT DETECTED Final   Streptococcus pyogenes NOT DETECTED NOT DETECTED Final   Acinetobacter baumannii NOT DETECTED NOT DETECTED Final   Enterobacteriaceae species NOT DETECTED NOT DETECTED Final   Enterobacter cloacae complex NOT DETECTED NOT DETECTED Final   Escherichia coli NOT DETECTED NOT DETECTED Final   Klebsiella oxytoca NOT DETECTED NOT DETECTED Final    Klebsiella pneumoniae NOT DETECTED NOT DETECTED Final   Proteus species NOT DETECTED NOT DETECTED Final   Serratia marcescens NOT DETECTED NOT DETECTED Final   Haemophilus influenzae NOT DETECTED NOT DETECTED Final   Neisseria meningitidis NOT DETECTED NOT DETECTED Final   Pseudomonas aeruginosa NOT DETECTED NOT DETECTED Final   Candida albicans NOT DETECTED NOT DETECTED Final   Candida glabrata NOT DETECTED NOT DETECTED Final   Candida krusei NOT DETECTED NOT DETECTED Final   Candida parapsilosis NOT DETECTED NOT DETECTED Final   Candida tropicalis NOT DETECTED NOT DETECTED Final    Comment: Performed at [facility], [address] [place], [place] [address]  MRSA PCR Screening     Status: Abnormal   Collection Time: 10/12/17 11:04 PM  Result Value Ref Range Status   MRSA by PCR POSITIVE (A) NEGATIVE Final    Comment:        The GeneXpert MRSA Assay (FDA approved for NASAL specimens only), is one component of a comprehensive MRSA colonization surveillance program. It is not intended to diagnose MRSA infection nor to guide or monitor treatment for MRSA infections. RESULT CALLED TO, READ BACK BY AND VERIFIED WITH: TONI WALKER AT 0052 10/13/17.PMH Performed at [facility], [address],  [place], [place] [address]     IMAGING: Dg Chest 2 View  Result Date: 10/07/2017 CLINICAL DATA:  Fever, productive cough, pleuritic chest pain, fever. History of multiple myeloma, diabetes, coronary artery disease and previous MI, CHF, former smoker. EXAM: CHEST  2 VIEW COMPARISON:  Portable chest x-ray of July 11, 2018 FINDINGS: The lungs are well-expanded. The interstitial markings are patchy and coarse. There is an infiltrate in the left lower lobe posteriorly. The cardiac silhouette remains enlarged. The central pulmonary vascularity is prominent. The ICD is in stable position. There is calcification in the wall of the aortic arch. The sternal wires are intact.  Calcified the observed bony thorax is unremarkable. There is a moderate amount of gas and fluid in the upper abdomen. IMPRESSION: Left lower lobe pneumonia. Patchy interstitial density in both lungs has appeared since the previous study and may reflect a pneumonitis type pattern or less likely pulmonary edema. Followup PA and lateral chest X-ray is recommended in 3-4 weeks following trial of antibiotic therapy to ensure resolution and exclude underlying malignancy. Cardiomegaly.  Previous CABG.  Thoracic aortic atherosclerosis. Electronically Signed   By: Bessie Boyte  [place] M.D.   On: 10/07/2017 14:03   Dg Chest Portable 1 View  Result Date: 10/12/2017 CLINICAL DATA:  Shortness of breath. EXAM: PORTABLE CHEST 1 VIEW COMPARISON:  Chest x-ray dated October 07, 2017. FINDINGS: Unchanged left chest wall AICD. Stable cardiomegaly. Mild central vascular congestion. Unchanged retrocardiac opacity. No large pleural effusion. No pneumothorax. No acute osseous abnormality. IMPRESSION: 1. Stable cardiomegaly and mild central vascular congestion. No overt pulmonary edema. 2. Unchanged left lower lobe pneumonia. Electronically Signed   By: Titus Dubin M.D.   On: 10/12/2017 17:54    Assessment:   Adam Wall is a 72 y.o. male with myeloma, on chemo admitted with increasing sob and found to have LLL infiltrate, influenza and MRSA bacteremia.  Has sacral and heel ulcers with are potential sources of MRSA bacteremia infection.  Course complicated by cytopenias and ARF.  He will need evaluation for endocarditis as well as evaluation by EP for removal of the AICD which is likely seeded.   Recommendations Continue vanco and tamiflu. Repeat BCX Check echo If TTE negative will need TEE Will  Consult cardiology - sees Dr Nehemiah Massed.  AICD was placed at [facility] it seems Discussed with patient and wife. Thank you very much for allowing me to participate in the care of this patient. Please call with questions.   Cheral Marker. Ola Spurr, MD

## 2017-10-14 ENCOUNTER — Inpatient Hospital Stay: Payer: Medicare Other

## 2017-10-14 ENCOUNTER — Inpatient Hospital Stay: Payer: Medicare Other | Admitting: Oncology

## 2017-10-14 DIAGNOSIS — Z515 Encounter for palliative care: Secondary | ICD-10-CM

## 2017-10-14 DIAGNOSIS — R7881 Bacteremia: Secondary | ICD-10-CM

## 2017-10-14 DIAGNOSIS — J1008 Influenza due to other identified influenza virus with other specified pneumonia: Secondary | ICD-10-CM

## 2017-10-14 DIAGNOSIS — Z7189 Other specified counseling: Secondary | ICD-10-CM

## 2017-10-14 DIAGNOSIS — J9690 Respiratory failure, unspecified, unspecified whether with hypoxia or hypercapnia: Secondary | ICD-10-CM

## 2017-10-14 DIAGNOSIS — I5043 Acute on chronic combined systolic (congestive) and diastolic (congestive) heart failure: Secondary | ICD-10-CM

## 2017-10-14 LAB — BASIC METABOLIC PANEL
ANION GAP: 11 (ref 5–15)
BUN: 74 mg/dL — ABNORMAL HIGH (ref 6–20)
CALCIUM: 6.6 mg/dL — AB (ref 8.9–10.3)
CHLORIDE: 108 mmol/L (ref 101–111)
CO2: 17 mmol/L — AB (ref 22–32)
Creatinine, Ser: 3.13 mg/dL — ABNORMAL HIGH (ref 0.61–1.24)
GFR calc non Af Amer: 18 mL/min — ABNORMAL LOW (ref >60)
GFR, EST AFRICAN AMERICAN: 21 mL/min — AB (ref >60)
GLUCOSE: 256 mg/dL — AB (ref 65–99)
POTASSIUM: 4.4 mmol/L (ref 3.5–5.1)
Sodium: 136 mmol/L (ref 135–145)

## 2017-10-14 LAB — GLUCOSE, CAPILLARY
GLUCOSE-CAPILLARY: 146 mg/dL — AB (ref 65–99)
Glucose-Capillary: 259 mg/dL — ABNORMAL HIGH (ref 65–99)
Glucose-Capillary: 278 mg/dL — ABNORMAL HIGH (ref 65–99)
Glucose-Capillary: 254 mg/dL — ABNORMAL HIGH (ref 65–99)

## 2017-10-14 LAB — CBC
HEMATOCRIT: 24.1 % — AB (ref 40.0–52.0)
HEMOGLOBIN: 7.9 g/dL — AB (ref 13.0–18.0)
MCH: 23 pg — ABNORMAL LOW (ref 26.0–34.0)
MCHC: 32.8 g/dL (ref 32.0–36.0)
MCV: 70.1 fL — AB (ref 80.0–100.0)
Platelets: 87 10*3/uL — ABNORMAL LOW (ref 150–440)
RBC: 3.44 MIL/uL — AB (ref 4.40–5.90)
RDW: 20.6 % — ABNORMAL HIGH (ref 11.5–14.5)
WBC: 2.9 10*3/uL — AB (ref 3.8–10.6)

## 2017-10-14 LAB — ECHOCARDIOGRAM COMPLETE
HEIGHTINCHES: 72 in
Weight: 3097.02 oz

## 2017-10-14 LAB — URINALYSIS, COMPLETE (UACMP) WITH MICROSCOPIC
BACTERIA UA: NONE SEEN
Bilirubin Urine: NEGATIVE
Glucose, UA: NEGATIVE mg/dL
KETONES UR: NEGATIVE mg/dL
Nitrite: NEGATIVE
PH: 7 (ref 5.0–8.0)
Protein, ur: 100 mg/dL — AB
Specific Gravity, Urine: 1.011 (ref 1.005–1.030)

## 2017-10-14 LAB — HIV ANTIBODY (ROUTINE TESTING W REFLEX): HIV Screen 4th Generation wRfx: NONREACTIVE

## 2017-10-14 LAB — VANCOMYCIN, RANDOM: VANCOMYCIN RM: 20

## 2017-10-14 MED ORDER — INSULIN DETEMIR 100 UNIT/ML ~~LOC~~ SOLN
3.0000 [IU] | Freq: Two times a day (BID) | SUBCUTANEOUS | Status: DC
  Administered 2017-10-14 – 2017-10-17 (×7): 3 [IU] via SUBCUTANEOUS
  Filled 2017-10-14 (×10): qty 0.03

## 2017-10-14 NOTE — Consult Note (Signed)
Cardiology Consultation Note    Patient ID: Adam Wall, MRN: [id number], DOB/AGE: Mar 20, 1946 72 y.o. Admit date: 10/12/2017   Date of Consult: 10/14/2017 Primary Physician: [name], MD Primary Cardiologist: Dr. Nehemiah Massed  Chief Complaint: sob and cough Reason for Consultation: bacteremia Requesting MD: Dr. Leslye Peer  HPI: Adam Wall is a 71 y.o. 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  His light chain multiple myeloma is being treated with Revlimid, dexamethasone and Velcade.  He has pancytopenia with a hemoglobin of 7.9 with a hematocrit of 24.1 and a white count of 2.9 and platelet count of 87,000.  He has acute on chronic renal insufficiency with a serum creatinine of 3.13 with a GFR of 21 which is down from a GFR of 46 2 weeks ago with a creatinine at that time of 1.64.  He was influenza A positive and influenza B negative.  He underwent blood cultures which were positive for MRSA.  He is improved with some hydration and has been placed on aggressive antibiotics.  He is hemodynamically stable.  Chest x-ray revealed left lower lobe pneumonia with stable cardiomegaly and mild central vascular congestion with no overt pulmonary edema.  He has some shortness of breath and weakness and fatigue.  Electrocardiogram shows atrial/ventricular paced rhythm.  Patient denies any ICD firings.  His serum troponin is mildly elevated 1.22.  BNP is 534 up from a level of 137 1 month ago.   He denies chest pain. Past Medical History:  Diagnosis Date  . AICD (automatic cardioverter/defibrillator) present   . Anemia   . Blockage of coronary artery of heart ([place])   . Cancer ([place])   . CHF (congestive heart failure) ([place])   . Chronic kidney disease 05/22/2017   nephrectomy-right  . Diabetes mellitus without complication ([place])   . Dysrhythmia   . Heart murmur   . Hypertension   . Hypochloremia   . LBBB (left bundle branch block)   . Neuropathy   . Presence of permanent cardiac pacemaker   . Right renal mass   . 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 CARDIAC SURGERY    . CORONARY ARTERY BYPASS GRAFT    . ENDOBRONCHIAL ULTRASOUND N/A 04/03/2017   Procedure: ENDOBRONCHIAL ULTRASOUND;  Surgeon: Flora Lipps, MD;  Location: [facility] ORS;  Service: Cardiopulmonary;  Laterality: N/A;  . Heart Bypass    . IR KYPHO LUMBAR INC FX REDUCE BONE BX UNI/BIL CANNULATION INC/IMAGING  09/12/2017  . PACEMAKER PLACEMENT     ICD     Home Meds: Prior to Admission medications   Medication Sig Start Date End Date Taking? Authorizing Provider  acetaminophen (TYLENOL) 325 MG tablet Take 2 tablets (650 mg total) by mouth every 6 (six) hours as needed for mild pain (or Fever >/= 101). 09/16/17  Yes Wieting, Richard, MD  acyclovir (ZOVIRAX) 400 MG tablet Take 1 tablet (400 mg total) by mouth daily. 08/18/17  Yes Sindy Guadeloupe, MD  albuterol (PROVENTIL) (2.5  MG/3ML) 0.083% nebulizer solution Take 2.5 mg by nebulization every 4 (four) hours as needed for wheezing or shortness of breath.   Yes [provider]  alendronate (FOSAMAX) 70 MG tablet Take 1 tablet (70 mg total) by mouth once a week. Take with a full glass of water on an empty stomach. 09/16/17  Yes Wieting, Richard, MD  atorvastatin (LIPITOR) 40 MG tablet Take 40 mg by mouth at bedtime. 02/27/17  Yes [provider]  calcium-vitamin D (OSCAL WITH D) 500-200 MG-UNIT  tablet Take 1 tablet by mouth 3 (three) times daily with meals. Patient taking differently: Take 1 tablet by mouth 2 (two) times daily.  09/16/17  Yes Wieting, Richard, MD  carvedilol (COREG) 25 MG tablet Take 25 mg by mouth 2 (two) times daily with a meal.   Yes [provider]  diclofenac sodium (VOLTAREN) 1 % GEL Apply 4 g topically 4 (four) times daily.   Yes [provider]  docusate sodium (COLACE) 100 MG capsule Take 1 capsule (100 mg total) by mouth 2 (two) times daily. 09/16/17  Yes Wieting, Richard, MD  fentaNYL (DURAGESIC - DOSED MCG/HR) 12 MCG/HR Place 1 patch (12.5 mcg total) onto the skin every 3 (three) days. 09/16/17  Yes Wieting, Richard, MD  furosemide (LASIX) 40 MG tablet Take 20-40 mg by mouth daily. Pt takes 1 pill one day and alternates with 0.5 pill the next day ( this is alternating dose every other day)   Yes [provider]  gabapentin (NEURONTIN) 100 MG capsule Take 100 mg by mouth daily as needed (pain).    Yes [provider]  insulin NPH-regular Human (NOVOLIN 70/30) (70-30) 100 UNIT/ML injection 8u (AM) and 8u (PM) Patient taking differently: Inject 8 Units into the skin 2 (two) times daily with a meal.  09/16/17  Yes Wieting, Richard, MD  loperamide (IMODIUM A-D) 2 MG tablet Take 2 mg by mouth every 6 (six) hours as needed for diarrhea or loose stools.   Yes [provider]  menthol-cetylpyridinium (CEPACOL) 3 MG lozenge Take 1 lozenge by mouth 4 (four) times daily -  before meals and at bedtime. 10/11/17 10/15/17 Yes [provider]  ondansetron (ZOFRAN) 4 MG tablet Take 4 mg by mouth every 6 (six) hours as needed for nausea or vomiting.   Yes [provider]  oxyCODONE (OXY IR/ROXICODONE) 5 MG immediate release tablet Take 1 tablet (5 mg total) by mouth every 4 (four) hours as needed for moderate pain or severe pain. 09/16/17  Yes Wieting, Richard, MD  potassium chloride SA (K-DUR,KLOR-CON) 20 MEQ tablet Take 20  mEq by mouth 2 (two) times daily.   Yes [provider]  tamsulosin (FLOMAX) 0.4 MG CAPS capsule Take 0.4 mg by mouth daily.   Yes [provider]  traZODone (DESYREL) 50 MG tablet Take 0.5 tablets (25 mg total) by mouth at bedtime as needed for sleep. Patient taking differently: Take 50 mg by mouth at bedtime.  09/16/17  Yes Wieting, Richard, MD  vitamin B-12 (CYANOCOBALAMIN) 1000 MCG tablet Take 1,000 mcg by mouth daily.   Yes [provider]  vitamin C (ASCORBIC ACID) 500 MG tablet Take 500 mg by mouth daily.   Yes [provider]  prochlorperazine (COMPAZINE) 10 MG tablet Take 1 tablet (10 mg total) by mouth every 6 (six) hours as needed (Nausea or vomiting). 08/18/17 09/04/17  Sindy Guadeloupe, MD    Inpatient Medications:  . acyclovir  400 mg Oral Daily  .  atorvastatin  40 mg Oral QHS  . budesonide (PULMICORT) nebulizer solution  0.25 mg Nebulization Q6H  . calcium-vitamin D  1 tablet Oral BID  . carvedilol  25 mg Oral BID WC  . docusate sodium  100 mg Oral BID  . fentaNYL  12.5 mcg Transdermal Q72H  . furosemide  80 mg Intravenous Daily  . heparin  5,000 Units Subcutaneous Q8H  . insulin aspart  0-20 Units Subcutaneous TID WC  . insulin aspart  0-5 Units Subcutaneous QHS  . insulin detemir  3 Units Subcutaneous BID  . ipratropium-albuterol  3 mL Nebulization Q6H  . mupirocin ointment   Topical Daily  . oseltamivir  30 mg Oral Daily  . potassium chloride SA  20 mEq Oral BID  . tamsulosin  0.4 mg Oral Daily  . traZODone  50 mg Oral QHS  . vitamin B-12  1,000 mcg Oral Daily  . vitamin C  500 mg Oral Daily   . sodium chloride    . vancomycin 1,000 mg (10/13/17 2320)    Allergies: No Known Allergies  Social History   Socioeconomic History  . Marital status: Married    Spouse name: Not on file  . Number of children: Not on file  . Years of education: Not on file  . Highest education level: Not on file  Social Needs  . Financial resource  strain: Not on file  . Food insecurity - worry: Not on file  . Food insecurity - inability: Not on file  . Transportation needs - medical: Not on file  . Transportation needs - non-medical: Not on file  Occupational History  . Not on file  Tobacco Use  . Smoking status: Former Smoker    Years: 10.00    Types: Cigars    Last attempt to quit: 10/04/1993    Years since quitting: 24.0  . Smokeless tobacco: Never Used  . Tobacco comment: 3 CIGARS PER DAY  Substance and Sexual Activity  . Alcohol use: No  . Drug use: No  . Sexual activity: Not on file  Other Topics Concern  . Not on file  Social History Narrative  . Not on file     Family History  Adopted: Yes  Problem Relation Age of Onset  . Heart failure Paternal Grandmother      Review of Systems: A 12-system review of systems was performed and is negative except as noted in the HPI.  Labs: Recent Labs    10/12/17 1640  TROPONINI 1.22*   Lab Results  Component Value Date   WBC 2.9 (L) 10/14/2017   HGB 7.9 (L) 10/14/2017   HCT 24.1 (L) 10/14/2017   MCV 70.1 (L) 10/14/2017   PLT 87 (L) 10/14/2017    Recent Labs  Lab 10/12/17 1640  10/14/17 0533  NA 141   < > 136  K 4.1   < > 4.4  CL 113*   < > 108  CO2 20*   < > 17*  BUN 53*   < > 74*  CREATININE 2.54*   < > 3.13*  CALCIUM 7.0*   < > 6.6*  PROT 5.6*  --   --   BILITOT 0.8  --   --   ALKPHOS 61  --   --   ALT 41  --   --   AST 65*  --   --   GLUCOSE 196*   < > 256*   < > = values in this interval not displayed.  No results found for: CHOL, HDL, LDLCALC, TRIG No results found for: DDIMER  Radiology/Studies:  Dg Chest 2 View  Result Date: 10/07/2017 CLINICAL DATA:  Fever, productive cough, pleuritic chest pain, fever. History of multiple myeloma, diabetes, coronary artery disease and previous MI, CHF, former smoker. EXAM: CHEST  2 VIEW COMPARISON:  Portable chest x-ray of July 11, 2018 FINDINGS: The lungs are well-expanded. The interstitial  markings are patchy and coarse. There is an infiltrate in the left lower lobe posteriorly. The cardiac silhouette remains enlarged. The central pulmonary vascularity is prominent. The ICD is in stable position. There is calcification in the wall of the aortic arch. The sternal wires are intact. Calcified the observed bony thorax is unremarkable. There is a moderate amount of gas and fluid in the upper abdomen. IMPRESSION: Left lower lobe pneumonia. Patchy interstitial density in both lungs has appeared since the previous study and may reflect a pneumonitis type pattern or less likely pulmonary edema. Followup PA and lateral chest X-ray is recommended in 3-4 weeks following trial of antibiotic therapy to ensure resolution and exclude underlying malignancy. Cardiomegaly.  Previous CABG.  Thoracic aortic atherosclerosis. Electronically Signed   By: David  Martinique M.D.   On: 10/07/2017 14:03   Dg Chest Port 1 View  Result Date: 10/14/2017 CLINICAL DATA:  Respiratory failure EXAM: PORTABLE CHEST 1 VIEW COMPARISON:  10/12/2017 FINDINGS: Left AICD remains in place, unchanged. Prior CABG. Cardiomegaly. Improving vascular congestion. Left perihilar and lower lobe atelectasis or infiltrate noted. No visible effusions or acute bony abnormality. IMPRESSION: Cardiomegaly.  Improving vascular congestion. Left perihilar and lower lobe atelectasis or infiltrate. Electronically Signed   By: [name] M.D.   On: 10/14/2017 07:48   Dg Chest Portable 1 View  Result Date: 10/12/2017 CLINICAL DATA:  Shortness of breath. EXAM: PORTABLE CHEST 1 VIEW COMPARISON:  Chest x-ray dated October 07, 2017. FINDINGS: Unchanged left chest wall AICD. Stable cardiomegaly. Mild central vascular congestion. Unchanged retrocardiac opacity. No large pleural effusion. No pneumothorax. No acute osseous abnormality. IMPRESSION: 1. Stable cardiomegaly and mild central vascular congestion. No overt pulmonary edema. 2. Unchanged left lower lobe  pneumonia. Electronically Signed   By: Titus Dubin M.D.   On: 10/12/2017 17:54    Wt Readings from Last 3 Encounters:  10/13/17 87.8 kg (193 lb 9 oz)  09/23/17 91.6 kg (202 lb)  09/16/17 87.5 kg (193 lb)    EKG: AV paced rhythm  Physical Exam:  Blood pressure (!) 92/56, pulse (!) 120, temperature (!) 97.5 F (36.4 C), temperature source Oral, resp. rate 20, height 6' (1.829 m), weight 87.8 kg (193 lb 9 oz), SpO2 100 %. Body mass index is 26.25 kg/m. General: Well developed, somewhat fatigued. Head: Normocephalic, atraumatic, sclera non-icteric, no xanthomas, nares are without discharge.  Neck: Negative for carotid bruits. JVD not elevated. Lungs: Clear bilaterally to auscultation without wheezes.  Bilateral diffuse rhonchi. Heart: RRR with S1 S2.  2/6 systolic murmur radiating to the left upper and right upper sternal borders.. Abdomen: Soft, non-tender, non-distended with normoactive bowel sounds. No hepatomegaly. No rebound/guarding. No obvious abdominal masses. Msk:  Strength and tone appear normal for age. Extremities: No clubbing or cyanosis.  2+ edema bilateral lower extremities.  Distal pedal pulses are 2+ and equal bilaterally. Neuro: Alert and oriented X 3. No facial asymmetry. No focal deficit. Moves all extremities spontaneously. Psych:  Responds to questions appropriately with a normal affect.     Assessment and Plan  Patient is a 72 year old male with  multiple medical problems including a history of ischemic cardiomyopathy status post coronary artery bypass grafting in 2001 with an improved LV systolic ejection fraction of around 50 by echocardiogram last year with an AICD in place.  He also has a history of IgG kappa multiple myeloma currently being treated with Velcade, dexamethasone and Revlimid with pancytopenia with anemia, platelet count of 87,000 and hemoglobin 7.9 with a white count of 2.9.  He was admitted with weakness and fatigue and noted to be influenza A  positive.  He also has positive blood cultures for MRSA.  He is currently on aggressive antibiotics.  He has mildly aortic stenosis with some mild systolic murmur.  We will need to address his bacteremia.  Staphylococcus typically has infiltrated his AICD leads and generator.  We will also need to assess whether or not he has had evidence of vegetations on his valve.  The patient is extremely high risk for device extraction.  His leads have been in place since 2016 making extraction somewhat more high risk.  He also has comorbid condition including renal insufficiency, pancytopenia, thrombocytopenia as well as ongoing sepsis.  Would continue with aggressive antibiotic therapy.  We will proceed with a transthoracic echo which is scheduled today to determine if there is any evidence of endocarditis on his aortic valve or the other valves.  We will also need to proceed with a transesophageal echo to better evaluate valvular structures as well as to better visualize his AICD leads.  We will need to closely follow his renal function and dose medications appropriately.  Due to the extreme high risk for device extraction, would attempt to suppress his MRSA bacteremia with antibiotics and follow his hemodynamics and clinical condition.  Will review transthoracic echo when available and proceed with a transesophageal echo in the morning.  Further recommendations after this is complete.  Have discussed this with electrophysiology as well as infectious disease consultant.  Signed, Teodoro Spray MD 10/14/2017, 10:39 AM Pager: [phone]

## 2017-10-14 NOTE — Progress Notes (Signed)
Hematology/Oncology Consult note [facility]  [phone] [phone]  Patient Care Team: [name], MD as PCP - General (Pediatrics)   Name of the patient: Adam Wall  [id number]  74/17/1947   Date of visit: 10/14/2017   Interval history- still feels fatigued but sob has improved. He appears more alert today as compared to last week  ECOG PS- 3 Pain scale- 4 Opioid associated constipation- no  Review of systems- Review of Systems  Constitutional: Positive for malaise/fatigue. Negative for chills, fever and weight loss.  HENT: Negative for congestion, ear discharge and nosebleeds.   Eyes: Negative for blurred vision.  Respiratory: Positive for shortness of breath. Negative for cough, hemoptysis, sputum production and wheezing.   Cardiovascular: Negative for chest pain, palpitations, orthopnea and claudication.  Gastrointestinal: Negative for abdominal pain, blood in stool, constipation, diarrhea, heartburn, melena, nausea and vomiting.  Genitourinary: Negative for dysuria, flank pain, frequency, hematuria and urgency.  Musculoskeletal: Positive for back pain. Negative for joint pain and myalgias.  Skin: Negative for rash.  Neurological: Positive for weakness. Negative for dizziness, tingling, focal weakness, seizures and headaches.  Endo/Heme/Allergies: Does not bruise/bleed easily.  Psychiatric/Behavioral: Negative for depression and suicidal ideas. The patient does not have insomnia.      No Known Allergies   Past Medical History:  Diagnosis Date  . AICD (automatic cardioverter/defibrillator) present   . Anemia   . Blockage of coronary artery of heart ([place])   . Cancer ([place])   . CHF (congestive heart failure) ([place])   . Chronic kidney disease 05/22/2017   nephrectomy-right  . Diabetes mellitus without complication ([place])   . Dysrhythmia   . Heart murmur   . Hypertension   . Hypochloremia   . LBBB (left bundle branch  block)   . Neuropathy   . Presence of permanent cardiac pacemaker   . Right renal mass   . Smoldering myeloma [facility])      Past Surgical History:  Procedure Laterality Date  . CARDIAC SURGERY    . CORONARY ARTERY BYPASS GRAFT    . ENDOBRONCHIAL ULTRASOUND N/A 04/03/2017   Procedure: ENDOBRONCHIAL ULTRASOUND;  Surgeon: Flora Lipps, MD;  Location: [facility] ORS;  Service: Cardiopulmonary;  Laterality: N/A;  . Heart Bypass    . IR KYPHO LUMBAR INC FX REDUCE BONE BX UNI/BIL CANNULATION INC/IMAGING  09/12/2017  . PACEMAKER PLACEMENT     ICD    Social History   Socioeconomic History  . Marital status: Married    Spouse name: Not on file  . Number of children: Not on file  . Years of education: Not on file  . Highest education level: Not on file  Social Needs  . Financial resource strain: Not on file  . Food insecurity - worry: Not on file  . Food insecurity - inability: Not on file  . Transportation needs - medical: Not on file  . Transportation needs - non-medical: Not on file  Occupational History  . Not on file  Tobacco Use  . Smoking status: Former Smoker    Years: 10.00    Types: Cigars    Last attempt to quit: 10/04/1993    Years since quitting: 24.0  . Smokeless tobacco: Never Used  . Tobacco comment: 3 CIGARS PER DAY  Substance and Sexual Activity  . Alcohol use: No  . Drug use: No  . Sexual activity: Not on file  Other Topics Concern  . Not on file  Social History Narrative  .  Not on file    Family History  Adopted: Yes  Problem Relation Age of Onset  . Heart failure Paternal Grandmother      Current Facility-Administered Medications:  .  0.9 %  sodium chloride infusion, 250 mL, Intravenous, PRN, Salary, Montell D, MD .  acetaminophen (TYLENOL) tablet 650 mg, 650 mg, Oral, Q4H PRN, Salary, Montell D, MD .  acyclovir (ZOVIRAX) 200 MG capsule 400 mg, 400 mg, Oral, Daily, Salary, Montell D, MD, 400 mg at 10/13/17 0942 .  atorvastatin (LIPITOR) tablet 40 mg, 40  mg, Oral, QHS, Salary, Montell D, MD, 40 mg at 10/13/17 2206 .  budesonide (PULMICORT) nebulizer solution 0.25 mg, 0.25 mg, Nebulization, Q6H, Wilhelmina Mcardle, MD, 0.25 mg at 10/14/17 0214 .  calcium-vitamin D (OSCAL WITH D) 500-200 MG-UNIT per tablet 1 tablet, 1 tablet, Oral, BID, Salary, Montell D, MD, 1 tablet at 10/13/17 2206 .  carvedilol (COREG) tablet 25 mg, 25 mg, Oral, BID WC, Salary, Montell D, MD, 25 mg at 10/13/17 1720 .  docusate sodium (COLACE) capsule 100 mg, 100 mg, Oral, BID, Salary, Montell D, MD, 100 mg at 10/13/17 2206 .  fentaNYL (DURAGESIC - dosed mcg/hr) 12.5 mcg, 12.5 mcg, Transdermal, Q72H, Salary, Montell D, MD, 12.5 mcg at 10/13/17 0130 .  furosemide (LASIX) injection 80 mg, 80 mg, Intravenous, Daily, Merton Border B, MD .  heparin injection 5,000 Units, 5,000 Units, Subcutaneous, Q8H, Salary, Montell D, MD, 5,000 Units at 10/14/17 0650 .  hydrALAZINE (APRESOLINE) injection 10-40 mg, 10-40 mg, Intravenous, Q4H PRN, Merton Border B, MD .  insulin aspart (novoLOG) injection 0-20 Units, 0-20 Units, Subcutaneous, TID WC, Salary, Montell D, MD, 4 Units at 10/13/17 1723 .  insulin aspart (novoLOG) injection 0-5 Units, 0-5 Units, Subcutaneous, QHS, Salary, Montell D, MD, 2 Units at 10/13/17 2206 .  ipratropium-albuterol (DUONEB) 0.5-2.5 (3) MG/3ML nebulizer solution 3 mL, 3 mL, Nebulization, Q6H, Wilhelmina Mcardle, MD, 3 mL at 10/14/17 0214 .  loperamide (IMODIUM) capsule 2 mg, 2 mg, Oral, Q6H PRN, Salary, Montell D, MD .  mupirocin ointment (BACTROBAN) 2 %, , Topical, Daily, Wieting, Richard, MD .  ondansetron (ZOFRAN) injection 4 mg, 4 mg, Intravenous, Q6H PRN, Salary, Montell D, MD .  oseltamivir (TAMIFLU) capsule 30 mg, 30 mg, Oral, Daily, Salary, Montell D, MD, 30 mg at 10/13/17 0942 .  oxyCODONE (Oxy IR/ROXICODONE) immediate release tablet 5 mg, 5 mg, Oral, Q4H PRN, Salary, Montell D, MD, 5 mg at 10/13/17 2326 .  potassium chloride SA (K-DUR,KLOR-CON) CR tablet 20 mEq, 20  mEq, Oral, BID, Salary, Montell D, MD, 20 mEq at 10/13/17 2206 .  tamsulosin (FLOMAX) capsule 0.4 mg, 0.4 mg, Oral, Daily, Salary, Montell D, MD, 0.4 mg at 10/13/17 0942 .  traZODone (DESYREL) tablet 50 mg, 50 mg, Oral, QHS, Salary, Montell D, MD, 50 mg at 10/13/17 2206 .  vancomycin (VANCOCIN) IVPB 1000 mg/200 mL premix, 1,000 mg, Intravenous, Q24H, Hallaji, Sheema M, RPH, Last Rate: 200 mL/hr at 10/13/17 2320, 1,000 mg at 10/13/17 2320 .  vitamin B-12 (CYANOCOBALAMIN) tablet 1,000 mcg, 1,000 mcg, Oral, Daily, Salary, Montell D, MD, 1,000 mcg at 10/13/17 0942 .  vitamin C (ASCORBIC ACID) tablet 500 mg, 500 mg, Oral, Daily, Salary, Montell D, MD, 500 mg at 10/13/17 0942  Physical exam:  Vitals:   10/13/17 1922 10/14/17 0215 10/14/17 0700 10/14/17 0730  BP:   (!) 84/72   Pulse:   (!) 119 (!) 117  Resp:   12 18  Temp:    Marland Kitchen)  97.5 F (36.4 C)  TempSrc:    Oral  SpO2: 97% 98% 99% 96%  Weight:      Height:       Physical Exam  Constitutional: He is oriented to person, place, and time.  Fatigued. Appears in no acute distress  HENT:  Head: Normocephalic and atraumatic.  Eyes: EOM are normal. Pupils are equal, round, and reactive to light.  Neck: Normal range of motion.  Cardiovascular: Normal rate, regular rhythm and normal heart sounds.  Pulmonary/Chest: Effort normal and breath sounds normal.  Abdominal: Soft. Bowel sounds are normal.  Musculoskeletal: He exhibits edema.  Neurological: He is alert and oriented to person, place, and time.  Skin: Skin is warm and dry.     CMP Latest Ref Rng & Units 10/14/2017  Glucose 65 - 99 mg/dL 256(H)  BUN 6 - 20 mg/dL 74(H)  Creatinine 0.61 - 1.24 mg/dL 3.13(H)  Sodium 135 - 145 mmol/L 136  Potassium 3.5 - 5.1 mmol/L 4.4  Chloride 101 - 111 mmol/L 108  CO2 22 - 32 mmol/L 17(L)  Calcium 8.9 - 10.3 mg/dL 6.6(L)  Total Protein 6.5 - 8.1 g/dL -  Total Bilirubin 0.3 - 1.2 mg/dL -  Alkaline Phos 38 - 126 U/L -  AST 15 - 41 U/L -  ALT 17 - 63  U/L -   CBC Latest Ref Rng & Units 10/14/2017  WBC 3.8 - 10.6 K/uL 2.9(L)  Hemoglobin 13.0 - 18.0 g/dL 7.9(L)  Hematocrit 40.0 - 52.0 % 24.1(L)  Platelets 150 - 440 K/uL 87(L)    @IMAGES @  Dg Chest 2 View  Result Date: 10/07/2017 CLINICAL DATA:  Fever, productive cough, pleuritic chest pain, fever. History of multiple myeloma, diabetes, coronary artery disease and previous MI, CHF, former smoker. EXAM: CHEST  2 VIEW COMPARISON:  Portable chest x-ray of July 11, 2018 FINDINGS: The lungs are well-expanded. The interstitial markings are patchy and coarse. There is an infiltrate in the left lower lobe posteriorly. The cardiac silhouette remains enlarged. The central pulmonary vascularity is prominent. The ICD is in stable position. There is calcification in the wall of the aortic arch. The sternal wires are intact. Calcified the observed bony thorax is unremarkable. There is a moderate amount of gas and fluid in the upper abdomen. IMPRESSION: Left lower lobe pneumonia. Patchy interstitial density in both lungs has appeared since the previous study and may reflect a pneumonitis type pattern or less likely pulmonary edema. Followup PA and lateral chest X-ray is recommended in 3-4 weeks following trial of antibiotic therapy to ensure resolution and exclude underlying malignancy. Cardiomegaly.  Previous CABG.  Thoracic aortic atherosclerosis. Electronically Signed   By: David  Martinique M.D.   On: 10/07/2017 14:03   Dg Chest Port 1 View  Result Date: 10/14/2017 CLINICAL DATA:  Respiratory failure EXAM: PORTABLE CHEST 1 VIEW COMPARISON:  10/12/2017 FINDINGS: Left AICD remains in place, unchanged. Prior CABG. Cardiomegaly. Improving vascular congestion. Left perihilar and lower lobe atelectasis or infiltrate noted. No visible effusions or acute bony abnormality. IMPRESSION: Cardiomegaly.  Improving vascular congestion. Left perihilar and lower lobe atelectasis or infiltrate. Electronically Signed   By: [name] M.D.   On: 10/14/2017 07:48   Dg Chest Portable 1 View  Result Date: 10/12/2017 CLINICAL DATA:  Shortness of breath. EXAM: PORTABLE CHEST 1 VIEW COMPARISON:  Chest x-ray dated October 07, 2017. FINDINGS: Unchanged left chest wall AICD. Stable cardiomegaly. Mild central vascular congestion. Unchanged retrocardiac opacity. No large pleural effusion. No pneumothorax. No  acute osseous abnormality. IMPRESSION: 1. Stable cardiomegaly and mild central vascular congestion. No overt pulmonary edema. 2. Unchanged left lower lobe pneumonia. Electronically Signed   By: Titus Dubin M.D.   On: 10/12/2017 17:54     Assessment and plan- Patient is a 72 y.o. male with IGG standard risk multiple myeloma admitted for acute hypoxic respiratory failure due to HCAP  1. Acute hypoxic respiratory failure due to HCAP and MRSA bacteremia FLU PCR + as well. Failed levaquin as an outpatient. TTE showed no endocarditis.  TEE done results pending. Appreciate ID input. He is on tamiflu for influenza. On vancomycin for mRSA bacteremia and will need that for 6 weeks IV.   2. Multiple myeloma- Patient supposed to start RVd lite cycle 3 on 10/14/17. That will be put on hold until he improves from his acute illness  3. Pancytopenia- secondary to multiple myeloma and chemotherapy. Thrombocytopenia improving and so is leukopenia which is mainly lymphopenia. His hemoglobin has been around 7 since the start of his treatment and likely due to myeloma. 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 AKI on CKD- creatinine had improved from 3 to 1.9 last month now back up to 3.13 due to recent infection and sepsis.  Patient was improving a few weeks ago from myeloma perspective. However his clincial course now complicated by HCAP, Flu and MRSA bacteremia. Performance status has also declined significantly. Hopefully if he can recover from all this we may be able to resume his myeloma treatment upon completion of IV antibiotics.        Visit Diagnosis 1. Respiratory distress   2. Acute congestive heart failure, unspecified heart failure type ([place])   3. Healthcare-associated pneumonia   4. Respiratory failure ([place])      Dr. Randa Evens, MD, MPH [facility] Pager- [phone] 10/14/2017 4:03 PM

## 2017-10-14 NOTE — Consult Note (Signed)
Consultation Note Date: 10/14/2017   Patient Name: Adam Wall  DOB: 1946-02-22  MRN: [id number]  Age / Sex: 72 y.o., male  PCP: [name], MD Referring Physician: [name], MD  Reason for Consultation: Establishing goals of care  HPI/Patient Profile: 72 y.o. male  with past medical history of multiple myeloma (previously being tx with revlimid, dex, velcade, on hold d/t acute illness), CHF, CAD, s/p AICD, CKD s/p nephrectomy, DM, admitted on 10/12/2017 with respiratory distress requiring bipap. Workup has revealed bacteremia +MRSA, AKI on CKD, CXR pulm congestion LLL pneumonia. He was being treated at [facility] for norovirus and PNA.   He is scheduled for a TEE to evaluate for vegetation around AICD leads or possible endocarditis. He is high risk for AICD extraction. This is his third admission in the last three months. Most recently he was admitted in January for compression fractures and was discharged to [facility] for rehab. Palliative medicine was consulted for "SNF resident with multiple medical problems. Goals of care. Poor candidate for intubation, ACLS or HD"   Clinical Assessment and Goals of Care:  I have reviewed medical records including EPIC notes, labs and imaging, assessed the patient and then met at the bedside along with to discuss diagnosis prognosis, GOC, EOL wishes, disposition and options.  I introduced Palliative Medicine as specialized medical care for people living with serious illness. It focuses on providing relief from the symptoms and stress of a serious illness. The goal is to improve quality of life for both the patient and the family.  We discussed a brief life review of the patient. He lived in [place]. He has one son and a wife. He enjoyed traveling and dining out.   As far as functional and nutritional status he has had a great decline  over the last year with multiple medical issues.     We discussed their current illness and what it means in the larger context of their on-going co-morbidities.  Natural disease trajectory and expectations at EOL were discussed.  I attempted to elicit values and goals of care important to the patient.  Currently, continuing medical care and attempting to regain strength to be able to return to living in his home is what he is hoping for. He feels that going to [facility] [facility] made him sick due to being placed with a roommate who was ill when he got there.   The difference between aggressive medical intervention and comfort care was explained.   Advanced directives, concepts specific to code status, and dialysis and rehospitalization were considered and discussed. He says that he has been asked these questions before and they are "well documented" in his chart. He isn't sure if he would want dialysis. He knows people who have been on dialysis for several years, but they are different from him. When we discussed code status, he says for now he would want full resuscitative efforts and understands that they would not bring him back to a place as well as he is now-  but he would "leave that in God's hands".   Primary Decision Maker PATIENT    SUMMARY OF RECOMMENDATIONS - Continue  Full scope of care -PMT will continue to follow and discuss [place]    Code Status/Advance Care Planning:  Full code  Palliative Prophylaxis:   Frequent Pain Assessment  Additional Recommendations (Limitations, Scope, Preferences):  Full Scope Treatment  Prognosis:    Unable to determine  Discharge Planning: To Be Determined  Primary Diagnoses: Present on Admission: . Acute respiratory failure with hypoxemia ([place])   I have reviewed the medical record, interviewed the patient and family, and examined the patient. The following aspects are pertinent.  Past Medical History:  Diagnosis Date  . AICD  (automatic cardioverter/defibrillator) present   . Anemia   . Blockage of coronary artery of heart ([place])   . Cancer ([place])   . CHF (congestive heart failure) ([place])   . Chronic kidney disease 05/22/2017   nephrectomy-right  . Diabetes mellitus without complication ([place])   . Dysrhythmia   . Heart murmur   . Hypertension   . Hypochloremia   . LBBB (left bundle branch block)   . Neuropathy   . Presence of permanent cardiac pacemaker   . Right renal mass   . Smoldering myeloma ([place])    Social History   Socioeconomic History  . Marital status: Married    Spouse name: None  . Number of children: None  . Years of education: None  . Highest education level: None  Social Needs  . Financial resource strain: None  . Food insecurity - worry: None  . Food insecurity - inability: None  . Transportation needs - medical: None  . Transportation needs - non-medical: None  Occupational History  . None  Tobacco Use  . Smoking status: Former Smoker    Years: 10.00    Types: Cigars    Last attempt to quit: 10/04/1993    Years since quitting: 24.0  . Smokeless tobacco: Never Used  . Tobacco comment: 3 CIGARS PER DAY  Substance and Sexual Activity  . Alcohol use: No  . Drug use: No  . Sexual activity: None  Other Topics Concern  . None  Social History Narrative  . None   Family History  Adopted: Yes  Problem Relation Age of Onset  . Heart failure Paternal Grandmother    Scheduled Meds: . acyclovir  400 mg Oral Daily  . atorvastatin  40 mg Oral QHS  . budesonide (PULMICORT) nebulizer solution  0.25 mg Nebulization Q6H  . calcium-vitamin D  1 tablet Oral BID  . carvedilol  25 mg Oral BID WC  . docusate sodium  100 mg Oral BID  . fentaNYL  12.5 mcg Transdermal Q72H  . furosemide  80 mg Intravenous Daily  . heparin  5,000 Units Subcutaneous Q8H  . insulin aspart  0-20 Units Subcutaneous TID WC  . insulin aspart  0-5 Units Subcutaneous QHS  . insulin detemir  3 Units Subcutaneous  BID  . ipratropium-albuterol  3 mL Nebulization Q6H  . mupirocin ointment   Topical Daily  . oseltamivir  30 mg Oral Daily  . potassium chloride SA  20 mEq Oral BID  . tamsulosin  0.4 mg Oral Daily  . traZODone  50 mg Oral QHS  . vitamin B-12  1,000 mcg Oral Daily  . vitamin C  500 mg Oral Daily   Continuous Infusions: . sodium chloride    . vancomycin 1,000 mg (10/13/17 2320)   PRN Meds:.sodium  chloride, acetaminophen, hydrALAZINE, loperamide, ondansetron (ZOFRAN) IV, oxyCODONE Medications Prior to Admission:  Prior to Admission medications   Medication Sig Start Date End Date Taking? Authorizing Provider  acetaminophen (TYLENOL) 325 MG tablet Take 2 tablets (650 mg total) by mouth every 6 (six) hours as needed for mild pain (or Fever >/= 101). 09/16/17  Yes Wieting, Richard, MD  acyclovir (ZOVIRAX) 400 MG tablet Take 1 tablet (400 mg total) by mouth daily. 08/18/17  Yes Sindy Guadeloupe, MD  albuterol (PROVENTIL) (2.5 MG/3ML) 0.083% nebulizer solution Take 2.5 mg by nebulization every 4 (four) hours as needed for wheezing or shortness of breath.   Yes [provider]  alendronate (FOSAMAX) 70 MG tablet Take 1 tablet (70 mg total) by mouth once a week. Take with a full glass of water on an empty stomach. 09/16/17  Yes Wieting, Richard, MD  atorvastatin (LIPITOR) 40 MG tablet Take 40 mg by mouth at bedtime. 02/27/17  Yes [provider]  calcium-vitamin D (OSCAL WITH D) 500-200 MG-UNIT tablet Take 1 tablet by mouth 3 (three) times daily with meals. Patient taking differently: Take 1 tablet by mouth 2 (two) times daily.  09/16/17  Yes Wieting, Richard, MD  carvedilol (COREG) 25 MG tablet Take 25 mg by mouth 2 (two) times daily with a meal.   Yes [provider]  diclofenac sodium (VOLTAREN) 1 % GEL Apply 4 g topically 4 (four) times daily.   Yes [provider]  docusate sodium (COLACE) 100 MG capsule Take 1 capsule (100 mg total) by mouth 2 (two) times daily.  09/16/17  Yes Wieting, Richard, MD  fentaNYL (DURAGESIC - DOSED MCG/HR) 12 MCG/HR Place 1 patch (12.5 mcg total) onto the skin every 3 (three) days. 09/16/17  Yes Wieting, Richard, MD  furosemide (LASIX) 40 MG tablet Take 20-40 mg by mouth daily. Pt takes 1 pill one day and alternates with 0.5 pill the next day ( this is alternating dose every other day)   Yes [provider]  gabapentin (NEURONTIN) 100 MG capsule Take 100 mg by mouth daily as needed (pain).    Yes [provider]  insulin NPH-regular Human (NOVOLIN 70/30) (70-30) 100 UNIT/ML injection 8u (AM) and 8u (PM) Patient taking differently: Inject 8 Units into the skin 2 (two) times daily with a meal.  09/16/17  Yes Wieting, Richard, MD  loperamide (IMODIUM A-D) 2 MG tablet Take 2 mg by mouth every 6 (six) hours as needed for diarrhea or loose stools.   Yes [provider]  menthol-cetylpyridinium (CEPACOL) 3 MG lozenge Take 1 lozenge by mouth 4 (four) times daily -  before meals and at bedtime. 10/11/17 10/15/17 Yes [provider]  ondansetron (ZOFRAN) 4 MG tablet Take 4 mg by mouth every 6 (six) hours as needed for nausea or vomiting.   Yes [provider]  oxyCODONE (OXY IR/ROXICODONE) 5 MG immediate release tablet Take 1 tablet (5 mg total) by mouth every 4 (four) hours as needed for moderate pain or severe pain. 09/16/17  Yes Wieting, Richard, MD  potassium chloride SA (K-DUR,KLOR-CON) 20 MEQ tablet Take 20 mEq by mouth 2 (two) times daily.   Yes [provider]  tamsulosin (FLOMAX) 0.4 MG CAPS capsule Take 0.4 mg by mouth daily.   Yes [provider]  traZODone (DESYREL) 50 MG tablet Take 0.5 tablets (25 mg total) by mouth at bedtime as needed for sleep. Patient taking differently: Take 50 mg by mouth at bedtime.  09/16/17  Yes [place],  Richard, MD  vitamin B-12 (CYANOCOBALAMIN) 1000 MCG tablet Take 1,000 mcg by mouth daily.   Yes [provider]  vitamin C (ASCORBIC  ACID) 500 MG tablet Take 500 mg by mouth daily.   Yes [provider]  prochlorperazine (COMPAZINE) 10 MG tablet Take 1 tablet (10 mg total) by mouth every 6 (six) hours as needed (Nausea or vomiting). 08/18/17 09/04/17  Sindy Guadeloupe, MD   No Known Allergies Review of Systems  Physical Exam  Vital Signs: BP 113/75   Pulse (!) 129   Temp (!) 97.5 F (36.4 C) (Oral)   Resp (!) 22   Ht 6' (1.829 m)   Wt 87.8 kg (193 lb 9 oz)   SpO2 (!) 88%   BMI 26.25 kg/m  Pain Assessment: No/denies pain POSS *See Group Information*: 1-Acceptable,Awake and alert Pain Score: 0-No pain   SpO2: SpO2: (!) 88 % O2 Device:SpO2: (!) 88 % O2 Flow Rate: .O2 Flow Rate (L/min): 3 L/min  IO: Intake/output summary:   Intake/Output Summary (Last 24 hours) at 10/14/2017 1422 Last data filed at 10/14/2017 1154 Gross per 24 hour  Intake 350 ml  Output 1650 ml  Net -1300 ml    LBM:   Baseline Weight: Weight: 90.7 kg (200 lb) Most recent weight: Weight: 87.8 kg (193 lb 9 oz)     Palliative Assessment/Data: PPS: 30%     Thank you for this consult. Palliative medicine will continue to follow and assist as needed.   Time In: 1300 Time Out: 1415 Time Total:75 mins  Greater than 50%  of this time was spent counseling and coordinating care related to the above assessment and plan.  Signed by: Mariana Kaufman, AGNP-C Palliative Medicine    Please contact Palliative Medicine Team phone at [phone] for questions and concerns.  For individual provider: See Shea Evans

## 2017-10-14 NOTE — Progress Notes (Signed)
Remains comfortable on [place] Denies pain and dyspnea No new complaints Poor urine output.  Bladder scan reveals 1 L.  Foley catheter placed.   Vitals:   10/14/17 1100 10/14/17 1200 10/14/17 1300 10/14/17 1400  BP: 104/68 113/75 (!) 122/95 (!) 93/44  Pulse: (!) 111 (!) 129 93 (!) 115  Resp: 14 (!) 22 (!) 21 18  Temp:      TempSrc:      SpO2: 98% (!) 88% 95% 96%  Weight:      Height:      3 LPM [place]  RASS 0 NAD HEENT WNL No JVD Bibasilar crackles, no wheezes Regular, soft systolic murmur noted Bowel sounds present, soft, NT Symmetric LE edema No focal neurologic deficits  BMP Latest Ref Rng & Units 10/14/2017 10/13/2017 10/12/2017  Glucose 65 - 99 mg/dL 256(H) 262(H) 196(H)  BUN 6 - 20 mg/dL 74(H) 57(H) 53(H)  Creatinine 0.61 - 1.24 mg/dL 3.13(H) 2.52(H) 2.54(H)  Sodium 135 - 145 mmol/L 136 141 141  Potassium 3.5 - 5.1 mmol/L 4.4 4.0 4.1  Chloride 101 - 111 mmol/L 108 113(H) 113(H)  CO2 22 - 32 mmol/L 17(L) 17(L) 20(L)  Calcium 8.9 - 10.3 mg/dL 6.6(L) 6.9(L) 7.0(L)    CBC Latest Ref Rng & Units 10/14/2017 10/13/2017 10/12/2017  WBC 3.8 - 10.6 K/uL 2.9(L) 2.4(L) 1.7(L)  Hemoglobin 13.0 - 18.0 g/dL 7.9(L) 7.8(L) 8.0(L)  Hematocrit 40.0 - 52.0 % 24.1(L) 23.7(L) 24.9(L)  Platelets 150 - 440 K/uL 87(L) 71(L) 71(L)    CXR 02/19: Retrocardiac opacity  Echocardiogram 2/18: Moderate LVH, LA mildly dilated, no valvular vegetations noted  IMPRESSION: SNF resident with complex medical history inc MM, DM2 Recently treated for PNA (completed 5 days Levofloxacin) Influenza A pneumonitis with bibasilar opacites MRSA bacteremia -concern for seeding of pacemaker wires Acute respiratory failure with hypoxemia - improving AKI/CKD Urinary retention MM with pancytopenia DM 2 -inadequately controlled  PLAN/REC: Continue supplemental oxygen as needed to maintain SPO2 >90% Continue nebulized bronchodilators Continue nebulized steroids Continue cardiac monitoring Monitor BMET  intermittently Monitor I/Os Correct electrolytes as indicated  Complete 5-day course of oseltamivir Continue vancomycin ID service following Will likely need TEE Continue SSI protocol Levemir initiated 2/19 Oncology following for multiple myeloma and pancytopenia Palliative care service following to address goals of care  Transfer to MedSurg floor has been ordered  After transfer, PCCM will sign off. Please call if we can be of further assistance  Merton Border, MD PCCM service Mobile [phone] Pager [phone] 10/14/2017 3:22 PM

## 2017-10-14 NOTE — Consult Note (Signed)
Pharmacy Antibiotic Note  Adam Wall is a 72 y.o. male admitted on 10/12/2017 with bacteremia.  Pharmacy has been consulted for Vancomycin dosing.  Patient ordered vancomycin 1g IV every 24 hours. Patient has received 3 doses to date.     2/17 BCID: MRSA  Plan: Serum creatinine levels continue to rise (2.52>>3.13). Hold vancomycin and dose based on follow up vanc levels.  First random level ordered for 2/19 @ 2300.  Pharmacy will continue to monitor and adjust per consult.  Height: 6' (182.9 cm) Weight: 193 lb 9 oz (87.8 kg) IBW/kg (Calculated) : 77.6  Temp (24hrs), Avg:98 F (36.7 C), Min:97.5 F (36.4 C), Max:98.6 F (37 C)  Recent Labs  Lab 10/12/17 1640 10/13/17 0459 10/14/17 0533  WBC 1.7* 2.4* 2.9*  CREATININE 2.54* 2.52* 3.13*    Estimated Creatinine Clearance: 23.1 mL/min (A) (by C-G formula based on SCr of 3.13 mg/dL (H)).    No Known Allergies  Antimicrobials this admission: 2/17 cefepime >> 2/18 2/17 vancomycin>  2/18 Influenza A (+)   Microbiology results: 2/18  BCx: MRSA 2/17  MRSA PCR: Positive   Thank you for allowing pharmacy to be a part of this patient's care.  [place] Ixchel Duck Pharmacy Intern 10/14/2017 10:44 AM

## 2017-10-14 NOTE — Progress Notes (Signed)
Patient ID: Adam Wall, male   DOB: Jul 09, 1946, 72 y.o.   MRN: [id number]  [facility] PROGRESS NOTE  Adam Wall MVE:[id number] DOB: [birth year] DOA: 10/12/2017 PCP: [name], MD  HPI/Subjective: Patient feeling a little bit better.  Feels pretty weak.  He was over at the rehab and ended up getting sick and brought into the hospital.  Found to have flu positive and positive blood cultures.  Objective: Vitals:   10/14/17 1300 10/14/17 1400  BP: (!) 122/95 (!) 93/44  Pulse: 93 (!) 115  Resp: (!) 21 18  Temp:    SpO2: 95% 96%    Filed Weights   10/12/17 1704 10/12/17 2158 10/13/17 0134  Weight: 90.7 kg (200 lb) 87.8 kg (193 lb 9 oz) 87.8 kg (193 lb 9 oz)    ROS: Review of Systems  Constitutional: Negative for chills and fever.  Eyes: Negative for blurred vision.  Respiratory: Positive for cough and shortness of breath.   Cardiovascular: Negative for chest pain.  Gastrointestinal: Negative for abdominal pain, constipation, diarrhea, nausea and vomiting.  Genitourinary: Negative for dysuria.  Musculoskeletal: Positive for back pain. Negative for joint pain.  Neurological: Negative for dizziness and headaches.   Exam: Physical Exam  Constitutional: He is oriented to person, place, and time.  HENT:  Nose: No mucosal edema.  Mouth/Throat: No oropharyngeal exudate or posterior oropharyngeal edema.  Eyes: Conjunctivae, EOM and lids are normal. Pupils are equal, round, and reactive to light.  Neck: No JVD present. Carotid bruit is not present. No edema present. No thyroid mass and no thyromegaly present.  Cardiovascular: S1 normal and S2 normal. Exam reveals no gallop.  No murmur heard. Pulses:      Dorsalis pedis pulses are 2+ on the right side, and 2+ on the left side.  Respiratory: No respiratory distress. He has decreased breath sounds in the right lower field and the left lower field. He has no wheezes. He has no rhonchi. He has no rales.  GI: Soft.  Bowel sounds are normal. There is no tenderness.  Musculoskeletal:       Right ankle: He exhibits swelling.       Left ankle: He exhibits swelling.  Lymphadenopathy:    He has no cervical adenopathy.  Neurological: He is alert and oriented to person, place, and time. No cranial nerve deficit.  Skin: Skin is warm. Nails show no clubbing.  Bilateral heel decubiti with discoloration of the skin bilaterally.  Stage II decubitus ulcer on buttock  Psychiatric: He has a normal mood and affect.      Data Reviewed: Basic Metabolic Panel: Recent Labs  Lab 10/12/17 1640 10/13/17 0459 10/14/17 0533  NA 141 141 136  K 4.1 4.0 4.4  CL 113* 113* 108  CO2 20* 17* 17*  GLUCOSE 196* 262* 256*  BUN 53* 57* 74*  CREATININE 2.54* 2.52* 3.13*  CALCIUM 7.0* 6.9* 6.6*  MG  --  1.6*  --    Liver Function Tests: Recent Labs  Lab 10/12/17 1640  AST 65*  ALT 41  ALKPHOS 61  BILITOT 0.8  PROT 5.6*  ALBUMIN 1.9*   CBC: Recent Labs  Lab 10/12/17 1640 10/13/17 0459 10/14/17 0533  WBC 1.7* 2.4* 2.9*  NEUTROABS 1.3*  --   --   HGB 8.0* 7.8* 7.9*  HCT 24.9* 23.7* 24.1*  MCV 70.4* 69.7* 70.1*  PLT 71* 71* 87*   Cardiac Enzymes: Recent Labs  Lab 10/12/17 1640  TROPONINI 1.22*   BNP (last  3 results) Recent Labs    09/04/17 1325 09/10/17 2113 10/12/17 1640  BNP 245.0* 137.0* 534.0*     CBG: Recent Labs  Lab 10/13/17 1723 10/13/17 2139 10/14/17 0739 10/14/17 1147 10/14/17 1625  GLUCAP 159* 202* 259* 278* 254*    Recent Results (from the past 240 hour(s))  Culture, blood (routine x 2)     Status: Abnormal (Preliminary result)   Collection Time: 10/12/17  5:05 PM  Result Value Ref Range Status   Specimen Description   Final    BLOOD RIGHT ANTECUBITAL Performed at [facility], [address], [place], [place] [address]    Special Requests   Final    BOTTLES DRAWN AEROBIC AND ANAEROBIC Blood Culture results may not be optimal due to an excessive volume of  blood received in culture bottles Performed at [facility], [address] [place], [place] [address]    Culture  Setup Time   Final    GRAM POSITIVE COCCI IN BOTH AEROBIC AND ANAEROBIC BOTTLES CRITICAL RESULT CALLED TO, READ BACK BY AND VERIFIED WITH: JASON ROBBINS AT 7494 ON 10/13/2017 JJB    Culture (A)  Final    STAPHYLOCOCCUS AUREUS SUSCEPTIBILITIES TO FOLLOW Performed at [facility], [place] [address], [place], [place] [address]    Report Status PENDING  Incomplete  Culture, blood (routine x 2)     Status: Abnormal (Preliminary result)   Collection Time: 10/12/17  5:05 PM  Result Value Ref Range Status   Specimen Description   Final    BLOOD RIGHT HAND Performed at [facility], [address] [place], [place] [address]    Special Requests   Final    BOTTLES DRAWN AEROBIC AND ANAEROBIC Blood Culture adequate volume Performed at [facility], [address] [place], [place] [address]    Culture  Setup Time   Final    GRAM POSITIVE COCCI IN BOTH AEROBIC AND ANAEROBIC BOTTLES CRITICAL RESULT CALLED TO, READ BACK BY AND VERIFIED WITH: JASON ROBBINS ATR 9357 ON 10/13/2017 JJB Performed at [facility], [address] [place], [place] [address]    Culture STAPHYLOCOCCUS AUREUS (A)  Final   Report Status PENDING  Incomplete  Blood Culture ID Panel (Reflexed)     Status: Abnormal   Collection Time: 10/12/17  5:05 PM  Result Value Ref Range Status   Enterococcus species NOT DETECTED NOT DETECTED Final   Listeria monocytogenes NOT DETECTED NOT DETECTED Final   Staphylococcus species DETECTED (A) NOT DETECTED Final    Comment: CRITICAL RESULT CALLED TO, READ BACK BY AND VERIFIED WITH: JASON ROBBINS AT 1514 ON 10/13/2017 JJB    Staphylococcus aureus DETECTED (A) NOT DETECTED Final    Comment: Methicillin (oxacillin)-resistant Staphylococcus aureus (MRSA). MRSA is predictably resistant to beta-lactam antibiotics (except ceftaroline).  Preferred therapy is vancomycin unless clinically contraindicated. Patient requires contact precautions if  hospitalized. CRITICAL RESULT CALLED TO, READ BACK BY AND VERIFIED WITH: JASON ROBBINS AT 3903 ON 10/13/2017 JJB    Methicillin resistance DETECTED (A) NOT DETECTED Final    Comment: CRITICAL RESULT CALLED TO, READ BACK BY AND VERIFIED WITH: JASON ROBBINS AT 0092 ON 10/13/2017 JJB    Streptococcus species NOT DETECTED NOT DETECTED Final   Streptococcus agalactiae NOT DETECTED NOT DETECTED Final   Streptococcus pneumoniae NOT DETECTED NOT DETECTED Final   Streptococcus pyogenes NOT DETECTED NOT DETECTED Final   Acinetobacter baumannii NOT DETECTED NOT DETECTED Final   Enterobacteriaceae species NOT DETECTED NOT DETECTED  Final   Enterobacter cloacae complex NOT DETECTED NOT DETECTED Final   Escherichia coli NOT DETECTED NOT DETECTED Final   Klebsiella oxytoca NOT DETECTED NOT DETECTED Final   Klebsiella pneumoniae NOT DETECTED NOT DETECTED Final   Proteus species NOT DETECTED NOT DETECTED Final   Serratia marcescens NOT DETECTED NOT DETECTED Final   Haemophilus influenzae NOT DETECTED NOT DETECTED Final   Neisseria meningitidis NOT DETECTED NOT DETECTED Final   Pseudomonas aeruginosa NOT DETECTED NOT DETECTED Final   Candida albicans NOT DETECTED NOT DETECTED Final   Candida glabrata NOT DETECTED NOT DETECTED Final   Candida krusei NOT DETECTED NOT DETECTED Final   Candida parapsilosis NOT DETECTED NOT DETECTED Final   Candida tropicalis NOT DETECTED NOT DETECTED Final    Comment: Performed at [facility], [address] [place], [place] [address]  MRSA PCR Screening     Status: Abnormal   Collection Time: 10/12/17 11:04 PM  Result Value Ref Range Status   MRSA by PCR POSITIVE (A) NEGATIVE Final    Comment:        The GeneXpert MRSA Assay (FDA approved for NASAL specimens only), is one component of a comprehensive MRSA colonization surveillance program. It is  not intended to diagnose MRSA infection nor to guide or monitor treatment for MRSA infections. RESULT CALLED TO, READ BACK BY AND VERIFIED WITH: TONI WALKER AT 0052 10/13/17.PMH Performed at [facility], [address] [place], [place] [address]   Culture, blood (single) w Reflex to ID Panel     Status: None (Preliminary result)   Collection Time: 10/13/17  4:01 PM  Result Value Ref Range Status   Specimen Description BLOOD Blood Culture adequate volume  Final   Special Requests BLOOD RIGHT HAND  Final   Culture   Final    NO GROWTH < 24 HOURS Performed at [facility], [address], [place], [place] [address]    Report Status PENDING  Incomplete     Studies: Dg Chest Port 1 View  Result Date: 10/14/2017 CLINICAL DATA:  Respiratory failure EXAM: PORTABLE CHEST 1 VIEW COMPARISON:  10/12/2017 FINDINGS: Left AICD remains in place, unchanged. Prior CABG. Cardiomegaly. Improving vascular congestion. Left perihilar and lower lobe atelectasis or infiltrate noted. No visible effusions or acute bony abnormality. IMPRESSION: Cardiomegaly.  Improving vascular congestion. Left perihilar and lower lobe atelectasis or infiltrate. Electronically Signed   By: [name] M.D.   On: 10/14/2017 07:48   Dg Chest Portable 1 View  Result Date: 10/12/2017 CLINICAL DATA:  Shortness of breath. EXAM: PORTABLE CHEST 1 VIEW COMPARISON:  Chest x-ray dated October 07, 2017. FINDINGS: Unchanged left chest wall AICD. Stable cardiomegaly. Mild central vascular congestion. Unchanged retrocardiac opacity. No large pleural effusion. No pneumothorax. No acute osseous abnormality. IMPRESSION: 1. Stable cardiomegaly and mild central vascular congestion. No overt pulmonary edema. 2. Unchanged left lower lobe pneumonia. Electronically Signed   By: Titus Dubin M.D.   On: 10/12/2017 17:54    Scheduled Meds: . acyclovir  400 mg Oral Daily  . atorvastatin  40 mg Oral QHS  . budesonide (PULMICORT)  nebulizer solution  0.25 mg Nebulization Q6H  . calcium-vitamin D  1 tablet Oral BID  . carvedilol  25 mg Oral BID WC  . docusate sodium  100 mg Oral BID  . fentaNYL  12.5 mcg Transdermal Q72H  . furosemide  80 mg Intravenous Daily  . heparin  5,000 Units Subcutaneous Q8H  . insulin aspart  0-20 Units Subcutaneous  TID WC  . insulin aspart  0-5 Units Subcutaneous QHS  . insulin detemir  3 Units Subcutaneous BID  . ipratropium-albuterol  3 mL Nebulization Q6H  . mupirocin ointment   Topical Daily  . oseltamivir  30 mg Oral Daily  . potassium chloride SA  20 mEq Oral BID  . tamsulosin  0.4 mg Oral Daily  . traZODone  50 mg Oral QHS  . vitamin B-12  1,000 mcg Oral Daily  . vitamin C  500 mg Oral Daily   Continuous Infusions: . sodium chloride      Assessment/Plan:  1. Clinical sepsis, healthcare acquired pneumonia.  Positive blood cultures with staph aureus with methicillin resistance.  Await final sensitivities.  Infectious disease consultation appreciated.  TEE for tomorrow.   The patient does have a defibrillator and cardiology at this point does not want to take this out.  Patient on IV vancomycin. 2. Acute hypoxic respiratory failure.  Patient off BiPAP and now on nasal cannula. 3. Influenza A positive on Tamiflu.  4. Acute kidney injury on chronic kidney disease stage III.  Continue to monitor creatinine closely while on vancomycin.  Creatinine went up to 3.13. 5. Chronic multiple myeloma with pancytopenia.  Last chemotherapy February 4th. 6. Chronic heel and sacral decubiti.  Local wound care 7. Hypomagnesemia replaced 8. Recent lumbar spine compression fracture with interventional radiology kyphoplasty on last hospitalization. 9. Anemia of chronic disease 10. Atrial fibrillation with rapid ventricular response on Coreg 11. BPH on Flomax 12. Type 2 diabetes mellitus on sliding scale and low-dose Levemir 13. Urinary retention Foley had a placed  Code Status:     Code  Status Orders  (From admission, onward)        Start     Ordered   10/12/17 2157  Full code  Continuous     10/12/17 2156    Code Status History    Date Active Date Inactive Code Status Order ID Comments User Context   09/11/2017 03:08 09/16/2017 20:28 Full Code [id number]  Amelia Jo, MD Inpatient   09/04/2017 17:55 09/08/2017 18:54 Full Code [id number]  [name], MD ED     Family Communication: wife at the bedside Disposition Plan: To be determined  Consultants:  Critical care specialist  Infectious disease  Antibiotics:  Vancomycin  Time spent: 25 minutes  [place]

## 2017-10-14 NOTE — Progress Notes (Signed)
Inpatient Diabetes Program Recommendations  AACE/ADA: New Consensus Statement on Inpatient Glycemic Control (2015)  Target Ranges:  Prepandial:   less than 140 mg/dL      Peak postprandial:   less than 180 mg/dL (1-2 hours)      Critically ill patients:  140 - 180 mg/dL   Lab Results  Component Value Date   BSJGGE 366 (H) 10/14/2017    Review of Glycemic Control  Results for JEFFRY, VOGELSANG (MRN [id number]) as of 10/14/2017 09:10  Ref. Range 10/13/2017 07:13 10/13/2017 11:08 10/13/2017 17:23 10/13/2017 21:39 10/14/2017 07:39  Glucose-Capillary Latest Ref Range: 65 - 99 mg/dL 250 (H) 239 (H) 159 (H) 202 (H) 259 (H)   Results for HERMINIO, KNISKERN (MRN [id number]) as of 10/14/2017 09:10  Ref. Range 10/14/2017 05:33  Creatinine Latest Ref Range: 0.61 - 1.24 mg/dL 3.13 (H)   Results for ANIAS, BARTOL (MRN [id number]) as of 10/14/2017 09:10  Ref. Range 10/14/2017 05:33  GFR, Est African American Latest Ref Range: >60 mL/min 21 (L)   Results for ERYN, KREJCI (MRN [id number]) as of 10/14/2017 09:10  Ref. Range 10/14/2017 05:33  BUN Latest Ref Range: 6 - 20 mg/dL 74 (H)    Diabetes history: Type 2 Outpatient Diabetes medications: Novolin 70/30 8 units bid Current orders for Inpatient glycemic control: Novolog 0-20 units tid, Novolog 0-5 units qhs  Inpatient Diabetes Program Recommendations: Because of patient's poor renal function, please decrease Novolog correction to sensitive 0-9 units tid , add low dose basal, Levemir 3 units bid.     If he is eating, add Novolog 2 units tid with meals (hold if he eats less than 50%)  Gentry Fitz, RN, [place], , CDE Diabetes Coordinator Inpatient Diabetes Program  [phone] (Team Pager) [phone] ([place]) 10/14/2017 9:16 AM

## 2017-10-14 NOTE — Progress Notes (Signed)
[facility] PROGRESS NOTE Date of Admission:  10/12/2017     ID: Adam Wall is a 72 y.o. 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 No fevers, denies pain. Mild cough but no sputum  ROS  Eleven systems are reviewed and negative except per hpi  Medications:  Antibiotics Given (last 72 hours)    Date/Time Action Medication Dose Rate   10/12/17 1856 New Bag/Given   ceFEPIme (MAXIPIME) 2 g in sodium chloride 0.9 % 100 mL IVPB 2 g 200 mL/hr   10/12/17 1942 New Bag/Given   vancomycin (VANCOCIN) IVPB 1000 mg/200 mL premix 1,000 mg 200 mL/hr   10/13/17 0130 New Bag/Given   vancomycin (VANCOCIN) IVPB 1000 mg/200 mL premix 1,000 mg 200 mL/hr   10/13/17 0540 New Bag/Given   ceFEPIme (MAXIPIME) 1 g in sodium chloride 0.9 % 100 mL IVPB 1 g 200 mL/hr   10/13/17 0942 Given   acyclovir (ZOVIRAX) 200 MG capsule 400 mg 400 mg    10/13/17 0942 Given   oseltamivir (TAMIFLU) capsule 30 mg 30 mg    10/13/17 2320 New Bag/Given   vancomycin (VANCOCIN) IVPB 1000 mg/200 mL premix 1,000 mg 200 mL/hr   10/14/17 1129 Given   oseltamivir (TAMIFLU) capsule 30 mg 30 mg    10/14/17 1130 Given   acyclovir (ZOVIRAX) 200 MG capsule 400 mg 400 mg      . acyclovir  400 mg Oral Daily  . atorvastatin  40 mg Oral QHS  . budesonide (PULMICORT) nebulizer solution  0.25 mg Nebulization Q6H  . calcium-vitamin D  1 tablet Oral BID  . carvedilol  25 mg Oral BID WC  . docusate sodium  100 mg Oral BID  . fentaNYL  12.5 mcg Transdermal Q72H  . furosemide  80 mg Intravenous Daily  . heparin  5,000 Units Subcutaneous Q8H  . insulin aspart  0-20 Units Subcutaneous TID WC  . insulin aspart  0-5 Units Subcutaneous QHS  . insulin detemir  3 Units Subcutaneous BID  . ipratropium-albuterol  3 mL Nebulization Q6H  . mupirocin ointment   Topical Daily  . oseltamivir  30 mg  Oral Daily  . potassium chloride SA  20 mEq Oral BID  . tamsulosin  0.4 mg Oral Daily  . traZODone  50 mg Oral QHS  . vitamin B-12  1,000 mcg Oral Daily  . vitamin C  500 mg Oral Daily    Objective: Vital signs in last 24 hours: Temp:  [97.5 F (36.4 C)-97.9 F (36.6 C)] 97.5 F (36.4 C) (02/19 0730) Pulse Rate:  [93-131] 115 (02/19 1400) Resp:  [12-22] 18 (02/19 1400) BP: (84-122)/(44-95) 93/44 (02/19 1400) SpO2:  [88 %-100 %] 96 % (02/19 1400) Constitutional: He is chronically ill appearing, somewhat confused HENT: anicteric, pale Mouth/Throat: Oropharynx is clear and dry . No oropharyngeal exudate.  Cardiovascular: Normal rate, regular rhythm and normal heart sounds. 2/6 sm AICD in place, non tender Pulmonary/Chest: poor air movement Abdominal: Soft. Bowel sounds are normal. He exhibits no distension. There is no tenderness.  Lymphadenopathy: He has no cervical adenopathy.  Neurological: He is awake and interactive Ext no cce Skin: bil heels with pressure injury and bruising but no open wounds or drainage Psychiatric: flat affect     Lab Results Recent Labs    10/13/17 0459 10/14/17 0533  WBC 2.4* 2.9*  HGB 7.8* 7.9*  HCT  23.7* 24.1*  NA 141 136  K 4.0 4.4  CL 113* 108  CO2 17* 17*  BUN 57* 74*  CREATININE 2.52* 3.13*    Microbiology: Results for orders placed or performed during the hospital encounter of 10/12/17  Culture, blood (routine x 2)     Status: Abnormal (Preliminary result)   Collection Time: 10/12/17  5:05 PM  Result Value Ref Range Status   Specimen Description   Final    BLOOD RIGHT ANTECUBITAL Performed at [facility], [address], [place], [place] [address]    Special Requests   Final    BOTTLES DRAWN AEROBIC AND ANAEROBIC Blood Culture results may not be optimal due to an excessive volume of blood received in culture bottles Performed at [facility], [address] [place], [place] [address]    Culture   Setup Time   Final    GRAM POSITIVE COCCI IN BOTH AEROBIC AND ANAEROBIC BOTTLES CRITICAL RESULT CALLED TO, READ BACK BY AND VERIFIED WITH: JASON ROBBINS AT 5400 ON 10/13/2017 JJB    Culture (A)  Final    STAPHYLOCOCCUS AUREUS SUSCEPTIBILITIES TO FOLLOW Performed at [facility], [place] [address] [place], [place] [address]    Report Status PENDING  Incomplete  Culture, blood (routine x 2)     Status: Abnormal (Preliminary result)   Collection Time: 10/12/17  5:05 PM  Result Value Ref Range Status   Specimen Description   Final    BLOOD RIGHT HAND Performed at [facility], [address], [place], [place] [address]    Special Requests   Final    BOTTLES DRAWN AEROBIC AND ANAEROBIC Blood Culture adequate volume Performed at [facility], [address] [place], [place] [address]    Culture  Setup Time   Final    GRAM POSITIVE COCCI IN BOTH AEROBIC AND ANAEROBIC BOTTLES CRITICAL RESULT CALLED TO, READ BACK BY AND VERIFIED WITH: JASON ROBBINS ATR 4580 ON 10/13/2017 JJB Performed at [facility], [address] [place], [place] [address]    Culture STAPHYLOCOCCUS AUREUS (A)  Final   Report Status PENDING  Incomplete  Blood Culture ID Panel (Reflexed)     Status: Abnormal   Collection Time: 10/12/17  5:05 PM  Result Value Ref Range Status   Enterococcus species NOT DETECTED NOT DETECTED Final   Listeria monocytogenes NOT DETECTED NOT DETECTED Final   Staphylococcus species DETECTED (A) NOT DETECTED Final    Comment: CRITICAL RESULT CALLED TO, READ BACK BY AND VERIFIED WITH: JASON ROBBINS AT 1514 ON 10/13/2017 JJB    Staphylococcus aureus DETECTED (A) NOT DETECTED Final    Comment: Methicillin (oxacillin)-resistant Staphylococcus aureus (MRSA). MRSA is predictably resistant to beta-lactam antibiotics (except ceftaroline). Preferred therapy is vancomycin unless clinically contraindicated. Patient requires contact precautions if   hospitalized. CRITICAL RESULT CALLED TO, READ BACK BY AND VERIFIED WITH: JASON ROBBINS AT 8250 ON 10/13/2017 JJB    Methicillin resistance DETECTED (A) NOT DETECTED Final    Comment: CRITICAL RESULT CALLED TO, READ BACK BY AND VERIFIED WITH: JASON ROBBINS AT 5397 ON 10/13/2017 JJB    Streptococcus species NOT DETECTED NOT DETECTED Final   Streptococcus agalactiae NOT DETECTED NOT DETECTED Final   Streptococcus pneumoniae NOT DETECTED NOT DETECTED Final   Streptococcus pyogenes NOT DETECTED NOT DETECTED Final   Acinetobacter baumannii NOT DETECTED NOT DETECTED Final   Enterobacteriaceae species NOT DETECTED NOT DETECTED Final   Enterobacter cloacae complex NOT DETECTED NOT DETECTED Final  Escherichia coli NOT DETECTED NOT DETECTED Final   Klebsiella oxytoca NOT DETECTED NOT DETECTED Final   Klebsiella pneumoniae NOT DETECTED NOT DETECTED Final   Proteus species NOT DETECTED NOT DETECTED Final   Serratia marcescens NOT DETECTED NOT DETECTED Final   Haemophilus influenzae NOT DETECTED NOT DETECTED Final   Neisseria meningitidis NOT DETECTED NOT DETECTED Final   Pseudomonas aeruginosa NOT DETECTED NOT DETECTED Final   Candida albicans NOT DETECTED NOT DETECTED Final   Candida glabrata NOT DETECTED NOT DETECTED Final   Candida krusei NOT DETECTED NOT DETECTED Final   Candida parapsilosis NOT DETECTED NOT DETECTED Final   Candida tropicalis NOT DETECTED NOT DETECTED Final    Comment: Performed at [facility], [address] [place], [place] [address]  MRSA PCR Screening     Status: Abnormal   Collection Time: 10/12/17 11:04 PM  Result Value Ref Range Status   MRSA by PCR POSITIVE (A) NEGATIVE Final    Comment:        The GeneXpert MRSA Assay (FDA approved for NASAL specimens only), is one component of a comprehensive MRSA colonization surveillance program. It is not intended to diagnose MRSA infection nor to guide or monitor treatment for MRSA infections. RESULT  CALLED TO, READ BACK BY AND VERIFIED WITH: TONI WALKER AT 0052 10/13/17.PMH Performed at [facility], [address] [place], [place] [address]   Culture, blood (single) w Reflex to ID Panel     Status: None (Preliminary result)   Collection Time: 10/13/17  4:01 PM  Result Value Ref Range Status   Specimen Description BLOOD Blood Culture adequate volume  Final   Special Requests BLOOD RIGHT HAND  Final   Culture   Final    NO GROWTH < 24 HOURS Performed at [facility], [address], [place], [place] [address]    Report Status PENDING  Incomplete    Studies/Results: Dg Chest Port 1 View  Result Date: 10/14/2017 CLINICAL DATA:  Respiratory failure EXAM: PORTABLE CHEST 1 VIEW COMPARISON:  10/12/2017 FINDINGS: Left AICD remains in place, unchanged. Prior CABG. Cardiomegaly. Improving vascular congestion. Left perihilar and lower lobe atelectasis or infiltrate noted. No visible effusions or acute bony abnormality. IMPRESSION: Cardiomegaly.  Improving vascular congestion. Left perihilar and lower lobe atelectasis or infiltrate. Electronically Signed   By: [name] M.D.   On: 10/14/2017 07:48   Dg Chest Portable 1 View  Result Date: 10/12/2017 CLINICAL DATA:  Shortness of breath. EXAM: PORTABLE CHEST 1 VIEW COMPARISON:  Chest x-ray dated October 07, 2017. FINDINGS: Unchanged left chest wall AICD. Stable cardiomegaly. Mild central vascular congestion. Unchanged retrocardiac opacity. No large pleural effusion. No pneumothorax. No acute osseous abnormality. IMPRESSION: 1. Stable cardiomegaly and mild central vascular congestion. No overt pulmonary edema. 2. Unchanged left lower lobe pneumonia. Electronically Signed   By: Titus Dubin M.D.   On: 10/12/2017 17:54    Assessment/Plan: Adam Wall is a 72 y.o. male with myeloma, on chemo admitted with increasing sob and found to have LLL infiltrate, influenza and MRSA bacteremia.  Has sacral and heel ulcers with  are potential sources of MRSA bacteremia infection.  Course complicated by cytopenias and ARF.  He will need evaluation for endocarditis as well as evaluation by EP for removal of the AICD which is likely seeded.  2/19 - no fevers, Echo neg. [place] 2/18 fu ngtd. Seen by cardiology.  Day 3 vanco, day 2 tamiflu  Recommendations Continue vanco - monitor cr closely and can  consider change to daptomycin but this will not treat possible PNA.  Cont tamiflu x 5 days.   For TEE tomorrow Discussed that will likely plan on leaving ICD in place and suppressing after a 6 week course of IV abx Discussed with patient, wife, Dr Ubaldo Glassing and Dr Earleen Newport.   Thank you very much for the consult. Will follow with you.  Adam Wall   10/14/2017, 3:01 PM

## 2017-10-14 NOTE — Progress Notes (Signed)
Pt transferred from CCU. No c/o pain, no SOB. Pt oriented top equipment and staff. Pt pleasant, alert and oriented at the time of transfer. Pt became confused around 2230. Pt states that no one would help him and he was leaving. Pt alert to self but confused to where he had been moved to and what was his situation. Pt reoriented to situation and his wife updated on pt current state and location in the facility. Pt is lying in bed eating a snack with no concerns offered at this time.

## 2017-10-15 ENCOUNTER — Encounter
Admission: EM | Disposition: A | Discharge status: Hospice | Payer: Self-pay | Source: Home / Self Care | Attending: Internal Medicine

## 2017-10-15 ENCOUNTER — Telehealth: Payer: Self-pay | Admitting: *Deleted

## 2017-10-15 ENCOUNTER — Encounter: Payer: Self-pay | Admitting: *Deleted

## 2017-10-15 ENCOUNTER — Inpatient Hospital Stay: Admit: 2017-10-15 | Payer: Medicare Other

## 2017-10-15 HISTORY — PX: TEE WITHOUT CARDIOVERSION: SHX5443

## 2017-10-15 LAB — COMPREHENSIVE METABOLIC PANEL
ALBUMIN: 1.8 g/dL — AB (ref 3.5–5.0)
ALK PHOS: 63 U/L (ref 38–126)
ALT: 32 U/L (ref 17–63)
AST: 38 U/L (ref 15–41)
Anion gap: 11 (ref 5–15)
BILIRUBIN TOTAL: 0.8 mg/dL (ref 0.3–1.2)
BUN: 85 mg/dL — ABNORMAL HIGH (ref 6–20)
CALCIUM: 6.7 mg/dL — AB (ref 8.9–10.3)
CO2: 17 mmol/L — ABNORMAL LOW (ref 22–32)
Chloride: 107 mmol/L (ref 101–111)
Creatinine, Ser: 3.55 mg/dL — ABNORMAL HIGH (ref 0.61–1.24)
GFR calc non Af Amer: 16 mL/min — ABNORMAL LOW (ref >60)
GFR, EST AFRICAN AMERICAN: 18 mL/min — AB (ref >60)
Glucose, Bld: 209 mg/dL — ABNORMAL HIGH (ref 65–99)
POTASSIUM: 5.1 mmol/L (ref 3.5–5.1)
Sodium: 135 mmol/L (ref 135–145)
TOTAL PROTEIN: 5.2 g/dL — AB (ref 6.5–8.1)

## 2017-10-15 LAB — CBC
HEMATOCRIT: 24.1 % — AB (ref 40.0–52.0)
HEMOGLOBIN: 8 g/dL — AB (ref 13.0–18.0)
MCH: 23 pg — AB (ref 26.0–34.0)
MCHC: 33 g/dL (ref 32.0–36.0)
MCV: 69.6 fL — ABNORMAL LOW (ref 80.0–100.0)
Platelets: 107 10*3/uL — ABNORMAL LOW (ref 150–440)
RBC: 3.47 MIL/uL — ABNORMAL LOW (ref 4.40–5.90)
RDW: 20.3 % — AB (ref 11.5–14.5)
WBC: 3.2 10*3/uL — ABNORMAL LOW (ref 3.8–10.6)

## 2017-10-15 LAB — CULTURE, BLOOD (ROUTINE X 2): Special Requests: ADEQUATE

## 2017-10-15 LAB — VANCOMYCIN, TROUGH: VANCOMYCIN TR: 21 ug/mL — AB (ref 15–20)

## 2017-10-15 LAB — GLUCOSE, CAPILLARY
GLUCOSE-CAPILLARY: 202 mg/dL — AB (ref 65–99)
GLUCOSE-CAPILLARY: 211 mg/dL — AB (ref 65–99)
GLUCOSE-CAPILLARY: 195 mg/dL — AB (ref 65–99)
Glucose-Capillary: 243 mg/dL — ABNORMAL HIGH (ref 65–99)
Glucose-Capillary: 165 mg/dL — ABNORMAL HIGH (ref 65–99)
Glucose-Capillary: 99 mg/dL (ref 65–99)

## 2017-10-15 LAB — VANCOMYCIN, RANDOM: VANCOMYCIN RM: 22

## 2017-10-15 LAB — CK: Total CK: 263 U/L (ref 49–397)

## 2017-10-15 SURGERY — ECHOCARDIOGRAM, TRANSESOPHAGEAL
Anesthesia: Moderate Sedation

## 2017-10-15 MED ORDER — SODIUM CHLORIDE 0.9 % IV SOLN: 6.0000 mg/kg | INTRAVENOUS | Status: DC

## 2017-10-15 MED ORDER — BUTAMBEN-TETRACAINE-BENZOCAINE 2-2-14 % EX AERO
INHALATION_SPRAY | CUTANEOUS | Status: AC
  Filled 2017-10-15: qty 5

## 2017-10-15 MED ORDER — SODIUM CHLORIDE FLUSH 0.9 % IV SOLN
INTRAVENOUS | Status: AC
  Administered 2017-10-15: 3 mL
  Filled 2017-10-15: qty 10

## 2017-10-15 MED ORDER — RIFAMPIN 300 MG PO CAPS
300.0000 mg | ORAL_CAPSULE | Freq: Two times a day (BID) | ORAL | Status: DC
Start: 2017-10-15 — End: 2017-10-17
  Administered 2017-10-15: 300 mg via ORAL
  Filled 2017-10-15 (×5): qty 1

## 2017-10-15 MED ORDER — LIDOCAINE VISCOUS 2 % MT SOLN
OROMUCOSAL | Status: AC
  Filled 2017-10-15: qty 15

## 2017-10-15 MED ORDER — FENTANYL CITRATE (PF) 100 MCG/2ML IJ SOLN
INTRAMUSCULAR | Status: AC
  Filled 2017-10-15: qty 2

## 2017-10-15 MED ORDER — MIDAZOLAM HCL 5 MG/5ML IJ SOLN
INTRAMUSCULAR | Status: AC
  Filled 2017-10-15: qty 5

## 2017-10-15 MED ORDER — BENZONATATE 100 MG PO CAPS: 100.0000 mg | ORAL_CAPSULE | Freq: Two times a day (BID) | ORAL | Status: DC | PRN

## 2017-10-15 MED ORDER — SODIUM CHLORIDE 0.9 % IV SOLN: INTRAVENOUS | Status: DC

## 2017-10-15 NOTE — Care Management (Signed)
Patient moved out of icu today.  He is from [facility] and being treated for MRSA bacteremia possibly due to aicd.  Patient was to have TEE today but he refused it when transported to specials for procedure.  Patient is also undergoing chem for multiple myeloma through the [facility] cancer center.

## 2017-10-15 NOTE — Progress Notes (Addendum)
ANTIBIOTIC CONSULT NOTE - INITIAL  Pharmacy Consult for daptomycin Indication: MRSA bacteremia  No Known Allergies  Patient Measurements: Height: 6' (182.9 cm) Weight: 201 lb 9.6 oz (91.4 kg) IBW/kg (Calculated) : 77.6 Adjusted Body Weight:   Vital Signs: Temp: 98 F (36.7 C) (02/20 0436) Temp Source: Oral (02/20 0436) BP: 116/64 (02/20 0436) Pulse Rate: 114 (02/20 0743) Intake/Output from previous day: 02/19 0701 - 02/20 0700 In: -  Out: 870 [Urine:870] Intake/Output from this shift: Total I/O In: 3 [I.V.:3] Out: -   Labs: Recent Labs    10/13/17 0459 10/14/17 0533 10/15/17 0710  WBC 2.4* 2.9* 3.2*  HGB 7.8* 7.9* 8.0*  PLT 71* 87* 107*  CREATININE 2.52* 3.13* 3.55*   Estimated Creatinine Clearance: 20.3 mL/min (A) (by C-G formula based on SCr of 3.55 mg/dL (H)). Recent Labs    10/14/17 2241 10/15/17 0710  VANCORANDOM 20 22     Microbiology: Recent Results (from the past 720 hour(s))  Culture, blood (routine x 2)     Status: Abnormal   Collection Time: 10/12/17  5:05 PM  Result Value Ref Range Status   Specimen Description   Final    BLOOD RIGHT ANTECUBITAL Performed at [facility], [address], [place], [place] [address]    Special Requests   Final    BOTTLES DRAWN AEROBIC AND ANAEROBIC Blood Culture results may not be optimal due to an excessive volume of blood received in culture bottles Performed at [facility], [address], [place], [place] [address]    Culture  Setup Time   Final    GRAM POSITIVE COCCI IN BOTH AEROBIC AND ANAEROBIC BOTTLES CRITICAL RESULT CALLED TO, READ BACK BY AND VERIFIED WITH: JASON ROBBINS AT 2423 ON 10/13/2017 [place] Performed at [facility], [place] [address] [place], [place] [address]    Culture METHICILLIN RESISTANT STAPHYLOCOCCUS AUREUS (A)  Final   Report Status 10/15/2017 FINAL  Final   Organism ID, Bacteria METHICILLIN RESISTANT STAPHYLOCOCCUS AUREUS  Final      Susceptibility   Methicillin resistant staphylococcus aureus - MIC*    CIPROFLOXACIN >=8 RESISTANT Resistant     ERYTHROMYCIN >=8 RESISTANT Resistant     GENTAMICIN <=0.5 SENSITIVE Sensitive     OXACILLIN >=4 RESISTANT Resistant     TETRACYCLINE <=1 SENSITIVE Sensitive     VANCOMYCIN 1 SENSITIVE Sensitive     TRIMETH/SULFA <=10 SENSITIVE Sensitive     CLINDAMYCIN >=8 RESISTANT Resistant     RIFAMPIN <=0.5 SENSITIVE Sensitive     Inducible Clindamycin NEGATIVE Sensitive     * METHICILLIN RESISTANT STAPHYLOCOCCUS AUREUS  Culture, blood (routine x 2)     Status: Abnormal   Collection Time: 10/12/17  5:05 PM  Result Value Ref Range Status   Specimen Description   Final    BLOOD RIGHT HAND Performed at [facility], [address], [place], [place] [address]    Special Requests   Final    BOTTLES DRAWN AEROBIC AND ANAEROBIC Blood Culture adequate volume Performed at [facility], [address] [place], [place] [address]    Culture  Setup Time   Final    GRAM POSITIVE COCCI IN BOTH AEROBIC AND ANAEROBIC BOTTLES CRITICAL RESULT CALLED TO, READ BACK BY AND VERIFIED WITH: JASON ROBBINS ATR 1514 ON 10/13/2017 JJB Performed at [facility], [address] [place], [place] [address]    Culture (A)  Final    STAPHYLOCOCCUS AUREUS SUSCEPTIBILITIES PERFORMED ON PREVIOUS CULTURE WITHIN THE  LAST 5 DAYS. Performed at [facility], [place] [address], [place], [place] [address]    Report Status 10/15/2017 FINAL  Final  Blood Culture ID Panel (Reflexed)     Status: Abnormal   Collection Time: 10/12/17  5:05 PM  Result Value Ref Range Status   Enterococcus species NOT DETECTED NOT DETECTED Final   Listeria monocytogenes NOT DETECTED NOT DETECTED Final   Staphylococcus species DETECTED (A) NOT DETECTED Final    Comment: CRITICAL RESULT CALLED TO, READ BACK BY AND VERIFIED WITH: JASON ROBBINS AT 1514 ON 10/13/2017 JJB    Staphylococcus aureus DETECTED (A) NOT DETECTED Final     Comment: Methicillin (oxacillin)-resistant Staphylococcus aureus (MRSA). MRSA is predictably resistant to beta-lactam antibiotics (except ceftaroline). Preferred therapy is vancomycin unless clinically contraindicated. Patient requires contact precautions if  hospitalized. CRITICAL RESULT CALLED TO, READ BACK BY AND VERIFIED WITH: JASON ROBBINS AT 6578 ON 10/13/2017 JJB    Methicillin resistance DETECTED (A) NOT DETECTED Final    Comment: CRITICAL RESULT CALLED TO, READ BACK BY AND VERIFIED WITH: JASON ROBBINS AT 4696 ON 10/13/2017 JJB    Streptococcus species NOT DETECTED NOT DETECTED Final   Streptococcus agalactiae NOT DETECTED NOT DETECTED Final   Streptococcus pneumoniae NOT DETECTED NOT DETECTED Final   Streptococcus pyogenes NOT DETECTED NOT DETECTED Final   Acinetobacter baumannii NOT DETECTED NOT DETECTED Final   Enterobacteriaceae species NOT DETECTED NOT DETECTED Final   Enterobacter cloacae complex NOT DETECTED NOT DETECTED Final   Escherichia coli NOT DETECTED NOT DETECTED Final   Klebsiella oxytoca NOT DETECTED NOT DETECTED Final   Klebsiella pneumoniae NOT DETECTED NOT DETECTED Final   Proteus species NOT DETECTED NOT DETECTED Final   Serratia marcescens NOT DETECTED NOT DETECTED Final   Haemophilus influenzae NOT DETECTED NOT DETECTED Final   Neisseria meningitidis NOT DETECTED NOT DETECTED Final   Pseudomonas aeruginosa NOT DETECTED NOT DETECTED Final   Candida albicans NOT DETECTED NOT DETECTED Final   Candida glabrata NOT DETECTED NOT DETECTED Final   Candida krusei NOT DETECTED NOT DETECTED Final   Candida parapsilosis NOT DETECTED NOT DETECTED Final   Candida tropicalis NOT DETECTED NOT DETECTED Final    Comment: Performed at [facility], [address] [place], [place] [address]  MRSA PCR Screening     Status: Abnormal   Collection Time: 10/12/17 11:04 PM  Result Value Ref Range Status   MRSA by PCR POSITIVE (A) NEGATIVE Final    Comment:        The  GeneXpert MRSA Assay (FDA approved for NASAL specimens only), is one component of a comprehensive MRSA colonization surveillance program. It is not intended to diagnose MRSA infection nor to guide or monitor treatment for MRSA infections. RESULT CALLED TO, READ BACK BY AND VERIFIED WITH: TONI WALKER AT 0052 10/13/17.PMH Performed at [facility], [address] [place], [place] [address]   Culture, blood (single) w Reflex to ID Panel     Status: None (Preliminary result)   Collection Time: 10/13/17  4:01 PM  Result Value Ref Range Status   Specimen Description BLOOD Blood Culture adequate volume  Final   Special Requests BLOOD RIGHT HAND  Final   Culture   Final    NO GROWTH 2 DAYS Performed at [facility], [address], [place], [place] [address]    Report Status PENDING  Incomplete    Medical History: Past Medical History:  Diagnosis Date  . AICD (automatic cardioverter/defibrillator) present   .  Anemia   . 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 Cancer ([place])   . CHF (congestive heart failure) ([place])   . Chronic kidney disease 05/22/2017   nephrectomy-right  . Diabetes mellitus without complication ([place])   . Dysrhythmia   . Heart murmur   . Hypertension   . Hypochloremia   . LBBB (left bundle branch block)   . Neuropathy   . Presence of permanent cardiac pacemaker   . Right renal mass   . 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 sodium chloride    . DAPTOmycin (CUBICIN)  IV     Assessment: 56 yom with documented MRSA bacteremia. SCr increasing and now ID consults to start daptomycin once vancomycin level subtherapeutic (<15 mcg/mL).   Goal of Therapy:  Resolve infection Prevent ADE  Plan:  1. Vancomycin discontinued by IDP. Consult says start dapto once vanc subtherapeutic so I ordered a random level for this evening at 19:00. 2. Atorvastatin has been held d/t DDI  3. Check baseline CK and at least weekly  while on daptomycin 4. Once vancomycin level subtherapeutic start daptomycin 6 mg/kg Q48H (decreased for CrCl less than 30 mL/min).   10/15/17 1821 vancomycin random 21 mcg/mL. Will not start daptomycin at this point and will recheck vancomycin random tomorrow evening.   Laural Benes, Pharm.D., BCPS Clinical Pharmacist 10/15/2017,3:11 PM

## 2017-10-15 NOTE — Progress Notes (Signed)
[facility], [place] 10/15/17  Subjective:   Patient known to our practice from previous admission He his known to have a IgG kappa multiple myeloma and is undergoing chemotherapy with Velcade at [facility] He also has history of renal cell cancer and is status post right nephrectomy He was hospitalized in January for lumbar spine compression fracture, symptomatic anemia, acute cystitis and acute renal failure.  He presented to the emergency room [facility] due to respiratory distress.  Upon arrival, his sats were 88% on nonrebreather and he was placed on CPAP.  He was admitted for treatment of left lower lobe, congestive heart failure exacerbation pneumonia  MRSA bacteremia He tested positive for influenza A  Hospital course is complicated by acute respiratory failure requiring continuous BiPAP He also has a stage III pressure ulcer injury to coccyx and bilateral upper buttocks  Patient is confused at present and is not able to provide any meaningful informatioin  Objective:  Vital signs in last 24 hours:  Temp:  [97.3 F (36.3 C)-98 F (36.7 C)] 98 F (36.7 C) (02/20 0436) Pulse Rate:  [83-129] 114 (02/20 0743) Resp:  [14-22] 16 (02/20 0743) BP: (83-132)/(44-99) 116/64 (02/20 0436) SpO2:  [88 %-100 %] 97 % (02/20 0743) Weight:  [91 kg (200 lb 9.6 oz)-91.4 kg (201 lb 9.6 oz)] 91.4 kg (201 lb 9.6 oz) (02/20 0500)  Weight change:  Filed Weights   10/13/17 0134 10/14/17 2143 10/15/17 0500  Weight: 87.8 kg (193 lb 9 oz) 91 kg (200 lb 9.6 oz) 91.4 kg (201 lb 9.6 oz)    Intake/Output:    Intake/Output Summary (Last 24 hours) at 10/15/2017 1113 Last data filed at 10/15/2017 1019 Gross per 24 hour  Intake 3 ml  Output 820 ml  Net -817 ml     Physical Exam: General: NAD, laying in bed  HEENT Anicteric, moist oral mucus membranes  Neck supple  Pulm/lungs Normal breathing effort  CVS/Heart No rub , CABG scar, AICD in place  left chest   Abdomen:  Soft, non tender  Extremities: Trace edema, feet in soft boot support  Neurologic: Alert, oriented only to self, delirious  Skin:  feet in soft support          Basic Metabolic Panel:  Recent Labs  Lab 10/12/17 1640 10/13/17 0459 10/14/17 0533 10/15/17 0710  NA 141 141 136 135  K 4.1 4.0 4.4 5.1  CL 113* 113* 108 107  CO2 20* 17* 17* 17*  GLUCOSE 196* 262* 256* 209*  BUN 53* 57* 74* 85*  CREATININE 2.54* 2.52* 3.13* 3.55*  CALCIUM 7.0* 6.9* 6.6* 6.7*  MG  --  1.6*  --   --      CBC: Recent Labs  Lab 10/12/17 1640 10/13/17 0459 10/14/17 0533 10/15/17 0710  WBC 1.7* 2.4* 2.9* 3.2*  NEUTROABS 1.3*  --   --   --   HGB 8.0* 7.8* 7.9* 8.0*  HCT 24.9* 23.7* 24.1* 24.1*  MCV 70.4* 69.7* 70.1* 69.6*  PLT 71* 71* 87* 107*      Lab Results  Component Value Date   HEPBSAG Negative 09/05/2017   HEPBSAB Non Reactive 09/05/2017   HEPBIGM Negative 09/05/2017      Microbiology:  Recent Results (from the past 240 hour(s))  Culture, blood (routine x 2)     Status: Abnormal   Collection Time: 10/12/17  5:05 PM  Result Value Ref Range Status   Specimen Description   Final  BLOOD RIGHT ANTECUBITAL Performed at [facility], [address] [place], [place] [address]    Special Requests   Final    BOTTLES DRAWN AEROBIC AND ANAEROBIC Blood Culture results may not be optimal due to an excessive volume of blood received in culture bottles Performed at [facility], [address], [place], [place] [address]    Culture  Setup Time   Final    GRAM POSITIVE COCCI IN BOTH AEROBIC AND ANAEROBIC BOTTLES CRITICAL RESULT CALLED TO, READ BACK BY AND VERIFIED WITH: JASON ROBBINS AT 9628 ON 10/13/2017 JJB Performed at [facility], [place] [address], [place], [place] [address]    Culture METHICILLIN RESISTANT STAPHYLOCOCCUS AUREUS (A)  Final   Report Status 10/15/2017 FINAL  Final   Organism ID, Bacteria METHICILLIN RESISTANT  STAPHYLOCOCCUS AUREUS  Final      Susceptibility   Methicillin resistant staphylococcus aureus - MIC*    CIPROFLOXACIN >=8 RESISTANT Resistant     ERYTHROMYCIN >=8 RESISTANT Resistant     GENTAMICIN <=0.5 SENSITIVE Sensitive     OXACILLIN >=4 RESISTANT Resistant     TETRACYCLINE <=1 SENSITIVE Sensitive     VANCOMYCIN 1 SENSITIVE Sensitive     TRIMETH/SULFA <=10 SENSITIVE Sensitive     CLINDAMYCIN >=8 RESISTANT Resistant     RIFAMPIN <=0.5 SENSITIVE Sensitive     Inducible Clindamycin NEGATIVE Sensitive     * METHICILLIN RESISTANT STAPHYLOCOCCUS AUREUS  Culture, blood (routine x 2)     Status: Abnormal   Collection Time: 10/12/17  5:05 PM  Result Value Ref Range Status   Specimen Description   Final    BLOOD RIGHT HAND Performed at [facility], [address] [place], [place] [address]    Special Requests   Final    BOTTLES DRAWN AEROBIC AND ANAEROBIC Blood Culture adequate volume Performed at [facility], [address] [place], [place] [address]    Culture  Setup Time   Final    GRAM POSITIVE COCCI IN BOTH AEROBIC AND ANAEROBIC BOTTLES CRITICAL RESULT CALLED TO, READ BACK BY AND VERIFIED WITH: JASON ROBBINS ATR 1514 ON 10/13/2017 JJB Performed at [facility], [address] [place], [place] [address]    Culture (A)  Final    STAPHYLOCOCCUS AUREUS SUSCEPTIBILITIES PERFORMED ON PREVIOUS CULTURE WITHIN THE LAST 5 DAYS. Performed at [facility], [place] [address], [place], [place] [address]    Report Status 10/15/2017 FINAL  Final  Blood Culture ID Panel (Reflexed)     Status: Abnormal   Collection Time: 10/12/17  5:05 PM  Result Value Ref Range Status   Enterococcus species NOT DETECTED NOT DETECTED Final   Listeria monocytogenes NOT DETECTED NOT DETECTED Final   Staphylococcus species DETECTED (A) NOT DETECTED Final    Comment: CRITICAL RESULT CALLED TO, READ BACK BY AND VERIFIED WITH: JASON ROBBINS AT 1514 ON 10/13/2017 JJB     Staphylococcus aureus DETECTED (A) NOT DETECTED Final    Comment: Methicillin (oxacillin)-resistant Staphylococcus aureus (MRSA). MRSA is predictably resistant to beta-lactam antibiotics (except ceftaroline). Preferred therapy is vancomycin unless clinically contraindicated. Patient requires contact precautions if  hospitalized. CRITICAL RESULT CALLED TO, READ BACK BY AND VERIFIED WITH: JASON ROBBINS AT 7001 ON 10/13/2017 JJB    Methicillin resistance DETECTED (A) NOT DETECTED Final    Comment: CRITICAL RESULT CALLED TO, READ BACK BY AND VERIFIED WITH: JASON ROBBINS AT 7494 ON 10/13/2017 JJB    Streptococcus species NOT DETECTED NOT DETECTED Final  Streptococcus agalactiae NOT DETECTED NOT DETECTED Final   Streptococcus pneumoniae NOT DETECTED NOT DETECTED Final   Streptococcus pyogenes NOT DETECTED NOT DETECTED Final   Acinetobacter baumannii NOT DETECTED NOT DETECTED Final   Enterobacteriaceae species NOT DETECTED NOT DETECTED Final   Enterobacter cloacae complex NOT DETECTED NOT DETECTED Final   Escherichia coli NOT DETECTED NOT DETECTED Final   Klebsiella oxytoca NOT DETECTED NOT DETECTED Final   Klebsiella pneumoniae NOT DETECTED NOT DETECTED Final   Proteus species NOT DETECTED NOT DETECTED Final   Serratia marcescens NOT DETECTED NOT DETECTED Final   Haemophilus influenzae NOT DETECTED NOT DETECTED Final   Neisseria meningitidis NOT DETECTED NOT DETECTED Final   Pseudomonas aeruginosa NOT DETECTED NOT DETECTED Final   Candida albicans NOT DETECTED NOT DETECTED Final   Candida glabrata NOT DETECTED NOT DETECTED Final   Candida krusei NOT DETECTED NOT DETECTED Final   Candida parapsilosis NOT DETECTED NOT DETECTED Final   Candida tropicalis NOT DETECTED NOT DETECTED Final    Comment: Performed at [facility], [address] [place], [place] [address]  MRSA PCR Screening     Status: Abnormal   Collection Time: 10/12/17 11:04 PM  Result Value Ref Range Status   MRSA by  PCR POSITIVE (A) NEGATIVE Final    Comment:        The GeneXpert MRSA Assay (FDA approved for NASAL specimens only), is one component of a comprehensive MRSA colonization surveillance program. It is not intended to diagnose MRSA infection nor to guide or monitor treatment for MRSA infections. RESULT CALLED TO, READ BACK BY AND VERIFIED WITH: TONI WALKER AT 0052 10/13/17.PMH Performed at [facility], [address] [place], [place] [address]   Culture, blood (single) w Reflex to ID Panel     Status: None (Preliminary result)   Collection Time: 10/13/17  4:01 PM  Result Value Ref Range Status   Specimen Description BLOOD Blood Culture adequate volume  Final   Special Requests BLOOD RIGHT HAND  Final   Culture   Final    NO GROWTH 2 DAYS Performed at [facility], [address] [place], [place] [address]    Report Status PENDING  Incomplete    Coagulation Studies: No results for input(s): LABPROT, INR in the last 72 hours.  Urinalysis: Recent Labs    10/14/17 1243  COLORURINE YELLOW*  LABSPEC 1.011  PHURINE 7.0  GLUCOSEU NEGATIVE  HGBUR MODERATE*  BILIRUBINUR NEGATIVE  KETONESUR NEGATIVE  PROTEINUR 100*  NITRITE NEGATIVE  LEUKOCYTESUR LARGE*      Imaging: Dg Chest Port 1 View  Result Date: 10/14/2017 CLINICAL DATA:  Respiratory failure EXAM: PORTABLE CHEST 1 VIEW COMPARISON:  10/12/2017 FINDINGS: Left AICD remains in place, unchanged. Prior CABG. Cardiomegaly. Improving vascular congestion. Left perihilar and lower lobe atelectasis or infiltrate noted. No visible effusions or acute bony abnormality. IMPRESSION: Cardiomegaly.  Improving vascular congestion. Left perihilar and lower lobe atelectasis or infiltrate. Electronically Signed   By: [name] M.D.   On: 10/14/2017 07:48     Medications:   . sodium chloride     . acyclovir  400 mg Oral Daily  . atorvastatin  40 mg Oral QHS  . budesonide (PULMICORT) nebulizer solution  0.25 mg  Nebulization Q6H  . butamben-tetracaine-benzocaine      . calcium-vitamin D  1 tablet Oral BID  . carvedilol  25 mg Oral BID WC  . docusate sodium  100 mg Oral BID  . fentaNYL  12.5 mcg Transdermal  Q72H  . furosemide  80 mg Intravenous Daily  . heparin  5,000 Units Subcutaneous Q8H  . insulin aspart  0-20 Units Subcutaneous TID WC  . insulin aspart  0-5 Units Subcutaneous QHS  . insulin detemir  3 Units Subcutaneous BID  . ipratropium-albuterol  3 mL Nebulization Q6H  . lidocaine      . mupirocin ointment   Topical Daily  . oseltamivir  30 mg Oral Daily  . potassium chloride SA  20 mEq Oral BID  . tamsulosin  0.4 mg Oral Daily  . traZODone  50 mg Oral QHS  . vitamin B-12  1,000 mcg Oral Daily  . vitamin C  500 mg Oral Daily   sodium chloride, acetaminophen, hydrALAZINE, loperamide, ondansetron (ZOFRAN) IV, oxyCODONE  Assessment/ Plan:  72 y.o. male with multiple myeloma, right nephrectomy secondary to renal cell carcinoma, diastolic congestive heart failure, anemia/thalessemia trait, AICD placement, diabetes mellitus type II, coronary artery disease status post CABG, who was admitted to [facility] with Acure resp failure Hospital course is complicated requirement for BiPAP, MRSA bacteremia, influenza A positive, acute renal failure, pneumonia  1.  Acute renal failure  2.  Chronic kidney disease stage III, baseline creatinine 1.64, GFR 46 on September 29, 2017 3.  MRSA sepsis 4.  Influenza A positive  ARF is likely secondary to sepsis and concurrent illness leading to ATN Electrolytes and Volume status are acceptable No acute indication for Dialysis at present Renal ultrasound from Jan 12 shows rt nephrectomy and left kidney unremarkable    LOS: [place] 2/20/201911:13 AM  [place], [place]  Note: This note was prepared with Dragon dictation. Any transcription errors are unintentional

## 2017-10-15 NOTE — Consult Note (Signed)
Pharmacy Antibiotic Note  Adam Wall is a 72 y.o. male admitted on 10/12/2017 with bacteremia.  Pharmacy has been consulted for Vancomycin dosing.  Patient ordered vancomycin 1g IV every 24 hours. Patient has received 3 doses to date.   2/20 0710 vancomycin level increased to 22 mcg/mL but patient didn't receive any additional doses. SCr continues to rise. Will recheck vanc random in 12 hours and redose from 2 levels.  2/19 PM vanc level 20. Will recheck with AM labs. Redose if AM level <20.    2/17 BCID: MRSA  Plan: Serum creatinine levels continue to rise (2.52>>3.13). Hold vancomycin and dose based on follow up vanc levels.  First random level ordered for 2/19 @ 2300.  Pharmacy will continue to monitor and adjust per consult.  Height: 6' (182.9 cm) Weight: 201 lb 9.6 oz (91.4 kg) IBW/kg (Calculated) : 77.6  Temp (24hrs), Avg:97.6 F (36.4 C), Min:97.3 F (36.3 C), Max:98 F (36.7 C)  Recent Labs  Lab 10/12/17 1640 10/13/17 0459 10/14/17 0533 10/14/17 2241 10/15/17 0710  WBC 1.7* 2.4* 2.9*  --  3.2*  CREATININE 2.54* 2.52* 3.13*  --  3.55*  VANCORANDOM  --   --   --  20 22    Estimated Creatinine Clearance: 20.3 mL/min (A) (by C-G formula based on SCr of 3.55 mg/dL (H)).    No Known Allergies  Antimicrobials this admission: 2/17 cefepime >> 2/18 2/17 vancomycin>  2/18 Influenza A (+)   Microbiology results: 2/18  BCx: MRSA 2/17  MRSA PCR: Positive   Thank you for allowing pharmacy to be a part of this patient's care.  Laural Benes, Pharm.D., BCPS Clinical Pharmacist 10/15/2017 9:01 AM

## 2017-10-15 NOTE — Progress Notes (Signed)
Patient ID: Adam Wall, male   DOB: 1946-03-22, 72 y.o.   MRN: [id number]  [employer] PROGRESS NOTE  Adam Wall FGH:[id number] DOB: Oct 23, 1945 DOA: 10/12/2017 PCP: [name], MD  HPI/Subjective: Feels sick. confused  Objective: Vitals:   10/15/17 0436 10/15/17 0743  BP: 116/64   Pulse: 94 (!) 114  Resp: 20 16  Temp: 98 F (36.7 C)   SpO2: 90% 97%    Filed Weights   10/13/17 0134 10/14/17 2143 10/15/17 0500  Weight: 87.8 kg (193 lb 9 oz) 91 kg (200 lb 9.6 oz) 91.4 kg (201 lb 9.6 oz)    ROS: confused so unable to get ROS Exam: Physical Exam  Constitutional: He is oriented to person, place, and time. He is uncooperative.  confused  HENT:  Nose: No mucosal edema.  Mouth/Throat: No oropharyngeal exudate or posterior oropharyngeal edema.  Eyes: Conjunctivae, EOM and lids are normal. Pupils are equal, round, and reactive to light.  Neck: No JVD present. Carotid bruit is not present. No edema present. No thyroid mass and no thyromegaly present.  Cardiovascular: S1 normal and S2 normal. Exam reveals no gallop.  No murmur heard. Pulses:      Dorsalis pedis pulses are 2+ on the right side, and 2+ on the left side.  Respiratory: No respiratory distress. He has decreased breath sounds in the right lower field and the left lower field. He has no wheezes. He has no rhonchi. He has no rales.  GI: Soft. Bowel sounds are normal. There is no tenderness.  Musculoskeletal:       Right ankle: He exhibits swelling.       Left ankle: He exhibits swelling.  Lymphadenopathy:    He has no cervical adenopathy.  Neurological: He is oriented to person, place, and time. No cranial nerve deficit.  Skin: Skin is warm. Nails show no clubbing.  Bilateral heel decubiti with discoloration of the skin bilaterally.  Stage II decubitus ulcer on buttock  Psychiatric: He has a normal mood and affect.      Data Reviewed: Basic Metabolic Panel: Recent Labs  Lab 10/12/17 1640  10/13/17 0459 10/14/17 0533 10/15/17 0710  NA 141 141 136 135  K 4.1 4.0 4.4 5.1  CL 113* 113* 108 107  CO2 20* 17* 17* 17*  GLUCOSE 196* 262* 256* 209*  BUN 53* 57* 74* 85*  CREATININE 2.54* 2.52* 3.13* 3.55*  CALCIUM 7.0* 6.9* 6.6* 6.7*  MG  --  1.6*  --   --    Liver Function Tests: Recent Labs  Lab 10/12/17 1640 10/15/17 0710  AST 65* 38  ALT 41 32  ALKPHOS 61 63  BILITOT 0.8 0.8  PROT 5.6* 5.2*  ALBUMIN 1.9* 1.8*   CBC: Recent Labs  Lab 10/12/17 1640 10/13/17 0459 10/14/17 0533 10/15/17 0710  WBC 1.7* 2.4* 2.9* 3.2*  NEUTROABS 1.3*  --   --   --   HGB 8.0* 7.8* 7.9* 8.0*  HCT 24.9* 23.7* 24.1* 24.1*  MCV 70.4* 69.7* 70.1* 69.6*  PLT 71* 71* 87* 107*   Cardiac Enzymes: Recent Labs  Lab 10/12/17 1640  TROPONINI 1.22*   BNP (last 3 results) Recent Labs    09/04/17 1325 09/10/17 2113 10/12/17 1640  BNP 245.0* 137.0* 534.0*     CBG: Recent Labs  Lab 10/15/17 0728 10/15/17 0753 10/15/17 0857 10/15/17 1140 10/15/17 1611  GLUCAP 202* 211* 195* 243* 165*    Recent Results (from the past 240 hour(s))  Culture, blood (routine x  2)     Status: Abnormal   Collection Time: 10/12/17  5:05 PM  Result Value Ref Range Status   Specimen Description   Final    BLOOD RIGHT ANTECUBITAL Performed at [facility], [address] [place], [place] [address]    Special Requests   Final    BOTTLES DRAWN AEROBIC AND ANAEROBIC Blood Culture results may not be optimal due to an excessive volume of blood received in culture bottles Performed at [facility], [address] [place], [place] [address]    Culture  Setup Time   Final    GRAM POSITIVE COCCI IN BOTH AEROBIC AND ANAEROBIC BOTTLES CRITICAL RESULT CALLED TO, READ BACK BY AND VERIFIED WITH: JASON ROBBINS AT 0981 ON 10/13/2017 JJB Performed at [facility], [place] [address] [place], [place] [address]    Culture METHICILLIN RESISTANT STAPHYLOCOCCUS AUREUS (A)  Final   Report  Status 10/15/2017 FINAL  Final   Organism ID, Bacteria METHICILLIN RESISTANT STAPHYLOCOCCUS AUREUS  Final      Susceptibility   Methicillin resistant staphylococcus aureus - MIC*    CIPROFLOXACIN >=8 RESISTANT Resistant     ERYTHROMYCIN >=8 RESISTANT Resistant     GENTAMICIN <=0.5 SENSITIVE Sensitive     OXACILLIN >=4 RESISTANT Resistant     TETRACYCLINE <=1 SENSITIVE Sensitive     VANCOMYCIN 1 SENSITIVE Sensitive     TRIMETH/SULFA <=10 SENSITIVE Sensitive     CLINDAMYCIN >=8 RESISTANT Resistant     RIFAMPIN <=0.5 SENSITIVE Sensitive     Inducible Clindamycin NEGATIVE Sensitive     * METHICILLIN RESISTANT STAPHYLOCOCCUS AUREUS  Culture, blood (routine x 2)     Status: Abnormal   Collection Time: 10/12/17  5:05 PM  Result Value Ref Range Status   Specimen Description   Final    BLOOD RIGHT HAND Performed at [facility], [address], [place], [place] [address]    Special Requests   Final    BOTTLES DRAWN AEROBIC AND ANAEROBIC Blood Culture adequate volume Performed at [facility], [address] [place], [place] [address]    Culture  Setup Time   Final    GRAM POSITIVE COCCI IN BOTH AEROBIC AND ANAEROBIC BOTTLES CRITICAL RESULT CALLED TO, READ BACK BY AND VERIFIED WITH: JASON ROBBINS ATR 1514 ON 10/13/2017 JJB Performed at [facility], [address] [place], [place] [address]    Culture (A)  Final    STAPHYLOCOCCUS AUREUS SUSCEPTIBILITIES PERFORMED ON PREVIOUS CULTURE WITHIN THE LAST 5 DAYS. Performed at [facility], [place] [address], [place], [place] [address]    Report Status 10/15/2017 FINAL  Final  Blood Culture ID Panel (Reflexed)     Status: Abnormal   Collection Time: 10/12/17  5:05 PM  Result Value Ref Range Status   Enterococcus species NOT DETECTED NOT DETECTED Final   Listeria monocytogenes NOT DETECTED NOT DETECTED Final   Staphylococcus species DETECTED (A) NOT DETECTED Final    Comment: CRITICAL RESULT CALLED TO, READ  BACK BY AND VERIFIED WITH: JASON ROBBINS AT 1514 ON 10/13/2017 JJB    Staphylococcus aureus DETECTED (A) NOT DETECTED Final    Comment: Methicillin (oxacillin)-resistant Staphylococcus aureus (MRSA). MRSA is predictably resistant to beta-lactam antibiotics (except ceftaroline). Preferred therapy is vancomycin unless clinically contraindicated. Patient requires contact precautions if  hospitalized. CRITICAL RESULT CALLED TO, READ BACK BY AND VERIFIED WITH: JASON ROBBINS AT 5284 ON 10/13/2017 JJB    Methicillin resistance DETECTED (A) NOT DETECTED Final  Comment: CRITICAL RESULT CALLED TO, READ BACK BY AND VERIFIED WITH: JASON ROBBINS AT 1941 ON 10/13/2017 JJB    Streptococcus species NOT DETECTED NOT DETECTED Final   Streptococcus agalactiae NOT DETECTED NOT DETECTED Final   Streptococcus pneumoniae NOT DETECTED NOT DETECTED Final   Streptococcus pyogenes NOT DETECTED NOT DETECTED Final   Acinetobacter baumannii NOT DETECTED NOT DETECTED Final   Enterobacteriaceae species NOT DETECTED NOT DETECTED Final   Enterobacter cloacae complex NOT DETECTED NOT DETECTED Final   Escherichia coli NOT DETECTED NOT DETECTED Final   Klebsiella oxytoca NOT DETECTED NOT DETECTED Final   Klebsiella pneumoniae NOT DETECTED NOT DETECTED Final   Proteus species NOT DETECTED NOT DETECTED Final   Serratia marcescens NOT DETECTED NOT DETECTED Final   Haemophilus influenzae NOT DETECTED NOT DETECTED Final   Neisseria meningitidis NOT DETECTED NOT DETECTED Final   Pseudomonas aeruginosa NOT DETECTED NOT DETECTED Final   Candida albicans NOT DETECTED NOT DETECTED Final   Candida glabrata NOT DETECTED NOT DETECTED Final   Candida krusei NOT DETECTED NOT DETECTED Final   Candida parapsilosis NOT DETECTED NOT DETECTED Final   Candida tropicalis NOT DETECTED NOT DETECTED Final    Comment: Performed at [facility] [address] [place], [place] [address]  MRSA PCR Screening     Status: Abnormal    Collection Time: 10/12/17 11:04 PM  Result Value Ref Range Status   MRSA by PCR POSITIVE (A) NEGATIVE Final    Comment:        The GeneXpert MRSA Assay (FDA approved for NASAL specimens only), is one component of a comprehensive MRSA colonization surveillance program. It is not intended to diagnose MRSA infection nor to guide or monitor treatment for MRSA infections. RESULT CALLED TO, READ BACK BY AND VERIFIED WITH: TONI WALKER AT 0052 10/13/17.PMH Performed at [facility], [address] [place], [place] [address]   Culture, blood (single) w Reflex to ID Panel     Status: None (Preliminary result)   Collection Time: 10/13/17  4:01 PM  Result Value Ref Range Status   Specimen Description BLOOD Blood Culture adequate volume  Final   Special Requests BLOOD RIGHT HAND  Final   Culture   Final    NO GROWTH 2 DAYS Performed at [facility], [address], [place], [place] [address]    Report Status PENDING  Incomplete     Studies: Dg Chest Port 1 View  Result Date: 10/14/2017 CLINICAL DATA:  Respiratory failure EXAM: PORTABLE CHEST 1 VIEW COMPARISON:  10/12/2017 FINDINGS: Left AICD remains in place, unchanged. Prior CABG. Cardiomegaly. Improving vascular congestion. Left perihilar and lower lobe atelectasis or infiltrate noted. No visible effusions or acute bony abnormality. IMPRESSION: Cardiomegaly.  Improving vascular congestion. Left perihilar and lower lobe atelectasis or infiltrate. Electronically Signed   By: [name] M.D.   On: 10/14/2017 07:48    Scheduled Meds: . acyclovir  400 mg Oral Daily  . budesonide (PULMICORT) nebulizer solution  0.25 mg Nebulization Q6H  . butamben-tetracaine-benzocaine      . calcium-vitamin D  1 tablet Oral BID  . carvedilol  25 mg Oral BID WC  . docusate sodium  100 mg Oral BID  . fentaNYL  12.5 mcg Transdermal Q72H  . furosemide  80 mg Intravenous Daily  . heparin  5,000 Units Subcutaneous Q8H  . insulin aspart   0-20 Units Subcutaneous TID WC  . insulin aspart  0-5 Units Subcutaneous QHS  . insulin detemir  3 Units  Subcutaneous BID  . ipratropium-albuterol  3 mL Nebulization Q6H  . lidocaine      . mupirocin ointment   Topical Daily  . oseltamivir  30 mg Oral Daily  . rifampin  300 mg Oral Q12H  . tamsulosin  0.4 mg Oral Daily  . traZODone  50 mg Oral QHS  . vitamin B-12  1,000 mcg Oral Daily  . vitamin C  500 mg Oral Daily   Continuous Infusions: . sodium chloride    . DAPTOmycin (CUBICIN)  IV      Assessment/Plan: Zaeem Kandel Woodsis a 72 y.o.malewith myeloma, on chemo admitted with increasing soband found to have LLL infiltrate, influenza and MRSA bacteremia.Has sacral and heel ulcerswith are potential sources of MRSA bacteremia infection.Course complicated by cytopenias and ARF. He will need evaluation for endocarditis as well as evaluation by EP for removal of the AICD which is likely seeded.   1. Clinical sepsis, healthcare acquired pneumonia.  Positive blood cultures with staph aureus with methicillin resistance.  Await final sensitivities.  Infectious disease consultation appreciated.  TEE for tomorrow as he refused it today.   The patient does have a defibrillator and cardiology at this point does not want to take this out.  Patient on IV vancomycin. - 2/19 - no fevers, Echo neg. [place] 2/18 fu ngtd. Day 4 vanco, day 3 of 5 tamiflu 2/20 - he declined TEE today. No fevers, cr up to 3.55 - seen by nephrology.  Vanco level remains therapeutic at 22. He is more confused today- maybe uremia.  - stop Vanco and change it to daptomycin - adding rifampin per ID - plan on leaving ICD in place and suppressing with doxycycline after a 6 week course of IV abx 2. Acute hypoxic respiratory failure.  Patient off BiPAP and now on nasal cannula. 3. Influenza A positive on Tamiflu.  4. Acute kidney injury on chronic kidney disease stage III.  Continue to monitor creatinine closely while on  vancomycin.  Creatinine went up to 3.55 5. Chronic multiple myeloma with pancytopenia.  Last chemotherapy February 4th. 6. Chronic heel and sacral decubiti.  Local wound care 7. Hypomagnesemia replaced 8. Recent lumbar spine compression fracture with interventional radiology kyphoplasty on last hospitalization. 9. Anemia of chronic disease 10. Atrial fibrillation with rapid ventricular response on Coreg 11. BPH on Flomax 12. Type 2 diabetes mellitus on sliding scale and low-dose Levemir 13. Urinary retention Foley is in place  Code Status:     Code Status Orders  (From admission, onward)        Start     Ordered   10/12/17 2157  Full code  Continuous     10/12/17 2156    Code Status History    Date Active Date Inactive Code Status Order ID Comments User Context   09/11/2017 03:08 09/16/2017 20:28 Full Code [id number]  Amelia Jo, MD Inpatient   09/04/2017 17:55 09/08/2017 18:54 Full Code [id number]  [name], MD ED     Family Communication: wife at the bedside Disposition Plan: To be determined  Consultants:  Infectious disease  Antibiotics:  Daptomycin  Rifampin  Time spent: 25 minutes  [place]

## 2017-10-15 NOTE — Progress Notes (Signed)
Patient's blood pressure 98/54. Patient asymptomatic. MD notified, verbal orders given. Will continue to monitor patient.

## 2017-10-15 NOTE — Consult Note (Signed)
Pharmacy Antibiotic Note  Adam Wall is a 72 y.o. male admitted on 10/12/2017 with bacteremia.  Pharmacy has been consulted for Vancomycin dosing.  Patient ordered vancomycin 1g IV every 24 hours. Patient has received 3 doses to date.   2/19 PM vanc level 20. Will recheck with AM labs. Redose if AM level <20.    2/17 BCID: MRSA  Plan: Serum creatinine levels continue to rise (2.52>>3.13). Hold vancomycin and dose based on follow up vanc levels.  First random level ordered for 2/19 @ 2300.  Pharmacy will continue to monitor and adjust per consult.  Height: 6' (182.9 cm) Weight: 200 lb 9.6 oz (91 kg) IBW/kg (Calculated) : 77.6  Temp (24hrs), Avg:97.5 F (36.4 C), Min:97.3 F (36.3 C), Max:97.6 F (36.4 C)  Recent Labs  Lab 10/12/17 1640 10/13/17 0459 10/14/17 0533 10/14/17 2241  WBC 1.7* 2.4* 2.9*  --   CREATININE 2.54* 2.52* 3.13*  --   VANCORANDOM  --   --   --  20    Estimated Creatinine Clearance: 23.1 mL/min (A) (by C-G formula based on SCr of 3.13 mg/dL (H)).    No Known Allergies  Antimicrobials this admission: 2/17 cefepime >> 2/18 2/17 vancomycin>  2/18 Influenza A (+)   Microbiology results: 2/18  BCx: MRSA 2/17  MRSA PCR: Positive   Thank you for allowing pharmacy to be a part of this patient's care.  [place] Intern 10/15/2017 12:30 AM

## 2017-10-15 NOTE — Progress Notes (Signed)
Patient Name: Balian Schaller Date of Encounter: 10/15/2017  Hospital Problem List     Active Problems:   Acute respiratory failure with hypoxemia ([place])   Respiratory failure [facility])   Advanced care planning/counseling discussion   Palliative care by specialist    Patient Profile     Pt with aicd in place, multiple myeloma, pancytopenia and mrsa bacteremia  Subjective   Mild confusion  Inpatient Medications    . acyclovir  400 mg Oral Daily  . atorvastatin  40 mg Oral QHS  . budesonide (PULMICORT) nebulizer solution  0.25 mg Nebulization Q6H  . butamben-tetracaine-benzocaine      . calcium-vitamin D  1 tablet Oral BID  . carvedilol  25 mg Oral BID WC  . docusate sodium  100 mg Oral BID  . fentaNYL  12.5 mcg Transdermal Q72H  . furosemide  80 mg Intravenous Daily  . heparin  5,000 Units Subcutaneous Q8H  . insulin aspart  0-20 Units Subcutaneous TID WC  . insulin aspart  0-5 Units Subcutaneous QHS  . insulin detemir  3 Units Subcutaneous BID  . ipratropium-albuterol  3 mL Nebulization Q6H  . lidocaine      . mupirocin ointment   Topical Daily  . oseltamivir  30 mg Oral Daily  . potassium chloride SA  20 mEq Oral BID  . sodium chloride flush      . tamsulosin  0.4 mg Oral Daily  . traZODone  50 mg Oral QHS  . vitamin B-12  1,000 mcg Oral Daily  . vitamin C  500 mg Oral Daily    Vital Signs    Vitals:   10/15/17 0206 10/15/17 0436 10/15/17 0500 10/15/17 0743  BP:  116/64    Pulse:  94  (!) 114  Resp:  20  16  Temp:  98 F (36.7 C)    TempSrc:  Oral    SpO2: 100% 90%  97%  Weight:   91.4 kg (201 lb 9.6 oz)   Height:        Intake/Output Summary (Last 24 hours) at 10/15/2017 0847 Last data filed at 10/14/2017 2152 Gross per 24 hour  Intake -  Output 820 ml  Net -820 ml   Filed Weights   10/13/17 0134 10/14/17 2143 10/15/17 0500  Weight: 87.8 kg (193 lb 9 oz) 91 kg (200 lb 9.6 oz) 91.4 kg (201 lb 9.6 oz)    Physical Exam    GEN: Well nourished,  well developed, in no acute distress.  HEENT: normal.  Neck: Supple, no JVD, carotid bruits, or masses. Cardiac: RRR, no murmurs, rubs, or gallops. No clubbing, cyanosis, edema.  Radials/DP/PT 2+ and equal bilaterally.  Respiratory:  Respirations regular and unlabored, clear to auscultation bilaterally. GI: Soft, nontender, nondistended, BS + x 4. MS: no deformity or atrophy. Skin: warm and dry, no rash. Neuro:  Strength and sensation are intact. Psych: Normal affect.  Labs    CBC Recent Labs    10/12/17 1640  10/14/17 0533 10/15/17 0710  WBC 1.7*   < > 2.9* 3.2*  NEUTROABS 1.3*  --   --   --   HGB 8.0*   < > 7.9* 8.0*  HCT 24.9*   < > 24.1* 24.1*  MCV 70.4*   < > 70.1* 69.6*  PLT 71*   < > 87* 107*   < > = values in this interval not displayed.   Basic Metabolic Panel Recent Labs    10/13/17 0459 10/14/17 0533  10/15/17 0710  NA 141 136 135  K 4.0 4.4 5.1  CL 113* 108 107  CO2 17* 17* 17*  GLUCOSE 262* 256* 209*  BUN 57* 74* 85*  CREATININE 2.52* 3.13* 3.55*  CALCIUM 6.9* 6.6* 6.7*  MG 1.6*  --   --    Liver Function Tests Recent Labs    10/12/17 1640 10/15/17 0710  AST 65* 38  ALT 41 32  ALKPHOS 61 63  BILITOT 0.8 0.8  PROT 5.6* 5.2*  ALBUMIN 1.9* 1.8*   No results for input(s): LIPASE, AMYLASE in the last 72 hours. Cardiac Enzymes Recent Labs    10/12/17 1640  TROPONINI 1.22*   BNP Recent Labs    10/12/17 1640  BNP 534.0*   D-Dimer No results for input(s): DDIMER in the last 72 hours. Hemoglobin A1C No results for input(s): HGBA1C in the last 72 hours. Fasting Lipid Panel No results for input(s): CHOL, HDL, LDLCALC, TRIG, CHOLHDL, LDLDIRECT in the last 72 hours. Thyroid Function Tests No results for input(s): TSH, T4TOTAL, T3FREE, THYROIDAB in the last 72 hours.  Invalid input(s): FREET3  Telemetry    A pace, v pace  ECG    A pace v pace.   Radiology    Dg Chest 2 View  Result Date: 10/07/2017 CLINICAL DATA:  Fever,  productive cough, pleuritic chest pain, fever. History of multiple myeloma, diabetes, coronary artery disease and previous MI, CHF, former smoker. EXAM: CHEST  2 VIEW COMPARISON:  Portable chest x-ray of July 11, 2018 FINDINGS: The lungs are well-expanded. The interstitial markings are patchy and coarse. There is an infiltrate in the left lower lobe posteriorly. The cardiac silhouette remains enlarged. The central pulmonary vascularity is prominent. The ICD is in stable position. There is calcification in the wall of the aortic arch. The sternal wires are intact. Calcified the observed bony thorax is unremarkable. There is a moderate amount of gas and fluid in the upper abdomen. IMPRESSION: Left lower lobe pneumonia. Patchy interstitial density in both lungs has appeared since the previous study and may reflect a pneumonitis type pattern or less likely pulmonary edema. Followup PA and lateral chest X-ray is recommended in 3-4 weeks following trial of antibiotic therapy to ensure resolution and exclude underlying malignancy. Cardiomegaly.  Previous CABG.  Thoracic aortic atherosclerosis. Electronically Signed   By: David  Martinique M.D.   On: 10/07/2017 14:03   Dg Chest Port 1 View  Result Date: 10/14/2017 CLINICAL DATA:  Respiratory failure EXAM: PORTABLE CHEST 1 VIEW COMPARISON:  10/12/2017 FINDINGS: Left AICD remains in place, unchanged. Prior CABG. Cardiomegaly. Improving vascular congestion. Left perihilar and lower lobe atelectasis or infiltrate noted. No visible effusions or acute bony abnormality. IMPRESSION: Cardiomegaly.  Improving vascular congestion. Left perihilar and lower lobe atelectasis or infiltrate. Electronically Signed   By: [name] M.D.   On: 10/14/2017 07:48   Dg Chest Portable 1 View  Result Date: 10/12/2017 CLINICAL DATA:  Shortness of breath. EXAM: PORTABLE CHEST 1 VIEW COMPARISON:  Chest x-ray dated October 07, 2017. FINDINGS: Unchanged left chest wall AICD. Stable  cardiomegaly. Mild central vascular congestion. Unchanged retrocardiac opacity. No large pleural effusion. No pneumothorax. No acute osseous abnormality. IMPRESSION: 1. Stable cardiomegaly and mild central vascular congestion. No overt pulmonary edema. 2. Unchanged left lower lobe pneumonia. Electronically Signed   By: Titus Dubin M.D.   On: 10/12/2017 17:54    Assessment & Plan    Pt is a 72 yo male with history of MM, history of  ischemic cardiomyopathy with aicd in place now admitted with confusion, sepsis and MRSA positive blood cultures. Very high risk for aicd lead and generator extraction. Brought to cardiac suite this am for consideration for TEE to evaluate for evidence of vegetations on valves and pacer leads. Pt refuse procedure today. He has had this procedure doen in the past and is aware of what is involved with the procedure. Will cancel today and reschedule if patient agrees. Would continue with aggressive iv abx and will need consideration for supression therapy of the probable aicd lead and generator seeing of the staph.   Signed, Javier Docker Tavaras Goody MD 10/15/2017, 8:47 AM  Pager: [phone]

## 2017-10-15 NOTE — Progress Notes (Signed)
Patient complaining of sore throat. MD notified, verbal orders given. Tessalon ordered, will continue to monitor patient.

## 2017-10-15 NOTE — Progress Notes (Signed)
Patient confused, refuses to get his blood pressure taken. Refused some of his medications. Will continue to re attempt and monitor.

## 2017-10-15 NOTE — Progress Notes (Signed)
Wound care and dressing change done. Patient refused PRN medication for sore throat.  Will continue to monitor patient.

## 2017-10-15 NOTE — Progress Notes (Addendum)
Inpatient Diabetes Program Recommendations  AACE/ADA: New Consensus Statement on Inpatient Glycemic Control (2015)  Target Ranges:  Prepandial:   less than 140 mg/dL      Peak postprandial:   less than 180 mg/dL (1-2 hours)      Critically ill patients:  140 - 180 mg/dL   Results for Adam Wall, Adam Wall (MRN [id number]) as of 10/15/2017 12:45  Ref. Range 10/14/2017 07:39 10/14/2017 11:47 10/14/2017 16:25 10/14/2017 21:07  Glucose-Capillary Latest Ref Range: 65 - 99 mg/dL 259 (H)  11 units Novolog 278 (H)  11 units Novolog +  3 units Levemir 254 (H)  11 units Novolog 146 (H)     3 units Levemir   Results for Adam Wall, Adam Wall (MRN [id number]) as of 10/15/2017 12:45  Ref. Range 10/15/2017 08:57 10/15/2017 11:40  Glucose-Capillary Latest Ref Range: 65 - 99 mg/dL 195 (H)  Novolog HELD 243 (H)   7 units Novolog    Home DM Meds: Novolin 70/30 Insulin- 8 units bid   Current Orders: Levemir 3 units BID       Novolog Resistant Correction Scale/ SSI (0-20 units) TID AC + HS     MD- Please consider the following in-hospital insulin adjustments:  1. Increase Levemir slightly to 4 units BID  2. Reduce Novolog SSI to Sensitive scale (0-9 units) TID AC + HS  3. Start Novolog Meal Coverage: Novolog 4 units TID with meals (hold if pt eats <50% of meal)      --Will follow patient during hospitalization--  Wyn Quaker RN, MSN, CDE Diabetes Coordinator Inpatient Glycemic Control Team Team Pager: [phone] (8a-5p)

## 2017-10-15 NOTE — Telephone Encounter (Signed)
I have form from [employer] and I have filled it out. Will need Janese Banks to sign. I will check with [employer] To make sure they he will stay on med. He currently in [place]. With pneumonia, flu  And is in CCU

## 2017-10-15 NOTE — Progress Notes (Signed)
[facility] INFECTIOUS DISE[facility] PROGRESS NOTE Date of Admission:  10/12/2017     ID: Adam Wall is a 72 y.o. male with  MRSA bacteremia, presumed AICD infection Active Problems:   Acute respiratory failure with hypoxemia ([place])   Respiratory failure ([place])   Advanced care planning/counseling discussion   Palliative care by specialist   Subjective: More confused and lethargic today.  Out of unit, no fevers. Declined TEE  ROS  Unable to obtain  Medications:  Antibiotics Given (last 72 hours)    Date/Time Action Medication Dose Rate   10/12/17 1856 New Bag/Given   ceFEPIme (MAXIPIME) 2 g in sodium chloride 0.9 % 100 mL IVPB 2 g 200 mL/hr   10/12/17 1942 New Bag/Given   vancomycin (VANCOCIN) IVPB 1000 mg/200 mL premix 1,000 mg 200 mL/hr   10/13/17 0130 New Bag/Given   vancomycin (VANCOCIN) IVPB 1000 mg/200 mL premix 1,000 mg 200 mL/hr   10/13/17 0540 New Bag/Given   ceFEPIme (MAXIPIME) 1 g in sodium chloride 0.9 % 100 mL IVPB 1 g 200 mL/hr   10/13/17 0942 Given   acyclovir (ZOVIRAX) 200 MG capsule 400 mg 400 mg    10/13/17 0942 Given   oseltamivir (TAMIFLU) capsule 30 mg 30 mg    10/13/17 2320 New Bag/Given   vancomycin (VANCOCIN) IVPB 1000 mg/200 mL premix 1,000 mg 200 mL/hr   10/14/17 1129 Given   oseltamivir (TAMIFLU) capsule 30 mg 30 mg    10/14/17 1130 Given   acyclovir (ZOVIRAX) 200 MG capsule 400 mg 400 mg    10/15/17 1019 Given   oseltamivir (TAMIFLU) capsule 30 mg 30 mg      . acyclovir  400 mg Oral Daily  . atorvastatin  40 mg Oral QHS  . budesonide (PULMICORT) nebulizer solution  0.25 mg Nebulization Q6H  . butamben-tetracaine-benzocaine      . calcium-vitamin D  1 tablet Oral BID  . carvedilol  25 mg Oral BID WC  . docusate sodium  100 mg Oral BID  . fentaNYL  12.5 mcg Transdermal Q72H  . furosemide  80 mg Intravenous Daily  . heparin  5,000 Units Subcutaneous Q8H  . insulin aspart  0-20 Units Subcutaneous TID WC  . insulin aspart  0-5 Units  Subcutaneous QHS  . insulin detemir  3 Units Subcutaneous BID  . ipratropium-albuterol  3 mL Nebulization Q6H  . lidocaine      . mupirocin ointment   Topical Daily  . oseltamivir  30 mg Oral Daily  . tamsulosin  0.4 mg Oral Daily  . traZODone  50 mg Oral QHS  . vitamin B-12  1,000 mcg Oral Daily  . vitamin C  500 mg Oral Daily    Objective: Vital signs in last 24 hours: Temp:  [97.3 F (36.3 C)-98 F (36.7 C)] 98 F (36.7 C) (02/20 0436) Pulse Rate:  [83-128] 114 (02/20 0743) Resp:  [14-20] 16 (02/20 0743) BP: (83-132)/(52-99) 116/64 (02/20 0436) SpO2:  [90 %-100 %] 97 % (02/20 0743) Weight:  [91 kg (200 lb 9.6 oz)-91.4 kg (201 lb 9.6 oz)] 91.4 kg (201 lb 9.6 oz) (02/20 0500) Constitutional: He is chronically ill appearing, lethargic but arousable, some myoclonus HENT: anicteric, pale Mouth/Throat: Oropharynx is clear and dry . No oropharyngeal exudate.  Cardiovascular: Normal rate, regular rhythm and normal heart sounds. 2/6 sm AICD in place, non tender Pulmonary/Chest: poor air movement Abdominal: Soft. Bowel sounds are normal. He exhibits no distension. There is no tenderness.  Lymphadenopathy: He has no cervical  adenopathy.  Neurological: He is lethargic but arousable and able to answer yes and no questions., some myoclonus and asterix Ext no cce Skin: bil heels with pressure injury and bruising but no open wounds or drainage Psychiatric: flat affect   Lab Results Recent Labs    10/14/17 0533 10/15/17 0710  WBC 2.9* 3.2*  HGB 7.9* 8.0*  HCT 24.1* 24.1*  NA 136 135  K 4.4 5.1  CL 108 107  CO2 17* 17*  BUN 74* 85*  CREATININE 3.13* 3.55*    Microbiology: Results for orders placed or performed during the hospital encounter of 10/12/17  Culture, blood (routine x 2)     Status: Abnormal   Collection Time: 10/12/17  5:05 PM  Result Value Ref Range Status   Specimen Description   Final    BLOOD RIGHT ANTECUBITAL Performed at [facility], [address], [place], [place] [address]    Special Requests   Final    BOTTLES DRAWN AEROBIC AND ANAEROBIC Blood Culture results may not be optimal due to an excessive volume of blood received in culture bottles Performed at [facility], [address] [place], [place] [address]    Culture  Setup Time   Final    GRAM POSITIVE COCCI IN BOTH AEROBIC AND ANAEROBIC BOTTLES CRITICAL RESULT CALLED TO, READ BACK BY AND VERIFIED WITH: JASON ROBBINS AT 5852 ON 10/13/2017 [place] Performed at [facility], [place] [address], [place], [place] [address]    Culture METHICILLIN RESISTANT STAPHYLOCOCCUS AUREUS (A)  Final   Report Status 10/15/2017 FINAL  Final   Organism ID, Bacteria METHICILLIN RESISTANT STAPHYLOCOCCUS AUREUS  Final      Susceptibility   Methicillin resistant staphylococcus aureus - MIC*    CIPROFLOXACIN >=8 RESISTANT Resistant     ERYTHROMYCIN >=8 RESISTANT Resistant     GENTAMICIN <=0.5 SENSITIVE Sensitive     OXACILLIN >=4 RESISTANT Resistant     TETRACYCLINE <=1 SENSITIVE Sensitive     VANCOMYCIN 1 SENSITIVE Sensitive     TRIMETH/SULFA <=10 SENSITIVE Sensitive     CLINDAMYCIN >=8 RESISTANT Resistant     RIFAMPIN <=0.5 SENSITIVE Sensitive     Inducible Clindamycin NEGATIVE Sensitive     * METHICILLIN RESISTANT STAPHYLOCOCCUS AUREUS  Culture, blood (routine x 2)     Status: Abnormal   Collection Time: 10/12/17  5:05 PM  Result Value Ref Range Status   Specimen Description   Final    BLOOD RIGHT HAND Performed at [facility], [address], [place], [place] [address]    Special Requests   Final    BOTTLES DRAWN AEROBIC AND ANAEROBIC Blood Culture adequate volume Performed at [facility], [address] [place], [place] [address]    Culture  Setup Time   Final    GRAM POSITIVE COCCI IN BOTH AEROBIC AND ANAEROBIC BOTTLES CRITICAL RESULT CALLED TO, READ BACK BY AND VERIFIED WITH: JASON ROBBINS ATR 1514 ON 10/13/2017 [place] Performed at  [facility], [address] [place], [place] [address]    Culture (A)  Final    STAPHYLOCOCCUS AUREUS SUSCEPTIBILITIES PERFORMED ON PREVIOUS CULTURE WITHIN THE LAST 5 DAYS. Performed at [facility], [place] [address] [place],  [address]    Report Status 10/15/2017 FINAL  Final  Blood Culture ID Panel (Reflexed)     Status: Abnormal   Collection Time: 10/12/17  5:05 PM  Result Value Ref Range Status   Enterococcus species NOT DETECTED NOT DETECTED  Final   Listeria monocytogenes NOT DETECTED NOT DETECTED Final   Staphylococcus species DETECTED (A) NOT DETECTED Final    Comment: CRITICAL RESULT CALLED TO, READ BACK BY AND VERIFIED WITH: JASON ROBBINS AT 1514 ON 10/13/2017 JJB    Staphylococcus aureus DETECTED (A) NOT DETECTED Final    Comment: Methicillin (oxacillin)-resistant Staphylococcus aureus (MRSA). MRSA is predictably resistant to beta-lactam antibiotics (except ceftaroline). Preferred therapy is vancomycin unless clinically contraindicated. Patient requires contact precautions if  hospitalized. CRITICAL RESULT CALLED TO, READ BACK BY AND VERIFIED WITH: JASON ROBBINS AT 7829 ON 10/13/2017 JJB    Methicillin resistance DETECTED (A) NOT DETECTED Final    Comment: CRITICAL RESULT CALLED TO, READ BACK BY AND VERIFIED WITH: JASON ROBBINS AT 5621 ON 10/13/2017 JJB    Streptococcus species NOT DETECTED NOT DETECTED Final   Streptococcus agalactiae NOT DETECTED NOT DETECTED Final   Streptococcus pneumoniae NOT DETECTED NOT DETECTED Final   Streptococcus pyogenes NOT DETECTED NOT DETECTED Final   Acinetobacter baumannii NOT DETECTED NOT DETECTED Final   Enterobacteriaceae species NOT DETECTED NOT DETECTED Final   Enterobacter cloacae complex NOT DETECTED NOT DETECTED Final   Escherichia coli NOT DETECTED NOT DETECTED Final   Klebsiella oxytoca NOT DETECTED NOT DETECTED Final   Klebsiella pneumoniae NOT DETECTED NOT DETECTED Final   Proteus species NOT DETECTED  NOT DETECTED Final   Serratia marcescens NOT DETECTED NOT DETECTED Final   Haemophilus influenzae NOT DETECTED NOT DETECTED Final   Neisseria meningitidis NOT DETECTED NOT DETECTED Final   Pseudomonas aeruginosa NOT DETECTED NOT DETECTED Final   Candida albicans NOT DETECTED NOT DETECTED Final   Candida glabrata NOT DETECTED NOT DETECTED Final   Candida krusei NOT DETECTED NOT DETECTED Final   Candida parapsilosis NOT DETECTED NOT DETECTED Final   Candida tropicalis NOT DETECTED NOT DETECTED Final    Comment: Performed at [facility], [address] [place], [place] [address]  MRSA PCR Screening     Status: Abnormal   Collection Time: 10/12/17 11:04 PM  Result Value Ref Range Status   MRSA by PCR POSITIVE (A) NEGATIVE Final    Comment:        The GeneXpert MRSA Assay (FDA approved for NASAL specimens only), is one component of a comprehensive MRSA colonization surveillance program. It is not intended to diagnose MRSA infection nor to guide or monitor treatment for MRSA infections. RESULT CALLED TO, READ BACK BY AND VERIFIED WITH: TONI WALKER AT 0052 10/13/17.[place] Performed at [facility], [address] [place], [place] [address]   Culture, blood (single) w Reflex to ID Panel     Status: None (Preliminary result)   Collection Time: 10/13/17  4:01 PM  Result Value Ref Range Status   Specimen Description BLOOD Blood Culture adequate volume  Final   Special Requests BLOOD RIGHT HAND  Final   Culture   Final    NO GROWTH 2 DAYS Performed at [facility], [address], [place], [place] [address]    Report Status PENDING  Incomplete    Studies/Results: Dg Chest Port 1 View  Result Date: 10/14/2017 CLINICAL DATA:  Respiratory failure EXAM: PORTABLE CHEST 1 VIEW COMPARISON:  10/12/2017 FINDINGS: Left AICD remains in place, unchanged. Prior CABG. Cardiomegaly. Improving vascular congestion. Left perihilar and lower lobe atelectasis or infiltrate  noted. No visible effusions or acute bony abnormality. IMPRESSION: Cardiomegaly.  Improving vascular congestion. Left perihilar and lower lobe atelectasis or infiltrate. Electronically Signed   By: [name] M.D.  On: 10/14/2017 07:48    Assessment/Plan: Beecher Furio is a 72 y.o. male with myeloma, on chemo admitted with increasing sob and found to have LLL infiltrate, influenza and MRSA bacteremia.  Has sacral and heel ulcers with are potential sources of MRSA bacteremia infection.  Course complicated by cytopenias and ARF.  He will need evaluation for endocarditis as well as evaluation by EP for removal of the AICD which is likely seeded.  2/19 - no fevers, Echo neg. [place] 2/18 fu ngtd. Seen by cardiology.  Day 3 vanco, day 2 tamiflu 2/20 - he declined TEE today. No fevers, cr up to 3.55 - seen by nephrology.  Vanco level remains therapeutic at 22. He is more confused today- maybe uremia.   Day 4 of vanco, day 3 of tamiflu  Recommendations Will dc vanco as cr continues to climb. His vanco level remains > 15 so is therapeutic despite last dose given 2/18.  Will plan to start daptomycin once level of vanco < 15 Will add rifampin 300 mg bid given AICD in place.  Will need LFTs weekly. Cont tamiflu x 5 days.   Discussed that will likely plan on leaving ICD in place and suppressing with doxycycline  after a 6 week course of IV abx Discussed with patient, wife.   Thank you very much for the consult. Will follow with you.  Leonel Ramsay   10/15/2017, 2:56 PM

## 2017-10-16 ENCOUNTER — Other Ambulatory Visit: Payer: Medicare Other

## 2017-10-16 DIAGNOSIS — R7881 Bacteremia: Secondary | ICD-10-CM

## 2017-10-16 LAB — BASIC METABOLIC PANEL
Anion gap: 10 (ref 5–15)
BUN: 86 mg/dL — AB (ref 6–20)
CHLORIDE: 110 mmol/L (ref 101–111)
CO2: 17 mmol/L — AB (ref 22–32)
Calcium: 6.6 mg/dL — ABNORMAL LOW (ref 8.9–10.3)
Creatinine, Ser: 3.6 mg/dL — ABNORMAL HIGH (ref 0.61–1.24)
GFR calc Af Amer: 18 mL/min — ABNORMAL LOW (ref >60)
GFR calc non Af Amer: 15 mL/min — ABNORMAL LOW (ref >60)
GLUCOSE: 97 mg/dL (ref 65–99)
Potassium: 5 mmol/L (ref 3.5–5.1)
Sodium: 137 mmol/L (ref 135–145)

## 2017-10-16 LAB — CBC
HEMATOCRIT: 23.1 % — AB (ref 40.0–52.0)
HEMOGLOBIN: 7.5 g/dL — AB (ref 13.0–18.0)
MCH: 22.6 pg — AB (ref 26.0–34.0)
MCHC: 32.6 g/dL (ref 32.0–36.0)
MCV: 69.5 fL — ABNORMAL LOW (ref 80.0–100.0)
Platelets: 110 10*3/uL — ABNORMAL LOW (ref 150–440)
RBC: 3.32 MIL/uL — ABNORMAL LOW (ref 4.40–5.90)
RDW: 20.3 % — ABNORMAL HIGH (ref 11.5–14.5)
WBC: 4.9 10*3/uL (ref 3.8–10.6)

## 2017-10-16 LAB — MAGNESIUM: Magnesium: 2 mg/dL (ref 1.7–2.4)

## 2017-10-16 LAB — GLUCOSE, CAPILLARY
Glucose-Capillary: 93 mg/dL (ref 65–99)
Glucose-Capillary: 108 mg/dL — ABNORMAL HIGH (ref 65–99)
Glucose-Capillary: 138 mg/dL — ABNORMAL HIGH (ref 65–99)
Glucose-Capillary: 100 mg/dL — ABNORMAL HIGH (ref 65–99)

## 2017-10-16 MED ORDER — SENNOSIDES-DOCUSATE SODIUM 8.6-50 MG PO TABS
2.0000 | ORAL_TABLET | Freq: Two times a day (BID) | ORAL | Status: DC
  Administered 2017-10-16: 2 via ORAL
  Filled 2017-10-16 (×2): qty 2

## 2017-10-16 MED ORDER — SODIUM CHLORIDE 0.9 % IV SOLN
6.0000 mg/kg | INTRAVENOUS | Status: DC
  Filled 2017-10-16: qty 10.97

## 2017-10-16 MED ORDER — DAPTOMYCIN 500 MG IV SOLR
550.0000 mg | INTRAVENOUS | Status: DC
  Administered 2017-10-16: 550 mg via INTRAVENOUS
  Filled 2017-10-16: qty 11

## 2017-10-16 NOTE — Progress Notes (Signed)
Hematology/Oncology Consult note [facility]  Telephone:([phone] [phone]  Patient Care Team: [name], MD as PCP - General (Pediatrics)   Name of the patient: Adam Wall  [id number]  1946/02/23   Date of visit: 10/16/2017  Interval history- he is alert but feels fatigued and does appear intermittently confused  ECOG PS- 4   Review of systems- Review of Systems  Constitutional: Positive for malaise/fatigue. Negative for chills, fever and weight loss.  HENT: Negative for congestion, ear discharge and nosebleeds.   Eyes: Negative for blurred vision.  Respiratory: Negative for cough, hemoptysis, sputum production, shortness of breath and wheezing.   Cardiovascular: Negative for chest pain, palpitations, orthopnea and claudication.  Gastrointestinal: Negative for abdominal pain, blood in stool, constipation, diarrhea, heartburn, melena, nausea and vomiting.  Genitourinary: Negative for dysuria, flank pain, frequency, hematuria and urgency.  Musculoskeletal: Positive for back pain. Negative for joint pain and myalgias.  Skin: Negative for rash.  Neurological: Positive for weakness. Negative for dizziness, tingling, focal weakness, seizures and headaches.  Endo/Heme/Allergies: Does not bruise/bleed easily.  Psychiatric/Behavioral: Negative for depression and suicidal ideas. The patient does not have insomnia.       No Known Allergies   Past Medical History:  Diagnosis Date  . AICD (automatic cardioverter/defibrillator) present   . Anemia   . Blockage of coronary artery of heart ([place])   . Cancer ([place])   . CHF (congestive heart failure) ([place])   . Chronic kidney disease 05/22/2017   nephrectomy-right  . Diabetes mellitus without complication ([place])   . Dysrhythmia   . Heart murmur   . Hypertension   . Hypochloremia   . LBBB (left bundle branch block)   . Neuropathy   . Presence of permanent cardiac pacemaker   . Right renal  mass   . 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 CARDIAC SURGERY    . CORONARY ARTERY BYPASS GRAFT    . ENDOBRONCHIAL ULTRASOUND N/A 04/03/2017   Procedure: ENDOBRONCHIAL ULTRASOUND;  Surgeon: Flora Lipps, MD;  Location: [facility] ORS;  Service: Cardiopulmonary;  Laterality: N/A;  . Heart Bypass    . IR KYPHO LUMBAR INC FX REDUCE BONE BX UNI/BIL CANNULATION INC/IMAGING  09/12/2017  . PACEMAKER PLACEMENT     ICD  . TEE WITHOUT CARDIOVERSION N/A 10/15/2017   Procedure: TRANSESOPHAGEAL ECHOCARDIOGRAM (TEE);  Surgeon: Teodoro Spray, MD;  Location: [facility] ORS;  Service: Cardiovascular;  Laterality: N/A;    Social History   Socioeconomic History  . Marital status: Married    Spouse name: Not on file  . Number of children: Not on file  . Years of education: Not on file  . Highest education level: Not on file  Social Needs  . Financial resource strain: Not on file  . Food insecurity - worry: Not on file  . Food insecurity - inability: Not on file  . Transportation needs - medical: Not on file  . Transportation needs - non-medical: Not on file  Occupational History  . Not on file  Tobacco Use  . Smoking status: Former Smoker    Years: 10.00    Types: Cigars    Last attempt to quit: 10/04/1993    Years since quitting: 24.0  . Smokeless tobacco: Never Used  . Tobacco comment: 3 CIGARS PER DAY  Substance and Sexual Activity  . Alcohol use: No  . Drug use: No  . Sexual activity: Not on file  Other Topics Concern  . Not on file  Social History Narrative  . Not on file    Family History  Adopted: Yes  Problem Relation Age of Onset  . Heart failure Paternal Grandmother      Current Facility-Administered Medications:  .  0.9 %  sodium chloride infusion, 250 mL, Intravenous, PRN, Salary, Montell D, MD .  acetaminophen (TYLENOL) tablet 650 mg, 650 mg, Oral, Q4H PRN, Salary, Montell D, MD .  acyclovir (ZOVIRAX) 200 MG capsule 400 mg, 400 mg,  Oral, Daily, Salary, Montell D, MD, 400 mg at 10/14/17 1130 .  benzonatate (TESSALON) capsule 100 mg, 100 mg, Oral, BID PRN, Manuella Ghazi, Vipul, MD .  budesonide (PULMICORT) nebulizer solution 0.25 mg, 0.25 mg, Nebulization, Q6H, Merton Border B, MD, 0.25 mg at 10/16/17 1329 .  calcium-vitamin D (OSCAL WITH D) 500-200 MG-UNIT per tablet 1 tablet, 1 tablet, Oral, BID, Salary, Montell D, MD, 1 tablet at 10/14/17 2113 .  DAPTOmycin (CUBICIN) 550 mg in sodium chloride 0.9 % IVPB, 550 mg, Intravenous, Q48H, Shah, Vipul, MD .  fentaNYL (DURAGESIC - dosed mcg/hr) 12.5 mcg, 12.5 mcg, Transdermal, Q72H, Salary, Montell D, MD, 12.5 mcg at 10/13/17 0130 .  furosemide (LASIX) injection 80 mg, 80 mg, Intravenous, Daily, Wilhelmina Mcardle, MD, 80 mg at 10/16/17 1118 .  heparin injection 5,000 Units, 5,000 Units, Subcutaneous, Q8H, Salary, Montell D, MD, 5,000 Units at 10/16/17 1337 .  hydrALAZINE (APRESOLINE) injection 10-40 mg, 10-40 mg, Intravenous, Q4H PRN, Merton Border B, MD .  insulin aspart (novoLOG) injection 0-20 Units, 0-20 Units, Subcutaneous, TID WC, Salary, Montell D, MD, 4 Units at 10/15/17 1705 .  insulin aspart (novoLOG) injection 0-5 Units, 0-5 Units, Subcutaneous, QHS, Salary, Montell D, MD, 2 Units at 10/13/17 2206 .  insulin detemir (LEVEMIR) injection 3 Units, 3 Units, Subcutaneous, BID, Wilhelmina Mcardle, MD, 3 Units at 10/16/17 1118 .  ipratropium-albuterol (DUONEB) 0.5-2.5 (3) MG/3ML nebulizer solution 3 mL, 3 mL, Nebulization, Q6H, Wilhelmina Mcardle, MD, 3 mL at 10/16/17 1329 .  loperamide (IMODIUM) capsule 2 mg, 2 mg, Oral, Q6H PRN, Salary, Montell D, MD .  mupirocin ointment (BACTROBAN) 2 %, , Topical, Daily, Wieting, Richard, MD .  ondansetron (ZOFRAN) injection 4 mg, 4 mg, Intravenous, Q6H PRN, Salary, Montell D, MD .  oseltamivir (TAMIFLU) capsule 30 mg, 30 mg, Oral, Daily, Salary, Montell D, MD, 30 mg at 10/15/17 1019 .  oxyCODONE (Oxy IR/ROXICODONE) immediate release tablet 5 mg, 5 mg,  Oral, Q4H PRN, Salary, Montell D, MD, 5 mg at 10/16/17 1350 .  rifampin (RIFADIN) capsule 300 mg, 300 mg, Oral, Q12H, Leonel Ramsay, MD, 300 mg at 10/15/17 1554 .  senna-docusate (Senokot-S) tablet 2 tablet, 2 tablet, Oral, BID, Manuella Ghazi, Vipul, MD .  tamsulosin (FLOMAX) capsule 0.4 mg, 0.4 mg, Oral, Daily, Salary, Montell D, MD, 0.4 mg at 10/15/17 1019 .  traZODone (DESYREL) tablet 50 mg, 50 mg, Oral, QHS, Salary, Montell D, MD, 50 mg at 10/14/17 2112 .  vitamin B-12 (CYANOCOBALAMIN) tablet 1,000 mcg, 1,000 mcg, Oral, Daily, Salary, Montell D, MD, 1,000 mcg at 10/14/17 1127 .  vitamin C (ASCORBIC ACID) tablet 500 mg, 500 mg, Oral, Daily, Salary, Montell D, MD, 500 mg at 10/14/17 1129  Physical exam:  Vitals:   10/16/17 0405 10/16/17 0455 10/16/17 0743 10/16/17 1116  BP: (!) 87/62   (!) 133/54  Pulse: (!) 110   72  Resp: 18   18  Temp: 98.4 F (36.9 C)   98.2 F (  36.8 C)  TempSrc: Axillary   Oral  SpO2: 94%  95% 95%  Weight:  198 lb 3.1 oz (89.9 kg)    Height:       Physical Exam  Constitutional: He is oriented to person, place, and time.  He is alert and oriented to self, place and person. Does tend to get confused intermittently. Speech somewhat garbled  HENT:  Head: Normocephalic and atraumatic.  Eyes: EOM are normal. Pupils are equal, round, and reactive to light.  Neck: Normal range of motion.  Cardiovascular: Regular rhythm and normal heart sounds.  tachycardic  Pulmonary/Chest: Effort normal and breath sounds normal.  Abdominal: Soft. Bowel sounds are normal.  Musculoskeletal: He exhibits edema.  Neurological: He is alert and oriented to person, place, and time.  Skin: Skin is warm and dry.     CMP Latest Ref Rng & Units 10/16/2017  Glucose 65 - 99 mg/dL 97  BUN 6 - 20 mg/dL 86(H)  Creatinine 0.61 - 1.24 mg/dL 3.60(H)  Sodium 135 - 145 mmol/L 137  Potassium 3.5 - 5.1 mmol/L 5.0  Chloride 101 - 111 mmol/L 110  CO2 22 - 32 mmol/L 17(L)  Calcium 8.9 - 10.3 mg/dL  6.6(L)  Total Protein 6.5 - 8.1 g/dL -  Total Bilirubin 0.3 - 1.2 mg/dL -  Alkaline Phos 38 - 126 U/L -  AST 15 - 41 U/L -  ALT 17 - 63 U/L -   CBC Latest Ref Rng & Units 10/16/2017  WBC 3.8 - 10.6 K/uL 4.9  Hemoglobin 13.0 - 18.0 g/dL 7.5(L)  Hematocrit 40.0 - 52.0 % 23.1(L)  Platelets 150 - 440 K/uL 110(L)    @IMAGES @  Dg Chest 2 View  Result Date: 10/07/2017 CLINICAL DATA:  Fever, productive cough, pleuritic chest pain, fever. History of multiple myeloma, diabetes, coronary artery disease and previous MI, CHF, former smoker. EXAM: CHEST  2 VIEW COMPARISON:  Portable chest x-ray of July 11, 2018 FINDINGS: The lungs are well-expanded. The interstitial markings are patchy and coarse. There is an infiltrate in the left lower lobe posteriorly. The cardiac silhouette remains enlarged. The central pulmonary vascularity is prominent. The ICD is in stable position. There is calcification in the wall of the aortic arch. The sternal wires are intact. Calcified the observed bony thorax is unremarkable. There is a moderate amount of gas and fluid in the upper abdomen. IMPRESSION: Left lower lobe pneumonia. Patchy interstitial density in both lungs has appeared since the previous study and may reflect a pneumonitis type pattern or less likely pulmonary edema. Followup PA and lateral chest X-ray is recommended in 3-4 weeks following trial of antibiotic therapy to ensure resolution and exclude underlying malignancy. Cardiomegaly.  Previous CABG.  Thoracic aortic atherosclerosis. Electronically Signed   By: David  Martinique M.D.   On: 10/07/2017 14:03   Dg Chest Port 1 View  Result Date: 10/14/2017 CLINICAL DATA:  Respiratory failure EXAM: PORTABLE CHEST 1 VIEW COMPARISON:  10/12/2017 FINDINGS: Left AICD remains in place, unchanged. Prior CABG. Cardiomegaly. Improving vascular congestion. Left perihilar and lower lobe atelectasis or infiltrate noted. No visible effusions or acute bony abnormality.  IMPRESSION: Cardiomegaly.  Improving vascular congestion. Left perihilar and lower lobe atelectasis or infiltrate. Electronically Signed   By: [name] M.D.   On: 10/14/2017 07:48   Dg Chest Portable 1 View  Result Date: 10/12/2017 CLINICAL DATA:  Shortness of breath. EXAM: PORTABLE CHEST 1 VIEW COMPARISON:  Chest x-ray dated October 07, 2017. FINDINGS: Unchanged left chest wall AICD.  Stable cardiomegaly. Mild central vascular congestion. Unchanged retrocardiac opacity. No large pleural effusion. No pneumothorax. No acute osseous abnormality. IMPRESSION: 1. Stable cardiomegaly and mild central vascular congestion. No overt pulmonary edema. 2. Unchanged left lower lobe pneumonia. Electronically Signed   By: Titus Dubin M.D.   On: 10/12/2017 17:54     Assessment and plan- Patient is a 72 y.o. male IgG kappa multiple myeloma currently admitted for HCAP, MRSA bacteremia and influenza   1. Kidney functions continue to trend up due to acute infection and sepsis. He is not a candidate for dialysis. Although his prognosis from multiple myeloma standpoint is not poor, his acute hospitalization has been complicated with multiple infections, worsening kidney functions and his performance status is poor. He did meet with palliative care services and discussed hospice. This would be appropriate in the setting of AKI and uremia. I met with patients wife and encouraged to think about his CODE status. If he were to get uremic, he could have electrolyte disturbances and have a risk of cardiac arrest and worsening respiratory status. My recommendation would be for his family to consider DNR for him. She will talk this further with her son today and discuss with palliative care tomorrow. She does want to give some time and see if he would improve with antibiotics before considering hospice.   2. Pancytopenia- secondary to multiple myeloma, recent velcade and revlimid and infection. Pancytopenia is  improving  Will continue to follow.     Visit Diagnosis 1. Respiratory distress   2. Acute congestive heart failure, unspecified heart failure type ([place])   3. Healthcare-associated pneumonia   4. Respiratory failure ([place])      Dr. Randa Evens, MD, MPH [facility] Pager- [phone] 10/16/2017

## 2017-10-16 NOTE — Progress Notes (Signed)
Daily Progress Note   Patient Name: Adam Wall       Date: 10/16/2017 DOB: 1945-10-28  Age: 72 y.o. MRN#: [id number] Attending Physician: Max Sane, MD Primary Care Physician: [name], MD Admit Date: 10/12/2017  Reason for Consultation/Follow-up: Establishing goals of care  Subjective: Patient with decline. He is lethargic with confusion. He opens his eyes to my voice. Tells me it is not a good day. He is upset and worried about his end of life. He is confused and cannot orient to place or time. Gave him emotional support.  Noted Cr is trending up. Blood cultures are positive for MRSA. Per nephrology he is not a good candidate for long term dialysis as catheter placement would be another source of infection, in addition to his multiple ongoing comorbidities and poor functional status.  Dr. Manuella Ghazi met with patient's spouse and son this morning and discussed poor prognosis. I discussed with patient's spouse via phone and arranged for in person [place] meeting for tomorrow morning at 0830am.   Review of Systems  Unable to perform ROS: Mental status change    Length of Stay: 4  Current Medications: Scheduled Meds:  . acyclovir  400 mg Oral Daily  . budesonide (PULMICORT) nebulizer solution  0.25 mg Nebulization Q6H  . calcium-vitamin D  1 tablet Oral BID  . fentaNYL  12.5 mcg Transdermal Q72H  . furosemide  80 mg Intravenous Daily  . heparin  5,000 Units Subcutaneous Q8H  . insulin aspart  0-20 Units Subcutaneous TID WC  . insulin aspart  0-5 Units Subcutaneous QHS  . insulin detemir  3 Units Subcutaneous BID  . ipratropium-albuterol  3 mL Nebulization Q6H  . mupirocin ointment   Topical Daily  . oseltamivir  30 mg Oral Daily  . rifampin  300 mg Oral Q12H  . senna-docusate   2 tablet Oral BID  . tamsulosin  0.4 mg Oral Daily  . traZODone  50 mg Oral QHS  . vitamin B-12  1,000 mcg Oral Daily  . vitamin C  500 mg Oral Daily    Continuous Infusions: . sodium chloride    . DAPTOmycin (CUBICIN)  IV      PRN Meds: sodium chloride, acetaminophen, benzonatate, hydrALAZINE, loperamide, ondansetron (ZOFRAN) IV, oxyCODONE  Physical Exam  Constitutional: He appears well-developed.  HENT:  Head: Normocephalic and atraumatic.  Cardiovascular:  tachycardic  Pulmonary/Chest:  Increased rr  Neurological:  lethargic  Skin: He is diaphoretic.  Nursing note and vitals reviewed.           Vital Signs: BP (!) 133/54 (BP Location: Left Arm)   Pulse 72   Temp 98.2 F (36.8 C) (Oral)   Resp 18   Ht 6' (1.829 m)   Wt 89.9 kg (198 lb 3.1 oz)   SpO2 95%   BMI 26.88 kg/m  SpO2: SpO2: 95 % O2 Device: O2 Device: Nasal Cannula O2 Flow Rate: O2 Flow Rate (L/min): 3 L/min  Intake/output summary:   Intake/Output Summary (Last 24 hours) at 10/16/2017 1615 Last data filed at 10/16/2017 1344 Gross per 24 hour  Intake 800 ml  Output 350 ml  Net 450 ml   LBM: Last BM Date: (unable to assess) Baseline Weight: Weight: 90.7 kg (200 lb) Most recent weight: Weight: 89.9 kg (198 lb 3.1 oz)       Palliative Assessment/Data: PPS: 20%      Patient Active Problem List   Diagnosis Date Noted  . Respiratory failure ([place])   . Advanced care planning/counseling discussion   . Palliative care by specialist   . Acute respiratory failure with hypoxemia ([place]) 10/12/2017  . Pressure injury of skin 09/12/2017  . Back pain, acute 09/11/2017  . Dysphagia 09/04/2017  . Goals of care, counseling/discussion 08/18/2017  . Multiple myeloma not having achieved remission ([place]) 08/18/2017  . Multiple myeloma ([place]) 08/18/2017  . Adenopathy   . SOBOE (shortness of breath on exertion) 03/13/2017  . Renal mass 03/03/2017  . 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 Iron deficiency anemia  02/22/2017  . Atherosclerosis of native artery of both lower extremities with intermittent claudication ([place]) 02/21/2017  . Atherosclerosis of native arteries of extremity with intermittent claudication ([place]) 07/08/2016  . Hyperlipidemia 07/08/2016  . Coronary atherosclerosis 07/08/2016  . Diabetes ([place]) 07/08/2016  . Bilateral leg edema 05/09/2015  . Biventricular ICD (implantable cardioverter-defibrillator) in place 05/09/2015  . Biventricular implantable cardioverter-defibrillator in situ 04/12/2015  . Acute combined systolic and diastolic congestive heart failure ([place]) 04/11/2015  . Essential hypertension 03/30/2015  . Left carotid bruit 03/30/2015  . Microcytic anemia 03/30/2015  . Weak pulse 03/30/2015  . Mitral valve insufficiency 03/22/2015  . Moderate mitral insufficiency 03/22/2015  . Chronic systolic CHF (congestive heart failure), NYHA class 2 ([place]) 10/25/2014  . Chronic systolic heart failure ([place]) 10/25/2014  . Acute kidney injury (nontraumatic) ([place]) 10/18/2014  . Complicated UTI (urinary tract infection) 10/18/2014  . Pyelonephritis 10/18/2014  . Congestive cardiomyopathy ([place]) 05/11/2014  . Colon polyp 01/01/2013  . Benign prostatic hyperplasia without lower urinary tract symptoms 06/12/2012  . Elevated prostate specific antigen (PSA) 06/12/2012  . Chronic anemia 03/18/2012  . Referral of patient 03/18/2012  . Sepsis due to urinary tract infection ([place]) 03/18/2012  . Sepsis secondary to UTI ([place]) 03/18/2012  . Elevated PSA 03/17/2012  . Hypertension 03/17/2012  . LBBB (left bundle branch block) 03/17/2012  . Left bundle branch block (LBBB) 03/17/2012  . Microalbuminuria 03/17/2012  . Retinopathy 03/17/2012  . Atherosclerosis 05/28/2011    Palliative Care Assessment & Plan   Patient Profile:71 y.o. male  with past medical history of multiple myeloma (previously being tx with revlimid, dex, velcade, on hold d/t acute illness), CHF, CAD, s/p AICD, CKD s/p  nephrectomy, DM, admitted on 10/12/2017 with respiratory distress requiring bipap. Workup has revealed bacteremia +MRSA, AKI on CKD, CXR pulm  congestion LLL pneumonia. He was being treated at [facility] for norovirus and PNA.   He is scheduled for a TEE to evaluate for vegetation around AICD leads or possible endocarditis. He is high risk for AICD extraction. This is his third admission in the last three months. Most recently he was admitted in January for compression fractures and was discharged to [facility] for rehab. Palliative medicine was consulted for "SNF resident with multiple medical problems. Goals of care. Poor candidate for intubation, ACLS or HD"   Assessment/Recommendations/Plan   PMT to meet with patient's spouse and son tomorrow morning  Pt with very poor prognosis- if comfort care path is chosen he would likely be eligible for residential hospice  Prognosis:   Unable to determine  Discharge Planning:  To Be Determined  Care plan was discussed with Dr. Manuella Ghazi.  Thank you for allowing the Palliative Medicine Team to assist in the care of this patient.   Time In: 1530 Time Out: 1555 Total Time 25 mins Prolonged Time Billed no      Greater than 50%  of this time was spent counseling and coordinating care related to the above assessment and plan.  Mariana Kaufman, AGNP-C Palliative Medicine   Please contact Palliative Medicine Team phone at [phone] for questions and concerns.

## 2017-10-16 NOTE — Progress Notes (Signed)
[facility] INFECTIOUS DISEASE PROGRESS NOTE Date of Admission:  10/12/2017     ID: Adam Wall is a 72 y.o. male with  MRSA bacteremia, presumed AICD infection Active Problems:   Acute respiratory failure with hypoxemia ([place])   Respiratory failure ([place])   Advanced care planning/counseling discussion   Palliative care by specialist   Subjective: No fevers, reports feeling a little better. More alert today    ROS  Unable to obtain  Medications:  Antibiotics Given (last 72 hours)    Date/Time Action Medication Dose Rate   10/13/17 2320 New Bag/Given   vancomycin (VANCOCIN) IVPB 1000 mg/200 mL premix 1,000 mg 200 mL/hr   10/14/17 1129 Given   oseltamivir (TAMIFLU) capsule 30 mg 30 mg    10/14/17 1130 Given   acyclovir (ZOVIRAX) 200 MG capsule 400 mg 400 mg    10/15/17 1019 Given   oseltamivir (TAMIFLU) capsule 30 mg 30 mg    10/15/17 1554 Given   rifampin (RIFADIN) capsule 300 mg 300 mg      . acyclovir  400 mg Oral Daily  . budesonide (PULMICORT) nebulizer solution  0.25 mg Nebulization Q6H  . calcium-vitamin D  1 tablet Oral BID  . docusate sodium  100 mg Oral BID  . fentaNYL  12.5 mcg Transdermal Q72H  . furosemide  80 mg Intravenous Daily  . heparin  5,000 Units Subcutaneous Q8H  . insulin aspart  0-20 Units Subcutaneous TID WC  . insulin aspart  0-5 Units Subcutaneous QHS  . insulin detemir  3 Units Subcutaneous BID  . ipratropium-albuterol  3 mL Nebulization Q6H  . mupirocin ointment   Topical Daily  . oseltamivir  30 mg Oral Daily  . rifampin  300 mg Oral Q12H  . tamsulosin  0.4 mg Oral Daily  . traZODone  50 mg Oral QHS  . vitamin B-12  1,000 mcg Oral Daily  . vitamin C  500 mg Oral Daily    Objective: Vital signs in last 24 hours: Temp:  [98.2 F (36.8 C)-98.4 F (36.9 C)] 98.2 F (36.8 C) (02/21 1116) Pulse Rate:  [72-111] 72 (02/21 1116) Resp:  [18] 18 (02/21 1116) BP: (87-133)/(54-62) 133/54 (02/21 1116) SpO2:  [90 %-95 %] 95 % (02/21  1116) Weight:  [89.9 kg (198 lb 3.1 oz)] 89.9 kg (198 lb 3.1 oz) (02/21 0455) Constitutional: He is chronically ill appearing, lethargic but arousable, some myoclonus HENT: anicteric, pale Mouth/Throat: Oropharynx is clear and dry . No oropharyngeal exudate.  Cardiovascular: Normal rate, regular rhythm and normal heart sounds. 2/6 sm AICD in place, non tender Pulmonary/Chest: poor air movement Abdominal: Soft. Bowel sounds are normal. He exhibits no distension. There is no tenderness.  Lymphadenopathy: He has no cervical adenopathy.  Neurological: He is lethargic but arousable and able to answer yes and no questions., some myoclonus and asterix Ext no cce Skin: bil heels with pressure injury and bruising but no open wounds or drainage Psychiatric: flat affect   Lab Results Recent Labs    10/15/17 0710 10/16/17 0553  WBC 3.2* 4.9  HGB 8.0* 7.5*  HCT 24.1* 23.1*  NA 135 137  K 5.1 5.0  CL 107 110  CO2 17* 17*  BUN 85* 86*  CREATININE 3.55* 3.60*    Microbiology: Results for orders placed or performed during the hospital encounter of 10/12/17  Culture, blood (routine x 2)     Status: Abnormal   Collection Time: 10/12/17  5:05 PM  Result Value Ref Range Status  Specimen Description   Final    BLOOD RIGHT ANTECUBITAL Performed at [facility], [address] [place], [place] [address]    Special Requests   Final    BOTTLES DRAWN AEROBIC AND ANAEROBIC Blood Culture results may not be optimal due to an excessive volume of blood received in culture bottles Performed at [facility], [address], [place], [place] [address]    Culture  Setup Time   Final    GRAM POSITIVE COCCI IN BOTH AEROBIC AND ANAEROBIC BOTTLES CRITICAL RESULT CALLED TO, READ BACK BY AND VERIFIED WITH: JASON ROBBINS AT 5093 ON 10/13/2017 JJB Performed at [facility], [place] [address], [place], [place] [address]    Culture METHICILLIN RESISTANT STAPHYLOCOCCUS AUREUS (A)  Final    Report Status 10/15/2017 FINAL  Final   Organism ID, Bacteria METHICILLIN RESISTANT STAPHYLOCOCCUS AUREUS  Final      Susceptibility   Methicillin resistant staphylococcus aureus - MIC*    CIPROFLOXACIN >=8 RESISTANT Resistant     ERYTHROMYCIN >=8 RESISTANT Resistant     GENTAMICIN <=0.5 SENSITIVE Sensitive     OXACILLIN >=4 RESISTANT Resistant     TETRACYCLINE <=1 SENSITIVE Sensitive     VANCOMYCIN 1 SENSITIVE Sensitive     TRIMETH/SULFA <=10 SENSITIVE Sensitive     CLINDAMYCIN >=8 RESISTANT Resistant     RIFAMPIN <=0.5 SENSITIVE Sensitive     Inducible Clindamycin NEGATIVE Sensitive     * METHICILLIN RESISTANT STAPHYLOCOCCUS AUREUS  Culture, blood (routine x 2)     Status: Abnormal   Collection Time: 10/12/17  5:05 PM  Result Value Ref Range Status   Specimen Description   Final    BLOOD RIGHT HAND Performed at [facility], [address], [place], [place] [address]    Special Requests   Final    BOTTLES DRAWN AEROBIC AND ANAEROBIC Blood Culture adequate volume Performed at [facility], [address] [place], [place] [address]    Culture  Setup Time   Final    GRAM POSITIVE COCCI IN BOTH AEROBIC AND ANAEROBIC BOTTLES CRITICAL RESULT CALLED TO, READ BACK BY AND VERIFIED WITH: JASON ROBBINS ATR 1514 ON 10/13/2017 JJB Performed at [facility], [address] [place], [place] [address]    Culture (A)  Final    STAPHYLOCOCCUS AUREUS SUSCEPTIBILITIES PERFORMED ON PREVIOUS CULTURE WITHIN THE LAST 5 DAYS. Performed at [facility], [place] [address] [place], [place] [address]    Report Status 10/15/2017 FINAL  Final  Blood Culture ID Panel (Reflexed)     Status: Abnormal   Collection Time: 10/12/17  5:05 PM  Result Value Ref Range Status   Enterococcus species NOT DETECTED NOT DETECTED Final   Listeria monocytogenes NOT DETECTED NOT DETECTED Final   Staphylococcus species DETECTED (A) NOT DETECTED Final    Comment: CRITICAL RESULT  CALLED TO, READ BACK BY AND VERIFIED WITH: JASON ROBBINS AT 1514 ON 10/13/2017 JJB    Staphylococcus aureus DETECTED (A) NOT DETECTED Final    Comment: Methicillin (oxacillin)-resistant Staphylococcus aureus (MRSA). MRSA is predictably resistant to beta-lactam antibiotics (except ceftaroline). Preferred therapy is vancomycin unless clinically contraindicated. Patient requires contact precautions if  hospitalized. CRITICAL RESULT CALLED TO, READ BACK BY AND VERIFIED WITH: JASON ROBBINS AT 4193 ON 10/13/2017 JJB    Methicillin resistance DETECTED (A) NOT DETECTED Final    Comment: CRITICAL RESULT CALLED TO, READ BACK BY AND VERIFIED WITH: JASON ROBBINS AT 7902 ON 10/13/2017 JJB  Streptococcus species NOT DETECTED NOT DETECTED Final   Streptococcus agalactiae NOT DETECTED NOT DETECTED Final   Streptococcus pneumoniae NOT DETECTED NOT DETECTED Final   Streptococcus pyogenes NOT DETECTED NOT DETECTED Final   Acinetobacter baumannii NOT DETECTED NOT DETECTED Final   Enterobacteriaceae species NOT DETECTED NOT DETECTED Final   Enterobacter cloacae complex NOT DETECTED NOT DETECTED Final   Escherichia coli NOT DETECTED NOT DETECTED Final   Klebsiella oxytoca NOT DETECTED NOT DETECTED Final   Klebsiella pneumoniae NOT DETECTED NOT DETECTED Final   Proteus species NOT DETECTED NOT DETECTED Final   Serratia marcescens NOT DETECTED NOT DETECTED Final   Haemophilus influenzae NOT DETECTED NOT DETECTED Final   Neisseria meningitidis NOT DETECTED NOT DETECTED Final   Pseudomonas aeruginosa NOT DETECTED NOT DETECTED Final   Candida albicans NOT DETECTED NOT DETECTED Final   Candida glabrata NOT DETECTED NOT DETECTED Final   Candida krusei NOT DETECTED NOT DETECTED Final   Candida parapsilosis NOT DETECTED NOT DETECTED Final   Candida tropicalis NOT DETECTED NOT DETECTED Final    Comment: Performed at [facility], [address] [place], [place] [address]  MRSA PCR Screening     Status:  Abnormal   Collection Time: 10/12/17 11:04 PM  Result Value Ref Range Status   MRSA by PCR POSITIVE (A) NEGATIVE Final    Comment:        The GeneXpert MRSA Assay (FDA approved for NASAL specimens only), is one component of a comprehensive MRSA colonization surveillance program. It is not intended to diagnose MRSA infection nor to guide or monitor treatment for MRSA infections. RESULT CALLED TO, READ BACK BY AND VERIFIED WITH: TONI WALKER AT 0052 10/13/17.PMH Performed at [facility] [facility], [address] [place], [place] [address]   Culture, blood (single) w Reflex to ID Panel     Status: None (Preliminary result)   Collection Time: 10/13/17  4:01 PM  Result Value Ref Range Status   Specimen Description BLOOD Blood Culture adequate volume  Final   Special Requests BLOOD RIGHT HAND  Final   Culture  Setup Time   Final    GRAM POSITIVE COCCI ANAEROBIC BOTTLE ONLY CRITICAL RESULT CALLED TO, READ BACK BY AND VERIFIED WITH: Chinita Greenland 10/16/17 [place] Performed at [facility], [address] [place], [place] [address]    Culture GRAM POSITIVE COCCI  Final   Report Status PENDING  Incomplete    Studies/Results: No results found.  Assessment/Plan: Derian Dimalanta is a 72 y.o. male with myeloma, on chemo admitted with increasing sob and found to have LLL infiltrate, influenza and MRSA bacteremia.  Has sacral and heel ulcers with are potential sources of MRSA bacteremia infection.  Course complicated by cytopenias and ARF.  He will need evaluation for endocarditis as well as evaluation by EP for removal of the AICD which is likely seeded.  2/19 - no fevers, Echo neg. [place] 2/18 fu ngtd. Seen by cardiology.  Day 3 vanco, day 2 tamiflu 2/20 - he declined TEE today. No fevers, cr up to 3.55 - seen by nephrology.  Vanco level remains therapeutic at 22. He is more confused today- maybe uremia.   2/21 - no fevers, fu cx + GPC but no biofire ID yet.  Cr up to  3.6 Day 5 of vanco, day 4 of tamiflu  Recommendations We stopped vanco as cr continues to climb. His vanco level remains > 15 so is therapeutic despite last dose given 2/18.  Will plan  to start daptomycin once level of vanco < 15 Will cont rifampin 300 mg bid given AICD in place.  Will need LFTs weekly. Cont tamiflu x 5 days.   He will need IV access for long term 6 week IV course, however will hold on placement since fu BCX + GPC. Will repeat [place] today. Will need to discuss with renal IV access if he is an eventual HD candidate would need Neck IJ line instead of PICC>  Discussed that will likely plan on leaving ICD in place and suppressing with doxycycline  after a 6 week course of IV abx Discussed with patient.   Thank you very much for the consult. Will follow with you.  Leonel Ramsay   10/16/2017, 11:43 AM

## 2017-10-16 NOTE — Progress Notes (Signed)
Patient awake but confused. Refused all his medications. Tried to crush and give with applesauce, patient spit back out. MD notified. Will continue to monitor patient.

## 2017-10-16 NOTE — Progress Notes (Signed)
PHARMACY - PHYSICIAN COMMUNICATION CRITICAL VALUE ALERT - BLOOD CULTURE IDENTIFICATION (BCID)  Adam Wall is an 72 y.o. male who presented to [facility] on 10/12/2017 with a chief complaint of respiratory distress  Assessment:  Patient has a MRSA bacteremia s/t PNA and has ICD placed (no plans to take it out)  Name of physician (or Provider) Contacted: Manuella Ghazi  Current antibiotics: was on vancomycin -- will be started on daptomycin once VR < 15 mcg/mL (ID following)  Changes to prescribed antibiotics recommended:  Patient is on recommended antibiotics - No changes needed  Results for orders placed or performed during the hospital encounter of 10/12/17  Blood Culture ID Panel (Reflexed) (Collected: 10/12/2017  5:05 PM)  Result Value Ref Range   Enterococcus species NOT DETECTED NOT DETECTED   Listeria monocytogenes NOT DETECTED NOT DETECTED   Staphylococcus species DETECTED (A) NOT DETECTED   Staphylococcus aureus DETECTED (A) NOT DETECTED   Methicillin resistance DETECTED (A) NOT DETECTED   Streptococcus species NOT DETECTED NOT DETECTED   Streptococcus agalactiae NOT DETECTED NOT DETECTED   Streptococcus pneumoniae NOT DETECTED NOT DETECTED   Streptococcus pyogenes NOT DETECTED NOT DETECTED   Acinetobacter baumannii NOT DETECTED NOT DETECTED   Enterobacteriaceae species NOT DETECTED NOT DETECTED   Enterobacter cloacae complex NOT DETECTED NOT DETECTED   Escherichia coli NOT DETECTED NOT DETECTED   Klebsiella oxytoca NOT DETECTED NOT DETECTED   Klebsiella pneumoniae NOT DETECTED NOT DETECTED   Proteus species NOT DETECTED NOT DETECTED   Serratia marcescens NOT DETECTED NOT DETECTED   Haemophilus influenzae NOT DETECTED NOT DETECTED   Neisseria meningitidis NOT DETECTED NOT DETECTED   Pseudomonas aeruginosa NOT DETECTED NOT DETECTED   Candida albicans NOT DETECTED NOT DETECTED   Candida glabrata NOT DETECTED NOT DETECTED   Candida krusei NOT DETECTED NOT DETECTED   Candida  parapsilosis NOT DETECTED NOT DETECTED   Candida tropicalis NOT DETECTED NOT DETECTED   Tobie Lords, PharmD, BCPS Clinical Pharmacist 10/16/2017

## 2017-10-16 NOTE — Progress Notes (Signed)
[place] rounding on unit. Patient on precautions. Staff with patient. CH provided silent prayer. [place] available for consult upon request.

## 2017-10-16 NOTE — Progress Notes (Signed)
Family Meeting Note  Advance Directive:yes  Today a meeting took place with the Patient and spouse.  The following clinical team members were present during this meeting:MD and RN  The following were discussed:Patient's diagnosis:   71 y.o. male  with past medical history of multiple myeloma (previously being tx with revlimid, dex, velcade, on hold d/t acute illness), CHF, CAD, s/p AICD, CKD s/p nephrectomy, DM, admitted on 10/12/2017 with respiratory distress requiring bipap. Workup has revealed bacteremia +MRSA, AKI on CKD, CXR pulm congestion LLL pneumonia. He was being treated at [facility] for norovirus and PNA.   This is his third readmission in the last three months. Most recently he was admitted in January for compression fractures and was discharged to [facility] for rehab.   Patient's progosis: < 6 months and Goals for treatment: Full Code, wife sort of agrees with comfort care at home but would like to discuss with son.  Additional follow-up to be provided: Continue Palliative care discussion. He seems appropriate for Palliative care services at home, wife not happy with [facility] where he was placed before as he contracted viral infection while there.   Time spent during discussion:20 minutes   , MD  

## 2017-10-16 NOTE — Progress Notes (Signed)
Sacrum wound cleansed and mupirocin ointment applied. Dressing changed as well.

## 2017-10-16 NOTE — Progress Notes (Signed)
Patient ID: Adam Wall, male   DOB: Jul 19, 1946, 73 y.o.   MRN: [id number]  [employer] [facility] PROGRESS NOTE  Adam Wall WYO:[id number] DOB: 1946-06-15 DOA: 10/12/2017 PCP: [name], MD  HPI/Subjective: Confused, wife at bedside  Objective: Vitals:   10/16/17 0743 10/16/17 1116  BP:  (!) 133/54  Pulse:  72  Resp:  18  Temp:  98.2 F (36.8 C)  SpO2: 95% 95%    Filed Weights   10/14/17 2143 10/15/17 0500 10/16/17 0455  Weight: 91 kg (200 lb 9.6 oz) 91.4 kg (201 lb 9.6 oz) 89.9 kg (198 lb 3.1 oz)    ROS: confused so unable to get ROS Exam: Physical Exam  Constitutional: He is oriented to person, place, and time. He is uncooperative.  confused  HENT:  Nose: No mucosal edema.  Mouth/Throat: No oropharyngeal exudate or posterior oropharyngeal edema.  Eyes: Conjunctivae, EOM and lids are normal. Pupils are equal, round, and reactive to light.  Neck: No JVD present. Carotid bruit is not present. No edema present. No thyroid mass and no thyromegaly present.  Cardiovascular: S1 normal and S2 normal. Exam reveals no gallop.  No murmur heard. Pulses:      Dorsalis pedis pulses are 2+ on the right side, and 2+ on the left side.  Respiratory: No respiratory distress. He has decreased breath sounds in the right lower field and the left lower field. He has no wheezes. He has no rhonchi. He has no rales.  GI: Soft. Bowel sounds are normal. There is no tenderness.  Musculoskeletal:       Right ankle: He exhibits swelling.       Left ankle: He exhibits swelling.  Lymphadenopathy:    He has no cervical adenopathy.  Neurological: He is oriented to person, place, and time. No cranial nerve deficit.  Skin: Skin is warm. Nails show no clubbing.  Bilateral heel decubiti with discoloration of the skin bilaterally.  Stage II decubitus ulcer on buttock  Psychiatric: He has a normal mood and affect.      Data Reviewed: Basic Metabolic Panel: Recent Labs  Lab  10/12/17 1640 10/13/17 0459 10/14/17 0533 10/15/17 0710 10/16/17 0553  NA 141 141 136 135 137  K 4.1 4.0 4.4 5.1 5.0  CL 113* 113* 108 107 110  CO2 20* 17* 17* 17* 17*  GLUCOSE 196* 262* 256* 209* 97  BUN 53* 57* 74* 85* 86*  CREATININE 2.54* 2.52* 3.13* 3.55* 3.60*  CALCIUM 7.0* 6.9* 6.6* 6.7* 6.6*  MG  --  1.6*  --   --  2.0   Liver Function Tests: Recent Labs  Lab 10/12/17 1640 10/15/17 0710  AST 65* 38  ALT 41 32  ALKPHOS 61 63  BILITOT 0.8 0.8  PROT 5.6* 5.2*  ALBUMIN 1.9* 1.8*   CBC: Recent Labs  Lab 10/12/17 1640 10/13/17 0459 10/14/17 0533 10/15/17 0710 10/16/17 0553  WBC 1.7* 2.4* 2.9* 3.2* 4.9  NEUTROABS 1.3*  --   --   --   --   HGB 8.0* 7.8* 7.9* 8.0* 7.5*  HCT 24.9* 23.7* 24.1* 24.1* 23.1*  MCV 70.4* 69.7* 70.1* 69.6* 69.5*  PLT 71* 71* 87* 107* 110*   Cardiac Enzymes: Recent Labs  Lab 10/12/17 1640 10/15/17 1821  CKTOTAL  --  263  TROPONINI 1.22*  --    BNP (last 3 results) Recent Labs    09/04/17 1325 09/10/17 2113 10/12/17 1640  BNP 245.0* 137.0* 534.0*     CBG: Recent Labs  Lab 10/15/17 1611 10/15/17 2113 10/16/17 0733 10/16/17 1129 10/16/17 1616  GLUCAP 165* 99 93 108* 138*    Recent Results (from the past 240 hour(s))  Culture, blood (routine x 2)     Status: Abnormal   Collection Time: 10/12/17  5:05 PM  Result Value Ref Range Status   Specimen Description   Final    BLOOD RIGHT ANTECUBITAL Performed at [facility], [address] [place], [place] [address]    Special Requests   Final    BOTTLES DRAWN AEROBIC AND ANAEROBIC Blood Culture results may not be optimal due to an excessive volume of blood received in culture bottles Performed at [facility], [address] [place], [place] [address]    Culture  Setup Time   Final    GRAM POSITIVE COCCI IN BOTH AEROBIC AND ANAEROBIC BOTTLES CRITICAL RESULT CALLED TO, READ BACK BY AND VERIFIED WITH: JASON ROBBINS AT 4742 ON 10/13/2017  [place] Performed at [facility], [place] [address], [place], [place] [address]    Culture METHICILLIN RESISTANT STAPHYLOCOCCUS AUREUS (A)  Final   Report Status 10/15/2017 FINAL  Final   Organism ID, Bacteria METHICILLIN RESISTANT STAPHYLOCOCCUS AUREUS  Final      Susceptibility   Methicillin resistant staphylococcus aureus - MIC*    CIPROFLOXACIN >=8 RESISTANT Resistant     ERYTHROMYCIN >=8 RESISTANT Resistant     GENTAMICIN <=0.5 SENSITIVE Sensitive     OXACILLIN >=4 RESISTANT Resistant     TETRACYCLINE <=1 SENSITIVE Sensitive     VANCOMYCIN 1 SENSITIVE Sensitive     TRIMETH/SULFA <=10 SENSITIVE Sensitive     CLINDAMYCIN >=8 RESISTANT Resistant     RIFAMPIN <=0.5 SENSITIVE Sensitive     Inducible Clindamycin NEGATIVE Sensitive     * METHICILLIN RESISTANT STAPHYLOCOCCUS AUREUS  Culture, blood (routine x 2)     Status: Abnormal   Collection Time: 10/12/17  5:05 PM  Result Value Ref Range Status   Specimen Description   Final    BLOOD RIGHT HAND Performed at [facility], [address] [place], [place] [address]    Special Requests   Final    BOTTLES DRAWN AEROBIC AND ANAEROBIC Blood Culture adequate volume Performed at [facility], [address] [place], [place] [address]    Culture  Setup Time   Final    GRAM POSITIVE COCCI IN BOTH AEROBIC AND ANAEROBIC BOTTLES CRITICAL RESULT CALLED TO, READ BACK BY AND VERIFIED WITH: JASON ROBBINS ATR 1514 ON 10/13/2017 [place] Performed at [facility], [address] [place], [place] [address]    Culture (A)  Final    STAPHYLOCOCCUS AUREUS SUSCEPTIBILITIES PERFORMED ON PREVIOUS CULTURE WITHIN THE LAST 5 DAYS. Performed at [facility], [place] [address] [place], [place] [address]    Report Status 10/15/2017 FINAL  Final  Blood Culture ID Panel (Reflexed)     Status: Abnormal   Collection Time: 10/12/17  5:05 PM  Result Value Ref Range Status   Enterococcus species NOT DETECTED NOT DETECTED Final    Listeria monocytogenes NOT DETECTED NOT DETECTED Final   Staphylococcus species DETECTED (A) NOT DETECTED Final    Comment: CRITICAL RESULT CALLED TO, READ BACK BY AND VERIFIED WITH: JASON ROBBINS AT 1514 ON 10/13/2017 JJB    Staphylococcus aureus DETECTED (A) NOT DETECTED Final    Comment: Methicillin (oxacillin)-resistant Staphylococcus aureus (MRSA). MRSA is predictably resistant to beta-lactam antibiotics (except ceftaroline). Preferred therapy is vancomycin unless clinically contraindicated. Patient requires  contact precautions if  hospitalized. CRITICAL RESULT CALLED TO, READ BACK BY AND VERIFIED WITH: JASON ROBBINS AT 1962 ON 10/13/2017 JJB    Methicillin resistance DETECTED (A) NOT DETECTED Final    Comment: CRITICAL RESULT CALLED TO, READ BACK BY AND VERIFIED WITH: JASON ROBBINS AT 2297 ON 10/13/2017 JJB    Streptococcus species NOT DETECTED NOT DETECTED Final   Streptococcus agalactiae NOT DETECTED NOT DETECTED Final   Streptococcus pneumoniae NOT DETECTED NOT DETECTED Final   Streptococcus pyogenes NOT DETECTED NOT DETECTED Final   Acinetobacter baumannii NOT DETECTED NOT DETECTED Final   Enterobacteriaceae species NOT DETECTED NOT DETECTED Final   Enterobacter cloacae complex NOT DETECTED NOT DETECTED Final   Escherichia coli NOT DETECTED NOT DETECTED Final   Klebsiella oxytoca NOT DETECTED NOT DETECTED Final   Klebsiella pneumoniae NOT DETECTED NOT DETECTED Final   Proteus species NOT DETECTED NOT DETECTED Final   Serratia marcescens NOT DETECTED NOT DETECTED Final   Haemophilus influenzae NOT DETECTED NOT DETECTED Final   Neisseria meningitidis NOT DETECTED NOT DETECTED Final   Pseudomonas aeruginosa NOT DETECTED NOT DETECTED Final   Candida albicans NOT DETECTED NOT DETECTED Final   Candida glabrata NOT DETECTED NOT DETECTED Final   Candida krusei NOT DETECTED NOT DETECTED Final   Candida parapsilosis NOT DETECTED NOT DETECTED Final   Candida tropicalis NOT DETECTED  NOT DETECTED Final    Comment: Performed at [facility], [address] [place], [place] [address]  MRSA PCR Screening     Status: Abnormal   Collection Time: 10/12/17 11:04 PM  Result Value Ref Range Status   MRSA by PCR POSITIVE (A) NEGATIVE Final    Comment:        The GeneXpert MRSA Assay (FDA approved for NASAL specimens only), is one component of a comprehensive MRSA colonization surveillance program. It is not intended to diagnose MRSA infection nor to guide or monitor treatment for MRSA infections. RESULT CALLED TO, READ BACK BY AND VERIFIED WITH: TONI WALKER AT 0052 10/13/17.PM[place] Performed at [facility], [address] [place], [place] [address]   Culture, blood (single) w Reflex to ID Panel     Status: None (Preliminary result)   Collection Time: 10/13/17  4:01 PM  Result Value Ref Range Status   Specimen Description BLOOD Blood Culture adequate volume  Final   Special Requests BLOOD RIGHT HAND  Final   Culture  Setup Time   Final    GRAM POSITIVE COCCI ANAEROBIC BOTTLE ONLY CRITICAL RESULT CALLED TO, READ BACK BY AND VERIFIED WITH: Chinita Greenland 10/16/17 [place] Performed at [facility], [address], [place], [place] [address]    Culture [facility] POSITIVE COCCI  Final   Report Status PENDING  Incomplete     Studies: No results found.  Scheduled Meds: . acyclovir  400 mg Oral Daily  . budesonide (PULMICORT) nebulizer solution  0.25 mg Nebulization Q6H  . calcium-vitamin D  1 tablet Oral BID  . fentaNYL  12.5 mcg Transdermal Q72H  . furosemide  80 mg Intravenous Daily  . heparin  5,000 Units Subcutaneous Q8H  . insulin aspart  0-20 Units Subcutaneous TID WC  . insulin aspart  0-5 Units Subcutaneous QHS  . insulin detemir  3 Units Subcutaneous BID  . ipratropium-albuterol  3 mL Nebulization Q6H  . mupirocin ointment   Topical Daily  . oseltamivir  30 mg Oral Daily  . rifampin  300 mg Oral Q12H  . senna-docusate  2 tablet  Oral BID  . tamsulosin  0.4 mg Oral Daily  . traZODone  50 mg Oral QHS  . vitamin B-12  1,000 mcg Oral Daily  . vitamin C  500 mg Oral Daily   Continuous Infusions: . sodium chloride    . DAPTOmycin (CUBICIN)  IV Stopped (10/16/17 1715)    Assessment/Plan: Adam Presser Woodsis a 72 y.o.malewith myeloma, on chemo admitted with increasing soband found to have LLL infiltrate, influenza and MRSA bacteremia.Has sacral and heel ulcerswith are potential sources of MRSA bacteremia infection.Course complicated by cytopenias and ARF. He will need evaluation for endocarditis as well as evaluation by EP for removal of the AICD which is likely seeded.   1. Clinical sepsis, healthcare acquired pneumonia.  Positive blood cultures with staph aureus with methicillin resistance.  Await final sensitivities.  Infectious disease consultation appreciated.  TEE for tomorrow as he refused it today.   The patient does have a defibrillator and cardiology at this point does not want to take this out.   - day 4 of 5 tamiflu - continue daptomycin and rifampin per ID - plan on leaving ICD in place and suppressing with doxycycline after a 6 week course of IV abx 2. Acute hypoxic respiratory failure.  Patient off BiPAP and now on nasal cannula. 3. Influenza A positive on Tamiflu.  4. Acute kidney injury on chronic kidney disease stage III. Creatinine went up to 3.60, if family chooses full care, he will likely need HD 5. Chronic multiple myeloma with pancytopenia.  Last chemotherapy February 4th. Onco c/s pending 6. Chronic heel and sacral decubiti.  Local wound care 7. Hypomagnesemia replaced 8. Recent lumbar spine compression fracture with interventional radiology kyphoplasty on last hospitalization. 9. Anemia of chronic disease 10. Atrial fibrillation with rapid ventricular response on Coreg 11. BPH on Flomax 12. Type 2 diabetes mellitus on sliding scale and low-dose Levemir 13. Urinary retention Foley is  in place  Code Status:     Code Status Orders  (From admission, onward)        Start     Ordered   10/12/17 2157  Full code  Continuous     10/12/17 2156    Code Status History    Date Active Date Inactive Code Status Order ID Comments User Context   09/11/2017 03:08 09/16/2017 20:28 Full Code [id number]  Amelia Jo, MD Inpatient   09/04/2017 17:55 09/08/2017 18:54 Full Code [id number]  [name], MD ED     Family Communication: wife at the bedside Disposition Plan: Home with Hospice if family is in agreement  Consultants:  Infectious disease  Antibiotics:  Daptomycin  Rifampin  Time spent: 25 minutes  Caydn Justen [employer]

## 2017-10-16 NOTE — Progress Notes (Signed)
Pharmacy BCID  Blood cx: single set: 1 of 2 bottles (anaerobic)positive for Gram Positive cocci; no BCID (per Lab, patient has previous Blood cxs in which 4 of 4 bottles + w/ MRSA)  Called information to Clinical Pharmacist Georgette Shell PharmD Clinical Pharmacist 10/16/2017

## 2017-10-16 NOTE — Care Management (Signed)
Palliative will be meeting with patient's wife in the morning to further discuss goals of care.  Patient with multi organ failure and per attending, prognosis for recovery is poor.  He remains a full code.  There is discussion of hospice and whether patient's wife/family could meet care demands. There is discussion as to whether patient would meet hospice medical facility criteria.  At present he remains full code.  Wife is in the process of discussing this with other members of the family

## 2017-10-16 NOTE — Progress Notes (Signed)
[facility], [place] 10/16/17  Subjective:   Patient known to our practice from previous admission He his known to have a IgG kappa multiple myeloma and is undergoing chemotherapy with Velcade at [facility] He also has history of renal cell cancer and is status post right nephrectomy He was hospitalized in January for lumbar spine compression fracture, symptomatic anemia, acute cystitis and acute renal failure.  He presented to the emergency room [facility] due to respiratory distress.  Upon arrival, his sats were 88% on nonrebreather and he was placed on CPAP.  He was admitted for treatment of left lower lobe, congestive heart failure exacerbation pneumonia  MRSA bacteremia He tested positive for influenza A  Hospital course is complicated by acute respiratory failure requiring continuous BiPAP and worsening renal filaure He also has a stage III pressure ulcer injury to coccyx and bilateral upper buttocks  Patient is confused at present and is not able to provide any meaningful information  Objective:  Vital signs in last 24 hours:  Temp:  [98.2 F (36.8 C)-98.4 F (36.9 C)] 98.4 F (36.9 C) (02/21 0405) Pulse Rate:  [72-111] 110 (02/21 0405) Resp:  [18] 18 (02/21 0405) BP: (87-98)/(54-62) 87/62 (02/21 0405) SpO2:  [90 %-95 %] 95 % (02/21 0743) Weight:  [89.9 kg (198 lb 3.1 oz)] 89.9 kg (198 lb 3.1 oz) (02/21 0455)  Weight change: -1.091 kg (-6.5 oz) Filed Weights   10/14/17 2143 10/15/17 0500 10/16/17 0455  Weight: 91 kg (200 lb 9.6 oz) 91.4 kg (201 lb 9.6 oz) 89.9 kg (198 lb 3.1 oz)    Intake/Output:    Intake/Output Summary (Last 24 hours) at 10/16/2017 3570 Last data filed at 10/15/2017 1851 Gross per 24 hour  Intake 3 ml  Output 350 ml  Net -347 ml     Physical Exam: General: NAD, laying in bed  HEENT Anicteric, moist oral mucus membranes  Neck supple  Pulm/lungs Normal breathing effort  CVS/Heart No rub ,  CABG scar, AICD in place left chest   Abdomen:  Soft, non tender  Extremities: Trace edema,   Neurologic: Alert, oriented only to self, delirious  Skin:  feet in soft support          Basic Metabolic Panel:  Recent Labs  Lab 10/12/17 1640 10/13/17 0459 10/14/17 0533 10/15/17 0710 10/16/17 0553  NA 141 141 136 135 137  K 4.1 4.0 4.4 5.1 5.0  CL 113* 113* 108 107 110  CO2 20* 17* 17* 17* 17*  GLUCOSE 196* 262* 256* 209* 97  BUN 53* 57* 74* 85* 86*  CREATININE 2.54* 2.52* 3.13* 3.55* 3.60*  CALCIUM 7.0* 6.9* 6.6* 6.7* 6.6*  MG  --  1.6*  --   --  2.0     CBC: Recent Labs  Lab 10/12/17 1640 10/13/17 0459 10/14/17 0533 10/15/17 0710 10/16/17 0553  WBC 1.7* 2.4* 2.9* 3.2* 4.9  NEUTROABS 1.3*  --   --   --   --   HGB 8.0* 7.8* 7.9* 8.0* 7.5*  HCT 24.9* 23.7* 24.1* 24.1* 23.1*  MCV 70.4* 69.7* 70.1* 69.6* 69.5*  PLT 71* 71* 87* 107* 110*      Lab Results  Component Value Date   HEPBSAG Negative 09/05/2017   HEPBSAB Non Reactive 09/05/2017   HEPBIGM Negative 09/05/2017      Microbiology:  Recent Results (from the past 240 hour(s))  Culture, blood (routine x 2)     Status: Abnormal   Collection Time:  10/12/17  5:05 PM  Result Value Ref Range Status   Specimen Description   Final    BLOOD RIGHT ANTECUBITAL Performed at [facility], [address] [place], [place] [address]    Special Requests   Final    BOTTLES DRAWN AEROBIC AND ANAEROBIC Blood Culture results may not be optimal due to an excessive volume of blood received in culture bottles Performed at [facility], [address] [place], [place] [address]    Culture  Setup Time   Final    GRAM POSITIVE COCCI IN BOTH AEROBIC AND ANAEROBIC BOTTLES CRITICAL RESULT CALLED TO, READ BACK BY AND VERIFIED WITH: JASON ROBBINS AT 4270 ON 10/13/2017 JJB Performed at [facility], [place] [address], [place], [place] [address]    Culture METHICILLIN RESISTANT STAPHYLOCOCCUS AUREUS (A)   Final   Report Status 10/15/2017 FINAL  Final   Organism ID, Bacteria METHICILLIN RESISTANT STAPHYLOCOCCUS AUREUS  Final      Susceptibility   Methicillin resistant staphylococcus aureus - MIC*    CIPROFLOXACIN >=8 RESISTANT Resistant     ERYTHROMYCIN >=8 RESISTANT Resistant     GENTAMICIN <=0.5 SENSITIVE Sensitive     OXACILLIN >=4 RESISTANT Resistant     TETRACYCLINE <=1 SENSITIVE Sensitive     VANCOMYCIN 1 SENSITIVE Sensitive     TRIMETH/SULFA <=10 SENSITIVE Sensitive     CLINDAMYCIN >=8 RESISTANT Resistant     RIFAMPIN <=0.5 SENSITIVE Sensitive     Inducible Clindamycin NEGATIVE Sensitive     * METHICILLIN RESISTANT STAPHYLOCOCCUS AUREUS  Culture, blood (routine x 2)     Status: Abnormal   Collection Time: 10/12/17  5:05 PM  Result Value Ref Range Status   Specimen Description   Final    BLOOD RIGHT HAND Performed at [facility], [address] [place], [place] [address]    Special Requests   Final    BOTTLES DRAWN AEROBIC AND ANAEROBIC Blood Culture adequate volume Performed at [facility], [address] [place], [place] [address]    Culture  Setup Time   Final    GRAM POSITIVE COCCI IN BOTH AEROBIC AND ANAEROBIC BOTTLES CRITICAL RESULT CALLED TO, READ BACK BY AND VERIFIED WITH: JASON ROBBINS ATR 1514 ON 10/13/2017 JJB Performed at [facility], [address] [place], [place] [address]    Culture (A)  Final    STAPHYLOCOCCUS AUREUS SUSCEPTIBILITIES PERFORMED ON PREVIOUS CULTURE WITHIN THE LAST 5 DAYS. Performed at [facility], [place] [address] [place],  [address]    Report Status 10/15/2017 FINAL  Final  Blood Culture ID Panel (Reflexed)     Status: Abnormal   Collection Time: 10/12/17  5:05 PM  Result Value Ref Range Status   Enterococcus species NOT DETECTED NOT DETECTED Final   Listeria monocytogenes NOT DETECTED NOT DETECTED Final   Staphylococcus species DETECTED (A) NOT DETECTED Final    Comment: CRITICAL RESULT  CALLED TO, READ BACK BY AND VERIFIED WITH: JASON ROBBINS AT 1514 ON 10/13/2017 JJB    Staphylococcus aureus DETECTED (A) NOT DETECTED Final    Comment: Methicillin (oxacillin)-resistant Staphylococcus aureus (MRSA). MRSA is predictably resistant to beta-lactam antibiotics (except ceftaroline). Preferred therapy is vancomycin unless clinically contraindicated. Patient requires contact precautions if  hospitalized. CRITICAL RESULT CALLED TO, READ BACK BY AND VERIFIED WITH: JASON ROBBINS AT 4854 ON 10/13/2017 JJB    Methicillin resistance DETECTED (A) NOT DETECTED Final    Comment: CRITICAL RESULT CALLED TO, READ BACK BY AND  VERIFIED WITH: JASON ROBBINS AT 2952 ON 10/13/2017 JJB    Streptococcus species NOT DETECTED NOT DETECTED Final   Streptococcus agalactiae NOT DETECTED NOT DETECTED Final   Streptococcus pneumoniae NOT DETECTED NOT DETECTED Final   Streptococcus pyogenes NOT DETECTED NOT DETECTED Final   Acinetobacter baumannii NOT DETECTED NOT DETECTED Final   Enterobacteriaceae species NOT DETECTED NOT DETECTED Final   Enterobacter cloacae complex NOT DETECTED NOT DETECTED Final   Escherichia coli NOT DETECTED NOT DETECTED Final   Klebsiella oxytoca NOT DETECTED NOT DETECTED Final   Klebsiella pneumoniae NOT DETECTED NOT DETECTED Final   Proteus species NOT DETECTED NOT DETECTED Final   Serratia marcescens NOT DETECTED NOT DETECTED Final   Haemophilus influenzae NOT DETECTED NOT DETECTED Final   Neisseria meningitidis NOT DETECTED NOT DETECTED Final   Pseudomonas aeruginosa NOT DETECTED NOT DETECTED Final   Candida albicans NOT DETECTED NOT DETECTED Final   Candida glabrata NOT DETECTED NOT DETECTED Final   Candida krusei NOT DETECTED NOT DETECTED Final   Candida parapsilosis NOT DETECTED NOT DETECTED Final   Candida tropicalis NOT DETECTED NOT DETECTED Final    Comment: Performed at [facility], [address] [place], [place] [address]  MRSA PCR Screening     Status:  Abnormal   Collection Time: 10/12/17 11:04 PM  Result Value Ref Range Status   MRSA by PCR POSITIVE (A) NEGATIVE Final    Comment:        The GeneXpert MRSA Assay (FDA approved for NASAL specimens only), is one component of a comprehensive MRSA colonization surveillance program. It is not intended to diagnose MRSA infection nor to guide or monitor treatment for MRSA infections. RESULT CALLED TO, READ BACK BY AND VERIFIED WITH: TONI WALKER AT 0052 10/13/17.PMH Performed at [facility], [address] [place], [place] [address]   Culture, blood (single) w Reflex to ID Panel     Status: None (Preliminary result)   Collection Time: 10/13/17  4:01 PM  Result Value Ref Range Status   Specimen Description BLOOD Blood Culture adequate volume  Final   Special Requests BLOOD RIGHT HAND  Final   Culture   Final    NO GROWTH 3 DAYS Performed at [facility], [address], [place], [place] [address]    Report Status PENDING  Incomplete    Coagulation Studies: No results for input(s): LABPROT, INR in the last 72 hours.  Urinalysis: Recent Labs    10/14/17 1243  COLORURINE YELLOW*  LABSPEC 1.011  PHURINE 7.0  GLUCOSEU NEGATIVE  HGBUR MODERATE*  BILIRUBINUR NEGATIVE  KETONESUR NEGATIVE  PROTEINUR 100*  NITRITE NEGATIVE  LEUKOCYTESUR LARGE*      Imaging: No results found.   Medications:   . sodium chloride    . DAPTOmycin (CUBICIN)  IV     . acyclovir  400 mg Oral Daily  . budesonide (PULMICORT) nebulizer solution  0.25 mg Nebulization Q6H  . calcium-vitamin D  1 tablet Oral BID  . docusate sodium  100 mg Oral BID  . fentaNYL  12.5 mcg Transdermal Q72H  . furosemide  80 mg Intravenous Daily  . heparin  5,000 Units Subcutaneous Q8H  . insulin aspart  0-20 Units Subcutaneous TID WC  . insulin aspart  0-5 Units Subcutaneous QHS  . insulin detemir  3 Units Subcutaneous BID  . ipratropium-albuterol  3 mL Nebulization Q6H  . mupirocin ointment    Topical Daily  . oseltamivir  30 mg Oral Daily  . rifampin  300 mg Oral Q12H  . tamsulosin  0.4 mg Oral Daily  . traZODone  50 mg Oral QHS  . vitamin B-12  1,000 mcg Oral Daily  . vitamin C  500 mg Oral Daily   sodium chloride, acetaminophen, benzonatate, hydrALAZINE, loperamide, ondansetron (ZOFRAN) IV, oxyCODONE  Assessment/ Plan:  72 y.o. male with multiple myeloma, right nephrectomy secondary to renal cell carcinoma, diastolic congestive heart failure, anemia/thalessemia trait, AICD placement, diabetes mellitus type II, coronary artery disease status post CABG, who was admitted to [facility] with Acure resp failure Hospital course is complicated requirement for BiPAP, MRSA bacteremia, influenza A positive, acute renal failure, pneumonia  1.  Acute renal failure  2.  Chronic kidney disease stage III, baseline creatinine 1.64, GFR 46 on September 29, 2017 Renal ultrasound from Jan 12 shows rt nephrectomy and left kidney unremarkable 3.  MRSA sepsis 4.  Influenza A positive  ARF is likely secondary to sepsis and concurrent illness leading to ATN. Patient has multiple co morbidities and has bed ridden status. He has decubitus ulcers. Now has influenza A, MRSA bacteremia and there is suspicion of AICD vs endocarditis. If we consider dialysis, he would require more catheters which would be additional source of infection. A tunneled catheter placement would be very unlikely in face of ongoing infection, if he needed long term dialysis. For these reasons and his poor health, he is not a good candidate for HD. Consider palliative care evaluation and a family meeting if desired.       LOS: [place] 2/21/20199:21 [place], [place]  Note: This note was prepared with Dragon dictation. Any transcription errors are unintentional

## 2017-10-17 ENCOUNTER — Inpatient Hospital Stay: Payer: Medicare Other

## 2017-10-17 DIAGNOSIS — Z515 Encounter for palliative care: Secondary | ICD-10-CM

## 2017-10-17 LAB — GLUCOSE, CAPILLARY: Glucose-Capillary: 94 mg/dL (ref 65–99)

## 2017-10-17 MED ORDER — HALOPERIDOL LACTATE 5 MG/ML IJ SOLN: 0.5000 mg | INTRAMUSCULAR | Status: DC | PRN

## 2017-10-17 MED ORDER — HALOPERIDOL LACTATE 2 MG/ML PO CONC
0.5000 mg | ORAL | Status: DC | PRN
  Filled 2017-10-17: qty 0.3

## 2017-10-17 MED ORDER — ACETAMINOPHEN 325 MG PO TABS: 650.0000 mg | ORAL_TABLET | Freq: Four times a day (QID) | ORAL | Status: DC | PRN

## 2017-10-17 MED ORDER — LORAZEPAM 2 MG/ML PO CONC
1.0000 mg | ORAL | Status: DC | PRN
  Filled 2017-10-17: qty 0.5

## 2017-10-17 MED ORDER — GLYCOPYRROLATE 1 MG PO TABS
1.0000 mg | ORAL_TABLET | ORAL | Status: DC | PRN
  Filled 2017-10-17: qty 1

## 2017-10-17 MED ORDER — GLYCOPYRROLATE 0.2 MG/ML IJ SOLN
0.2000 mg | INTRAMUSCULAR | Status: DC | PRN
  Filled 2017-10-17: qty 1

## 2017-10-17 MED ORDER — ACETAMINOPHEN 650 MG RE SUPP: 650.0000 mg | Freq: Four times a day (QID) | RECTAL | Status: DC | PRN

## 2017-10-17 MED ORDER — POLYVINYL ALCOHOL 1.4 % OP SOLN
1.0000 [drp] | Freq: Four times a day (QID) | OPHTHALMIC | Status: DC | PRN
  Filled 2017-10-17: qty 15

## 2017-10-17 MED ORDER — BIOTENE DRY MOUTH MT LIQD: 15.0000 mL | OROMUCOSAL | Status: DC | PRN

## 2017-10-17 MED ORDER — LORAZEPAM 2 MG/ML IJ SOLN: 1.0000 mg | INTRAMUSCULAR | Status: DC | PRN

## 2017-10-17 MED ORDER — LORAZEPAM 1 MG PO TABS: 1.0000 mg | ORAL_TABLET | ORAL | Status: DC | PRN

## 2017-10-17 MED ORDER — ONDANSETRON HCL 4 MG/2ML IJ SOLN: 4.0000 mg | Freq: Four times a day (QID) | INTRAMUSCULAR | Status: DC | PRN

## 2017-10-17 MED ORDER — FENTANYL 25 MCG/HR TD PT72
25.0000 ug | MEDICATED_PATCH | TRANSDERMAL | Status: DC
  Administered 2017-10-17: 25 ug via TRANSDERMAL
  Filled 2017-10-17: qty 1

## 2017-10-17 MED ORDER — ONDANSETRON 4 MG PO TBDP
4.0000 mg | ORAL_TABLET | Freq: Four times a day (QID) | ORAL | Status: DC | PRN
  Filled 2017-10-17: qty 1

## 2017-10-17 MED ORDER — HALOPERIDOL 0.5 MG PO TABS
0.5000 mg | ORAL_TABLET | ORAL | Status: DC | PRN
  Filled 2017-10-17: qty 1

## 2017-10-17 NOTE — Progress Notes (Signed)
Patient ID: Johntavious Francom, male   DOB: 08-Feb-1946, 72 y.o.   MRN: [id number]  [employer] PROGRESS NOTE  Treyson Axel VHQ:[id number] DOB: 1945-10-27 DOA: 10/12/2017 PCP: [name], MD  HPI/Subjective: Confused,  wife and son had a family meeting with palliative care and agreeable with comfort care measures and CODE STATUS changed to DO NOT RESUSCITATE with comfort care today  Objective: Vitals:   10/17/17 0808 10/17/17 0811  BP: (!) 159/61   Pulse: 65   Resp: 19   Temp: 97.7 F (36.5 C)   SpO2: 98% 98%    Filed Weights   10/14/17 2143 10/15/17 0500 10/16/17 0455  Weight: 91 kg (200 lb 9.6 oz) 91.4 kg (201 lb 9.6 oz) 89.9 kg (198 lb 3.1 oz)    ROS: confused so unable to get ROS Exam: Physical Exam  Constitutional: He is oriented to person, place, and time. He is uncooperative.  confused  HENT:  Nose: No mucosal edema.  Mouth/Throat: No oropharyngeal exudate or posterior oropharyngeal edema.  Eyes: Conjunctivae, EOM and lids are normal. Pupils are equal, round, and reactive to light.  Neck: No JVD present. Carotid bruit is not present. No edema present. No thyroid mass and no thyromegaly present.  Cardiovascular: S1 normal and S2 normal. Exam reveals no gallop.  No murmur heard. Pulses:      Dorsalis pedis pulses are 2+ on the right side, and 2+ on the left side.  Respiratory: No respiratory distress. He has decreased breath sounds in the right lower field and the left lower field. He has no wheezes. He has no rhonchi. He has no rales.  GI: Soft. Bowel sounds are normal. There is no tenderness.  Musculoskeletal:       Right ankle: He exhibits swelling.       Left ankle: He exhibits swelling.  Lymphadenopathy:    He has no cervical adenopathy.  Neurological: He is oriented to person, place, and time. No cranial nerve deficit.  Skin: Skin is warm. Nails show no clubbing.  Bilateral heel decubiti with discoloration of the skin bilaterally.  Stage II  decubitus ulcer on buttock  Psychiatric: He has a normal mood and affect.      Data Reviewed: Basic Metabolic Panel: Recent Labs  Lab 10/12/17 1640 10/13/17 0459 10/14/17 0533 10/15/17 0710 10/16/17 0553  NA 141 141 136 135 137  K 4.1 4.0 4.4 5.1 5.0  CL 113* 113* 108 107 110  CO2 20* 17* 17* 17* 17*  GLUCOSE 196* 262* 256* 209* 97  BUN 53* 57* 74* 85* 86*  CREATININE 2.54* 2.52* 3.13* 3.55* 3.60*  CALCIUM 7.0* 6.9* 6.6* 6.7* 6.6*  MG  --  1.6*  --   --  2.0   Liver Function Tests: Recent Labs  Lab 10/12/17 1640 10/15/17 0710  AST 65* 38  ALT 41 32  ALKPHOS 61 63  BILITOT 0.8 0.8  PROT 5.6* 5.2*  ALBUMIN 1.9* 1.8*   CBC: Recent Labs  Lab 10/12/17 1640 10/13/17 0459 10/14/17 0533 10/15/17 0710 10/16/17 0553  WBC 1.7* 2.4* 2.9* 3.2* 4.9  NEUTROABS 1.3*  --   --   --   --   HGB 8.0* 7.8* 7.9* 8.0* 7.5*  HCT 24.9* 23.7* 24.1* 24.1* 23.1*  MCV 70.4* 69.7* 70.1* 69.6* 69.5*  PLT 71* 71* 87* 107* 110*   Cardiac Enzymes: Recent Labs  Lab 10/12/17 1640 10/15/17 1821  CKTOTAL  --  263  TROPONINI 1.22*  --    BNP (last  3 results) Recent Labs    09/04/17 1325 09/10/17 2113 10/12/17 1640  BNP 245.0* 137.0* 534.0*     CBG: Recent Labs  Lab 10/16/17 0733 10/16/17 1129 10/16/17 1616 10/16/17 2111 10/17/17 0806  GLUCAP 93 108* 138* 100* 94    Recent Results (from the past 240 hour(s))  Culture, blood (routine x 2)     Status: Abnormal   Collection Time: 10/12/17  5:05 PM  Result Value Ref Range Status   Specimen Description   Final    BLOOD RIGHT ANTECUBITAL Performed at [facility], [address] [place], [place] [address]    Special Requests   Final    BOTTLES DRAWN AEROBIC AND ANAEROBIC Blood Culture results may not be optimal due to an excessive volume of blood received in culture bottles Performed at [facility], [address], [place], [place] [address]    Culture  Setup Time   Final    GRAM POSITIVE COCCI IN  BOTH AEROBIC AND ANAEROBIC BOTTLES CRITICAL RESULT CALLED TO, READ BACK BY AND VERIFIED WITH: JASON ROBBINS AT 7741 ON 10/13/2017 [place] Performed at [facility], [place] [address] [place], [place] [address]    Culture METHICILLIN RESISTANT STAPHYLOCOCCUS AUREUS (A)  Final   Report Status 10/15/2017 FINAL  Final   Organism ID, Bacteria METHICILLIN RESISTANT STAPHYLOCOCCUS AUREUS  Final      Susceptibility   Methicillin resistant staphylococcus aureus - MIC*    CIPROFLOXACIN >=8 RESISTANT Resistant     ERYTHROMYCIN >=8 RESISTANT Resistant     GENTAMICIN <=0.5 SENSITIVE Sensitive     OXACILLIN >=4 RESISTANT Resistant     TETRACYCLINE <=1 SENSITIVE Sensitive     VANCOMYCIN 1 SENSITIVE Sensitive     TRIMETH/SULFA <=10 SENSITIVE Sensitive     CLINDAMYCIN >=8 RESISTANT Resistant     RIFAMPIN <=0.5 SENSITIVE Sensitive     Inducible Clindamycin NEGATIVE Sensitive     * METHICILLIN RESISTANT STAPHYLOCOCCUS AUREUS  Culture, blood (routine x 2)     Status: Abnormal   Collection Time: 10/12/17  5:05 PM  Result Value Ref Range Status   Specimen Description   Final    BLOOD RIGHT HAND Performed at [facility], [address] [place], [place] [address]    Special Requests   Final    BOTTLES DRAWN AEROBIC AND ANAEROBIC Blood Culture adequate volume Performed at [facility], [address] [place], [place] [address]    Culture  Setup Time   Final    GRAM POSITIVE COCCI IN BOTH AEROBIC AND ANAEROBIC BOTTLES CRITICAL RESULT CALLED TO, READ BACK BY AND VERIFIED WITH: JASON ROBBINS ATR 1514 ON 10/13/2017 JJB Performed at [facility], [address] [place], [place] [address]    Culture (A)  Final    STAPHYLOCOCCUS AUREUS SUSCEPTIBILITIES PERFORMED ON PREVIOUS CULTURE WITHIN THE LAST 5 DAYS. Performed at [facility], [place] [address], [place], [place] [address]    Report Status 10/15/2017 FINAL  Final  Blood Culture ID Panel (Reflexed)     Status:  Abnormal   Collection Time: 10/12/17  5:05 PM  Result Value Ref Range Status   Enterococcus species NOT DETECTED NOT DETECTED Final   Listeria monocytogenes NOT DETECTED NOT DETECTED Final   Staphylococcus species DETECTED (A) NOT DETECTED Final    Comment: CRITICAL RESULT CALLED TO, READ BACK BY AND VERIFIED WITH: JASON ROBBINS AT 1514 ON 10/13/2017 JJB    Staphylococcus aureus DETECTED (A) NOT DETECTED Final  Comment: Methicillin (oxacillin)-resistant Staphylococcus aureus (MRSA). MRSA is predictably resistant to beta-lactam antibiotics (except ceftaroline). Preferred therapy is vancomycin unless clinically contraindicated. Patient requires contact precautions if  hospitalized. CRITICAL RESULT CALLED TO, READ BACK BY AND VERIFIED WITH: JASON ROBBINS AT 8527 ON 10/13/2017 JJB    Methicillin resistance DETECTED (A) NOT DETECTED Final    Comment: CRITICAL RESULT CALLED TO, READ BACK BY AND VERIFIED WITH: JASON ROBBINS AT 7824 ON 10/13/2017 JJB    Streptococcus species NOT DETECTED NOT DETECTED Final   Streptococcus agalactiae NOT DETECTED NOT DETECTED Final   Streptococcus pneumoniae NOT DETECTED NOT DETECTED Final   Streptococcus pyogenes NOT DETECTED NOT DETECTED Final   Acinetobacter baumannii NOT DETECTED NOT DETECTED Final   Enterobacteriaceae species NOT DETECTED NOT DETECTED Final   Enterobacter cloacae complex NOT DETECTED NOT DETECTED Final   Escherichia coli NOT DETECTED NOT DETECTED Final   Klebsiella oxytoca NOT DETECTED NOT DETECTED Final   Klebsiella pneumoniae NOT DETECTED NOT DETECTED Final   Proteus species NOT DETECTED NOT DETECTED Final   Serratia marcescens NOT DETECTED NOT DETECTED Final   Haemophilus influenzae NOT DETECTED NOT DETECTED Final   Neisseria meningitidis NOT DETECTED NOT DETECTED Final   Pseudomonas aeruginosa NOT DETECTED NOT DETECTED Final   Candida albicans NOT DETECTED NOT DETECTED Final   Candida glabrata NOT DETECTED NOT DETECTED Final    Candida krusei NOT DETECTED NOT DETECTED Final   Candida parapsilosis NOT DETECTED NOT DETECTED Final   Candida tropicalis NOT DETECTED NOT DETECTED Final    Comment: Performed at [facility], [address] [place], [place] [address]  MRSA PCR Screening     Status: Abnormal   Collection Time: 10/12/17 11:04 PM  Result Value Ref Range Status   MRSA by PCR POSITIVE (A) NEGATIVE Final    Comment:        The GeneXpert MRSA Assay (FDA approved for NASAL specimens only), is one component of a comprehensive MRSA colonization surveillance program. It is not intended to diagnose MRSA infection nor to guide or monitor treatment for MRSA infections. RESULT CALLED TO, READ BACK BY AND VERIFIED WITH: TONI WALKER AT 0052 10/13/17.PMH Performed at [facility], [address] [place], [place] [address]   Culture, blood (single) w Reflex to ID Panel     Status: Abnormal (Preliminary result)   Collection Time: 10/13/17  4:01 PM  Result Value Ref Range Status   Specimen Description   Final    BLOOD Blood Culture adequate volume Performed at [facility], [address] [place], [place] [address]    Special Requests   Final    BLOOD RIGHT HAND Performed at [facility], [address] [place], [place] [address]    Culture  Setup Time   Final    GRAM POSITIVE COCCI ANAEROBIC BOTTLE ONLY CRITICAL RESULT CALLED TO, READ BACK BY AND VERIFIED WITH: Chinita [place] 10/16/17 [place] Performed at [facility], [address] [place], [place] [address]    Culture (A)  Final    STAPHYLOCOCCUS AUREUS SUSCEPTIBILITIES PERFORMED ON PREVIOUS CULTURE WITHIN THE LAST 5 DAYS. Performed at [facility], [place] [address], [place],  [address]    Report Status PENDING  Incomplete     Studies: No results found.  Scheduled Meds: . fentaNYL  25 mcg Transdermal Q72H  . insulin detemir  3 Units Subcutaneous BID  . traZODone  50 mg Oral QHS    Continuous Infusions:   Assessment/Plan: Deidre Ala  Linward Woodsis a 72 y.o.malewith myeloma, on chemo admitted with increasing soband found to have LLL infiltrate, influenza and MRSA bacteremia.Has sacral and heel ulcerswith are potential sources of MRSA bacteremia infection.Course complicated by cytopenias and ARF. He will need evaluation for endocarditis as well as evaluation by EP for removal of the AICD which is likely seeded.   Clinical sepsis, healthcare acquired pneumonia.  Positive blood cultures with staph aureus with methicillin resistance.  - continue daptomycin and rifampin per ID - plan on leaving ICD in place and suppressing with doxycycline after a 6 week course of IV abx 1. Acute hypoxic respiratory failure.  Patient off BiPAP and now on nasal cannula. 2. Influenza A positive - given Tamiflu which is discontinued at this point 3. Acute kidney injury on chronic kidney disease stage III. 4. Chronic multiple myeloma with pancytopenia.  Last chemotherapy February 4th.  5. Chronic heel and sacral decubiti.  Local wound care as needed 6. Hypomagnesemia replaced 7. Recent lumbar spine compression fracture with interventional radiology kyphoplasty on last hospitalization. 8. Anemia of chronic disease 9. Atrial fibrillation with rapid ventricular response- on Coreg 10. BPH on Flomax 11. Type 2 diabetes mellitus on sliding scale and low-dose Levemir 12. Urinary retention Foley is in place  Very poor prognosis with multiple comorbidities high mortality rate, Palliative care had a family meeting with patient's wife and son and CODE STATUS was changed to DO NOT RESUSCITATE with comfort care measures.  Plan is to transfer the patient to hospice home when bed is available  Code Status:     Code Status Orders  (From admission, onward)        Start     Ordered   10/12/17 2157   full code continuous     10/12/17 2156    Code Status History    Date Active Date Inactive  Code Status Order ID Comments User Context   09/11/2017 03:08 09/16/2017 20:28 Full Code [id number]  Amelia Jo, MD Inpatient   09/04/2017 17:55 09/08/2017 18:54 Full Code [id number]  [name], MD ED      CODE STATUS changed to DO NOT RESUSCITATE with comfort care measures today  Family Communication: wife at the bedside Disposition Plan: Hospice home when bed is available  Consultants:  Infectious disease  Time spent: 59 minutes  Illene Silver Huan Pollok  [employer]

## 2017-10-17 NOTE — Progress Notes (Signed)
Pressure injury on sacrum cleansed and dressing changed. Patient given PRN pain medication for pain. Patient's pacemaker/ICD has been turned off by manufacturer as he is transitioned into full comfort care. Will continue to monitor patient and give him and family the support they need.

## 2017-10-17 NOTE — Progress Notes (Addendum)
New hospice home referral received form CSW Pernell Dupre following a Palliative Medicine consult. Patient is a 72 year old man with ESRD on dialysis admitted to [facility] from [place] on 2/17 for evaluation of respiratory distress found to have pneumonia and positive influenza required BIPAP on arrival. He has a known history of multiple myeloma, undergoing chemo, CHF,AICD placement DM II and HTN. He was found to MRSA bacteremia and has been treated with IV antibiotics. He has continued to decline and has been followed since 2/19 by Palliative medicine. Palliative NP Mariana Kaufman met again today with patient and his family. Patient has continued to decline and is now confused and lethargic. Family has chosen to focus on comfort with transfer to the hospice home. AICD deactivated this afternoon. Writer met in the family room with patient's wife Lurline Hare and daughter in law Arsenio Katz to initiated education regarding hospice services, philosophy and team approach to care with good understanding voiced.Questions answered, consents signed. Patient information faxed to referral. Plan is for patient to transfer to the hospice home tomorrow 2/23. Family and hospital care team aware.Will require EMS transport and signed DNR in place. Hospital care team updated and aware. Flo Shanks RN,BSN, [facility], hospital liaison [phone]

## 2017-10-17 NOTE — Care Management (Signed)
Update Randi with [employer] on plan for comfort care and transfer to residential hospice when bed is available

## 2017-10-17 NOTE — Clinical Social Work Note (Addendum)
CSW received referral that patient's family is requesting hospice facility.  CSW spoke to patient's family and they have chosen, Insurance underwriter and [facility].  CSW contacted [place] [facility] nurse liaison, and  they do not have a bed available today, but she will let CSW know once a bed is available.  CSW to continue to follow patient's progress throughout discharge planning.  CSW spoke with [facility] and they should be able to accept patient on Saturday.  CSW to continue to follow patient's progress throughout discharge planning.  Jones Broom. [place], MSW, [place]  10/17/2017 1:03 PM

## 2017-10-17 NOTE — Progress Notes (Addendum)
Daily Progress Note   Patient Name: Adam Wall       Date: 10/17/2017 DOB: 1946-02-05  Age: 72 y.o. MRN#: [id number] Attending Physician: Nicholes Mango, MD Primary Care Physician: [name], MD Admit Date: 10/12/2017  Reason for Consultation/Follow-up: Establishing goals of care  Subjective: Patient continues to decline with increasing creatinine- not candidate for HD, MRSA +bacteremia- likely seeded in AICD- not good candidate for exctraction.  Per discussion with family he had previous MRSA infection at [facility]. He has been declining since September.   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   Discussed patient's poor prognosis and multiple comorbidities. Reviewed importance of patient's independence and functionality to him and the fact that medical care can no longer return this to him. He has not walked in 3 months.  Discussed the difference between continued aggressive medical care and comfort care paths. Advance directives including code status, IV fluids, IV medications, and other life prolonging interventions were discussed.  Family decided on focusing on patient's comfort and quality of life.  Decision was made to transition to full comfort care and request residential hospice placement. They agree to do not resuscitate status.  This was also discussed gently and simply with patient as he has intermittent confusion. He states that he does not want to suffer and wants to leave  the hospital as soon as possible.   Review of Systems  Unable to perform ROS: Mental status change    Length of Stay: 5  Current Medications: Scheduled Meds:  . fentaNYL  12.5 mcg Transdermal Q72H  . insulin detemir  3 Units Subcutaneous BID  . traZODone  50 mg Oral QHS    Continuous Infusions:   PRN Meds: acetaminophen, benzonatate, hydrALAZINE, ondansetron (ZOFRAN) IV, oxyCODONE  Physical Exam  Constitutional: He appears well-developed.  HENT:  Head: Normocephalic and atraumatic.  Cardiovascular:  tachycardic  Pulmonary/Chest:  Increased rr  Neurological:  lethargic  Skin: He is diaphoretic.  Nursing note and vitals reviewed.  Vital Signs: BP (!) 159/61 (BP Location: Left Arm)   Pulse 65   Temp 97.7 F (36.5 C) (Oral)   Resp 19   Ht 6' (1.829 m)   Wt 89.9 kg (198 lb 3.1 oz)   SpO2 98%   BMI 26.88 kg/m  SpO2: SpO2: 98 % O2 Device: O2 Device: Nasal Cannula O2 Flow Rate: O2 Flow Rate (L/min): 3 L/min  Intake/output summary:   Intake/Output Summary (Last 24 hours) at 10/17/2017 1009 Last data filed at 10/16/2017 1344 Gross per 24 hour  Intake 800 ml  Output -  Net 800 ml   LBM: Last BM Date: (unable to assess) Baseline Weight: Weight: 90.7 kg (200 lb) Most recent weight: Weight: 89.9 kg (198 lb 3.1 oz)       Palliative Assessment/Data: PPS: 20%    Flowsheet Rows     Most Recent Value  Intake Tab  Referral Department  Hospitalist  Unit at Time of Referral  Med/Surg Unit  Palliative Care Primary Diagnosis  Pulmonary  Date Notified  10/13/17  Palliative Care Type  Return patient Palliative Care  Reason for referral  Clarify Goals of Care  Date of Admission  10/12/17  Date first seen by Palliative Care  10/14/17  # of days Palliative referral response time  1 Day(s)  # of days IP prior to Palliative referral  1  Clinical Assessment  Psychosocial & Spiritual Assessment  Palliative Care Outcomes      Patient Active Problem List    Diagnosis Date Noted  . Bacteremia   . Respiratory failure ([place])   . Advanced care planning/counseling discussion   . Palliative care by specialist   . Acute respiratory failure with hypoxemia ([place]) 10/12/2017  . Pressure injury of skin 09/12/2017  . Back pain, acute 09/11/2017  . Dysphagia 09/04/2017  . Goals of care, counseling/discussion 08/18/2017  . Multiple myeloma not having achieved remission ([place]) 08/18/2017  . Multiple myeloma ([place]) 08/18/2017  . Adenopathy   . SOBOE (shortness of breath on exertion) 03/13/2017  . Renal mass 03/03/2017  . 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 Iron deficiency anemia 02/22/2017  . Atherosclerosis of native artery of both lower extremities with intermittent claudication ([place]) 02/21/2017  . Atherosclerosis of native arteries of extremity with intermittent claudication ([place]) 07/08/2016  . Hyperlipidemia 07/08/2016  . Coronary atherosclerosis 07/08/2016  . Diabetes ([place]) 07/08/2016  . Bilateral leg edema 05/09/2015  . Biventricular ICD (implantable cardioverter-defibrillator) in place 05/09/2015  . Biventricular implantable cardioverter-defibrillator in situ 04/12/2015  . Acute combined systolic and diastolic congestive heart failure ([place]) 04/11/2015  . Essential hypertension 03/30/2015  . Left carotid bruit 03/30/2015  . Microcytic anemia 03/30/2015  . Weak pulse 03/30/2015  . Mitral valve insufficiency 03/22/2015  . Moderate mitral insufficiency 03/22/2015  . Chronic systolic CHF (congestive heart failure), NYHA class 2 ([place]) 10/25/2014  . Chronic systolic heart failure ([place]) 10/25/2014  . Acute kidney injury (nontraumatic) ([place]) 10/18/2014  . Complicated UTI (urinary tract infection) 10/18/2014  . Pyelonephritis 10/18/2014  . Congestive cardiomyopathy ([place]) 05/11/2014  . Colon polyp 01/01/2013  . Benign prostatic hyperplasia without lower urinary tract symptoms 06/12/2012  . Elevated prostate specific antigen (PSA) 06/12/2012  .  Chronic anemia 03/18/2012  . Referral of patient 03/18/2012  . Sepsis due to urinary tract infection ([place]) 03/18/2012  . Sepsis secondary to UTI ([place]) 03/18/2012  . Elevated PSA 03/17/2012  . Hypertension 03/17/2012  . LBBB (left bundle branch block) 03/17/2012  . Left bundle branch block (LBBB)  03/17/2012  . Microalbuminuria 03/17/2012  . Retinopathy 03/17/2012  . Atherosclerosis 05/28/2011    Palliative Care Assessment & Plan   Patient Profile:71 y.o. male  with past medical history of multiple myeloma (previously being tx with revlimid, dex, velcade, on hold d/t acute illness), CHF, CAD, s/p AICD, CKD s/p nephrectomy, DM, admitted on 10/12/2017 with respiratory distress requiring bipap. Workup has revealed bacteremia +MRSA, AKI on CKD, CXR pulm congestion LLL pneumonia. He was being treated at [facility] for norovirus and PNA.   He is scheduled for a TEE to evaluate for vegetation around AICD leads or possible endocarditis. He is high risk for AICD extraction. This is his third admission in the last three months. Most recently he was admitted in January for compression fractures and was discharged to [facility] for rehab. Palliative medicine was consulted for "SNF resident with multiple medical problems. Goals of care. Poor candidate for intubation, ACLS or HD"   Assessment/Recommendations/Plan   DNR  Transition to full comfort care  Increase fentanyl patch to 2mg/hr  Lorazepam 139mevery 4 hours prn for anxiety  Robinul 68m29mvery 4 hrs excessive secretions   Prognosis:   < 2 weeks d/t MRSA bacteremia, renal failure in setting of multiple myeloma, CHF, transition to comfort measures only  Discharge Planning:  Hospice facility pending evaluation and bed availability  Care plan was discussed with Dr. GouMargaretmary Eddyhank you for allowing the Palliative Medicine Team to assist in the care of this patient.   Time In: 0830 Time Out: 1020 Total Time 110 minutes  Prolonged Time Billed Yes      Greater than 50%  of this time was spent counseling and coordinating care related to the above assessment and plan.  KasMariana KaufmanGNP-C Palliative Medicine   Please contact Palliative Medicine Team phone at [phone] questions and concerns.

## 2017-10-17 NOTE — Care Management Important Message (Signed)
Important Message  Patient Details  Name: Adam Wall MRN: [id number] Date of Birth: [birth year]   Medicare Important Message Given:  Yes Signed IM notice given to wife   Katrina Stack, RN 10/17/2017, 7:48 AM

## 2017-10-18 LAB — CULTURE, BLOOD (SINGLE): SPECIMEN DESCRIPTION: ADEQUATE

## 2017-10-18 MED ORDER — LORAZEPAM 2 MG/ML PO CONC: 1.0000 mg | ORAL | 0 refills | Status: AC | PRN

## 2017-10-18 MED ORDER — BIOTENE DRY MOUTH MT LIQD: 15.0000 mL | OROMUCOSAL | Status: AC | PRN

## 2017-10-18 MED ORDER — GLYCOPYRROLATE 0.2 MG/ML IJ SOLN: 0.2000 mg | INTRAMUSCULAR | Status: AC | PRN

## 2017-10-18 MED ORDER — ONDANSETRON 4 MG PO TBDP: 4.0000 mg | ORAL_TABLET | Freq: Four times a day (QID) | ORAL | 0 refills | Status: AC | PRN

## 2017-10-18 MED ORDER — POLYVINYL ALCOHOL 1.4 % OP SOLN: 1.0000 [drp] | Freq: Four times a day (QID) | OPHTHALMIC | 0 refills | Status: AC | PRN

## 2017-10-18 MED ORDER — LORAZEPAM 2 MG/ML IJ SOLN: 1.0000 mg | INTRAMUSCULAR | 0 refills | Status: AC | PRN

## 2017-10-18 MED ORDER — LORAZEPAM 1 MG PO TABS: 1.0000 mg | ORAL_TABLET | ORAL | 0 refills | Status: AC | PRN

## 2017-10-18 MED ORDER — TRAZODONE HCL 50 MG PO TABS: 50.0000 mg | ORAL_TABLET | Freq: Every day | ORAL | Status: AC

## 2017-10-18 MED ORDER — GLYCOPYRROLATE 1 MG PO TABS: 1.0000 mg | ORAL_TABLET | ORAL | Status: AC | PRN

## 2017-10-18 MED ORDER — BENZONATATE 100 MG PO CAPS: 100.0000 mg | ORAL_CAPSULE | Freq: Two times a day (BID) | ORAL | 0 refills | Status: AC | PRN

## 2017-10-18 MED ORDER — HALOPERIDOL LACTATE 2 MG/ML PO CONC: 0.5000 mg | ORAL | 0 refills | Status: AC | PRN

## 2017-10-18 MED ORDER — FENTANYL 25 MCG/HR TD PT72: 25.0000 ug | MEDICATED_PATCH | TRANSDERMAL | 0 refills | Status: AC

## 2017-10-18 MED ORDER — HALOPERIDOL 0.5 MG PO TABS: 0.5000 mg | ORAL_TABLET | ORAL | Status: AC | PRN

## 2017-10-18 MED ORDER — DOCUSATE SODIUM 100 MG PO CAPS: 100.0000 mg | ORAL_CAPSULE | Freq: Two times a day (BID) | ORAL | 0 refills | Status: AC | PRN

## 2017-10-18 NOTE — Progress Notes (Signed)
Called and gave report to Hospice facility nurse at this time. All questions answered. Request to leave all 3 PIV's in at this time. Will leave in place. Wenda Low [facility]

## 2017-10-18 NOTE — Progress Notes (Signed)
Changed buttocks wound dressing at this time, as ordered daily. Wenda Low [facility]

## 2017-10-18 NOTE — Plan of Care (Signed)
  Not Progressing Clinical Measurements: Diagnostic test results will improve 10/18/2017 5701 - Not Progressing by Jeri Cos, RN Respiratory complications will improve 10/18/2017 7793 - Not Progressing by Jeri Cos, RN

## 2017-10-18 NOTE — Discharge Instructions (Signed)
Acute Respiratory Distress Syndrome, Adult °Acute respiratory distress syndrome is a life-threatening condition in which fluid collects in the lungs. This prevents the lungs from filling with air and passing oxygen into the blood. This can cause the lungs and other vital organs to fail. The condition usually develops following an infection, illness, surgery, or injury. °What are the causes? °This condition may be caused by: °· An infection, such as sepsis or pneumonia. °· A serious injury to the head or chest. °· Severe bleeding from an injury. °· A major surgery. °· Breathing in harmful chemicals or smoke. °· Blood transfusions. °· A blood clot in the lungs. °· Breathing in vomit (aspiration). °· Near-drowning. °· Inflammation of the pancreas (pancreatitis). °· A drug overdose. °What are the signs or symptoms? °Sudden shortness of breath and rapid breathing are the main symptoms of this condition. Other symptoms may include: °· A fast or irregular heartbeat. °· Skin, lips, or fingernails that look blue (cyanosis). °· Confusion. °· Tiredness or loss of energy. °· Chest pain, particularly while taking a breath. °· Coughing. °· Restlessness or anxiety. °· Fever. This is usually present if there is an underlying infection, such as pneumonia. °How is this diagnosed? °This condition is diagnosed based on: °· Your symptoms. °· Medical history. °· A physical exam. During the exam, your health care provider will listen to your heart and check for crackling or wheezing sounds in your lungs. °You may also have other tests to confirm the diagnosis and measure how well your lungs are working. These may include: °· Measuring the amount of oxygen in your blood. Your health care provider will use two methods to do this procedure: °¨ A small device (pulse oximeter) that is placed on your finger, earlobe, or toe. °¨ An arterial blood gas test. A sample of blood is taken from an artery and tested for oxygen levels. °· Blood  tests. °· Chest X-rays or CT scans to look for fluid in the lungs. °· Taking a sample of your sputum to test for infection. °· Heart test, such as an echocardiogram or electrocardiogram. This is done to rule out any heart problems (such as heart failure) that may be causing your symptoms. °· Bronchoscopy. During this test, a thin, flexible tube with a light is passed into the mouth or nose, down the windpipe, and into the lungs. °How is this treated? °Treatment depends on the cause of your condition. The goal is to support you while your lungs heal and the underlying cause is treated. Treatment may include: °· Oxygen therapy. This may be done through: °¨ A tube in your nose or a face mask. °¨ A ventilator. This device helps move air into and out of your lungs through a breathing tube that is inserted into your mouth or nose. °· Continuous positive airway pressure (CPAP). This treatment uses mild air pressure to keep the airways open. A mask or other device will be placed over your nose or mouth. °· Tracheostomy. During this procedure, a small cut is made in your neck to create an opening to your windpipe. A breathing tube is placed directly into your windpipe. The breathing tube is connected to a ventilator. This is done if you have problems with your airway or if you need a ventilator for a long period of time. °· Positioning you to lie on your stomach (prone position). °· Medicines, such as: °¨ Sedatives to help you relax. °¨ Blood pressure medicines. °¨ Antibiotics to treat infection. °¨ Blood thinners   to prevent blood clots. °¨ Diuretics to help prevent excess fluid. °· Fluids and nutrients given through an IV tube. °· Wearing compression stockings on your legs to prevent blood clots. °· Extra corporeal membrane oxygenation (ECMO). This treatment takes blood outside your body, adds oxygen, and removes carbon dioxide. The blood is then returned to your body. This treatment is only used in severe cases. °Follow  these instructions at home: °· Take over-the-counter and prescription medicines only as told by your health care provider. °· Do not use any products that contain nicotine or tobacco, such as cigarettes and e-cigarettes. If you need help quitting, ask your health care provider. °· Limit alcohol intake to no more than 1 drink per day for nonpregnant women and 2 drinks per day for men. One drink equals 12 oz of beer, 5 oz of wine, or 1½ oz of hard liquor. °· Ask friends and family to help you if daily activities make you tired. °· Attend any pulmonary rehabilitation as told by your health care provider. This may include: °¨ Education about your condition. °¨ Exercises. °¨ Breathing training. °¨ Counseling. °¨ Learning techniques to conserve energy. °¨ Nutrition counseling. °· Keep all follow-up visits as told by your health care provider. This is important. °Contact a health care provider if: °· You become short of breath during activity or while resting. °· You develop a cough that does not go away. °· You have a fever. °· Your symptoms do not get better or they get worse. °· You become anxious or depressed. °Get help right away if: °· You have sudden shortness of breath. °· You develop sudden chest pain that does not go away. °· You develop a rapid heart rate. °· You develop swelling or pain in one of your legs. °· You cough up blood. °· You have trouble breathing. °· Your skin, lips, or fingernails turn blue. °These symptoms may represent a serious problem that is an emergency. Do not wait to see if the symptoms will go away. Get medical help right away. Call your local emergency services (911 in the [place] Do not drive yourself to the hospital. °Summary °· Acute respiratory distress syndrome is a life-threatening condition in which fluid collects in the lungs, which leads the lungs and other vital organs to fail. °· This condition usually develops following an infection, illness, surgery, or injury. °· Sudden  shortness of breath and rapid breathing are the main symptoms of acute respiratory distress syndrome. °· Treatment may include oxygen therapy, continuous positive airway pressure (CPAP), tracheostomy, lying on your stomach (prone position), medicines, fluids and nutrients given through an IV tube, compression stockings, and extra corporeal membrane oxygenation (ECMO). °This information is not intended to replace advice given to you by your health care provider. Make sure you discuss any questions you have with your health care provider. °Document Released: 08/12/2005 Document Revised: 07/29/2016 Document Reviewed: 07/29/2016 °Elsevier Interactive Patient Education © 2017 [employer] ° °

## 2017-10-18 NOTE — Progress Notes (Signed)
Per Dr. Margaretmary Eddy, patient is to discharge to hospice WITH Foley catheter. Will leave in place. Wenda Low [facility]

## 2017-10-18 NOTE — Discharge Summary (Signed)
[facility] NAME: Adam Wall    MR#:  [id number]  DATE OF BIRTH:  Jun 24, 1946  DATE OF ADMISSION:  10/12/2017 ADMITTING PHYSICIAN: Gorden Harms, MD  DATE OF DISCHARGE:  10/18/17  PRIMARY CARE PHYSICIAN: [name], MD    ADMISSION DIAGNOSIS:  Respiratory distress [R06.03] Healthcare-associated pneumonia [J18.9] Acute congestive heart failure, unspecified heart failure type ([place]) [I50.9]  DISCHARGE DIAGNOSIS:  Active Problems:   Acute respiratory failure with hypoxemia (HCC)   Respiratory failure ([employer])   Advanced care planning/counseling discussion   Palliative care by specialist   Bacteremia   Terminal care   SECONDARY DIAGNOSIS:   Past Medical History:  Diagnosis Date  . AICD (automatic cardioverter/defibrillator) present   . Anemia   . Blockage of coronary artery of heart ([place])   . Cancer ([place])   . CHF (congestive heart failure) ([place])   . Chronic kidney disease 05/22/2017   nephrectomy-right  . Diabetes mellitus without complication ([place])   . Dysrhythmia   . Heart murmur   . Hypertension   . Hypochloremia   . LBBB (left bundle branch block)   . Neuropathy   . Presence of permanent cardiac pacemaker   . Right renal mass   . 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 male with a known history per below, recent hospital discharge, in skilled nursing facility-currently being treated for pneumonia/normal virus/influenza, presents to the emergency room with worsening dyspnea/shortness of breath, satting 88% on nonrebreather, placed on subsequent BiPAP, found to have acute left lower lobe pneumonia, congestive heart failure chest x-ray, noted pancytopenia-currently undergoing chemotherapy for multiple myeloma-last treatment on September 29, 2017, patient evaluated at the bedside with wife present, patient still with increased work of breathing on  BiPAP, patient is now been admitted for acute hypoxic respiratory failure secondary to healthcare associated pneumonia, acute on chronic systolic congestive heart failure exacerbation in patient with immunocompromise state from cancer   1. failure to thrive with clinical sepsis, healthcare acquired pneumonia.  Positive blood cultures with staph aureus with methicillin resistance.  1. Acute hypoxic respiratory failure.  Patient off BiPAP and now on nasal cannula. 2. Influenza A positive - given Tamiflu which is discontinued at this point 3. Acute kidney injury on chronic kidney disease stage III. 4. Chronic multiple myeloma with pancytopenia.  Last chemotherapy February 4th.  5. Chronic heel and sacral decubiti.  Local wound care as needed 6. Hypomagnesemia replaced 7. Recent lumbar spine compression fracture with interventional radiology kyphoplasty on last hospitalization. 8. Anemia of chronic disease 9. Atrial fibrillation with rapid ventricular response- on Coreg 10. BPH on Flomax 11. Type 2 diabetes mellitus on sliding scale and low-dose Levemir 12. Urinary retention Foley is in place 14.  Sacral cubitus ulcer   Patient's wife and son has decided to give the patient DNR comfort care measures.  Patient will be transferred to hospice home today  DISCHARGE CONDITIONS:   guarded  CONSULTS OBTAINED:  Treatment Team:  Cammie Sickle, MD Leonel Ramsay, MD Teodoro [place], MD Murlean Iba, MD   PROCEDURES BiPAP  DRUG ALLERGIES:  No Known Allergies  DISCHARGE MEDICATIONS:   Allergies as of 10/18/2017   No Known Allergies     Medication List    STOP taking these medications   acyclovir 400 MG tablet Commonly known as:  ZOVIRAX   alendronate 70 MG tablet  Commonly known as:  FOSAMAX   atorvastatin 40 MG tablet Commonly known as:  LIPITOR   calcium-vitamin D 500-200 MG-UNIT tablet Commonly known as:  OSCAL WITH D   carvedilol 25 MG tablet Commonly known  as:  COREG   diclofenac sodium 1 % Gel Commonly known as:  VOLTAREN   fentaNYL 12 MCG/HR Commonly known as:  DURAGESIC - dosed mcg/hr Replaced by:  fentaNYL 25 MCG/HR patch   furosemide 40 MG tablet Commonly known as:  LASIX   gabapentin 100 MG capsule Commonly known as:  NEURONTIN   insulin NPH-regular Human (70-30) 100 UNIT/ML injection Commonly known as:  NOVOLIN 70/30   menthol-cetylpyridinium 3 MG lozenge Commonly known as:  CEPACOL   ondansetron 4 MG tablet Commonly known as:  ZOFRAN   potassium chloride SA 20 MEQ tablet Commonly known as:  K-DUR,KLOR-CON   vitamin B-12 1000 MCG tablet Commonly known as:  CYANOCOBALAMIN   vitamin C 500 MG tablet Commonly known as:  ASCORBIC ACID     TAKE these medications   acetaminophen 325 MG tablet Commonly known as:  TYLENOL Take 2 tablets (650 mg total) by mouth every 6 (six) hours as needed for mild pain (or Fever >/= 101).   albuterol (2.5 MG/3ML) 0.083% nebulizer solution Commonly known as:  PROVENTIL Take 2.5 mg by nebulization every 4 (four) hours as needed for wheezing or shortness of breath.   antiseptic oral rinse Liqd Apply 15 mLs topically as needed for dry mouth.   benzonatate 100 MG capsule Commonly known as:  TESSALON Take 1 capsule (100 mg total) by mouth 2 (two) times daily as needed for cough.   docusate sodium 100 MG capsule Commonly known as:  COLACE Take 1 capsule (100 mg total) by mouth 2 (two) times daily as needed for mild constipation. What changed:    when to take this  reasons to take this   fentaNYL 25 MCG/HR patch Commonly known as:  DURAGESIC - dosed mcg/hr Place 1 patch (25 mcg total) onto the skin every 3 (three) days. Start taking on:  10/20/2017 Replaces:  fentaNYL 12 MCG/HR   glycopyrrolate 1 MG tablet Commonly known as:  ROBINUL Take 1 tablet (1 mg total) by mouth every 4 (four) hours as needed (excessive secretions).   glycopyrrolate 0.2 MG/ML injection Commonly known  as:  ROBINUL Inject 1 mL (0.2 mg total) into the skin every 4 (four) hours as needed (excessive secretions).   glycopyrrolate 0.2 MG/ML injection Commonly known as:  ROBINUL Inject 1 mL (0.2 mg total) into the vein every 4 (four) hours as needed (excessive secretions).   haloperidol 0.5 MG tablet Commonly known as:  HALDOL Take 1 tablet (0.5 mg total) by mouth every 4 (four) hours as needed for agitation (or delirium).   haloperidol 2 MG/ML solution Commonly known as:  HALDOL Place 0.3 mLs (0.6 mg total) under the tongue every 4 (four) hours as needed for agitation (or delirium).   loperamide 2 MG tablet Commonly known as:  IMODIUM A-D Take 2 mg by mouth every 6 (six) hours as needed for diarrhea or loose stools.   LORazepam 1 MG tablet Commonly known as:  ATIVAN Take 1 tablet (1 mg total) by mouth every 4 (four) hours as needed for anxiety.   LORazepam 2 MG/ML concentrated solution Commonly known as:  ATIVAN Place 0.5 mLs (1 mg total) under the tongue every 4 (four) hours as needed for anxiety.   LORazepam 2 MG/ML injection Commonly known as:  ATIVAN  Inject 0.5 mLs (1 mg total) into the vein every 4 (four) hours as needed for anxiety.   ondansetron 4 MG disintegrating tablet Commonly known as:  ZOFRAN-ODT Take 1 tablet (4 mg total) by mouth every 6 (six) hours as needed for nausea.   oxyCODONE 5 MG immediate release tablet Commonly known as:  Oxy IR/ROXICODONE Take 1 tablet (5 mg total) by mouth every 4 (four) hours as needed for moderate pain or severe pain.   polyvinyl alcohol 1.4 % ophthalmic solution Commonly known as:  LIQUIFILM TEARS Place 1 drop into both eyes 4 (four) times daily as needed for dry eyes.   tamsulosin 0.4 MG Caps capsule Commonly known as:  FLOMAX Take 0.4 mg by mouth daily.   traZODone 50 MG tablet Commonly known as:  DESYREL Take 1 tablet (50 mg total) by mouth at bedtime.        DISCHARGE INSTRUCTIONS:   Transfer patient to hospice  home with Foley catheter and oxygen for comfort  DIET:  Regular diet  DISCHARGE CONDITION:  fair  ACTIVITY:  Activity as tolerated  OXYGEN:  Home Oxygen: Yes.     Oxygen Delivery: 2 liters/min via Patient connected to nasal cannula oxygen prn for comfort  DISCHARGE LOCATION:  Hospice home  If you experience worsening of your admission symptoms, develop shortness of breath, life threatening emergency, suicidal or homicidal thoughts you must seek medical attention immediately by calling 911 or calling your MD immediately  if symptoms less severe.  You Must read complete instructions/literature along with all the possible adverse reactions/side effects for all the Medicines you take and that have been prescribed to you. Take any new Medicines after you have completely understood and accpet all the possible adverse reactions/side effects.   Please note  You were cared for by a hospitalist during your hospital stay. If you have any questions about your discharge medications or the care you received while you were in the hospital after you are discharged, you can call the unit and asked to speak with the hospitalist on call if the hospitalist that took care of you is not available. Once you are discharged, your primary care physician will handle any further medical issues. Please note that NO REFILLS for any discharge medications will be authorized once you are discharged, as it is imperative that you return to your primary care physician (or establish a relationship with a primary care physician if you do not have one) for your aftercare needs so that they can reassess your need for medications and monitor your lab values.     Today  Chief Complaint  Patient presents with  . Respiratory Distress    Patient is somewhat alert but pleasantly confused still.  Asking for pain medicine ROS: Limited CONSTITUTIONAL: Denies fevers, chills.  RESPIRATORY: Denies cough, wheeze, shortness of  breath.  CARDIOVASCULAR: Denies chest pain, palpitations, edema.  GASTROINTESTINAL: Denies nausea, vomiting, diarrhea, abdominal pain. Denies bright red blood per rectum.    VITAL SIGNS:  Blood pressure (!) 105/35, pulse 71, temperature 98 F (36.7 C), temperature source Oral, resp. rate (!) 22, height 6' (1.829 m), weight 89.9 kg (198 lb 3.1 oz), SpO2 94 %.  I/O:    Intake/Output Summary (Last 24 hours) at 10/18/2017 1230 Last data filed at 10/18/2017 0604 Gross per 24 hour  Intake -  Output 1050 ml  Net -1050 ml    PHYSICAL EXAMINATION:  GENERAL:  72 y.o.-year-old patient lying in the bed with no acute distress.  EYES: Pupils equal, round, reactive to light and accommodation. No scleral icterus. Extraocular muscles intact.  HEENT: Head atraumatic, normocephalic. Oropharynx and nasopharynx clear.  NECK:  Supple, no jugular venous distention. No thyroid enlargement, no tenderness.  LUNGS: Normal breath sounds bilaterally, no wheezing, rales,rhonchi or crepitation. No use of accessory muscles of respiration.  CARDIOVASCULAR: S1, S2 normal. No murmurs, rubs, or gallops.  ABDOMEN: Soft, non-tender, non-distended. Bowel sounds present. No organomegaly or mass.  EXTREMITIES: No pedal edema, cyanosis, or clubbing.  NEUROLOGIC: Arousable, awake and alert but pleasantly confused PSYCHIATRIC: The patient is alert and oriented x1 with pleasant confusion SKIN: No obvious rash, lesion, or ulcer.   DATA REVIEW:   CBC Recent Labs  Lab 10/16/17 0553  WBC 4.9  HGB 7.5*  HCT 23.1*  PLT 110*    Chemistries  Recent Labs  Lab 10/15/17 0710 10/16/17 0553  NA 135 137  K 5.1 5.0  CL 107 110  CO2 17* 17*  GLUCOSE 209* 97  BUN 85* 86*  CREATININE 3.55* 3.60*  CALCIUM 6.7* 6.6*  MG  --  2.0  AST 38  --   ALT 32  --   ALKPHOS 63  --   BILITOT 0.8  --     Cardiac Enzymes Recent Labs  Lab 10/12/17 1640  TROPONINI 1.22*    Microbiology Results  Results for orders placed or  performed during the hospital encounter of 10/12/17  Culture, blood (routine x 2)     Status: Abnormal   Collection Time: 10/12/17  5:05 PM  Result Value Ref Range Status   Specimen Description   Final    BLOOD RIGHT ANTECUBITAL Performed at [facility], [address] [place], [place] [address]    Special Requests   Final    BOTTLES DRAWN AEROBIC AND ANAEROBIC Blood Culture results may not be optimal due to an excessive volume of blood received in culture bottles Performed at [facility], [address] [place], [place] [address]    Culture  Setup Time   Final    GRAM POSITIVE COCCI IN BOTH AEROBIC AND ANAEROBIC BOTTLES CRITICAL RESULT CALLED TO, READ BACK BY AND VERIFIED WITH: JASON ROBBINS AT 6283 ON 10/13/2017 [place] Performed at [facility], [place] [address], [place], [place] [address]    Culture METHICILLIN RESISTANT STAPHYLOCOCCUS AUREUS (A)  Final   Report Status 10/15/2017 FINAL  Final   Organism ID, Bacteria METHICILLIN RESISTANT STAPHYLOCOCCUS AUREUS  Final      Susceptibility   Methicillin resistant staphylococcus aureus - MIC*    CIPROFLOXACIN >=8 RESISTANT Resistant     ERYTHROMYCIN >=8 RESISTANT Resistant     GENTAMICIN <=0.5 SENSITIVE Sensitive     OXACILLIN >=4 RESISTANT Resistant     TETRACYCLINE <=1 SENSITIVE Sensitive     VANCOMYCIN 1 SENSITIVE Sensitive     TRIMETH/SULFA <=10 SENSITIVE Sensitive     CLINDAMYCIN >=8 RESISTANT Resistant     RIFAMPIN <=0.5 SENSITIVE Sensitive     Inducible Clindamycin NEGATIVE Sensitive     * METHICILLIN RESISTANT STAPHYLOCOCCUS AUREUS  Culture, blood (routine x 2)     Status: Abnormal   Collection Time: 10/12/17  5:05 PM  Result Value Ref Range Status   Specimen Description   Final    BLOOD RIGHT HAND Performed at [facility], [address] [place], [place] [address]    Special Requests   Final    BOTTLES DRAWN AEROBIC AND ANAEROBIC Blood Culture adequate volume Performed at [facility], [address], [place], [place] [address]    Culture  Setup Time   Final    GRAM POSITIVE COCCI IN BOTH AEROBIC AND ANAEROBIC BOTTLES CRITICAL RESULT CALLED TO, READ BACK BY AND VERIFIED WITH: JASON ROBBINS ATR 1443 ON 10/13/2017 JJB Performed at [facility], [address] [place], [place] [address]    Culture (A)  Final    STAPHYLOCOCCUS AUREUS SUSCEPTIBILITIES PERFORMED ON PREVIOUS CULTURE WITHIN THE LAST 5 DAYS. Performed at [facility], [place] [address], [place], [place] [address]    Report Status 10/15/2017 FINAL  Final  Blood Culture ID Panel (Reflexed)     Status: Abnormal   Collection Time: 10/12/17  5:05 PM  Result Value Ref Range Status   Enterococcus species NOT DETECTED NOT DETECTED Final   Listeria monocytogenes NOT DETECTED NOT DETECTED Final   Staphylococcus species DETECTED (A) NOT DETECTED Final    Comment: CRITICAL RESULT CALLED TO, READ BACK BY AND VERIFIED WITH: JASON ROBBINS AT 1514 ON 10/13/2017 JJB    Staphylococcus aureus DETECTED (A) NOT DETECTED Final    Comment: Methicillin (oxacillin)-resistant Staphylococcus aureus (MRSA). MRSA is predictably resistant to beta-lactam antibiotics (except ceftaroline). Preferred therapy is vancomycin unless clinically contraindicated. Patient requires contact precautions if  hospitalized. CRITICAL RESULT CALLED TO, READ BACK BY AND VERIFIED WITH: JASON ROBBINS AT 9509 ON 10/13/2017 JJB    Methicillin resistance DETECTED (A) NOT DETECTED Final    Comment: CRITICAL RESULT CALLED TO, READ BACK BY AND VERIFIED WITH: JASON ROBBINS AT 3267 ON 10/13/2017 JJB    Streptococcus species NOT DETECTED NOT DETECTED Final   Streptococcus agalactiae NOT DETECTED NOT DETECTED Final   Streptococcus pneumoniae NOT DETECTED NOT DETECTED Final   Streptococcus pyogenes NOT DETECTED NOT DETECTED Final   Acinetobacter baumannii NOT DETECTED NOT DETECTED Final   Enterobacteriaceae species NOT DETECTED NOT  DETECTED Final   Enterobacter cloacae complex NOT DETECTED NOT DETECTED Final   Escherichia coli NOT DETECTED NOT DETECTED Final   Klebsiella oxytoca NOT DETECTED NOT DETECTED Final   Klebsiella pneumoniae NOT DETECTED NOT DETECTED Final   Proteus species NOT DETECTED NOT DETECTED Final   Serratia marcescens NOT DETECTED NOT DETECTED Final   Haemophilus influenzae NOT DETECTED NOT DETECTED Final   Neisseria meningitidis NOT DETECTED NOT DETECTED Final   Pseudomonas aeruginosa NOT DETECTED NOT DETECTED Final   Candida albicans NOT DETECTED NOT DETECTED Final   Candida glabrata NOT DETECTED NOT DETECTED Final   Candida krusei NOT DETECTED NOT DETECTED Final   Candida parapsilosis NOT DETECTED NOT DETECTED Final   Candida tropicalis NOT DETECTED NOT DETECTED Final    Comment: Performed at [facility], [address] [place], [place] [address]  MRSA PCR Screening     Status: Abnormal   Collection Time: 10/12/17 11:04 PM  Result Value Ref Range Status   MRSA by PCR POSITIVE (A) NEGATIVE Final    Comment:        The GeneXpert MRSA Assay (FDA approved for NASAL specimens only), is one component of a comprehensive MRSA colonization surveillance program. It is not intended to diagnose MRSA infection nor to guide or monitor treatment for MRSA infections. RESULT CALLED TO, READ BACK BY AND VERIFIED WITH: TONI WALKER AT 0052 10/13/17.PMH Performed at [facility], [address] [place], [place] [address]   Culture, blood (single) w Reflex to ID Panel     Status: Abnormal   Collection Time: 10/13/17  4:01 PM  Result Value  Ref Range Status   Specimen Description   Final    BLOOD Blood Culture adequate volume Performed at [facility], [address] [place], [place] [address]    Special Requests   Final    BLOOD RIGHT HAND Performed at [facility], [address] [place], [place] [address]    Culture  Setup Time   Final    GRAM POSITIVE  COCCI ANAEROBIC BOTTLE ONLY CRITICAL RESULT CALLED TO, READ BACK BY AND VERIFIED WITH: Chinita Greenland 10/16/17 0240 KLW Performed at [facility], [address] [place], [place] [address]    Culture (A)  Final    STAPHYLOCOCCUS AUREUS SUSCEPTIBILITIES PERFORMED ON PREVIOUS CULTURE WITHIN THE LAST 5 DAYS. Performed at [facility], [place] [address], [place], [place] [address]    Report Status 10/18/2017 FINAL  Final    RADIOLOGY:  No results found.  EKG:   Orders placed or performed during the hospital encounter of 10/12/17  . ED EKG  . ED EKG      Management plans discussed with the patient, family and they are in agreement.  CODE STATUS:     Code Status Orders  (From admission, onward)        Start     Ordered   10/17/17 1023  Do not attempt resuscitation (DNR)  Continuous    Question Answer Comment  In the event of cardiac or respiratory ARREST Do not call a "code blue"   In the event of cardiac or respiratory ARREST Do not perform Intubation, CPR, defibrillation or ACLS   In the event of cardiac or respiratory ARREST Use medication by any route, position, wound care, and other measures to relive pain and suffering. May use oxygen, suction and manual treatment of airway obstruction as needed for comfort.      10/17/17 1023    Code Status History    Date Active Date Inactive Code Status Order ID Comments User Context   10/17/2017 10:06 10/17/2017 10:23 DNR [id number]  Earlie Counts, NP Inpatient   10/12/2017 21:57 10/17/2017 10:06 Full Code [id number]  Gorden Harms, MD Inpatient   09/11/2017 03:08 09/16/2017 20:28 Full Code [id number]  Amelia Jo, MD Inpatient   09/04/2017 17:55 09/08/2017 18:54 Full Code [id number]  [name], MD ED      TOTAL TIME TAKING CARE OF THIS PATIENT: 41  minutes.   Note: This dictation was prepared with Dragon dictation along with smaller phrase technology. Any transcriptional errors that result from this process  are unintentional.   @M[facility] @  on 10/18/2017 at 12:30 PM  Between 7am to 6pm - Pager - [phone]  After 6pm go to [email or web address] - password [email or web address] [facility]  Office  [phone]  CC: Primary care physician; [name], MD

## 2017-10-18 NOTE — Clinical Social Work Note (Addendum)
Patient to be d/c'ed today to [facility].  Patient and family agreeable to plans will transport via ems RN to call report [phone].  CSW updated patient's wife Sabana, [phone].    Evette Cristal, MSW, [place]

## 2017-10-18 NOTE — Progress Notes (Signed)
Per Dr. Margaretmary Eddy, patient doesn't need a printed script for his ordered Docusate Sodium. Wenda Low [facility]

## 2017-10-18 NOTE — Progress Notes (Signed)
Called for EMS non-emergent patient transport to the Hospice facility at this time. Patient is third in line to be picked up. Wenda Low [facility]

## 2017-10-21 ENCOUNTER — Inpatient Hospital Stay: Payer: Medicare Other | Admitting: Oncology

## 2017-10-21 ENCOUNTER — Inpatient Hospital Stay: Payer: Medicare Other

## 2017-10-24 ENCOUNTER — Inpatient Hospital Stay: Payer: Medicare Other

## 2017-10-24 ENCOUNTER — Ambulatory Visit: Payer: Medicare Other | Admitting: Oncology

## 2017-10-24 DEATH — deceased

## 2017-10-29 ENCOUNTER — Other Ambulatory Visit: Payer: Medicare Other

## 2017-11-03 ENCOUNTER — Ambulatory Visit: Payer: Medicare Other | Admitting: Radiation Oncology

## 2018-10-15 IMAGING — CR DG CHEST 2V
2 series · 2 of 2 positions shown · non-contrast
Comparison: Chest x-ray dated 09/02/2014.

CLINICAL DATA: Increased shortness of breath especially upon
exertion, known compression fractures, history of CHF, hypertension,
neuropathy, pacemaker, myeloma

EXAM:
CHEST  2 VIEW

[chest lat]
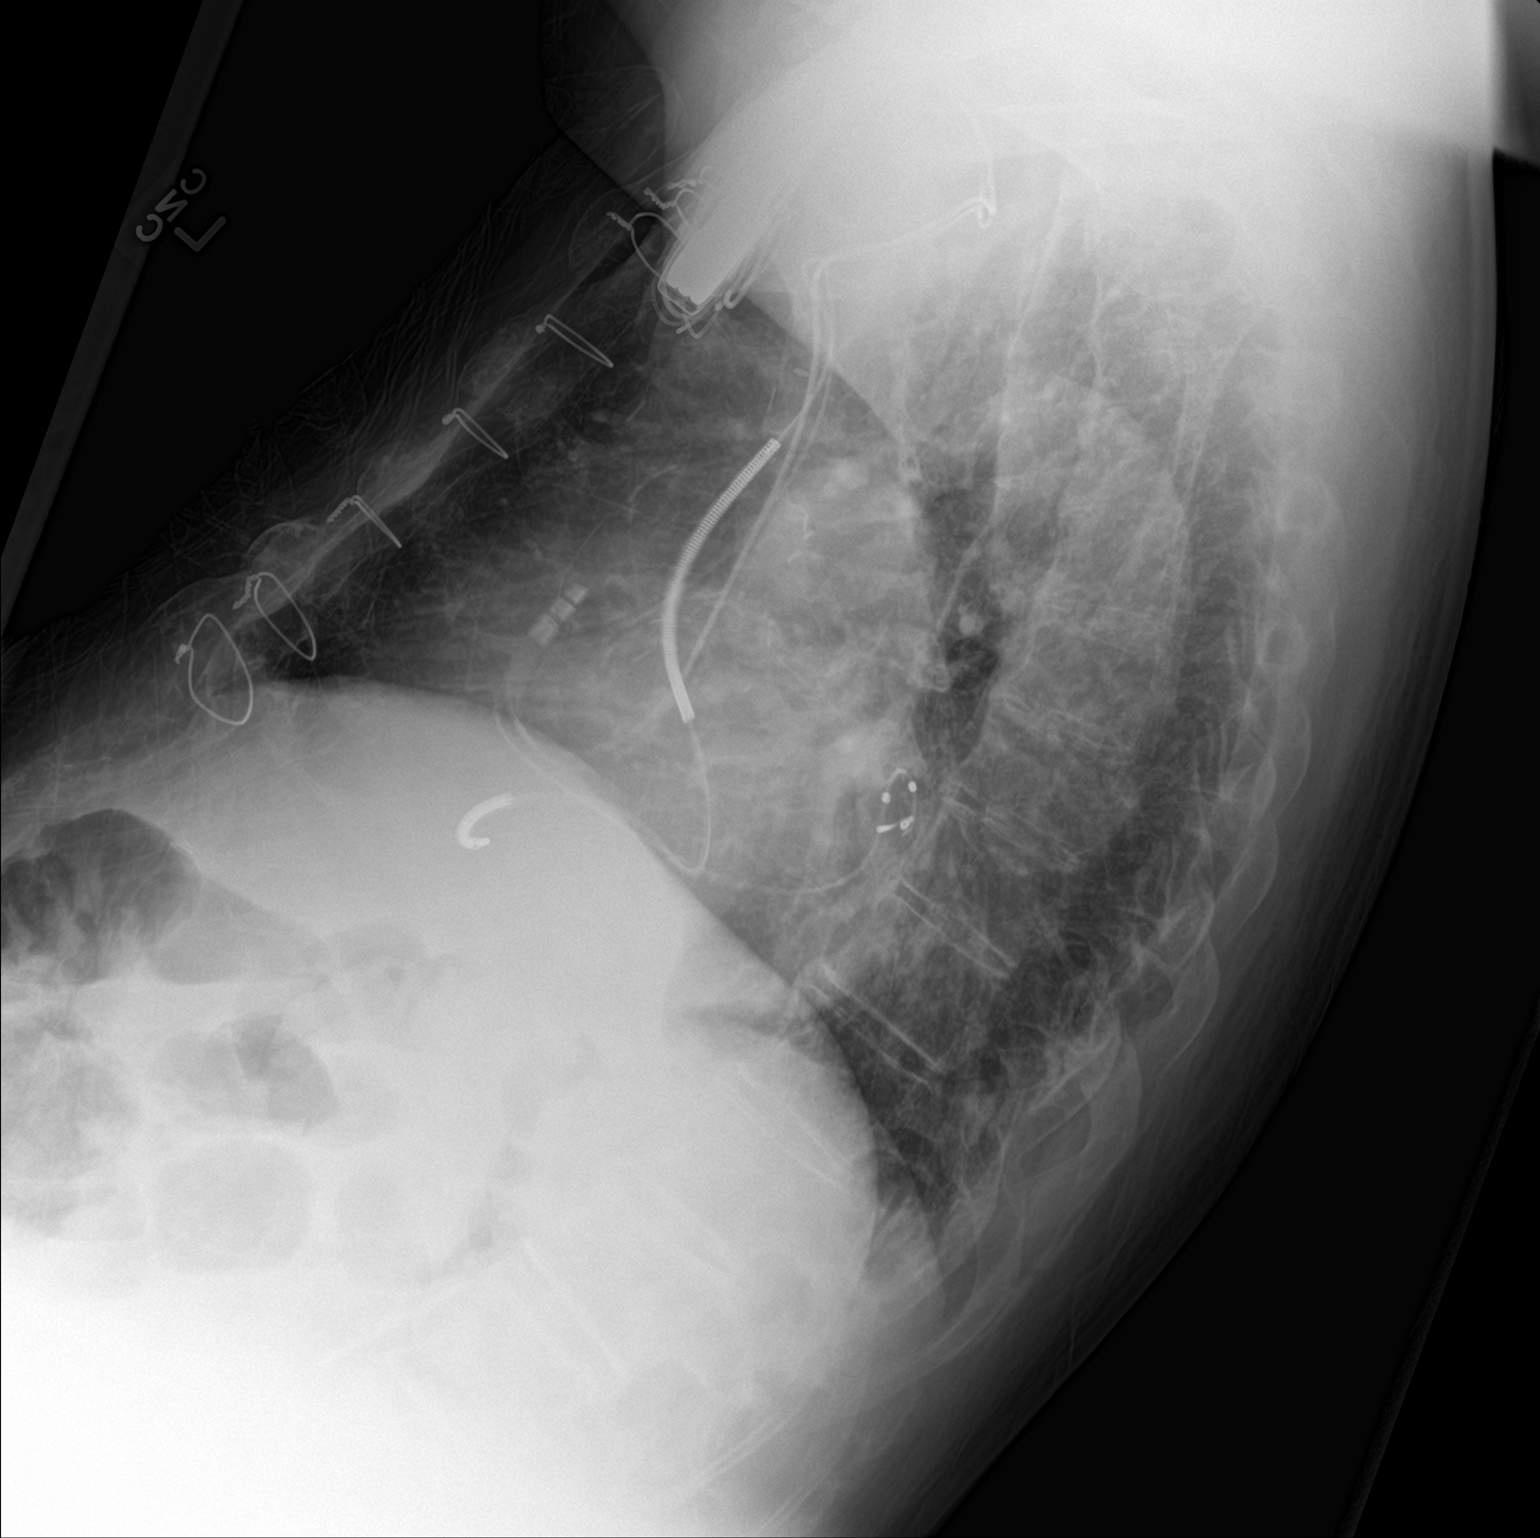

[chest ap]
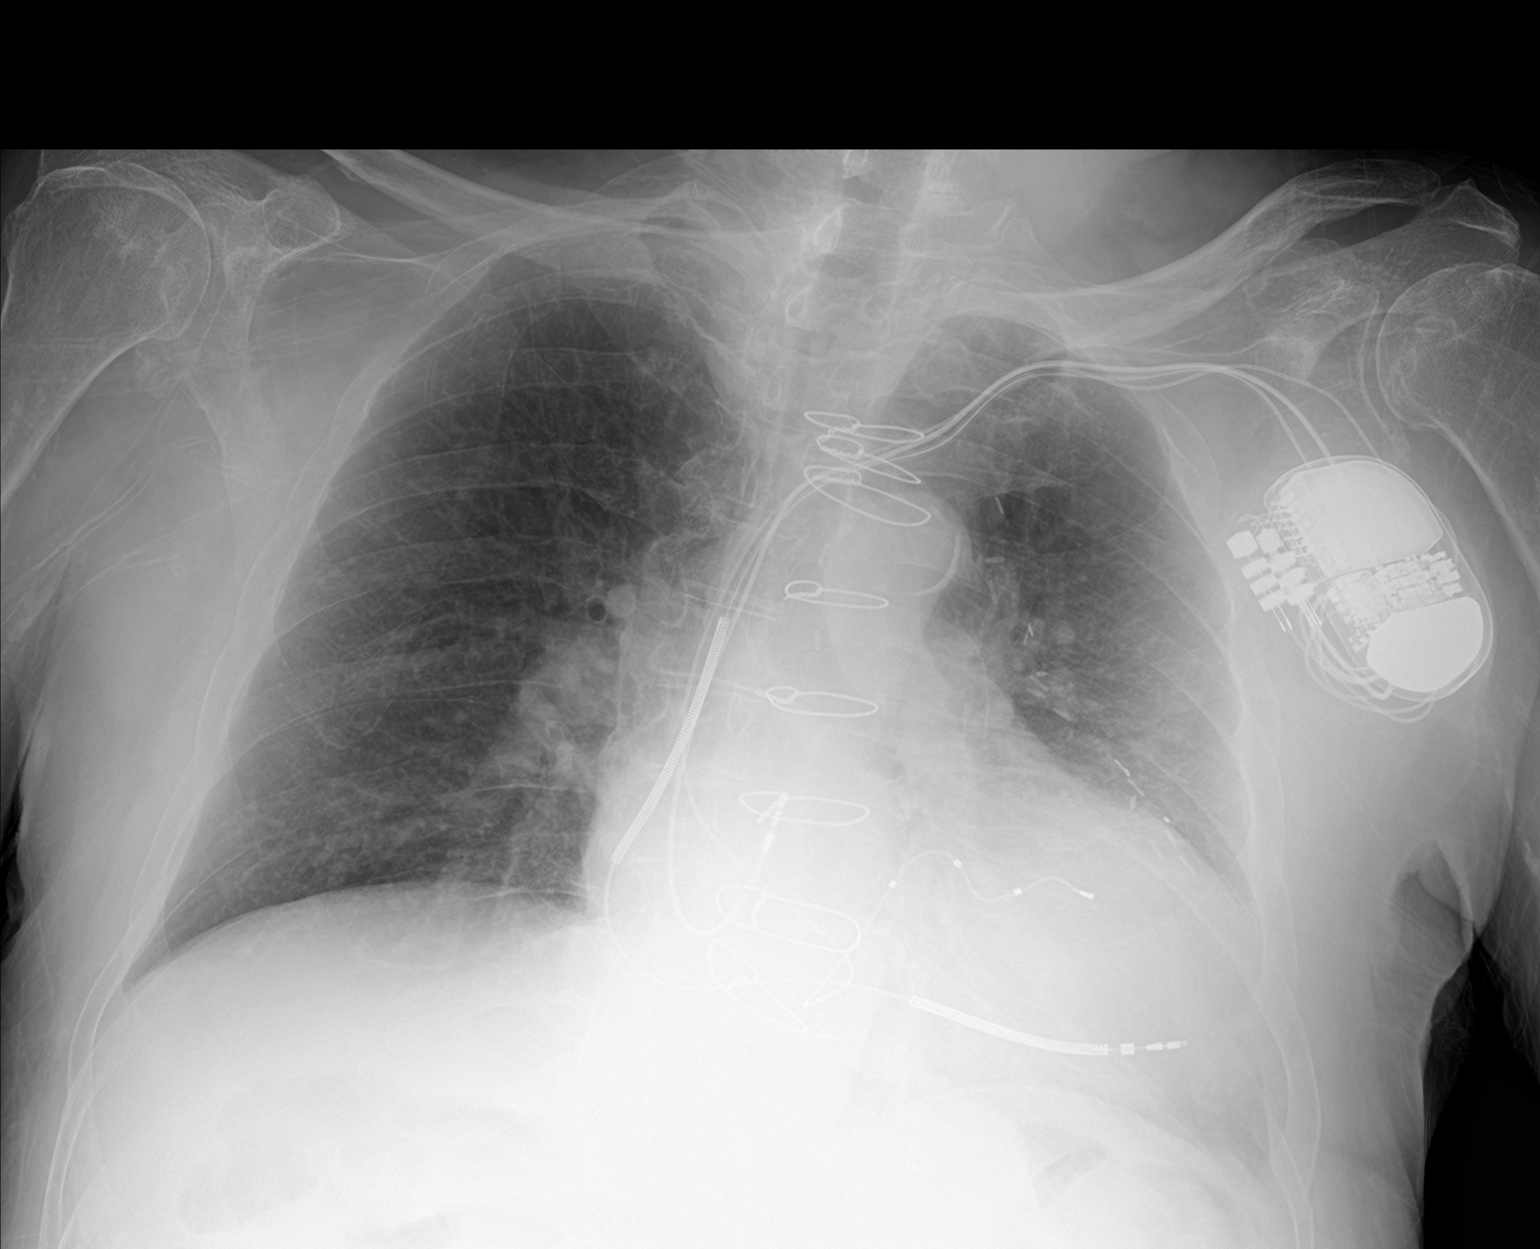

[2 of 2 positions shown; findings below may reference images not displayed]

FINDINGS: Stable cardiomegaly. Median sternotomy wires appear intact and
stable in alignment, related to presumed CABG. Aortic
atherosclerosis. Pacemaker/ICD apparatus in place.

Lungs appear clear. No pleural effusion or pneumothorax seen. Mild
degenerative spurring within the thoracic spine. No acute or
suspicious osseous finding.
IMPRESSION: No active cardiopulmonary disease. No evidence of pneumonia or
pulmonary edema.

Stable cardiomegaly.

Aortic atherosclerosis.

## 2018-10-23 IMAGING — XA IR KYPHO VERTEBRAL LUMBAR AUGMENTATION
13 of 20 series · 13 of 21 positions shown · non-contrast
Comparison: Lumbar spine CT - 08/28/2017;

CLINICAL DATA: History of multiple myeloma, now with symptomatic
malignant L5 compression fracture.

Patient with history of AICD/pacemaker and as such, presents today
for fluoroscopic guided L5 vertebral body biopsy and cement
augmentation.
EXAM:
FLUOROSCOPIC GUIDED KYPHOPLASTY OF THE L5 VERTEBRAL BODY
TECHNIQUE: The procedure, risks (including but not limited to bleeding,
infection, organ damage), benefits, and alternatives were explained
to the patient. Questions regarding the procedure were encouraged
and answered. The patient understands and consents to the procedure.

[Series 1: fl - angio · 1 of 1 slices shown (1 of 9)]
[im 1/1]
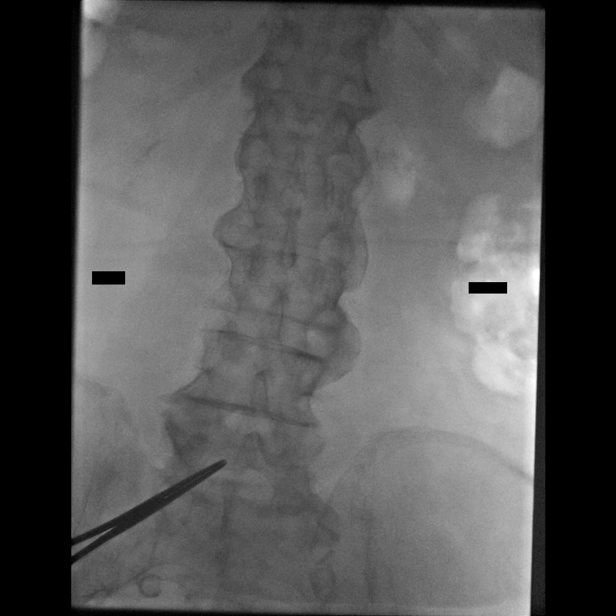

[Series 3: fl - angio · 1 of 1 slices shown (2 of 9)]
[im 1/1]
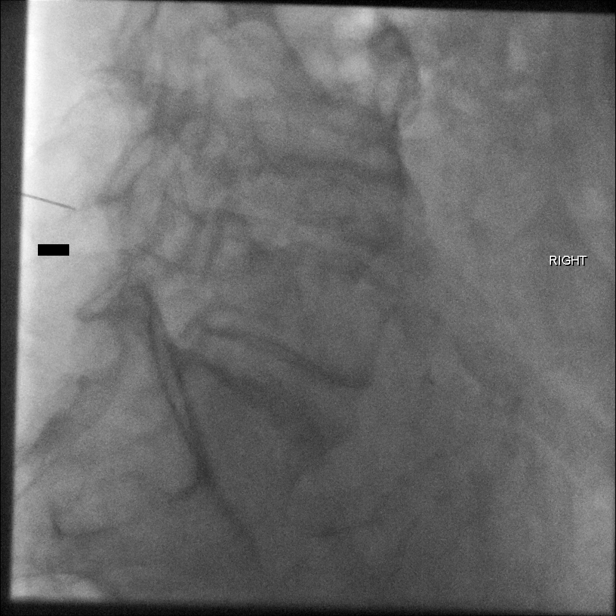

[Series 5: fl - angio · 1 of 1 slices shown (3 of 9)]
[im 1/1]
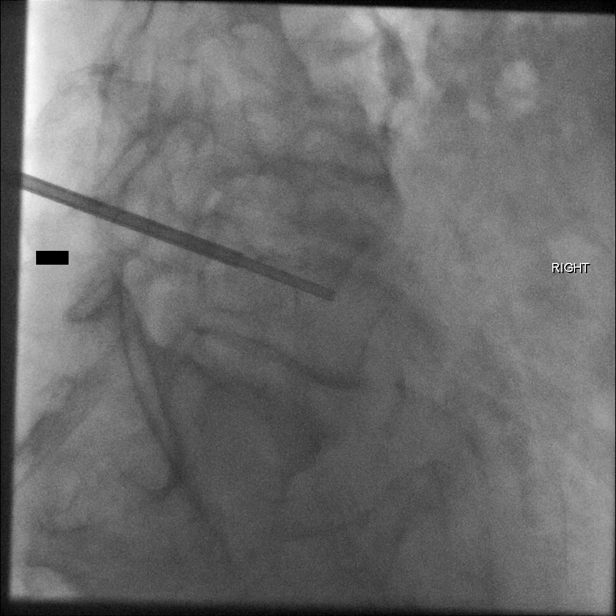

[Series 6: fl - angio · 1 of 1 slices shown (4 of 9)]
[im 1/1]
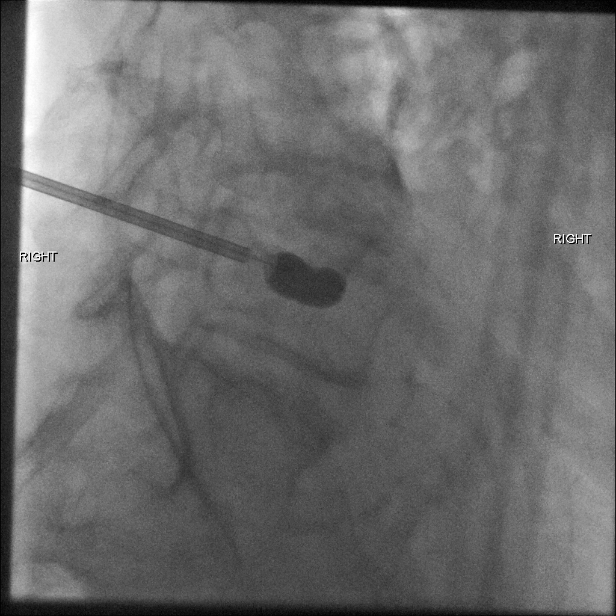

[Series 8: fl - angio · 1 of 1 slices shown (5 of 9)]
[im 1/1]
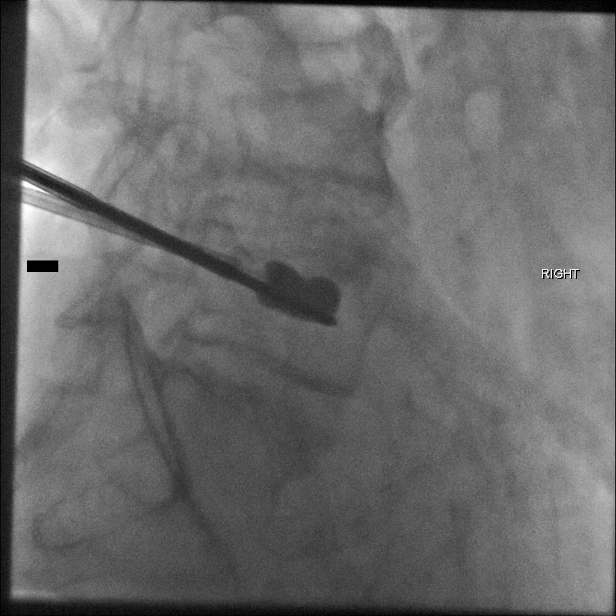

[Series 9: fl - angio · 1 of 1 slices shown (6 of 9)]
[im 1/1]
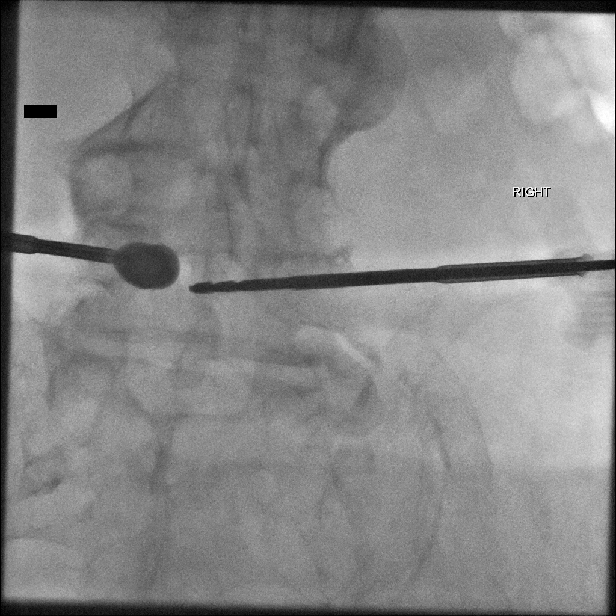

[Series 11: fl - angio · 1 of 1 slices shown (7 of 9)]
[im 1/1]
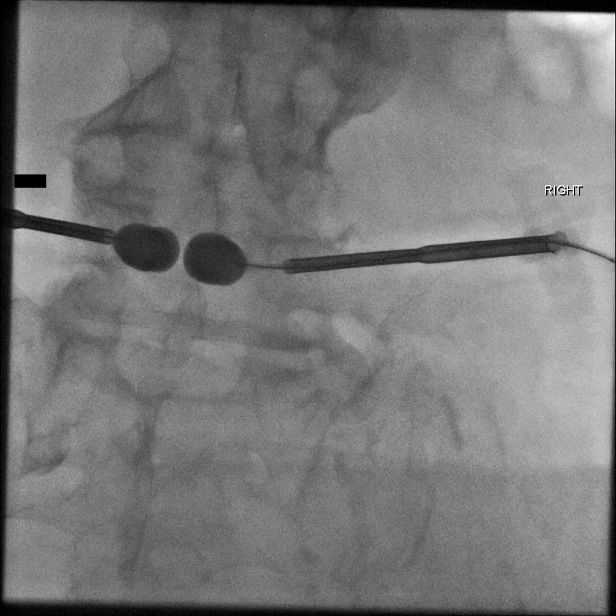

[Series 14: fl - angio · 1 of 1 slices shown (8 of 9)]
[im 1/1]
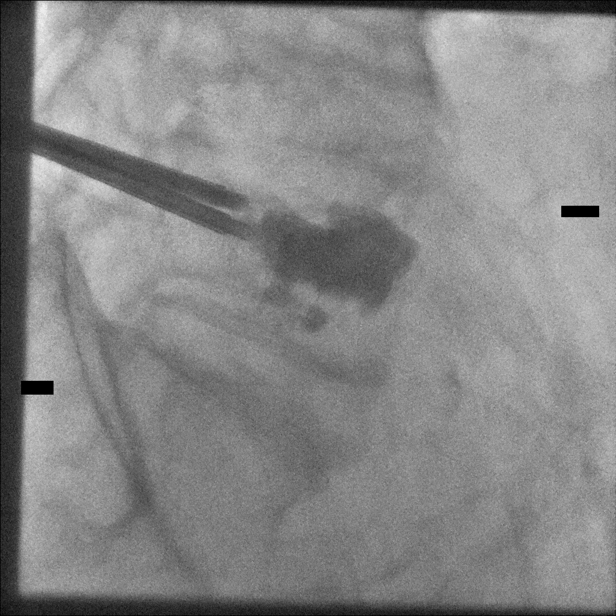

[Series 15: fl - angio · 1 of 1 slices shown (9 of 9)]
[im 1/1]
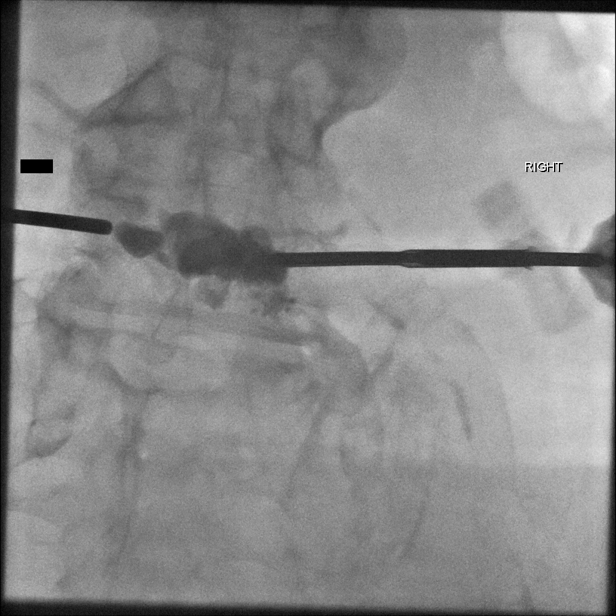

[Series 17: single · 1 of 1 slices shown (1 of 4)]
[im 1/1]
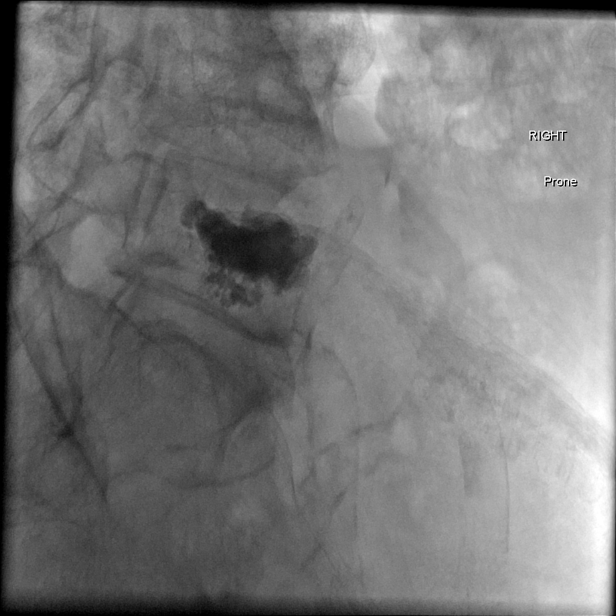

[Series 18: single · 1 of 1 slices shown (2 of 4)]
[im 1/1]
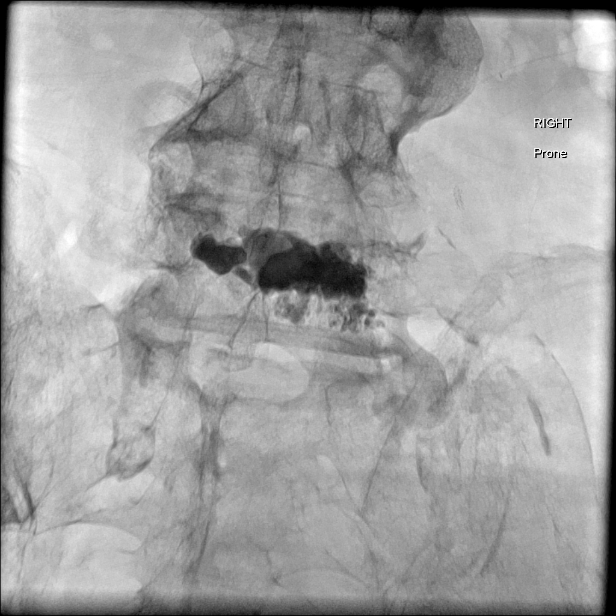

[Series 20: single · 1 of 1 slices shown (3 of 4)]
[im 1/1]
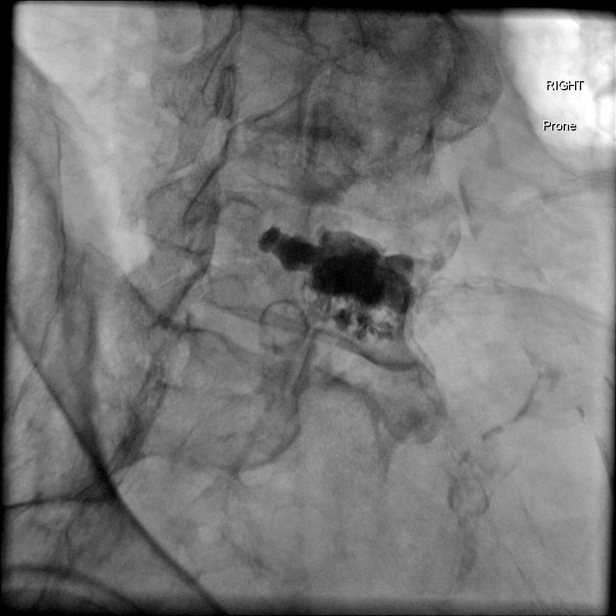

[Series 21: single · 1 of 2 slices shown (4 of 4)]
[im 2/2]
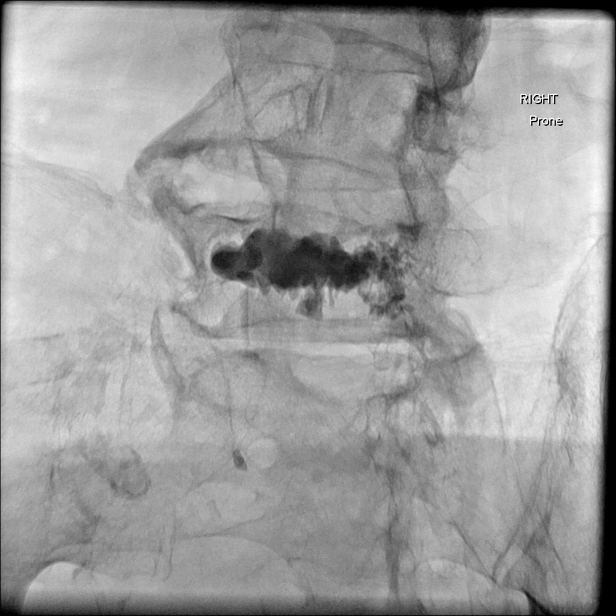

[13 of 21 positions shown; findings below may reference images not displayed]

PET-CT - 08/25/2017

MEDICATIONS:
As antibiotic prophylaxis, Ancef 2 gm IV was ordered pre-procedure
and administered intravenously within 1 hour of incision.

ANESTHESIA/SEDATION:
Moderate (conscious) sedation was employed during this procedure. A
total of Versed 4 mg and Fentanyl 200 mcg was administered
intravenously.

Moderate Sedation Time: 50 minutes. The patient's level of
consciousness and vital signs were monitored continuously by
radiology nursing throughout the procedure under my direct
supervision.

FLUOROSCOPY TIME:  11 Min, 54 seconds (5784 mGy)

COMPLICATIONS:
None immediate.
The patient was placed prone on the fluoroscopic table. The skin
overlying the lower lumbar region was then prepped and draped in the
usual sterile fashion. Maximal barrier sterile technique was
utilized including caps, mask, sterile gowns, sterile gloves,
sterile drape, hand hygiene and skin antiseptic. Intravenous
Fentanyl and Versed were administered as conscious sedation during
continuous cardiorespiratory monitoring by the radiology RN.

The left pedicle at L5 was then infiltrated with 1% lidocaine
followed by the advancement of a Kyphon trocar needle through the
left pedicle into the posterior one-third of the vertebral body. The
trocar was removed and a bone biopsy was obtained at this location.
Subsequently, the osteo drill was advanced to the anterior third of
the vertebral body. The osteo drill was retracted. Through the
working cannula, a Kyphon inflatable bone tamp 15 x 3 was advanced
and positioned with the distal marker approximately 5 mm from the
anterior aspect of the cortex. Appropriate positioning was confirmed
on the AP projection. At this time, the balloon was expanded using
contrast via a Kyphon inflation syringe device via micro tubing.

In similar fashion, the right L5 pedicle was infiltrated with 1%
lidocaine followed by the advancement of a second Kyphon trocar
needle through the right pedicle into the posterior third of the
vertebral body. Again, a bone biopsy was obtained at this location.
Subsequently, the osteo drill was coaxially advanced to the anterior
right third. The osteo drill was exchanged for a Kyphon inflatable
bone tamp 15 x 3, advanced to the 5 mm of the anterior aspect of the
cortex. The balloon was then expanded using contrast as above.

Inflations were continued until there was near apposition with the
superior end plate. At this time, methylmethacrylate mixture was
reconstituted in the Kyphon bone mixing device system. This was then
loaded into the delivery mechanism, attached to Kyphon bone fillers.

The balloons were deflated and removed followed by the instillation
of methylmethacrylate mixture with excellent filling in the AP and
lateral projections. No extravasation was noted in the disk spaces
or posteriorly into the spinal canal. No epidural venous
contamination was seen.

The working cannulae and the bone filler were then retrieved and
removed. Hemostasis was achieved with manual compression. The
patient tolerated the procedure well without immediate
postprocedural complication.
IMPRESSION: 1. Technically successful L5 vertebral body augmentation using
balloon kyphoplasty.
2. Technically successful L5 vertebral body biopsy.
